# Patient Record
Sex: Male | Born: 1970
Health system: Southern US, Community
[De-identification: ages and names within clinical notes are randomized; demographics above are authoritative.]

## PROBLEM LIST (undated history)

## (undated) DIAGNOSIS — F1910 Other psychoactive substance abuse, uncomplicated: Secondary | ICD-10-CM

## (undated) DIAGNOSIS — Z91199 Patient's noncompliance with other medical treatment and regimen due to unspecified reason: Secondary | ICD-10-CM

## (undated) DIAGNOSIS — I428 Other cardiomyopathies: Secondary | ICD-10-CM

## (undated) DIAGNOSIS — E785 Hyperlipidemia, unspecified: Secondary | ICD-10-CM

## (undated) DIAGNOSIS — I10 Essential (primary) hypertension: Secondary | ICD-10-CM

## (undated) DIAGNOSIS — I2510 Atherosclerotic heart disease of native coronary artery without angina pectoris: Secondary | ICD-10-CM

## (undated) DIAGNOSIS — Z9119 Patient's noncompliance with other medical treatment and regimen: Secondary | ICD-10-CM

## (undated) DIAGNOSIS — I5022 Chronic systolic (congestive) heart failure: Secondary | ICD-10-CM

## (undated) DIAGNOSIS — I509 Heart failure, unspecified: Secondary | ICD-10-CM

## (undated) DIAGNOSIS — I639 Cerebral infarction, unspecified: Secondary | ICD-10-CM

## (undated) DIAGNOSIS — G40909 Epilepsy, unspecified, not intractable, without status epilepticus: Secondary | ICD-10-CM

## (undated) DIAGNOSIS — Z87828 Personal history of other (healed) physical injury and trauma: Secondary | ICD-10-CM

## (undated) HISTORY — DX: Morbid (severe) obesity due to excess calories: E66.01

## (undated) HISTORY — PX: CARDIAC CATHETERIZATION: SHX172

## (undated) HISTORY — DX: Cerebral infarction, unspecified: I63.9

## (undated) HISTORY — DX: Personal history of other (healed) physical injury and trauma: Z87.828

## (undated) HISTORY — DX: Chronic systolic (congestive) heart failure: I50.22

## (undated) HISTORY — DX: Hyperlipidemia, unspecified: E78.5

## (undated) HISTORY — DX: Epilepsy, unspecified, not intractable, without status epilepticus: G40.909

## (undated) HISTORY — DX: Essential (primary) hypertension: I10

## (undated) HISTORY — DX: Heart failure, unspecified: I50.9

---

## 2005-03-24 ENCOUNTER — Emergency Department (HOSPITAL_COMMUNITY): Admission: EM | Admit: 2005-03-24 | Discharge: 2005-03-24 | Payer: Self-pay | Admitting: Emergency Medicine

## 2008-12-18 ENCOUNTER — Emergency Department: Payer: Self-pay | Admitting: Unknown Physician Specialty

## 2012-10-13 ENCOUNTER — Inpatient Hospital Stay: Payer: Self-pay | Admitting: Internal Medicine

## 2012-10-13 LAB — DRUG SCREEN, URINE
Amphetamines, Ur Screen: NEGATIVE (ref ?–1000)
Benzodiazepine, Ur Scrn: NEGATIVE (ref ?–200)
Cocaine Metabolite,Ur ~~LOC~~: NEGATIVE (ref ?–300)
Opiate, Ur Screen: NEGATIVE (ref ?–300)
Phencyclidine (PCP) Ur S: NEGATIVE (ref ?–25)

## 2012-10-13 LAB — CBC
HCT: 41.6 % (ref 40.0–52.0)
MCH: 30.3 pg (ref 26.0–34.0)
MCHC: 32.2 g/dL (ref 32.0–36.0)
MCV: 94 fL (ref 80–100)
Platelet: 226 10*3/uL (ref 150–440)
RBC: 4.42 10*6/uL (ref 4.40–5.90)
RDW: 15.5 % — ABNORMAL HIGH (ref 11.5–14.5)
WBC: 8.1 10*3/uL (ref 3.8–10.6)

## 2012-10-13 LAB — COMPREHENSIVE METABOLIC PANEL
BUN: 25 mg/dL — ABNORMAL HIGH (ref 7–18)
Bilirubin,Total: 0.6 mg/dL (ref 0.2–1.0)
Calcium, Total: 7.9 mg/dL — ABNORMAL LOW (ref 8.5–10.1)
Co2: 28 mmol/L (ref 21–32)
EGFR (African American): 48 — ABNORMAL LOW
Glucose: 185 mg/dL — ABNORMAL HIGH (ref 65–99)
Osmolality: 289 (ref 275–301)
SGPT (ALT): 55 U/L (ref 12–78)

## 2012-10-13 LAB — TROPONIN I
Troponin-I: 0.09 ng/mL — ABNORMAL HIGH
Troponin-I: 0.1 ng/mL — ABNORMAL HIGH

## 2012-10-13 LAB — CK TOTAL AND CKMB (NOT AT ARMC)
CK, Total: 381 U/L — ABNORMAL HIGH (ref 35–232)
CK-MB: 7.7 ng/mL — ABNORMAL HIGH (ref 0.5–3.6)

## 2012-10-13 LAB — PRO B NATRIURETIC PEPTIDE: B-Type Natriuretic Peptide: 2366 pg/mL — ABNORMAL HIGH (ref 0–125)

## 2012-10-13 LAB — CK-MB: CK-MB: 8.9 ng/mL — ABNORMAL HIGH (ref 0.5–3.6)

## 2012-10-13 LAB — TSH: Thyroid Stimulating Horm: 2.08 u[IU]/mL

## 2012-10-13 LAB — HEMOGLOBIN A1C: Hemoglobin A1C: 6.7 % — ABNORMAL HIGH (ref 4.2–6.3)

## 2012-10-13 LAB — CK: CK, Total: 393 U/L — ABNORMAL HIGH (ref 35–232)

## 2012-10-14 LAB — URINALYSIS, COMPLETE
Bacteria: NONE SEEN
Bilirubin,UR: NEGATIVE
Blood: NEGATIVE
Hyaline Cast: 3
Ketone: NEGATIVE
Leukocyte Esterase: NEGATIVE
Ph: 5 (ref 4.5–8.0)
Protein: NEGATIVE
RBC,UR: NONE SEEN /HPF (ref 0–5)
Specific Gravity: 1.015 (ref 1.003–1.030)
WBC UR: <1 /HPF (ref 0–5)

## 2012-10-14 LAB — TROPONIN I: Troponin-I: 0.11 ng/mL — ABNORMAL HIGH

## 2012-10-14 LAB — CBC WITH DIFFERENTIAL/PLATELET
Basophil #: 0.1 10*3/uL (ref 0.0–0.1)
Basophil %: 0.7 %
Eosinophil #: 0.4 10*3/uL (ref 0.0–0.7)
Eosinophil %: 4.2 %
HCT: 42.3 % (ref 40.0–52.0)
HGB: 13.8 g/dL (ref 13.0–18.0)
Lymphocyte #: 1.5 10*3/uL (ref 1.0–3.6)
MCH: 30.7 pg (ref 26.0–34.0)
MCHC: 32.6 g/dL (ref 32.0–36.0)
MCV: 94 fL (ref 80–100)
Monocyte #: 1.2 x10 3/mm — ABNORMAL HIGH (ref 0.2–1.0)
Monocyte %: 13.2 %
RBC: 4.49 10*6/uL (ref 4.40–5.90)
RDW: 15.2 % — ABNORMAL HIGH (ref 11.5–14.5)
WBC: 8.8 10*3/uL (ref 3.8–10.6)

## 2012-10-14 LAB — BASIC METABOLIC PANEL
Anion Gap: 4 — ABNORMAL LOW (ref 7–16)
Chloride: 106 mmol/L (ref 98–107)
Glucose: 105 mg/dL — ABNORMAL HIGH (ref 65–99)
Osmolality: 286 (ref 275–301)
Potassium: 4 mmol/L (ref 3.5–5.1)
Sodium: 141 mmol/L (ref 136–145)

## 2012-10-14 LAB — LIPID PANEL
HDL Cholesterol: 44 mg/dL (ref 40–60)
Ldl Cholesterol, Calc: 123 mg/dL — ABNORMAL HIGH (ref 0–100)

## 2012-10-14 LAB — PROTEIN / CREATININE RATIO, URINE: Protein/Creat. Ratio: 77 mg/gCREAT (ref 0–200)

## 2012-10-15 LAB — PHOSPHORUS: Phosphorus: 5 mg/dL — ABNORMAL HIGH (ref 2.5–4.9)

## 2012-10-15 LAB — PROTIME-INR
INR: 1.1
Prothrombin Time: 14.6 secs (ref 11.5–14.7)

## 2013-01-27 DIAGNOSIS — E119 Type 2 diabetes mellitus without complications: Secondary | ICD-10-CM | POA: Insufficient documentation

## 2013-01-27 DIAGNOSIS — Z8673 Personal history of transient ischemic attack (TIA), and cerebral infarction without residual deficits: Secondary | ICD-10-CM | POA: Insufficient documentation

## 2013-06-19 ENCOUNTER — Emergency Department: Payer: Self-pay | Admitting: Emergency Medicine

## 2013-10-12 ENCOUNTER — Encounter: Payer: Self-pay | Admitting: *Deleted

## 2013-10-21 ENCOUNTER — Ambulatory Visit: Payer: Self-pay | Admitting: Cardiovascular Disease

## 2013-11-03 ENCOUNTER — Encounter: Payer: Self-pay | Admitting: *Deleted

## 2013-11-04 ENCOUNTER — Encounter: Payer: Self-pay | Admitting: Cardiovascular Disease

## 2013-11-04 ENCOUNTER — Ambulatory Visit (INDEPENDENT_AMBULATORY_CARE_PROVIDER_SITE_OTHER): Payer: PRIVATE HEALTH INSURANCE | Admitting: Cardiovascular Disease

## 2013-11-04 VITALS — BP 128/98 | HR 87 | Ht 72.0 in | Wt 326.0 lb

## 2013-11-04 DIAGNOSIS — R0600 Dyspnea, unspecified: Secondary | ICD-10-CM

## 2013-11-04 DIAGNOSIS — R079 Chest pain, unspecified: Secondary | ICD-10-CM

## 2013-11-04 DIAGNOSIS — I5022 Chronic systolic (congestive) heart failure: Secondary | ICD-10-CM

## 2013-11-04 DIAGNOSIS — Z7189 Other specified counseling: Secondary | ICD-10-CM

## 2013-11-04 DIAGNOSIS — R0989 Other specified symptoms and signs involving the circulatory and respiratory systems: Secondary | ICD-10-CM

## 2013-11-04 DIAGNOSIS — I10 Essential (primary) hypertension: Secondary | ICD-10-CM

## 2013-11-04 DIAGNOSIS — I509 Heart failure, unspecified: Secondary | ICD-10-CM

## 2013-11-04 DIAGNOSIS — Z7689 Persons encountering health services in other specified circumstances: Secondary | ICD-10-CM

## 2013-11-04 DIAGNOSIS — R0609 Other forms of dyspnea: Secondary | ICD-10-CM

## 2013-11-04 DIAGNOSIS — E785 Hyperlipidemia, unspecified: Secondary | ICD-10-CM

## 2013-11-04 DIAGNOSIS — G4730 Sleep apnea, unspecified: Secondary | ICD-10-CM

## 2013-11-04 MED ORDER — LOVASTATIN 20 MG PO TABS: 20.0000 mg | ORAL_TABLET | Freq: Every day | ORAL | Status: DC

## 2013-11-04 MED ORDER — ENALAPRIL MALEATE 2.5 MG PO TABS: 2.5000 mg | ORAL_TABLET | Freq: Every day | ORAL | Status: DC

## 2013-11-04 MED ORDER — HYDRALAZINE HCL 10 MG PO TABS: 10.0000 mg | ORAL_TABLET | Freq: Three times a day (TID) | ORAL | Status: DC

## 2013-11-04 MED ORDER — FUROSEMIDE 40 MG PO TABS: 40.0000 mg | ORAL_TABLET | Freq: Every day | ORAL | Status: DC

## 2013-11-04 MED ORDER — CARVEDILOL 6.25 MG PO TABS: 6.2500 mg | ORAL_TABLET | Freq: Two times a day (BID) | ORAL | Status: DC

## 2013-11-04 MED ORDER — NITROGLYCERIN 0.4 MG SL SUBL: 0.4000 mg | SUBLINGUAL_TABLET | SUBLINGUAL | Status: DC | PRN

## 2013-11-04 NOTE — Patient Instructions (Signed)
Your physician recommends that you have lab work today:  TSH  CBC BMP  Liver Profile   Your physician has requested that you have an echocardiogram. Echocardiography is a painless test that uses sound waves to create images of your heart. It provides your doctor with information about the size and shape of your heart and how well your heart's chambers and valves are working. This procedure takes approximately one hour. There are no restrictions for this procedure.  Your physician recommends that you schedule a follow-up appointment in:  1 month   You have been referred to [facility] for a sleep study.   You have been referred to Imogene Burn NP for primary care.

## 2013-11-05 ENCOUNTER — Encounter: Payer: Self-pay | Admitting: Cardiovascular Disease

## 2013-11-05 DIAGNOSIS — I10 Essential (primary) hypertension: Secondary | ICD-10-CM | POA: Insufficient documentation

## 2013-11-05 DIAGNOSIS — I5022 Chronic systolic (congestive) heart failure: Secondary | ICD-10-CM | POA: Insufficient documentation

## 2013-11-05 DIAGNOSIS — E1169 Type 2 diabetes mellitus with other specified complication: Secondary | ICD-10-CM | POA: Insufficient documentation

## 2013-11-05 DIAGNOSIS — E785 Hyperlipidemia, unspecified: Secondary | ICD-10-CM

## 2013-11-05 DIAGNOSIS — G4733 Obstructive sleep apnea (adult) (pediatric): Secondary | ICD-10-CM | POA: Insufficient documentation

## 2013-11-05 LAB — CBC WITH DIFFERENTIAL
Basophils Absolute: 0 10*3/uL (ref 0.0–0.2)
Basos: 0 %
Eos: 5 %
Eosinophils Absolute: 0.3 10*3/uL (ref 0.0–0.4)
HEMATOCRIT: 40.1 % (ref 37.5–51.0)
Hemoglobin: 13.7 g/dL (ref 12.6–17.7)
IMMATURE GRANULOCYTES: 0 %
Immature Grans (Abs): 0 10*3/uL (ref 0.0–0.1)
LYMPHS: 22 %
Lymphocytes Absolute: 1.6 10*3/uL (ref 0.7–3.1)
MCH: 32.3 pg (ref 26.6–33.0)
MCHC: 34.2 g/dL (ref 31.5–35.7)
MCV: 95 fL (ref 79–97)
MONOCYTES: 12 %
Monocytes Absolute: 0.9 10*3/uL (ref 0.1–0.9)
NEUTROS ABS: 4.6 10*3/uL (ref 1.4–7.0)
Neutrophils Relative %: 61 %
Platelets: 266 10*3/uL (ref 150–379)
RBC: 4.24 x10E6/uL (ref 4.14–5.80)
RDW: 15.1 % (ref 12.3–15.4)
WBC: 7.4 10*3/uL (ref 3.4–10.8)

## 2013-11-05 LAB — BASIC METABOLIC PANEL
BUN/Creatinine Ratio: 12 (ref 9–20)
BUN: 24 mg/dL (ref 6–24)
CHLORIDE: 100 mmol/L (ref 97–108)
CO2: 25 mmol/L (ref 18–29)
CREATININE: 1.99 mg/dL — AB (ref 0.76–1.27)
Calcium: 9 mg/dL (ref 8.7–10.2)
GFR calc non Af Amer: 40 mL/min/{1.73_m2} — ABNORMAL LOW (ref >59)
GFR, EST AFRICAN AMERICAN: 46 mL/min/{1.73_m2} — AB (ref >59)
GLUCOSE: 102 mg/dL — AB (ref 65–99)
Potassium: 4.4 mmol/L (ref 3.5–5.2)
Sodium: 141 mmol/L (ref 134–144)

## 2013-11-05 LAB — HEPATIC FUNCTION PANEL
ALBUMIN: 4 g/dL (ref 3.5–5.5)
ALK PHOS: 47 IU/L (ref 39–117)
ALT: 28 IU/L (ref 0–44)
AST: 34 IU/L (ref 0–40)
Bilirubin, Direct: 0.12 mg/dL (ref 0.00–0.40)
TOTAL PROTEIN: 6.7 g/dL (ref 6.0–8.5)
Total Bilirubin: 0.5 mg/dL (ref 0.0–1.2)

## 2013-11-05 LAB — TSH: TSH: 2.91 u[IU]/mL (ref 0.450–4.500)

## 2013-11-05 NOTE — Assessment & Plan Note (Signed)
He is currently on lovastatin. Check liver profile.

## 2013-11-05 NOTE — Assessment & Plan Note (Signed)
He might be slightly fluid overloaded. He has not had any recent labs. Thus, I requested routine labs today especially to check his kidney function before making any adjustments in his medications. I also requested an echocardiogram to evaluate LV systolic function. If ejection fraction is less than 35%, we should consider ICD placement given that he has been on medical therapy for about a year. Etiology of his cardiomyopathy is likely hypertensive heart disease. However, he will require ischemic evaluation as well.

## 2013-11-05 NOTE — Assessment & Plan Note (Signed)
Blood pressure is well controlled on current medications. 

## 2013-11-05 NOTE — Progress Notes (Signed)
HPI  This is a 43 year old African American male who is here to establish cardiovascular care. Recent obtained health insurance. He was hospitalized at [facility] in April of 2014 with congestive heart failure. He was found to have an ejection fraction of less than 20%. He was treated medically with improvement in symptoms. He reports history of a seizure disorder since he was 14, hypertension and likely sleep apnea. He is obese and has chronic kidney disease. He quit smoking in 2014. He drinks red wine very rarely. He has used marijuana and cocaine in the past but none recently He complains of chronic exertional dyspnea and orthopnea. Recently, he started having shoulder discomfort and he has been using ibuprofen frequently. He occasionally has substernal chest tightness. His girlfriend reports frequent apnea episodes at night.  No Known Allergies   No current outpatient prescriptions on file prior to visit.   No current facility-administered medications on file prior to visit.     Past Medical History  Diagnosis Date  . Seizure disorder   . History of traumatic injury of head   . Stroke   . Chronic kidney disease   . Irregular heart beat   . CHF (congestive heart failure)   . Chronic systolic heart failure April of 2014    Ejection fraction less than 20%  . Hyperlipidemia   . Hypertension   . Obesity      History reviewed. No pertinent past surgical history.   Family History  Problem Relation Age of Onset  . Diabetes Mother   . Brain cancer Mother   . Heart attack Mother   . Hypertension Father   . Diabetes Father   . Heart disease Father   . Hyperlipidemia Father   . Heart attack Father   . Heart failure Father      History   Social History  . Marital Status: Divorced    Spouse Name: N/A    Number of Children: N/A  . Years of Education: N/A   Occupational History  . Not on file.   Social History Main Topics  . Smoking status: Former Smoker -- 10 years   Types: Cigarettes  . Smokeless tobacco: Not on file  . Alcohol Use: Yes     Comment: socially  . Drug Use: No  . Sexual Activity: Not on file   Other Topics Concern  . Not on file   Social History Narrative  . No narrative on file     ROS A 10 point review of system was performed. It is negative other than that mentioned in the history of present illness.   PHYSICAL EXAM   BP 128/98  Pulse 87  Ht 6' (1.829 m)  Wt 326 lb (147.873 kg)  BMI 44.20 kg/m2 Constitutional: He is oriented to person, place, and time. He appears well-developed and well-nourished. No distress.  HENT: No nasal discharge.  Head: Normocephalic and atraumatic.  Eyes: Pupils are equal and round.  No discharge. Neck: Normal range of motion. Neck supple. No JVD present. No thyromegaly present.  Cardiovascular: Normal rate, regular rhythm, normal heart sounds. Exam reveals no gallop and no friction rub. No murmur heard.  Pulmonary/Chest: Effort normal and breath sounds normal. No stridor. No respiratory distress. He has no wheezes. He has no rales. He exhibits no tenderness.  Abdominal: Soft. Bowel sounds are normal. He exhibits no distension. There is no tenderness. There is no rebound and no guarding.  Musculoskeletal: Normal range of motion. He exhibits no edema and  no tenderness.  Neurological: He is alert and oriented to person, place, and time. Coordination normal.  Skin: Skin is warm and dry. No rash noted. He is not diaphoretic. No erythema. No pallor.  Psychiatric: He has a normal mood and affect. His behavior is normal. Judgment and thought content normal.       [id number]  Rhythm  -  Nonspecific T-abnormality.   ABNORMAL    ASSESSMENT AND PLAN

## 2013-11-05 NOTE — Assessment & Plan Note (Signed)
He has classic symptoms of obstructive sleep apnea which is likely contributing to his heart failure and hypotension. I am referring him to Dr. Freda Munro for evaluation.

## 2013-11-07 ENCOUNTER — Telehealth: Payer: Self-pay | Admitting: *Deleted

## 2013-11-07 DIAGNOSIS — N289 Disorder of kidney and ureter, unspecified: Secondary | ICD-10-CM

## 2013-11-07 NOTE — Telephone Encounter (Signed)
Message copied by Fransico Setters on Mon Nov 07, 2013  9:07 AM ------      Message from: Lorine Bears A      Created: Sat Nov 05, 2013  9:45 AM       Inform patient that labs were normal except for kidney failure. Refer to Dr. Cherylann Ratel for evaluation. ------

## 2013-11-08 ENCOUNTER — Telehealth: Payer: Self-pay | Admitting: *Deleted

## 2013-11-08 NOTE — Telephone Encounter (Signed)
Left voice mail with patient appt info  Asked patient to call and confirm

## 2013-11-08 NOTE — Telephone Encounter (Signed)
Attempted to call patient to inform him that his appt with Dr.Saadat Welton Flakes for sleep study is 11/10/13 at 10:30 No Answer  No voicemail

## 2013-11-09 NOTE — Telephone Encounter (Signed)
LVM 5/6

## 2013-11-09 NOTE — Telephone Encounter (Signed)
 [facility] recd referral and info  They are trying to contact patient to set up appt

## 2013-11-11 NOTE — Telephone Encounter (Signed)
Left voicemail asking patient to call and let us know if he went to his sleep study appt with Dr. Welton Flakes on 5/7

## 2013-11-17 ENCOUNTER — Other Ambulatory Visit: Payer: PRIVATE HEALTH INSURANCE

## 2013-11-24 ENCOUNTER — Ambulatory Visit: Payer: PRIVATE HEALTH INSURANCE | Admitting: Adult Health

## 2013-12-02 ENCOUNTER — Telehealth: Payer: Self-pay | Admitting: *Deleted

## 2013-12-02 NOTE — Telephone Encounter (Signed)
LVM 5/29 

## 2013-12-08 ENCOUNTER — Ambulatory Visit: Payer: PRIVATE HEALTH INSURANCE | Admitting: Cardiovascular Disease

## 2013-12-15 ENCOUNTER — Encounter: Payer: Self-pay | Admitting: *Deleted

## 2013-12-15 ENCOUNTER — Ambulatory Visit: Payer: PRIVATE HEALTH INSURANCE | Admitting: Cardiovascular Disease

## 2014-01-13 ENCOUNTER — Other Ambulatory Visit: Payer: Self-pay

## 2014-01-13 MED ORDER — FUROSEMIDE 40 MG PO TABS: 40.0000 mg | ORAL_TABLET | Freq: Every day | ORAL | Status: DC

## 2014-01-13 MED ORDER — HYDRALAZINE HCL 10 MG PO TABS: 10.0000 mg | ORAL_TABLET | Freq: Three times a day (TID) | ORAL | Status: DC

## 2014-01-13 MED ORDER — LOVASTATIN 20 MG PO TABS: 20.0000 mg | ORAL_TABLET | Freq: Every day | ORAL | Status: DC

## 2014-01-13 MED ORDER — ENALAPRIL MALEATE 2.5 MG PO TABS: 2.5000 mg | ORAL_TABLET | Freq: Every day | ORAL | Status: DC

## 2014-02-20 ENCOUNTER — Other Ambulatory Visit: Payer: Self-pay

## 2014-02-20 MED ORDER — CARVEDILOL 6.25 MG PO TABS: 6.2500 mg | ORAL_TABLET | Freq: Two times a day (BID) | ORAL | Status: DC

## 2014-03-02 ENCOUNTER — Encounter: Payer: Self-pay | Admitting: *Deleted

## 2014-03-02 ENCOUNTER — Ambulatory Visit: Payer: PRIVATE HEALTH INSURANCE | Admitting: Cardiovascular Disease

## 2014-03-20 ENCOUNTER — Emergency Department: Payer: Self-pay | Admitting: Emergency Medicine

## 2014-05-26 ENCOUNTER — Other Ambulatory Visit: Payer: Self-pay

## 2014-06-21 ENCOUNTER — Emergency Department: Payer: Self-pay | Admitting: Emergency Medicine

## 2014-06-21 LAB — PROTIME-INR
INR: 1.1
Prothrombin Time: 13.6 secs (ref 11.5–14.7)

## 2014-06-21 LAB — CBC WITH DIFFERENTIAL/PLATELET
BASOS ABS: 0 10*3/uL (ref 0.0–0.1)
BASOS PCT: 0.5 %
Eosinophil #: 0.3 10*3/uL (ref 0.0–0.7)
Eosinophil %: 3.1 %
HCT: 43.5 % (ref 40.0–52.0)
HGB: 14.3 g/dL (ref 13.0–18.0)
LYMPHS ABS: 1.4 10*3/uL (ref 1.0–3.6)
Lymphocyte %: 14.9 %
MCH: 32.2 pg (ref 26.0–34.0)
MCHC: 32.7 g/dL (ref 32.0–36.0)
MCV: 98 fL (ref 80–100)
MONO ABS: 0.9 x10 3/mm (ref 0.2–1.0)
Monocyte %: 9.3 %
Neutrophil #: 7 10*3/uL — ABNORMAL HIGH (ref 1.4–6.5)
Neutrophil %: 72.2 %
Platelet: 219 10*3/uL (ref 150–440)
RBC: 4.42 10*6/uL (ref 4.40–5.90)
RDW: 15.2 % — ABNORMAL HIGH (ref 11.5–14.5)
WBC: 9.7 10*3/uL (ref 3.8–10.6)

## 2014-06-21 LAB — COMPREHENSIVE METABOLIC PANEL
ALK PHOS: 55 U/L
AST: 42 U/L — AB (ref 15–37)
Albumin: 3.4 g/dL (ref 3.4–5.0)
Anion Gap: 3 — ABNORMAL LOW (ref 7–16)
BUN: 23 mg/dL — AB (ref 7–18)
Bilirubin,Total: 0.7 mg/dL (ref 0.2–1.0)
CO2: 32 mmol/L (ref 21–32)
CREATININE: 1.99 mg/dL — AB (ref 0.60–1.30)
Calcium, Total: 8.7 mg/dL (ref 8.5–10.1)
Chloride: 103 mmol/L (ref 98–107)
EGFR (African American): 47 — ABNORMAL LOW
GFR CALC NON AF AMER: 39 — AB
Glucose: 92 mg/dL (ref 65–99)
Osmolality: 279 (ref 275–301)
Potassium: 4.2 mmol/L (ref 3.5–5.1)
SGPT (ALT): 36 U/L
SODIUM: 138 mmol/L (ref 136–145)
Total Protein: 7.5 g/dL (ref 6.4–8.2)

## 2014-06-21 LAB — TROPONIN I
TROPONIN-I: 0.14 ng/mL — AB
Troponin-I: 0.13 ng/mL — ABNORMAL HIGH

## 2014-06-21 LAB — APTT: Activated PTT: 28.1 secs (ref 23.6–35.9)

## 2014-08-30 ENCOUNTER — Inpatient Hospital Stay: Payer: Self-pay | Admitting: Internal Medicine

## 2014-08-30 ENCOUNTER — Telehealth: Payer: Self-pay | Admitting: *Deleted

## 2014-08-30 ENCOUNTER — Emergency Department: Payer: Self-pay | Admitting: Emergency Medicine

## 2014-08-30 NOTE — Telephone Encounter (Signed)
I spoke with the patient He states that around 4 pm yesterday he started to have sharp pain in his left arm, dizziness, and vomiting. He was at work and left there. He went home and took an ASA. He developed arm numbness and tingling to his fingers. His pain gradually eased off until it resolved around 6:30/ 7:00 pm. He reports that his symptoms started again around 4 am with the left arm pain and some dizziness. He reports he has been to the ER several times recently and they just give him and ASA. No documented history of MI, but his last documented EF was ~ 20% in April 2014. He was scheduled to have a repeat echo in May 2015, but this was cancelled and not rescheduled. I asked the patient if he has had any recent drug use. He states he last used Marijuana on Superbowl Sunday. He has started smoking again sometime last year. He has a history of seizures and feels that he does not have as many when he smokes. Last seizure was in December 2015.He reports some alcohol use. I have advised the patient that based on his current symptoms, he should report to the ER for immediate evaluation. I have advised if he is not admitted, he should call us back and schedule a follow up with Dr. Kirke Corin and he needs a repeat echo to evaluate his EF. He verbalizes understanding.

## 2014-08-30 NOTE — Telephone Encounter (Signed)
Pt is calling stating that yesterday after work that he had tingling in fingers, left arm had sharp pains. Could not even lift it.  Dizzy and SOB. Has taken all med's but is worried that may need to go ED This morning seems to be coming back. n Needs to know if he should go ED. Please advise.

## 2014-08-31 ENCOUNTER — Other Ambulatory Visit: Payer: Self-pay | Admitting: Cardiovascular Disease

## 2014-08-31 DIAGNOSIS — R7989 Other specified abnormal findings of blood chemistry: Secondary | ICD-10-CM

## 2014-08-31 DIAGNOSIS — I340 Nonrheumatic mitral (valve) insufficiency: Secondary | ICD-10-CM

## 2014-08-31 DIAGNOSIS — I5022 Chronic systolic (congestive) heart failure: Secondary | ICD-10-CM

## 2014-08-31 DIAGNOSIS — I10 Essential (primary) hypertension: Secondary | ICD-10-CM

## 2014-09-01 ENCOUNTER — Telehealth: Payer: Self-pay | Admitting: *Deleted

## 2014-09-01 DIAGNOSIS — R079 Chest pain, unspecified: Secondary | ICD-10-CM

## 2014-09-01 NOTE — Telephone Encounter (Signed)
Dr.Gollan gave verbal orders to schedule a cardiac cath with Dr. Kirke Corin on 09/08/14 Patient scheduled for 09/08/14 at 0930 am  Patient to arrive at 0730 am for pre hydration  Dr. Mariah Milling informed of date and time so he can inform inpatient  Dr. Mariah Milling to review instructions with patient  No labs or cxr needed per Dr. Mariah Milling

## 2014-09-07 NOTE — Telephone Encounter (Signed)
Cath orders faxed to cath lab  Angie conformed receipt   LVM to inform patient of date and time instructions

## 2014-09-08 ENCOUNTER — Ambulatory Visit: Payer: Self-pay | Admitting: Cardiovascular Disease

## 2014-09-08 DIAGNOSIS — R079 Chest pain, unspecified: Secondary | ICD-10-CM | POA: Diagnosis not present

## 2014-09-13 ENCOUNTER — Encounter: Payer: Self-pay | Admitting: Cardiovascular Disease

## 2014-09-15 ENCOUNTER — Encounter: Payer: Self-pay | Admitting: Cardiovascular Disease

## 2014-09-15 ENCOUNTER — Encounter: Payer: BLUE CROSS/BLUE SHIELD | Admitting: Cardiovascular Disease

## 2014-09-19 ENCOUNTER — Encounter: Payer: BLUE CROSS/BLUE SHIELD | Admitting: Cardiovascular Disease

## 2014-09-19 ENCOUNTER — Encounter: Payer: Self-pay | Admitting: *Deleted

## 2014-10-03 ENCOUNTER — Ambulatory Visit (INDEPENDENT_AMBULATORY_CARE_PROVIDER_SITE_OTHER): Payer: BLUE CROSS/BLUE SHIELD | Admitting: Cardiovascular Disease

## 2014-10-03 ENCOUNTER — Encounter: Payer: Self-pay | Admitting: Cardiovascular Disease

## 2014-10-03 VITALS — BP 114/80 | HR 84 | Ht 72.0 in | Wt 329.5 lb

## 2014-10-03 DIAGNOSIS — G4730 Sleep apnea, unspecified: Secondary | ICD-10-CM | POA: Diagnosis not present

## 2014-10-03 DIAGNOSIS — I10 Essential (primary) hypertension: Secondary | ICD-10-CM | POA: Diagnosis not present

## 2014-10-03 DIAGNOSIS — I5022 Chronic systolic (congestive) heart failure: Secondary | ICD-10-CM

## 2014-10-03 DIAGNOSIS — R079 Chest pain, unspecified: Secondary | ICD-10-CM

## 2014-10-03 MED ORDER — FUROSEMIDE 40 MG PO TABS: 40.0000 mg | ORAL_TABLET | Freq: Two times a day (BID) | ORAL | Status: DC

## 2014-10-03 MED ORDER — ASPIRIN EC 81 MG PO TBEC: 81.0000 mg | DELAYED_RELEASE_TABLET | Freq: Every day | ORAL | Status: DC

## 2014-10-03 MED ORDER — ENALAPRIL MALEATE 5 MG PO TABS: 5.0000 mg | ORAL_TABLET | Freq: Every day | ORAL | Status: DC

## 2014-10-03 NOTE — Assessment & Plan Note (Signed)
I suspect that he has significant sleep apnea which is likely contributing to his heart failure symptoms. I requested a sleep study.

## 2014-10-03 NOTE — Assessment & Plan Note (Signed)
This is due to nonischemic cardiomyopathy with most recent ejection fraction of 15%. He is currently New York Heart Association class III. LVEDP was significantly elevated during cardiac catheterization. Symptoms improved after increasing the dose of Lasix to 40 mg twice daily. I requested basic metabolic profile today. I stopped hydralazine and increase enalapril to 5 mg once daily. Spironolactone is not recommended due to chronic kidney disease. If renal function improves, this can be considered.

## 2014-10-03 NOTE — Assessment & Plan Note (Signed)
Blood pressure is controlled on current medications. 

## 2014-10-03 NOTE — Progress Notes (Signed)
HPI  This is a 44 year old African American male who is here today for a follow-up visit regarding chronic systolic heart failure due to nonischemic cardiomyopathy. He was hospitalized at [facility] in April of 2014 with congestive heart failure. He was found to have an ejection fraction of less than 20%. He was treated medically with improvement in symptoms. He reports history of a seizure disorder since he was 14, hypertension and likely sleep apnea. He is obese and has chronic kidney disease. He quit smoking in 2014. He drinks red wine very rarely. He has used marijuana and cocaine in the past but none recently He had recurrent presentation with chest pain. Thus, I proceeded with cardiac catheterization last month which showed minor luminal irregularities without evidence of obstructive coronary artery disease. Left ventricular angiography was not performed due to chronic kidney disease. Left ventricular end-diastolic pressure was 30 mmHg. He was noted to have significant apnea episodes during cardiac cath with bradycardia. I increased his dose of Lasix to 40 mg twice daily. He reports improvement in symptoms.   No Known Allergies   Current Outpatient Prescriptions on File Prior to Visit  Medication Sig Dispense Refill  . carvedilol (COREG) 6.25 MG tablet Take 1 tablet (6.25 mg total) by mouth 2 (two) times daily with a meal. 60 tablet 6  . enalapril (VASOTEC) 2.5 MG tablet Take 1 tablet (2.5 mg total) by mouth daily. 60 tablet 3  . furosemide (LASIX) 40 MG tablet Take 1 tablet (40 mg total) by mouth daily. (Patient taking differently: Take 40 mg by mouth 2 (two) times daily. ) 30 tablet 3  . hydrALAZINE (APRESOLINE) 10 MG tablet Take 1 tablet (10 mg total) by mouth 3 (three) times daily. 90 tablet 3  . lovastatin (MEVACOR) 20 MG tablet Take 1 tablet (20 mg total) by mouth at bedtime. 30 tablet 3   No current facility-administered medications on file prior to visit.     Past Medical History    Diagnosis Date  . Seizure disorder   . History of traumatic injury of head   . Stroke   . Chronic kidney disease   . Irregular heart beat   . CHF (congestive heart failure)   . Chronic systolic heart failure April of 2014    Ejection fraction less than 20%  . Hyperlipidemia   . Hypertension   . Obesity      Past Surgical History  Procedure Laterality Date  . Cardiac catheterization      [facility]     Family History  Problem Relation Age of Onset  . Diabetes Mother   . Brain cancer Mother   . Heart attack Mother   . Hypertension Father   . Diabetes Father   . Heart disease Father   . Hyperlipidemia Father   . Heart attack Father   . Heart failure Father      History   Social History  . Marital Status: Single    Spouse Name: N/A  . Number of Children: N/A  . Years of Education: N/A   Occupational History  . Not on file.   Social History Main Topics  . Smoking status: Former Smoker -- 10 years    Types: Cigarettes  . Smokeless tobacco: Not on file  . Alcohol Use: Yes     Comment: socially  . Drug Use: No  . Sexual Activity: Not on file   Other Topics Concern  . Not on file   Social History Narrative  ROS A 10 point review of system was performed. It is negative other than that mentioned in the history of present illness.   PHYSICAL EXAM   BP 114/80 mmHg  Pulse 84  Ht 6' (1.829 m)  Wt 329 lb 8 oz (149.46 kg)  BMI 44.68 kg/m2 Constitutional: He is oriented to person, place, and time. He appears well-developed and well-nourished. No distress.  HENT: No nasal discharge.  Head: Normocephalic and atraumatic.  Eyes: Pupils are equal and round.  No discharge. Neck: Normal range of motion. Neck supple. No JVD present. No thyromegaly present.  Cardiovascular: Normal rate, regular rhythm, normal heart sounds. Exam reveals no gallop and no friction rub. No murmur heard.  Pulmonary/Chest: Effort normal and breath sounds normal. No stridor. No  respiratory distress. He has no wheezes. He has no rales. He exhibits no tenderness.  Abdominal: Soft. Bowel sounds are normal. He exhibits no distension. There is no tenderness. There is no rebound and no guarding.  Musculoskeletal: Normal range of motion. He exhibits no edema and no tenderness.  Neurological: He is alert and oriented to person, place, and time. Coordination normal.  Skin: Skin is warm and dry. No rash noted. He is not diaphoretic. No erythema. No pallor.  Psychiatric: He has a normal mood and affect. His behavior is normal. Judgment and thought content normal.  No groin hematoma. Radial pulses are normal bilaterally.     [id number]  Rhythm  -  Nonspecific T-abnormality.   ABNORMAL     ASSESSMENT AND PLAN

## 2014-10-03 NOTE — Patient Instructions (Signed)
Stop Hydralazine.  Increase Enalapril to 5 mg once daily.  Decrease Aspirin to 81 mg once daily.  Continue lasix 40 mg twice daily.   Labs today.   Order sleep study at [facility] to be read by Dr. Vassie Loll.   Follow up in 1 month.

## 2014-10-04 LAB — BASIC METABOLIC PANEL
BUN / CREAT RATIO: 11 (ref 9–20)
BUN: 19 mg/dL (ref 6–24)
CALCIUM: 9 mg/dL (ref 8.7–10.2)
CO2: 23 mmol/L (ref 18–29)
Chloride: 103 mmol/L (ref 97–108)
Creatinine, Ser: 1.69 mg/dL — ABNORMAL HIGH (ref 0.76–1.27)
GFR calc Af Amer: 55 mL/min/{1.73_m2} — ABNORMAL LOW (ref >59)
GFR calc non Af Amer: 48 mL/min/{1.73_m2} — ABNORMAL LOW (ref >59)
Glucose: 120 mg/dL — ABNORMAL HIGH (ref 65–99)
Potassium: 4.2 mmol/L (ref 3.5–5.2)
Sodium: 145 mmol/L — ABNORMAL HIGH (ref 134–144)

## 2014-10-16 ENCOUNTER — Encounter: Payer: Self-pay | Admitting: *Deleted

## 2014-10-25 ENCOUNTER — Telehealth: Payer: Self-pay

## 2014-10-25 MED ORDER — ENALAPRIL MALEATE 5 MG PO TABS
5.0000 mg | ORAL_TABLET | Freq: Every day | ORAL | Status: DC
Start: 2014-10-25 — End: 2014-10-27

## 2014-10-25 NOTE — Telephone Encounter (Signed)
Pt called back, will come in 10/26/2014 @2 :00

## 2014-10-25 NOTE — Telephone Encounter (Signed)
Yes, if it is in the afternoon. Am is no good.

## 2014-10-25 NOTE — Telephone Encounter (Signed)
Can have patient come over for a nurse visit. I can come assess the cath site when he arrives. Please let me know upon his arrival. He should follow up with PCP for stomach issues.

## 2014-10-25 NOTE — Telephone Encounter (Signed)
Pt called after receiving letter re his labs; reviewed w/ pt.  Pt reports that he needs a new rx for enalapril sent in, as he has been taking 5 mg BID instead of QD as instructed.  Reviewed pt's AVS w/ him and he verbalizes understanding. Pt states that at his last ov, he reported that his incision site was healing well, but he states that he feels that it is getting worse. Reports a sharp pain that will shoot through his stomach and almost double him over.  Denies any drainage, but he is unable to tell me if there is any heat or redness, as he cannot see the site due to his size. He would like to make Dr. Kirke Corin aware and see if he has any recommendations.

## 2014-10-25 NOTE — Telephone Encounter (Signed)
Spoke w/ pt.  Asked him to come in for nurse visit, but he states that he does not have a way to get here.  He would like to know if someone can see him if he is able to come over tomorrow.

## 2014-10-25 NOTE — Telephone Encounter (Signed)
The stomach is likely not related and should follow with his PCP about that.  The cath was more than a month ago but he should come for a nurse visit to check the cath site. With Alycia Rossetti is fine if available.

## 2014-10-25 NOTE — Telephone Encounter (Signed)
Attempted to contact pt.  No answer, no vm at either #.

## 2014-10-26 ENCOUNTER — Ambulatory Visit (INDEPENDENT_AMBULATORY_CARE_PROVIDER_SITE_OTHER): Payer: BLUE CROSS/BLUE SHIELD | Admitting: Physician Assistant

## 2014-10-26 VITALS — BP 122/84 | Ht 72.0 in | Wt 326.0 lb

## 2014-10-26 DIAGNOSIS — R1030 Lower abdominal pain, unspecified: Secondary | ICD-10-CM | POA: Diagnosis not present

## 2014-10-26 DIAGNOSIS — R1031 Right lower quadrant pain: Secondary | ICD-10-CM | POA: Diagnosis not present

## 2014-10-26 MED ORDER — HYDROCODONE-ACETAMINOPHEN 5-325 MG PO TABS: 1.0000 | ORAL_TABLET | Freq: Four times a day (QID) | ORAL | Status: DC | PRN

## 2014-10-26 NOTE — Progress Notes (Signed)
Cardiology Office Note:  Date of Encounter: 10/26/2014  ID: Kent Geralds., DOB 07-14-1970, MRN [id number]  PCP:  No PCP Per Patient Primary Cardiologist:  Dr. Kirke Corin, MD  Chief Complaint  Patient presents with  . other    Pt c/o swelling/ pain at lower abdomen.     HPI:  44 year old male with history of chronic systolic CHF 2/2 NICM. During prior hospitalization 10/2012 at [facility] he was found to have an EF of less than 20%. Treated medically with improvement in symptoms. He also notes history of seizure disorder since age 31, HTN, polysubstance abuse including prior cocaine abuse, MJ, tobacco (quit in 2014) and red wine, HTN, and possible sleep apnea.   He was hospitalized at [facility] 08/2014 with recurrent chest pain/ Troponin 0.13-->0.16-->0.13. Cardiac cath showed minor luminal irregularities without evidence of obstructive CAD. Left ventricular angiography was not performed due to chronic kidney disease. Left ventricular end-diastolic pressure was 30 mmHg. He was noted to have significant apnea episodes during cardiac cath with bradycardia. Echo 08/2014 showed EF <20%, mild LVH, diastolic dysfunction, severely dilated left atrium, moderately dilated right atrium, moderate mitral regurgitation, mild tricuspid regurgitation. Lasix was increased to 40 mg bid with improvement in symptoms.   He presents today stating he has intermittent suprapubic discomfort x 3 weeks. Afebrile. No chills. Discomfort is described as a sharp shooting pain. Better with movement. No associated nausea, vomiting, diarrhea. No chest pain, SOB, palpitations. Cath site without erythema, swelling, warmth, drainage, or tenderness.      Past Medical History  Diagnosis Date  . Seizure disorder   . History of traumatic injury of head   . Stroke   . Chronic kidney disease   . Irregular heart beat   . CHF (congestive heart failure)   . Chronic systolic heart failure April of 2014    Ejection fraction less than 20%    . Hyperlipidemia   . Hypertension   . Obesity   :  Past Surgical History  Procedure Laterality Date  . Cardiac catheterization      [facility]  :  Social History:  The patient  reports that he has quit smoking. His smoking use included Cigarettes. He quit after 10 years of use. He does not have any smokeless tobacco history on file. He reports that he drinks alcohol. He reports that he does not use illicit drugs.   Family History  Problem Relation Age of Onset  . Diabetes Mother   . Brain cancer Mother   . Heart attack Mother   . Hypertension Father   . Diabetes Father   . Heart disease Father   . Hyperlipidemia Father   . Heart attack Father   . Heart failure Father      Allergies:  No Known Allergies   Home Medications:  Current Outpatient Prescriptions  Medication Sig Dispense Refill  . aspirin EC 81 MG tablet Take 1 tablet (81 mg total) by mouth daily. 90 tablet 3  . carvedilol (COREG) 6.25 MG tablet Take 1 tablet (6.25 mg total) by mouth 2 (two) times daily with a meal. 60 tablet 6  . enalapril (VASOTEC) 5 MG tablet Take 1 tablet (5 mg total) by mouth daily. 30 tablet 6  . furosemide (LASIX) 40 MG tablet Take 1 tablet (40 mg total) by mouth 2 (two) times daily. 60 tablet 6  . HYDROcodone-acetaminophen (NORCO) 5-325 MG per tablet Take 1 tablet by mouth every 6 (six) hours as needed for moderate pain.  20 tablet 0  . lovastatin (MEVACOR) 20 MG tablet Take 1 tablet (20 mg total) by mouth at bedtime. 30 tablet 3   No current facility-administered medications for this visit.     Review of Systems:  Review of Systems  Constitutional: Negative for fever, chills, weight loss, malaise/fatigue and diaphoresis.  Eyes: Negative for blurred vision.  Respiratory: Negative for cough, hemoptysis, shortness of breath and wheezing.   Cardiovascular: Negative for chest pain, palpitations, orthopnea, claudication, leg swelling and PND.  Gastrointestinal: Positive for abdominal pain.  Negative for heartburn, nausea, vomiting, diarrhea and constipation.  Genitourinary: Negative for dysuria, urgency, frequency, hematuria and flank pain.  Musculoskeletal: Negative for myalgias.  Skin: Negative for itching and rash.  Neurological: Negative for weakness and headaches.     Physical Exam:  Blood pressure 122/84, height 6' (1.829 m), weight 326 lb (147.873 kg). Body mass index is 44.2 kg/(m^2). General: Pleasant, NAD Psych: Normal affect. Neuro: Alert and oriented X 3. Moves all extremities spontaneously. HEENT: Normal  Neck: Supple without JVD. Lungs:  Resp regular and unlabored. Heart: RRR no s3, s4, or murmurs. Abdomen: Abdomen is soft and not distended. BS x 4. He is TTP suprapubically without rebound. Negative McBurney's, Iliopsoas, and Rovsing's. Able to lay supine and return to seated position without any difficulty. Femoral pulse 2+. Cath site well healed without any erythema, swelling, warmth, TTP, or bruits. Compared to the contralateral side there does not appear to be any obvious swelling.   Extremities: No clubbing, cyanosis or edema. DP/PT/Radials 2+ and equal bilaterally.   Accessory Clinical Findings:  EKG - not performed today as this was a nurse visit for a groin check only  Other studies Reviewed: Additional studies/ records that were reviewed today include: [facility] notes.   Recent Labs: 11/04/2013: ALT 28; Hemoglobin 13.7; Platelets 266; TSH 2.910 10/03/2014: BUN 19; Creatinine 1.69*; Potassium 4.2; Sodium 145*    Weights: Wt Readings from Last 3 Encounters:  10/26/14 326 lb (147.873 kg)  10/03/14 329 lb 8 oz (149.46 kg)  11/04/13 326 lb (147.873 kg)     Assessment & Plan:  1. Abdominal pain: -Discomfort is along suprapubic region and appears to be of a possible soft tissue etiology vs GI vs urological  -External skin surface does not exhibit any signs of infection  -Cath site unremarkable, fully healed  -Check CBC -Follow up with PCP, names  and numbers were given -Scheduled appointment with new PCP for patient -At end of visit patient asked for pain medication, given his polysubstance abuse this could be a potential red flag, moving forward  -Norco 5/325 #20 1 po q 6 hours severe pain no RF, was given as a one time Rx     Dispo: -Regularly scheduled follow up with Dr. Kirke Corin, MD   Eula Listen, PA-C [facility] [address] [place], [place] [address] [phone] [facility] 10/26/2014, 3:06 PM

## 2014-10-26 NOTE — Patient Instructions (Addendum)
We will draw labs today: CBC  You are scheduled to see Dr. Carolin Coy [facility] [address] [place] [address] [phone] On May 6 @ 1:30  Call or return to clinic prn if these symptoms worsen or fail to improve as anticipated.

## 2014-10-27 ENCOUNTER — Other Ambulatory Visit: Payer: Self-pay | Admitting: *Deleted

## 2014-10-27 ENCOUNTER — Telehealth: Payer: Self-pay | Admitting: *Deleted

## 2014-10-27 LAB — CBC WITH DIFFERENTIAL/PLATELET
BASOS: 0 %
Basophils Absolute: 0 10*3/uL (ref 0.0–0.2)
EOS: 3 %
Eosinophils Absolute: 0.2 10*3/uL (ref 0.0–0.4)
HCT: 41.3 % (ref 37.5–51.0)
HEMOGLOBIN: 14.2 g/dL (ref 12.6–17.7)
IMMATURE GRANS (ABS): 0 10*3/uL (ref 0.0–0.1)
IMMATURE GRANULOCYTES: 0 %
Lymphocytes Absolute: 1.4 10*3/uL (ref 0.7–3.1)
Lymphs: 20 %
MCH: 32.4 pg (ref 26.6–33.0)
MCHC: 34.4 g/dL (ref 31.5–35.7)
MCV: 94 fL (ref 79–97)
Monocytes Absolute: 0.7 10*3/uL (ref 0.1–0.9)
Monocytes: 10 %
NEUTROS PCT: 67 %
Neutrophils Absolute: 4.6 10*3/uL (ref 1.4–7.0)
Platelets: 256 10*3/uL (ref 150–379)
RBC: 4.38 x10E6/uL (ref 4.14–5.80)
RDW: 15 % (ref 12.3–15.4)
WBC: 6.8 10*3/uL (ref 3.4–10.8)

## 2014-10-27 MED ORDER — ENALAPRIL MALEATE 5 MG PO TABS: 5.0000 mg | ORAL_TABLET | Freq: Every day | ORAL | Status: DC

## 2014-10-27 NOTE — Discharge Summary (Signed)
PATIENT NAME:  Kent Stanley, Kent Stanley MR#:  [id number] DATE OF BIRTH:  Nov 13, 1970  DATE OF ADMISSION:  10/13/2012 DATE OF DISCHARGE:  10/15/2012  PRESENTING COMPLAINT:  Leg swelling and shortness of breath.   DISCHARGE DIAGNOSES: 1.  Acute congestive heart failure, systolic with ejection fraction of less than 20%.  2.  Chronic seizure disorder. 3.  Right cerebellar acute cerebrovascular accident.  No neurologic deficits.  4.  Hyperglycemia/impaired glucose tolerance.  5.  Chronic kidney disease, stage II. 6.  Morbid obesity.   CODE STATUS:  FULL CODE.   MEDICATIONS: 1.  Enalapril 2.5 mg daily.  2.  Carbamazepine 200 mg twice a day.  3.  Lovastatin 20 mg daily.  4.  Aspirin 81 mg daily.  5.  Lasix 40 mg daily.  6.  Hydralazine 10 mg 3 times a day.  7.  Mesotrypsin. 8.  Triple antibiotic one application topically every other day.  9.  Coreg 6.25 b.i.d.  HOME OXYGEN:  None.   Low-sodium, low-fat, low-cholesterol, carbohydrate-controlled 1800 calorie diet.   FOLLOWUP: 1.  Follow up with Dr. Wynelle Link 11/05/2012. 2.  Follow up with Dr. Sherryll Burger 11/09/2012.  3.  Follow up with Dr. Darrold Junker 10/27/2012.  4.  Follow up with  [facility] on04/17/2014, primary care physician.  Phosphorus 5.0, PT-INR 14.6 and 1.1. Urine creatinine is 91.3 MRI. UA negative for UTI. Urine protein is negative.  CBC within normal limits.  Creatinine is 2.11.  LDL is 123. Cholesterol is 183. Troponin is 0.11, 0.10.  EKG shows normal sinus rhythm, left ventricular hypertrophy.   Chest x-ray consistent with CHF and pulmonary edema. Urine drug screen negative. B-type nitro peptide is 399. Hemoglobin A1c is 6.7. TSH is 2.08.  Ultrasound of the kidneys shows no evidence of obstruction of either kidney.  Echotexture on the right is mildly increased, reflects medical renal disease.  Echo Doppler showed EF of less than 20% severity global, decreased left ventricular systolic function, moderate to severely  increased left ventricular internal cavity size, mild dilated left atrium, mild dilated right atrium, moderate to severe MR, moderate to severe TR.  MRI of the brain shows small acute infarct in the right cerebellum.  PROCEDURE:  EEG negative for any epileptiform activity.  BRIEF SUMMARY OF HOSPITAL COURSE: The patient  is a 44 year old African American gentleman with past medical history of hypertension, comes in with: 1.  Acute congestive heart failure, systolic, with severe cardiomyopathy:  EF of 20% by echo. The patient presented with increasing shortness of breath and leg swelling. His CHF appears to be multifactorial, untreated hypertension, untreated suspected sleep apnea and significant alcohol abuse. The patient was started on IV Lasix. He diuresed well. He was changed to p.o. Lasix. He is on beta blockers. Low-dose Coreg was started, along with hydralazine and small dose of enalapril was started as well. The patient was seen by Dr. Darrold Junker.  His elevated troponin was likely demand ischemia. He will follow up with Dr. Darrold Junker as outpatient and discuss possibility of having AICD placement.  2.  Hypertension:  Low-dose Coreg, hydralazine and ACE inhibitors were given.  3.  Obstructive sleep apnea:  The patient will get formal nocturnal oximetry study done as outpatient once he gets his new insurance.  4.  Obesity:  Dietary consultation was done while the patient was in-house.  5.  Renal failure:  The patient likely has CKD given above comorbidities; however, he came in with elevated creatinine, seen by Dr. Thedore Mins and it appears to be  CKD secondary to untreated hypertension and cardiomyopathy.  6.  Tobacco abuse and alcohol abuse:  Cessation was discussed. The patient does agree on substance abuse cessation.  TIME SPENT:  40 minutes.   ____________________________ Wylie Hail Allena Katz, MD sap:ce D: 10/15/2012 17:04:31 ET T: 10/16/2012 12:53:59 ET JOB#: [id number]  cc: Keiland Pickering A. Allena Katz, MD,  <Dictator>  Dr. Wynelle Link  Dr. Sherryll Burger  Dr. Graylon Gunning MD ELECTRONICALLY SIGNED 10/25/2012 7:02

## 2014-10-27 NOTE — Consult Note (Signed)
PATIENT NAME:  Kent Stanley, Kent Stanley MR#:  [id number] DATE OF BIRTH:  08-Nov-1970  DATE OF CONSULTATION:  10/13/2012  REFERRING PHYSICIAN:  Katharina Caper, MD   CONSULTING PHYSICIAN:  Marcina Millard, MD  CHIEF COMPLAINT: Swelling.   REASON FOR CONSULTATION: Consultation requested for evaluation of congestive heart failure and elevated troponin.   HISTORY OF PRESENT ILLNESS: The patient is a 44 year old gentleman who presents with a chief complaint of fluid retention, pedal edema and shortness of breath. The patient reports he experienced pneumonia in 05/2012, treated with antibiotics. Ever since then, the patient has noted progressive fluid retention with pedal edema with recent episodes of shortness of breath with exertion which has progressed to orthopnea. The patient has a blood pressure cuff at home and has noted elevated blood pressures. He presented to [facility] where admission labs were notable for a BUN and creatinine of 25 and 1.94, respectively. B-type natriuretic peptide was 2366 and a borderline elevated troponin of 0.09. EKG reveals left ventricular hypertrophy with associated ST-T wave abnormalities. The patient denies chest pain.   PAST MEDICAL HISTORY: 1.  Hypertension.  2.  Possible sleep apnea.   MEDICATIONS ON ADMISSION: None.   SOCIAL HISTORY: The patient is divorced. He has 4 children. He works 2 jobs. He drinks occasionally, at which time he smokes.   FAMILY HISTORY: Positive for coronary artery disease.    REVIEW OF SYSTEMS:  CONSTITUTIONAL: No fever or chills.  EYES: No blurry vision.  EARS: No hearing loss.  RESPIRATORY: The patient does have shortness of breath.  CARDIOVASCULAR: The patient denies chest pain. He does have pedal edema and orthopnea.  GASTROINTESTINAL: The patient does have nausea and vomiting.  GENITOURINARY: No dysuria or hematuria.  ENDOCRINE: No polyuria or polydipsia.  MUSCULOSKELETAL: No arthralgias or myalgias.  NEUROLOGICAL: No  focal muscle weakness or numbness.  PSYCHOLOGICAL: No depression or anxiety.   PHYSICAL EXAMINATION: VITAL SIGNS: Blood pressure is 138/101, pulse 101, respirations 18, temperature 98.2, pulse oximetry 95%.  HEENT: Pupils are equal, reactive to light and accommodation.  NECK: Supple without thyromegaly.  LUNGS: Clear.  CARDIOVASCULAR: Normal JVP. Normal PMI.  Regular rate and rhythm. Normal S1, S2. No appreciable gallop, murmur or rub.  ABDOMEN: Soft and nontender. Pulses were intact bilaterally.  MUSCULOSKELETAL: Normal muscle tone.  NEUROLOGICAL: The patient is alert and oriented x3. Motor and sensory are both grossly intact.   IMPRESSION: The patient is a 44 year old gentleman who presents with congestive heart failure of unknown etiology. The patient has a several-month history of progressive pedal edema, exertional dyspnea and orthopnea in the absence of chest pain. Troponin is borderline elevated, which is likely due to demand/supply ischemia in light of hypertension and a new diagnosis of renal failure which is likely secondary to hypertension.   RECOMMENDATIONS: 1. I agree with overall current therapy.  2. Would defer full-dose anticoagulation.  3. Continue diuresis.  4. Continue to cycle cardiac enzymes.  5. Review 2-D echocardiogram.       6. Further recommendations pending echocardiogram results. The patient may benefit from functional study such as Lexiscan sestamibi study during this hospitalization to rule out underlying myocardial ischemia.  ____________________________ Marcina Millard, MD ap:cb D: 10/13/2012 16:45:13 ET T: 10/13/2012 20:07:53 ET JOB#: [id number]  cc: Marcina Millard, MD, <Dictator> Marcina Millard MD ELECTRONICALLY SIGNED 11/15/2012 13:42

## 2014-10-27 NOTE — Consult Note (Signed)
Brief Consult Note: Diagnosis: CHF, ? etiology, borderline elevated troponin, probable demand supply ischemia, no CP.   Patient was seen by consultant.   Consult note dictated.   Comments: REC  Agree with current therapy, defer full dose anticoagulation, review echo, cont diuresis, further rec pending echo results.  Electronic Signatures: Marcina Millard (MD)  (Signed 09-Apr-14 16:48)  Authored: Brief Consult Note   Last Updated: 09-Apr-14 16:48 by Marcina Millard (MD)

## 2014-10-27 NOTE — Telephone Encounter (Signed)
°  1. Which medications need to be refilled? Enalapril  2. Which pharmacy is medication to be sent to? [facility] on [address] [place]  3. Do they need a 30 day or 90 day supply? 30 day   4. Would they like a call back once the medication has been sent to the pharmacy? no
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
## 2014-10-27 NOTE — Consult Note (Signed)
Brief Consult Note: Diagnosis: epilepsy, CHF, ARF, DM, ? OSA, diabetic peripheral neuropathy?.   Patient was seen by consultant.   Consult note dictated.   Comments: - recurrent seizures since age of 30 no work up except in 1980s,agree with MRI and EEG. - pt has no insurance so carbamazepine 200 mg po bid might be better option for him ($ 4 at [facility]) - he was not on any Anti Epileptic Drug before.  - Seizure Advice -Keep a seizure diary/log,  -Take your medications regularly,  -Don't not stop seizure medications abruptly,  -Avoid alcohol,  -Avoid sleep deprivation,  -Try to find out triggers for your seizures (e.g. flashing lights, stress) and try to avoid them,  -Avoid unsafe areas, such as swimming by yourself, going on roof etc. - so if you have seizure at that place, you might injure yourself.  -Legally driving is not permitted in state of [place] for 6-12 months after last seizure,   During a seizure -Do not force anything into their mouth -Do not give them water or medicine until the seizure is over -Do not try to stop jerking movement -Call 911 if the patient has prolonged seizure (more than 3-4 min) or patient does not regain consciousness between seizures.  2) Diabetic peripheral neuropathy  3) Likely has OSA - should get out pt sleep study and CPAP - cost might be an issue.  Electronic Signatures: Jolene Provost (MD)  (Signed 09-Apr-14 19:13)  Authored: Brief Consult Note   Last Updated: 09-Apr-14 19:13 by Jolene Provost (MD)

## 2014-10-27 NOTE — H&P (Signed)
PATIENT NAME:  Kent Stanley, KROENING MR#:  [id number] DATE OF BIRTH:  [birth year]  DATE OF ADMISSION:  10/13/2012  PRIMARY CARE PHYSICIAN: None.   HISTORY OF PRESENT ILLNESS: The patient is a 44 year old African American male with past medical history significant for history of seizures since the age of 50, who is not on any medications, presents to the hospital with complaints of 53-month history of lower extremity swelling as well as shortness of breath, inability to sleep lying flat, as well as coughing and having intermittent nausea and vomiting as well as headaches. He also admits of having some sleepiness in daytime as well as snoring. Admits of having significant fatigue and weakness. Admits to having some pain, mostly in his feet, however, his right arm over the past few days. Denies any significant pains in his chest. Admits of having some snoring as well as stopping breathing in the middle of the night, as well as sleepiness in daytime. History of cough, wheezing in his lungs, shortness of breath. Admits of having some clear sputum production recently; however, last week he had some yellow phlegm. Admits of orthopnea as mentioned above, left lower extremity swelling, also tachycardia. No significant arrhythmias were noted. Admits of syncopal or presyncopal episodes intermittently, as well as dyspnea on exertion. He has intermittent dizziness. He has to sit down and because of significant dizziness, he goes into seizures if he does not sit down. Intermittent nausea and vomiting, increased frequency of urination. Admits of having lower extremity cramps in this thighs as well as his hands, as well as numbness in his hands as well as his feet, and cramping mostly at nighttime. Because of this discomfort, he decided to come to the Emergency Room for further evaluation. In the Emergency Room, he was noted to have EKG which showed some T depressions in anterolateral leads and LVH pattern, and chest x-ray was  consistent with congestive heart failure. Hospitalist services were contacted for admission.   PAST MEDICAL HISTORY: Significant for history of seizure disorder since age of 48; he had injury/trauma to his head; however, he never took any medications. In the past these seizures would come up only whenever he would smoke marijuana or drink alcohol; however, now it is not associated with any of those, as he is not drinking any more alcohol or smoking marijuana.   MEDICATIONS: None.   PAST SURGICAL HISTORY: None.  ALLERGIES: None.   FAMILY HISTORY: The patient's mother had angina as well as diabetes mellitus, also died of brain cancer. Dad had hypertension, diabetes, coronary artery disease, hyperlipidemia; he is deceased.   SOCIAL HISTORY: The patient is divorced. He has 4 children. He used to smoke since the age of 71, quit 6 weeks ago. He works as a Financial risk analyst.   REVIEW OF SYSTEMS:  As above. CONSTITUTIONAL: Denies any fevers or chills, weight loss or gain.  EYES: Denies any blurry vision, double vision, glaucoma or cataracts.  EARS, NOSE, THROAT: Denies any tinnitus. Admits of having some year-round allergies. Denies epistaxis, discharge postnasal drip, dentures, difficulty swallowing.  RESPIRATORY: Denies any hemoptysis, asthma, COPD.  CARDIOVASCULAR: Denies any chest pains, palpitations. GASTROINTESTINAL: Denies any diarrhea, hematemesis. Admits of mild rectal bleeding intermittently due to constipation. Denies any change in bowel habits.  GENITOURINARY: Denies dysuria, hematuria or incontinence.  ENDOCRINE: Denies any polydipsia, nocturia, thyroid problems, heat or cold intolerance or thirst.  HEMATOLOGIC: Denies any anemia, easy bruising, bleeding, swollen glands. SKIN: Denies any acne, rash, lesion, change in moles.  MUSCULOSKELETAL: Denies  arthritis, gout, swelling, redness. Admits of having some injury/burn to his left lower extremity in his anterolateral tibia area, and he has wound which  looks like sloughed bulla.  NEUROLOGIC: Denies any weakness. Admits of epilepsy. Denies any tremors. PSYCHIATRIC: Denies anxiety, insomnia or depression.   PHYSICAL EXAMINATION:  VITAL SIGNS: On arrival to the hospital, temperature was 97.6, pulse 101, respiration rate 20, blood pressure 149/94, saturation 96% to 97% on room air.  GENERAL: This is a well-developed, well-nourished, obese African American male in no significant distress, mildly uncomfortable whenever he talks for a little longer period of time. He is short of breath whenever he tries to walk around.  HEENT: His pupils are equal and reactive to light. Extraocular movements intact. No icterus or conjunctivitis. Has normal hearing. No pharyngeal erythema. Mucosa is moist.  NECK: No masses. Supple, nontender. Thyroid is not enlarged. Thick fat neck. No adenopathy. No JVD or carotid bruits bilaterally. Full range of motion.  LUNGS: Clear to auscultation, markedly diminished breath sounds. A few rhonchi were heard, as well as crackles were heard at bases. However, no significant wheezing or labored respirations were noted whenever he is at rest; however, he does have some shortness of breath whenever he moves around as he has increased effort to breathe. No dullness to percussion. Not in overt respiratory distress.  CARDIOVASCULAR: S1, S2 appreciated. A III/VI systolic murmur was heard in mitral auscultation site radiating to left axilla. PMI not lateralized. Chest is nontender to palpation.  EXTREMITIES: Pedal pulses 1+, 2 to 3+ lower extremity edema. No calf tenderness or cyanosis. The patient does have some discomfort on palpation of his left thigh. The patient does have a wound of approximately 1 inch in diameter, seems to be bursted bulla in left lateral pretibial area.  ABDOMEN: Protuberant, soft, nontender. Bowel sounds are present. No hepatosplenomegaly or masses were noted.  RECTAL: Deferred.  MUSCLE STRENGTH: Able to move all  extremities. No cyanosis, degenerative joint disease or kyphosis. Gait is steady.  SKIN: Did not reveal any rashes apart from what is mentioned above. Erythema was noted around the bulla. No nodularity or induration. Skin was warm and dry to palpation.  LYMPHATIC: No adenopathy in the cervical region.  NEUROLOGICAL: Cranial nerves grossly intact. Sensory is intact, except mildly diminished to light touch in lower extremities. No dysarthria or aphasia. The patient is alert, oriented to time, person, place, cooperative. Memory is good.  PSYCHIATRIC: No significant confusion, agitation or depression noted.   LABORATORY DATA: BMP showed glucose of 185. BUN and creatinine were 25 and 1.94. Estimated GFR for African American would be 48. Calcium level was 7.9. Beta-type natriuretic peptide was 2366. Liver enzymes: Albumin level was 2.7, total protein 6.0; otherwise, liver enzymes were unremarkable. Troponin is elevated at 0.09. Urine drug screen is pending. The patient's white blood cell count is normal at 8.1, hemoglobin 13.4, platelet count 226. EKG revealed normal sinus rhythm at 99 beats per minute, normal axis, possible left atrial enlargement, left ventricular hypertrophy with repolarization abnormalities, T depressions in anterolateral leads, no EKG to compare with, prolonged QTc to 479 ms. Chest x-ray read by radiologist, PA and lateral on 10/13/2012, showed findings consistent with CHF and mild interstitial edema.   ASSESSMENT AND PLAN:  1.  Congestive heart failure, acute diastolic likely, right as well as left heart, due to obstructive sleep apnea as well as hypertension: Admit patient to medical floor. Start him on Lasix intravenously. Initiate patient on beta blockers, low dose of  hydralazine as well as Imdur. Unable to give angiotensin-converting enzyme inhibitor due to acute renal failure. Will get echocardiogram as well as cardiology consultation.  2.  Hypertension: Continue beta blockers, as  well as low dose of hydralazine and Imdur. Follow for tachycardia.  3.  Obstructive sleep apnea: Get nocturnal oximetry study done on room air.  4.  Obesity: Check TSH, hemoglobin A1c. I am suspecting that the patient may have underlying diabetes mellitus. Get lipid panel as well as dietary consultation.  5.  Elevated troponin: Start patient on aspirin, beta blockers, nitroglycerin topically, as well as checking cardiac enzymes x 3. Get cardiologist involved and get echocardiogram done. 6.  Acute renal failure: Get kidney ultrasound. Get nephrology involved. Urinalysis, cultures if needed. Will have bladder scan done and CK levels checked, as I am worried that may have rhabdomyolysis.  7.  Questionable restless leg syndrome: Start patient on Mirapex.  8.  Left lower extremity wound: Will initiate dressing with triple antibiotic ointment topically.  9.  Tobacco abuse: Discussed cessation for 4 minutes. Nicotine replacement therapy will be initiated; however, the patient claims to be quitting smoking. 10.  Seizure disorder: Get MRI of brain and get electroencephalogram. Get neurology consultation. Load patient with Keppra.   TIME SPENT: One hour.   ____________________________ Katharina Caper, MD rv:jm D: 10/13/2012 14:43:35 ET T: 10/13/2012 16:07:31 ET JOB#: [id number]  cc: Katharina Caper, MD, <Dictator> Donzel Romack MD ELECTRONICALLY SIGNED 11/07/2012 17:58

## 2014-10-31 ENCOUNTER — Encounter: Payer: Self-pay | Admitting: Cardiovascular Disease

## 2014-10-31 ENCOUNTER — Ambulatory Visit (INDEPENDENT_AMBULATORY_CARE_PROVIDER_SITE_OTHER): Payer: BLUE CROSS/BLUE SHIELD | Admitting: Cardiovascular Disease

## 2014-10-31 VITALS — BP 122/82 | HR 92 | Ht 72.0 in | Wt 325.8 lb

## 2014-10-31 DIAGNOSIS — N5201 Erectile dysfunction due to arterial insufficiency: Secondary | ICD-10-CM | POA: Diagnosis not present

## 2014-10-31 DIAGNOSIS — N529 Male erectile dysfunction, unspecified: Secondary | ICD-10-CM | POA: Insufficient documentation

## 2014-10-31 DIAGNOSIS — M7918 Myalgia, other site: Secondary | ICD-10-CM

## 2014-10-31 DIAGNOSIS — I10 Essential (primary) hypertension: Secondary | ICD-10-CM | POA: Diagnosis not present

## 2014-10-31 DIAGNOSIS — M791 Myalgia: Secondary | ICD-10-CM

## 2014-10-31 DIAGNOSIS — I5022 Chronic systolic (congestive) heart failure: Secondary | ICD-10-CM | POA: Diagnosis not present

## 2014-10-31 MED ORDER — SILDENAFIL CITRATE 25 MG PO TABS: 25.0000 mg | ORAL_TABLET | Freq: Every day | ORAL | Status: DC | PRN

## 2014-10-31 MED ORDER — TRAMADOL HCL 50 MG PO TABS: 50.0000 mg | ORAL_TABLET | Freq: Four times a day (QID) | ORAL | Status: DC | PRN

## 2014-10-31 NOTE — Patient Instructions (Signed)
Medication Instructions: 1) Start tramadol 50 mg one tablet by mouth every 6 hours as needed for pain 2) Start Viagra 25 mg one tablet by mouth 30 minutes prior to activity as needed  Labwork: - none  Procedures/Testing: - none  Follow-Up: - Your physician recommends that you schedule a follow-up appointment in: 3 months with Dr. Kirke Corin  Any Additional Special Instructions Will Be Listed Below (If Applicable).

## 2014-10-31 NOTE — Assessment & Plan Note (Signed)
The patient has symptoms of erectile dysfunction. Some of this could be related to low cardiac output and treatment with a beta blocker. I prescribed Viagra 25 mg as needed.

## 2014-10-31 NOTE — Progress Notes (Signed)
HPI  This is a 44 year old African American male who is here today for a follow-up visit regarding chronic systolic heart failure due to nonischemic cardiomyopathy. He was hospitalized at [facility] in April of 2014 with congestive heart failure. He was found to have an ejection fraction of less than 20%. He was treated medically with improvement in symptoms. He reports history of a seizure disorder since he was 14, hypertension and likely sleep apnea. He is obese and has chronic kidney disease. He quit smoking in 2014. He drinks red wine very rarely. He has used marijuana and cocaine in the past but none recently Cardiac catheterization in March showed minor luminal irregularities without evidence of obstructive coronary artery disease. Left ventricular angiography was not performed due to chronic kidney disease. Left ventricular end-diastolic pressure was 30 mmHg. He was noted to have significant apnea episodes during cardiac cath with bradycardia. I adjusted his cardiac medications and increased the dose of Lasix with subsequent improvement in symptoms. He was seen recently by [facility] for right groin pain. The cath site was intact. An appointment was scheduled with Dr. Juanetta Gosling to follow-up on his chronic medical conditions. The patient currently has no primary care physician. Dyspnea and orthopnea have improved. He continues to have pain in the right lower quadrant of unclear etiology.    No Known Allergies   Current Outpatient Prescriptions on File Prior to Visit  Medication Sig Dispense Refill  . aspirin EC 81 MG tablet Take 1 tablet (81 mg total) by mouth daily. 90 tablet 3  . carvedilol (COREG) 6.25 MG tablet Take 1 tablet (6.25 mg total) by mouth 2 (two) times daily with a meal. 60 tablet 6  . enalapril (VASOTEC) 5 MG tablet Take 1 tablet (5 mg total) by mouth daily. 30 tablet 6  . furosemide (LASIX) 40 MG tablet Take 1 tablet (40 mg total) by mouth 2 (two) times daily. 60 tablet 6  . lovastatin  (MEVACOR) 20 MG tablet Take 1 tablet (20 mg total) by mouth at bedtime. 30 tablet 3   No current facility-administered medications on file prior to visit.     Past Medical History  Diagnosis Date  . Seizure disorder   . History of traumatic injury of head   . Stroke   . Chronic kidney disease   . Irregular heart beat   . CHF (congestive heart failure)   . Chronic systolic heart failure April of 2014    Ejection fraction less than 20%  . Hyperlipidemia   . Hypertension   . Obesity      Past Surgical History  Procedure Laterality Date  . Cardiac catheterization      [facility]     Family History  Problem Relation Age of Onset  . Diabetes Mother   . Brain cancer Mother   . Heart attack Mother   . Hypertension Father   . Diabetes Father   . Heart disease Father   . Hyperlipidemia Father   . Heart attack Father   . Heart failure Father      History   Social History  . Marital Status: Single    Spouse Name: N/A  . Number of Children: N/A  . Years of Education: N/A   Occupational History  . Not on file.   Social History Main Topics  . Smoking status: Former Smoker -- 10 years    Types: Cigarettes  . Smokeless tobacco: Not on file  . Alcohol Use: Yes     Comment: socially  .  Drug Use: No  . Sexual Activity: Not on file   Other Topics Concern  . Not on file   Social History Narrative     ROS A 10 point review of system was performed. It is negative other than that mentioned in the history of present illness.   PHYSICAL EXAM   BP 122/82 mmHg  Pulse 92  Ht 6' (1.829 m)  Wt 325 lb 12 oz (147.759 kg)  BMI 44.17 kg/m2 Constitutional: He is oriented to person, place, and time. He appears well-developed and well-nourished. No distress.  HENT: No nasal discharge.  Head: Normocephalic and atraumatic.  Eyes: Pupils are equal and round.  No discharge. Neck: Normal range of motion. Neck supple. No JVD present. No thyromegaly present.  Cardiovascular: Normal  rate, regular rhythm, normal heart sounds. Exam reveals no gallop and no friction rub. No murmur heard.  Pulmonary/Chest: Effort normal and breath sounds normal. No stridor. No respiratory distress. He has no wheezes. He has no rales. He exhibits no tenderness.  Abdominal: Soft. Bowel sounds are normal. He exhibits no distension. There is no tenderness. There is no rebound and no guarding.  Musculoskeletal: Normal range of motion. He exhibits no edema and no tenderness.  Neurological: He is alert and oriented to person, place, and time. Coordination normal.  Skin: Skin is warm and dry. No rash noted. He is not diaphoretic. No erythema. No pallor.  Psychiatric: He has a normal mood and affect. His behavior is normal. Judgment and thought content normal.  No groin hematoma. Radial pulses are normal bilaterally.        ASSESSMENT AND PLAN

## 2014-10-31 NOTE — Assessment & Plan Note (Signed)
He appears to be euvolemic. Symptoms improved after increasing the dose of Lasix and adjusting his heart failure medications. He does have chronic kidney disease. Thus, I do not recommend spironolactone.

## 2014-10-31 NOTE — Assessment & Plan Note (Signed)
Blood pressure is now well controlled on current medications. 

## 2014-10-31 NOTE — Assessment & Plan Note (Signed)
He is having pain at the cath site. However, the site appears to be intact with no evidence of hematoma or swelling. Also the catheterization was almost 2 months ago and he had no issues early on. Thus, it does not seem to be related to the procedure. He is going to establish with Dr. Juanetta Gosling as a new patient. In the meanwhile, I gave him a prescription for tramadol to be used as needed with no refills.

## 2014-11-05 NOTE — Discharge Summary (Signed)
PATIENT NAME:  Kent Stanley, Kent Stanley MR#:  [id number] DATE OF BIRTH:  03-12-1971  DATE OF ADMISSION:  08/30/2014 DATE OF DISCHARGE:  09/02/2014  CHIEF COMPLAINT ON ADMISSION: Chest pain.   DISCHARGE DIAGNOSES:  1. Non-ST elevation myocardial infarction, patient declined cardiac catheterization during this admission and has scheduled for next week.  2. Chronic systolic congestive heart failure with ejection fraction less than 20%.  3. Hypertension.  4. Seizure disorder.  5. Polysubstance abuse.  6. Chronic kidney disease stage III.  7. Obstructive sleep apnea.   CONSULTATIONS: Julien Nordmann, MD, cardiology.   PROCEDURES:  1. Myocardial gated stress test 2-day study interpreted by Dr. Mariah Milling is positive for signs of ischemia in the anterior and anterior septal walls. Significant attenuation artifact and gastrointestinal uptake artifact. Ejection fraction estimated to be 17%.  2. Chest x-ray 08/30/2014 showing no active cardiopulmonary disease.  3. A 2D echocardiogram 08/31/2014 showing ejection fraction less than 20%, severely decreased global left ventricular systolic function, severely increased left ventricular internal cavity size, mild concentric left ventricular hypertrophy, pseudo normal pattern of left ventricular diastolic filling, severely dilated left atrium, moderately dilated right atrium, moderate mitral valve regurgitation, mild tricuspid valve regurgitation.   HISTORY OF PRESENT ILLNESS: This 44 year old African American man with history of coronary artery disease, ischemic cardiomyopathy with ejection fraction 20% presents with chest pain. He describes 1 day duration of intermittent pain, central in location, radiating to the left arm. His troponins were elevated. He had an initial Emergency Room evaluation and then left AGAINST MEDICAL ADVICE. He then returned with repeat episode of chest pain and troponins were trending upwards. Chest pain is improved considerably with the  addition of isosorbide mononitrate 30 mg daily. He is also being discharged with a prescription for nitroglycerin to be used as needed.   HOSPITAL COURSE BY PROBLEM:  1. Non-ST elevation myocardial infarction. The patient had elevated troponins on presentation and was followed by cardiology throughout the hospitalization. He had an inpatient stress test done, which required 2 days due to his body habitus. Stress test was positive and cardiac catheterization was offered. The patient declined catheterization during hospitalization and prefers to have this scheduled for next week.  2. Chronic combined systolic and diastolic congestive heart failure with ejection fraction less than 20%. He has been compliant with medical therapy over time. He does report ongoing alcohol use and marijuana use. He has also used cocaine as recently as December. During hospitalization, Coreg was increased. Lasix and enalapril continued. He will need close follow up with cardiology in the outpatient setting.  3. Hypertension: Blood pressure was fairly well controlled throughout hospitalization on hydralazine, enalapril, Lasix and isosorbide.  4. Obstructive sleep apnea: This patient needs an outpatient sleep study and this needs to be scheduled in the outpatient setting.  5. Obesity: Counseling provided. The patient needs increase in exercise and decrease in calories as much as possible.  6. Seizure disorder: Stable on carbamazepine.  7. Chronic kidney disease stage III: This was stable from baseline creatinine of about 1.9. Certainly poses a challenge for cardiac catheterization. He will be presenting 2 hours early to the catheterization for pre-hydration.   DISCHARGE PHYSICAL EXAMINATION:  VITAL SIGNS: Temperature then it 98.6, pulse 91, respirations 19, blood pressure 137/87, oxygenation 93% on room air.  GENERAL: No acute distress.  CARDIOVASCULAR: Regular rate and rhythm. No murmurs, rubs, or gallops. No peripheral edema.  Peripheral pulses 2+.  RESPIRATORY: Lungs clear to auscultation bilaterally with good air movement.  ABDOMEN: Soft,  nontender, obese. Bowel sounds normal. No guarding, no rebound, no hepatosplenomegaly. No mass.  PSYCHIATRIC: The patient is alert and oriented with good insight into his clinical condition.   LABORATORY DATA: Sodium 142, potassium 4.3, chloride 104, bicarbonate 32, BUN 20, creatinine 1.81 glucose 127. Troponins 0.13. UDS is negative. White blood cells 8.5, hemoglobin 14.1, platelets 198,000. MCV is 99.  DISCHARGE MEDICATIONS:  1. Carbamazepine 200 mg 1 tablet twice a day.  2. Hydralazine 10 mg 1 tablet 3 times a day.  3. Enalapril 2.5 mg 1 tablet once a day.  4. Furosemide 40 mg 1 tablet once a day.  5. Lovastatin 20 mg 1 tablet once a day.  6. Carvedilol 12.5 mg 1 tablet 2 times a day.  7. Aspirin 325 mg 1 tablet once a day.  8. Isosorbide mononitrate 30 mg 1 tablet once a day.  9. Nitroglycerin 0.4 mg 1 tablet every 5 minutes as needed for chest pain.   DISCHARGE INSTRUCTIONS:  DIET: Heart healthy, low sodium diet.  ACTIVITY: As tolerated.  TIMEFRAME FOR FOLLOWUP: Follow up on Friday 09/08/2014 at for cardiac catheterization.   TIME SPENT ON DISCHARGE: 45 minutes.   ____________________________ Ena Dawley. Clent Ridges, MD cpw:ap D: 09/02/2014 15:45:17 ET T: 09/02/2014 19:45:59 ET JOB#: [id number]  cc: Santina Evans P. Clent Ridges, MD, <Dictator> Muhammad A. Kirke Corin, MD unknown cc  Gale Journey MD ELECTRONICALLY SIGNED 09/11/2014 10:43

## 2014-11-05 NOTE — Consult Note (Signed)
General Aspect Primary Cardiologist: Dr. April Holding, MD _________________  44 year old male with history of chronic systolic CHF with EF less than 20%, stroke, CKD stage III, seizure disorder, HTN, HLD, obesity, OSA, and polysubstance abuse who presented to [facility] today with increased SOB, chest pain, and left posterior arm pain.  ________________  PMH: 1. Chronic systolic CHF with EF less than 20% 2. Stroke 3. CKD stage III 4. Seizure disorder 5. HTN 6. HLD 7. Obesity 8. OSA 9. Polysubstance abuse (ETOH, THC, cocaine, and tobacco) _________________   Present Illness 44 year old male with the above problem list who presented to [facility] with increased SOB, chest pain, and left posterior arm pain.  He was hospitalized at [facility] in April of 2014 with congestive heart failure. He was found to have an ejection fraction of less than 20%. He was treated medically with improvement in symptoms. He was planned to have a nuclear stress test at that time, unfortunately that was canceled. No prior cardiac caths. He reports history of a seizure disorder since he was 74. He quit smoking in 2014. He drinks red wine rarely. He cotinues to use marijuana and last used cocaine 1 month ago.   He establised with [facility] back in May 2015 after obtaining insurance. At that time he noted intermittent chronic exertional dyspnea and orthopnea. He also noted having shoulder discomfort and he has been using ibuprofen frequently. He was also occasionally having substernal chest tightness at that time. His girlfriend reported frequent apnea episodes at night. He was scheduled for an echo, which was canceled and never rescheduled. He was also advised that he would need an ischemic evaluation for possible ICD.   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 He continued to have this arm pain throughout the night. Arm pain was so severe it  limited his ROM. He took two 81 mg aspirin upon his arrival home after work without change in symptoms. He also noted some left sided chest pain. No associated increased SOB, diaphoresis, nausea, vomiting, presyncope, or syncope. He called our office with the above and was advised to come to the ED. He decided to go to work instead the following day. He eventually could not take the chest and arm pain any longer, thus presenting to [facility] for further evaluation.  He also notes over the past year he has noted some slowly increasing SOB and PND. He has at times been required to sleep in a recliner. Intermittent LEE. No early satiety. He does not watch his fluid or salt intake.   Upon his evaluation to [facility] he was found to have a troponin level of 0.13-->0.16-->0.13 (he has known chronically eleavted TnI in the setting of his CKD around 0.09 to 0.10). SCr found to be 2.00-->1.86. Echo showed EF <84%, diastolic dysfunction, dilated CM, mod MR, mild TR. He was started on a heparin gtt. He has a cold.   He is currently without pain.   Physical Exam:  GEN no acute distress, obese   HEENT hearing intact to voice   NECK difficult to examine 2/2 habitus   RESP normal resp effort  clear BS   CARD Regular rate and rhythm  No murmur   ABD denies tenderness  soft   EXTR trace non pitting edema to the mid shin   SKIN normal to palpation   NEURO cranial nerves intact  PSYCH alert   Review of Systems:  General: Fatigue   Skin: No Complaints   ENT: Sore Throat  congestion   Eyes: No Complaints   Neck: No Complaints   Respiratory: Frequent cough  Short of breath  Wheezing  Sputum   Cardiovascular: Chest pain or discomfort  Dyspnea  Orthopnea  Edema   Gastrointestinal: No Complaints   Genitourinary: No Complaints   Vascular: No Complaints   Musculoskeletal: left arm pain   Neurologic: No Complaints   Hematologic: No Complaints   Endocrine: No Complaints   Psychiatric: No  Complaints   Review of Systems: All other systems were reviewed and found to be negative   Family & Social History:  Family and Social History:  Family History Hypertension   Social History positive ETOH, positive Illicit drugs, THC and cocaine   + Tobacco Prior (greater than 1 year)   Place of Living Home     Kidney Issues:    Stroke: Apr 2014   CHF:    Seizures:    Negative, patient denies medical history.:   Home Medications: Medication Instructions Status  carBAMazepine 200 mg oral tablet 1 tab(s) orally 2 times a day Active  carvedilol 6.25 mg oral tablet 1 tab(s) orally 2 times a day Active  hydrALAZINE 10 mg oral tablet 1 tab(s) orally 3 times a day Active  aspirin 81 mg oral tablet, chewable 1 tab(s) orally once a day (in the morning) Active  enalapril 2.5 mg oral tablet 1 tab(s) orally once a day (in the morning) Active  furosemide 40 mg oral tablet 1 tab(s) orally once a day (in the morning) Active  lovastatin 20 mg oral tablet 1 tab(s) orally once a day (at bedtime) Active   Lab Results:  Routine Chem:  25-Feb-16 06:33   Glucose, Serum  119  BUN  22  Creatinine (comp)  1.86  Sodium, Serum 144  Potassium, Serum 4.1  Chloride, Serum 105  CO2, Serum 31  Calcium (Total), Serum  8.4  Anion Gap 8  Osmolality (calc) 291  eGFR (African American)  51  eGFR (Non-African American)  42 (eGFR values <65m/min/1.73 m2 may be an indication of chronic kidney disease (CKD). Calculated eGFR, using the MRDR Study equation, is useful in  patients with stable renal function. The eGFR calculation will not be reliable in acutely ill patients when serum creatinine is changing rapidly. It is not useful in patients on dialysis. The eGFR calculation may not be applicable to patients at the low and high extremes of body sizes, pregnant women, and vegetarians.)  Cholesterol, Serum 173  Triglycerides, Serum 139  HDL (INHOUSE)  34  VLDL Cholesterol Calculated 28  LDL  Cholesterol Calculated  111 (Result(s) reported on 31 Aug 2014 at [facility])  Cardiac:  25-Feb-16 01:06   CPK-MB, Serum  5.8 (Result(s) reported on 31 Aug 2014 at 02:10AM.)  Troponin I  0.13 (0.00-0.05 0.05 ng/mL or less: NEGATIVE  Repeat testing in 3-6 hrs  if clinically indicated. >0.05 ng/mL: POTENTIAL  MYOCARDIAL INJURY. Repeat  testing in 3-6 hrs if  clinically indicated. NOTE: An increase or decrease  of 30% or more on serial  testing suggests a  clinically important change)  Routine Hem:  25-Feb-16 06:33   WBC (CBC) 7.7  RBC (CBC)  4.26  Hemoglobin (CBC) 13.8  Hematocrit (CBC) 42.0  Platelet Count (CBC) 180  MCV 99  MCH 32.5  MCHC 33.0  RDW  16.0  Neutrophil % 59.5  Lymphocyte % 20.0  Monocyte % 14.8  Eosinophil % 5.0  Basophil % 0.7  Neutrophil # 4.6  Lymphocyte # 1.5  Monocyte #  1.1  Eosinophil # 0.4  Basophil # 0.1 (Result(s) reported on 31 Aug 2014 at 06:58AM.)   EKG:  EKG Interp. by me   Interpretation NSR, 85 bpm, lateral TWI   Radiology Results: Cardiology:    25-Feb-16 12:43, Echo Doppler  Echo Doppler   REASON FOR EXAM:      COMMENTS:       PROCEDURE: [facility] - ECHO DOPPLER COMPLETE(TRANSTHOR)  - Aug 31 2014 12:43PM     RESULT: Echocardiogram Report    Patient Name:   Kent Stanley Date of Exam: 08/31/2014  Medical Rec #:  [id number]               Custom1:  Date of Birth:  Jun 18, 1971             Height:       72.0 in  Patient Age:    59 years             Weight:       317.5 lb  Patient Gender: M                    BSA:          2.59 m??    Indications: MI  Sonographer:    Sherrie Sport RDCS  Referring Phys: Valentino Nose, K    Sonographer Comments: Suboptimal apical window.    Summary:   1. Left ventricular ejection fraction, by visual estimation, is <20%.   2. Severely decreased global left ventricular systolic function.   3. Severely increased leftventricular internal cavity size.   4. Mild concentric left ventricular hypertrophy.   5.  Pseudonormal pattern of LV diastolic filling.   6. Severely dilated left atrium.   7. Moderately dilated right atrium.   8. Moderate mitral valve regurgitation.   9. Mild tricuspid regurgitation.  2D AND M-MODE MEASUREMENTS (normal ranges within parentheses):  Left Ventricle:          Normal  IVSd (2D):      1.14 cm (0.7-1.1)  LVPWd (2D):     1.63 cm (0.7-1.1) Aorta/LA:                  Normal  LVIDd (2D):  7.20 cm (3.4-5.7) Aortic Root (2D): 3.80 cm (2.4-3.7)  LVIDs (2D):     6.91 cm           Left Atrium (2D): 5.00 cm (1.9-4.0)  LV FS (2D):      4.0 %   (>25%)  LV EF (2D):      8.8 %   (>50%)                                    Right Ventricle:                               RVd (2D):        7.56 cm  LV DIASTOLIC FUNCTION:  MV Peak E: 0.80 m/s E/e' Ratio: 29.30  MV Peak A: 0.69 m/s Decel Time: 227 msec  E/A Ratio: 1.16  SPECTRAL DOPPLER ANALYSIS (where applicable):  Mitral Valve:  MV P1/2 Time: 65.83 msec  MV Area, PHT: 3.34 cm??  Aortic Valve: AoV Max  Vel: 1.04 m/s AoV Peak PG: 4.3 mmHg AoV Mean PG:  LVOT Vmax: 0.56 m/s LVOT VTI:  LVOT Diameter: 2.30 cm  AoV Area, Vmax: 2.25 cm?? AoV Area, VTI:  AoV Area, Vmn:  Tricuspid Valve and PA/RV Systolic Pressure: TR Max Velocity: 1.68 m/s RA   Pressure: 5 mmHg RVSP/PASP: 16.3 mmHg  Pulmonic Valve:  PV Max Velocity: 1.06 m/s PV Max PG: 4.5 mmHg PV Mean PG:    PHYSICIAN INTERPRETATION:  Left Ventricle: The left ventricular internal cavity size was severely   increased. Mild concentric left ventricular hypertrophy. Global LV   systolic function was severely decreased. Left ventricular ejection   fraction, by visual estimation, is <20%. Spectral Doppler shows     pseudonormal pattern of LV diastolic filling.  Right Ventricle: Normal right ventricular size, wall thickness, and   systolic function.  Left Atrium: The left atrium is severely dilated.  Right Atrium: The right atrium is moderately dilated.  Pericardium: There is no evidence  of pericardial effusion.  Mitral Valve: The mitral valve is normal in structure. No evidence of   mitral valve stenosis. Moderate mitral valve regurgitation is seen.  Tricuspid Valve: The tricuspid valve is normal. Mild tricuspid   regurgitation is visualized. The tricuspid regurgitant velocity is 1.68   m/s, and with an assumed right atrial pressure of 5 mmHg, the estimated   right ventricular systolic pressure is normal at 16.3 mmHg.  Aortic Valve: The aortic valve is trileaflet and structurally normal,   with normal leaflet excursion; without any evidence of aortic stenosis or   insufficiency.  Pulmonic Valve: The pulmonic valve is normal. No indication of pulmonic   valve regurgitation.  Aorta: The aortic root is normal in size and structure.  Venous: The inferior vena cava was not well visualized.    [id number] Kathlyn Sacramento MD  Electronically signed by [id number] Kathlyn Sacramento MD  Signature Date/Time: 08/31/2014/1:21:34 PM    *** Final ***    IMPRESSION: .        Verified By: Mertie Clause. Fletcher [name], M.D., MD    No Known Allergies:   Vital Signs/Nurse's Notes: **Vital Signs.:   25-Feb-16 11:49  Vital Signs Type Routine  Temperature Temperature (F) 98  Celsius 36.6  Temperature Source oral  Pulse Pulse 87  Respirations Respirations 18  Systolic BP Systolic BP 300  Diastolic BP (mmHg) Diastolic BP (mmHg) 97  Mean BP 112  Pulse Ox % Pulse Ox % 92  Pulse Ox Activity Level  At rest  Oxygen Delivery Room Air/ 21 %    Impression 44 year old male with history of chronic systolic CHF with EF less than 20%, stroke, CKD stage III, seizure disorder, HTN, HLD, obesity, OSA, and polysubstance abuse who presented to [facility] today with increased SOB, chest pain, and left posterior arm pain.   1. Elevated troponin: -Likely chronically elevated in the setting of his CKD stage III unabler to exclude underlying ischemia  history troponin elevation in this range previously   [facility]  scheduled to evaluate for high risk ischemia. patient prefers this over cardiac cath givebn renal dysfunction -Can discontinue heparin gtt at this time  2. Chronic systolic CHF: -Echo c/w previous study back in April 2014 with EF <20% -He reportedly has been compliant with medical therapy over this time -Would have EP see him as an outpatient for possible AICD, he has been on optimal medical therapy since 10/2012 without change in EF. He is hessitant to move forward with diagnostic cardiac  cath given CKD stage III -HF likely multifactorial including hypertensive heart disease as well as polysubstance abuse (cocaine and ETOH) -Continue Coreg 6.25 mg bid, Lasix 40 mg daily, enalapril 2.5 mg daily   3. HTN: -BP ok upon admission -Hydralazine 10 mg tid -Monitor  4. Seizure disorder: -Last seizure 06/2014 -Reports THC helps  5. Polysubstance abuse: -Last used cocaine 1 month ago -Continued THC abuse -Cessation is a must  6. CKD stage III: -Monitor SCr  7. Obesity/OSA   Electronic Signatures: Christell Faith M (PA-C)  (Signed 25-Feb-16 16:08)  Authored: General Aspect/Present Illness, History and Physical Exam, Review of System, Family & Social History, Past Medical History, Home Medications, Labs, EKG , Radiology, Allergies, Vital Signs/Nurse's Notes, Impression/Plan Ida Rogue (MD)  (Signed 26-Feb-16 15:51)  Authored: General Aspect/Present Illness, Impression/Plan  Co-Signer: General Aspect/Present Illness, History and Physical Exam, Review of System, Family & Social History, Past Medical History, Home Medications, Labs, EKG , Radiology, Allergies, Vital Signs/Nurse's Notes, Impression/Plan   Last Updated: 26-Feb-16 15:51 by Ida Rogue (MD)

## 2014-11-05 NOTE — H&P (Signed)
PATIENT NAME:  Kent Stanley, Kent Stanley MR#:  [id number] DATE OF BIRTH:  [birth year]  DATE OF ADMISSION:  08/30/2014  REFERRING PHYSICIAN: Gladstone Pih, MD    PRIMARY CARE PHYSICIAN: Dr. Corrin Parker however states that he really does not follow with a doctor.    CHIEF COMPLAINT: Chest pain.   HISTORY OF PRESENT ILLNESS: A 44 year old African-American gentleman with history of coronary artery disease, ischemic cardiomyopathy, EF of 20% presenting with chest pain. Describes one day duration of intermittent chest pain, central in location, radiating to the left arm, multiple episodes, intensity 8 to 9 out of 10, squeezing in quality, lasting 20-30 minutes in total. Took nitroglycerin without relief. Had a repeat episode today prior to arrival to the Emergency Department, worsening intensity as well as duration prompting him to present to the hospital for evaluation. Had initial ER evaluation and actually left against medical advice, returned, had a repeat troponin, which was trending upwards.  Currently chest pain free.   REVIEW OF SYSTEMS:   CONSTITUTIONAL: Denies fevers, chills, fatigue, weakness.  EYES: Denies blurred vision, double vision, or eye pain.  EARS, NOSE, THROAT: Denies tinnitus, ear pain, hearing loss.  RESPIRATORY:  Denies cough, wheeze, shortness of breath.    CARDIOVASCULAR: Positive chest pain as described above.  Denies any orthopnea, edema.  GASTROINTESTINAL: Denies nausea, vomiting, diarrhea, abdominal pain.  GENITOURINARY: Denies diarrhea or hematuria.  ENDOCRINE: Denies nocturia, thyroid problems. HEMATOLOGIC AND LYMPHATIC:  Denies easy bruising or bleeding.  SKIN: Denies rash or lesion.  MUSCULOSKELETAL: Denies pain in neck, back, shoulder, knees, hips or arthritic symptoms.  NEUROLOGIC: Denies paralysis, paresthesias.  PSYCHIATRIC: Denies anxiety or depressive symptoms.  Otherwise full review of systems performed by me is negative.    PAST MEDICAL HISTORY: Includes coronary  artery disease, ischemic cardiomyopathy, EF of 20%, history of CVA without residual neurological deficit, and seizure disorder.   SOCIAL HISTORY: Positive for tobacco use as well as occasional alcohol use. Denies any drug use.   FAMILY HISTORY: Positive for coronary artery disease.    ALLERGIES: No known drug allergies.   HOME MEDICATIONS: Include aspirin 81 mg p.o. daily, enalapril 2.5 mg p.o. daily, carbamazepine  b.i.d., lovastatin 20 mg p.o. at bedtime, Coreg 6.25 mg p.o. b.i.d., Lasix 40 mg p.o. daily, hydralazine 10 mg p.o. 3 times daily. Does state medical noncompliance.   PHYSICAL EXAMINATION:  VITAL SIGNS: Temperature 98, heart rate 98, respirations 20, blood pressure 128/86, saturation 96% on room air. Weight 147.8 kg, BMI of 44.5.  GENERAL: Obese African American gentleman currently in no acute distress.  HEAD: Normocephalic, atraumatic.  EYES: Pupils equal, round, react to light. Extraocular muscles intact. No scleral icterus.  MOUTH: Moist mucosal membrane. Dentition intact. No abscess noted.  EAR, NOSE, THROAT: Clear without exudates. No external lesions.  NECK: Supple. No thyromegaly. No nodules. No JVD.  PULMONARY: Clear to auscultation bilaterally without wheezes, rales, rhonchi. No use of accessory muscles. Good respiratory effort.  CHEST: Nontender to palpation.  CARDIOVASCULAR: S1, S2, regular rate and rhythm. No murmurs, rubs or gallops. No edema. Pedal pulses 2+ bilaterally. GASTROINTESTINAL: Soft, nontender, nondistended. Positive bowel sounds. No hepatosplenomegaly.  MUSCULOSKELETAL: No swelling, clubbing, edema. Range of motion full in all extremities.  NEUROLOGIC: Cranial nerves II through XII intact. No gross focal neurological deficits. Sensation intact. Reflexes intact.  SKIN: No ulceration, lesions, rash, or cyanosis. Skin warm and dry. Turgor intact.  PSYCHIATRIC: Mood and affect within normal limits. The patient is awake, alert, oriented x 3. Insight and  judgment  intact.   LABORATORY DATA: Sodium of 143, potassium 4.1, chloride 106, bicarbonate 27, BUN 22, creatinine 2, and glucose is 91. Troponin 0.13, trending upwards 0.16. WBC 8.4, hemoglobin 13.8, platelets of 172. Chest x-ray performed which reveals no acute cardiopulmonary process. EKG performed which reveals normal sinus rhythm. No ST, T wave abnormalities.   ASSESSMENT AND PLAN: A 44 year old African-American gentleman with history of coronary artery disease, ischemic cardiomyopathy, ejection fraction 20% presenting with chest pain.   1.  Non-ST elevation myocardial infarction as indicated by chest pain and an upward trending troponin. Initiate aspirin and statin therapy. Trend cardiac enzymes, place on telemetry, initiate heparin drip for anticoagulation. Check a transthoracic echocardiogram. Continue beta blockade and ACE inhibitor. We will consult cardiology.  2.  Seizure disorder. Continue with Tegretol.  3.  Venous thromboembolic prophylaxis , on heparin drip.   CODE STATUS:  The patient is full code.    TIME SPENT:  45 minutes.   ____________________________ Cletis Athens. Georgi Navarrete, MD dkh:AT D: 08/30/2014 23:16:38 ET T: 08/30/2014 23:53:52 ET JOB#: [id number]  cc: Cletis Athens. Zakayla Martinec, MD, <Dictator> Maxemiliano Riel Synetta Shadow MD ELECTRONICALLY SIGNED 08/31/2014 3:15

## 2014-11-25 ENCOUNTER — Other Ambulatory Visit: Payer: Self-pay | Admitting: Cardiovascular Disease

## 2014-12-25 ENCOUNTER — Ambulatory Visit (INDEPENDENT_AMBULATORY_CARE_PROVIDER_SITE_OTHER): Payer: BLUE CROSS/BLUE SHIELD | Admitting: Family Medicine

## 2014-12-25 ENCOUNTER — Encounter: Payer: Self-pay | Admitting: Family Medicine

## 2014-12-25 VITALS — BP 146/90 | HR 98 | Temp 98.0°F | Resp 16 | Ht 72.0 in | Wt 325.0 lb

## 2014-12-25 DIAGNOSIS — N451 Epididymitis: Secondary | ICD-10-CM | POA: Diagnosis not present

## 2014-12-25 DIAGNOSIS — E785 Hyperlipidemia, unspecified: Secondary | ICD-10-CM

## 2014-12-25 DIAGNOSIS — I10 Essential (primary) hypertension: Secondary | ICD-10-CM | POA: Diagnosis not present

## 2014-12-25 LAB — POCT URINALYSIS DIPSTICK
Bilirubin, UA: NEGATIVE
Blood, UA: NEGATIVE
Glucose, UA: NEGATIVE
Ketones, UA: NEGATIVE
LEUKOCYTES UA: NEGATIVE
Nitrite, UA: NEGATIVE
PROTEIN UA: NEGATIVE
Spec Grav, UA: 1.015
UROBILINOGEN UA: NEGATIVE
pH, UA: 5

## 2014-12-25 MED ORDER — DOXYCYCLINE HYCLATE 100 MG PO TABS
100.0000 mg | ORAL_TABLET | Freq: Two times a day (BID) | ORAL | Status: DC
Start: 2014-12-25 — End: 2015-08-12

## 2014-12-25 MED ORDER — TRAMADOL HCL 50 MG PO TABS: 50.0000 mg | ORAL_TABLET | Freq: Four times a day (QID) | ORAL | Status: AC | PRN

## 2014-12-25 MED ORDER — LOVASTATIN 20 MG PO TABS: 20.0000 mg | ORAL_TABLET | Freq: Every day | ORAL | Status: DC

## 2014-12-25 NOTE — Patient Instructions (Signed)
Groin pain: Take twice daily for 2 weeks. Use ultram sparingly for pain. Ice packs as needed. We will refer you to urology for evaluation.   If you develop: severe pain, nausea, vomiting, blood in your urine or other concerning symptoms, please seek immediate medical attention.

## 2014-12-25 NOTE — Assessment & Plan Note (Signed)
Elevated in the office today. Likely due to pain. Pt instructed to continue to check and home and call if it remains elevated > 150/90.

## 2014-12-25 NOTE — Progress Notes (Signed)
Subjective:    Patient ID: Kent Stanley., male    DOB: [birth year], 44 y.o.   MRN: [id number]  HPI: Kent Stanley. is a 44 y.o. male presenting on 12/25/2014 for Groin Pain   HPI  Pt presents for follow-up of groin pain. He was seen about 1 month ago for >2 mos of R groin pain following a cath. Evaluated by cardiology several times and they referred him to primary care. Retroperitoneal bleed was ruled out by cardiology. Treated for epididymitis at last visit due to point tenderness and swelling. Pt reported symptoms improved on antibiotics but returned shortly after finishing the medication. He reports no symptoms with urinating. But pain that is 9/10 starting in groin and radiating to flank. No blood in urine. No fevers or chills. Pt denies sexual activity and last check for GC/ chlamydia was negative. Pt denies penile pain or discharge.   Pt is also requesting a refill of his cholesterol medications. He lost the refill number. Will be sent to pharmacy today.   Past Medical History  Diagnosis Date  . Seizure disorder   . History of traumatic injury of head   . Stroke   . Chronic kidney disease   . Irregular heart beat   . CHF (congestive heart failure)   . Chronic systolic heart failure April of 2014    Ejection fraction less than 20%  . Hyperlipidemia   . Hypertension   . Obesity     Current Outpatient Prescriptions on File Prior to Visit  Medication Sig  . aspirin EC 81 MG tablet Take 1 tablet (81 mg total) by mouth daily.  . carvedilol (COREG) 6.25 MG tablet TAKE ONE TABLET BY MOUTH TWICE DAILY WITH A MEAL  . enalapril (VASOTEC) 5 MG tablet Take 1 tablet (5 mg total) by mouth daily.  . furosemide (LASIX) 40 MG tablet Take 1 tablet (40 mg total) by mouth 2 (two) times daily.  . sildenafil (VIAGRA) 25 MG tablet Take 1 tablet (25 mg total) by mouth daily as needed for erectile dysfunction.   No current facility-administered medications on file prior to visit.     Review of Systems  Constitutional: Negative for fever and chills.  Respiratory: Negative for chest tightness, shortness of breath and wheezing.   Cardiovascular: Negative for chest pain.  Gastrointestinal: Negative.   Endocrine: Negative for cold intolerance, heat intolerance, polydipsia, polyphagia and polyuria.  Genitourinary: Positive for flank pain and testicular pain. Negative for dysuria, hematuria, decreased urine volume, discharge, penile swelling, scrotal swelling and penile pain.  Neurological: Negative for light-headedness, numbness and headaches.  Psychiatric/Behavioral: Negative.    Per HPI unless specifically indicated above     Objective:    BP 146/90 mmHg  Pulse 98  Temp(Src) 98 F (36.7 C) (Oral)  Resp 16  Ht 6' (1.829 m)  Wt 325 lb (147.419 kg)  BMI 44.07 kg/m2  Wt Readings from Last 3 Encounters:  12/25/14 325 lb (147.419 kg)  10/31/14 325 lb 12 oz (147.759 kg)  10/26/14 326 lb (147.873 kg)    Physical Exam  Constitutional: He appears well-developed and well-nourished. No distress.  Cardiovascular: Normal rate and regular rhythm.  Exam reveals no gallop and no friction rub.   No murmur heard. Abdominal: Hernia confirmed negative in the right inguinal area and confirmed negative in the left inguinal area.  Genitourinary: Penis normal. Rectal exam shows no mass, no tenderness and anal tone normal. Prostate is not enlarged and not tender. Cremasteric  reflex is present. Right testis shows tenderness. Right testis shows no mass and no swelling. Left testis shows no mass, no swelling and no tenderness. Uncircumcised.  Skin: He is not diaphoretic.   Results for orders placed or performed in visit on 12/25/14  POCT urinalysis dipstick  Result Value Ref Range   Color, UA yellow    Clarity, UA clear    Glucose, UA negative    Bilirubin, UA negative    Ketones, UA negative    Spec Grav, UA 1.015    Blood, UA negative    pH, UA 5.0    Protein, UA negative     Urobilinogen, UA negative    Nitrite, UA negative    Leukocytes, UA Negative Negative      Assessment & Plan:   Problem List Items Addressed This Visit      Cardiovascular and Mediastinum   Hypertension    Elevated in the office today. Likely due to pain. Pt instructed to continue to check and home and call if it remains elevated > 150/90.       Relevant Medications   lovastatin (MEVACOR) 20 MG tablet     Genitourinary   Epididymitis - Primary    Groin pain is likely urological related. Retroperitoneal bleed r/o by cardiology. Treat for epididymitis again. Refer to urology for evaluation.       Relevant Medications   doxycycline (VIBRA-TABS) 100 MG tablet   traMADol (ULTRAM) 50 MG tablet   Other Relevant Orders   POCT urinalysis dipstick (Completed)   Ambulatory referral to Urology     Other   Hyperlipidemia    Refilled medications today.       Relevant Medications   lovastatin (MEVACOR) 20 MG tablet      Meds ordered this encounter  Medications  . doxycycline (VIBRA-TABS) 100 MG tablet    Sig: Take 1 tablet (100 mg total) by mouth 2 (two) times daily.    Dispense:  28 tablet    Refill:  0    Order Specific Question:  Supervising Provider    Answer:  Janeann Forehand [phone]  . lovastatin (MEVACOR) 20 MG tablet    Sig: Take 1 tablet (20 mg total) by mouth at bedtime.    Dispense:  30 tablet    Refill:  6    Order Specific Question:  Supervising Provider    Answer:  Janeann Forehand [phone]  . traMADol (ULTRAM) 50 MG tablet    Sig: Take 1 tablet (50 mg total) by mouth every 6 (six) hours as needed.    Dispense:  16 tablet    Refill:  0    Order Specific Question:  Supervising Provider    Answer:  Janeann Forehand [[id number]]      Follow up plan: Return if symptoms worsen or fail to improve.

## 2014-12-25 NOTE — Assessment & Plan Note (Signed)
Groin pain is likely urological related. Retroperitoneal bleed r/o by cardiology. Treat for epididymitis again. Refer to urology for evaluation.

## 2014-12-25 NOTE — Assessment & Plan Note (Signed)
Refilled medications today

## 2015-01-01 ENCOUNTER — Telehealth: Payer: Self-pay | Admitting: Family Medicine

## 2015-01-12 ENCOUNTER — Ambulatory Visit: Payer: BLUE CROSS/BLUE SHIELD | Admitting: Urology

## 2015-02-01 ENCOUNTER — Ambulatory Visit: Payer: BLUE CROSS/BLUE SHIELD | Admitting: Cardiovascular Disease

## 2015-02-01 ENCOUNTER — Encounter: Payer: Self-pay | Admitting: *Deleted

## 2015-03-29 ENCOUNTER — Ambulatory Visit: Payer: BLUE CROSS/BLUE SHIELD | Admitting: Family Medicine

## 2015-04-18 NOTE — Telephone Encounter (Signed)
Error

## 2015-05-05 ENCOUNTER — Other Ambulatory Visit: Payer: Self-pay | Admitting: Cardiovascular Disease

## 2015-05-28 ENCOUNTER — Ambulatory Visit: Payer: BLUE CROSS/BLUE SHIELD | Admitting: Family Medicine

## 2015-07-02 ENCOUNTER — Other Ambulatory Visit: Payer: Self-pay | Admitting: Cardiovascular Disease

## 2015-08-12 ENCOUNTER — Emergency Department: Payer: BLUE CROSS/BLUE SHIELD

## 2015-08-12 ENCOUNTER — Encounter: Payer: Self-pay | Admitting: Emergency Medicine

## 2015-08-12 ENCOUNTER — Inpatient Hospital Stay: Payer: BLUE CROSS/BLUE SHIELD

## 2015-08-12 ENCOUNTER — Inpatient Hospital Stay
Admission: EM | Admit: 2015-08-12 | Discharge: 2015-08-13 | DRG: 065 | Disposition: A | Discharge status: Home / Self Care | Payer: BLUE CROSS/BLUE SHIELD | Attending: Internal Medicine | Admitting: Internal Medicine

## 2015-08-12 DIAGNOSIS — F17210 Nicotine dependence, cigarettes, uncomplicated: Secondary | ICD-10-CM | POA: Diagnosis present

## 2015-08-12 DIAGNOSIS — I130 Hypertensive heart and chronic kidney disease with heart failure and stage 1 through stage 4 chronic kidney disease, or unspecified chronic kidney disease: Secondary | ICD-10-CM | POA: Diagnosis present

## 2015-08-12 DIAGNOSIS — E1122 Type 2 diabetes mellitus with diabetic chronic kidney disease: Secondary | ICD-10-CM | POA: Diagnosis present

## 2015-08-12 DIAGNOSIS — Z79899 Other long term (current) drug therapy: Secondary | ICD-10-CM

## 2015-08-12 DIAGNOSIS — Z8249 Family history of ischemic heart disease and other diseases of the circulatory system: Secondary | ICD-10-CM

## 2015-08-12 DIAGNOSIS — G40909 Epilepsy, unspecified, not intractable, without status epilepticus: Secondary | ICD-10-CM | POA: Diagnosis present

## 2015-08-12 DIAGNOSIS — N183 Chronic kidney disease, stage 3 (moderate): Secondary | ICD-10-CM | POA: Diagnosis present

## 2015-08-12 DIAGNOSIS — Z808 Family history of malignant neoplasm of other organs or systems: Secondary | ICD-10-CM

## 2015-08-12 DIAGNOSIS — Z833 Family history of diabetes mellitus: Secondary | ICD-10-CM | POA: Diagnosis not present

## 2015-08-12 DIAGNOSIS — R29810 Facial weakness: Secondary | ICD-10-CM | POA: Diagnosis present

## 2015-08-12 DIAGNOSIS — E669 Obesity, unspecified: Secondary | ICD-10-CM | POA: Diagnosis present

## 2015-08-12 DIAGNOSIS — Z6841 Body Mass Index (BMI) 40.0 and over, adult: Secondary | ICD-10-CM

## 2015-08-12 DIAGNOSIS — I639 Cerebral infarction, unspecified: Secondary | ICD-10-CM | POA: Diagnosis not present

## 2015-08-12 DIAGNOSIS — Z7982 Long term (current) use of aspirin: Secondary | ICD-10-CM | POA: Diagnosis not present

## 2015-08-12 DIAGNOSIS — G8194 Hemiplegia, unspecified affecting left nondominant side: Secondary | ICD-10-CM | POA: Diagnosis present

## 2015-08-12 DIAGNOSIS — I5022 Chronic systolic (congestive) heart failure: Secondary | ICD-10-CM | POA: Diagnosis present

## 2015-08-12 DIAGNOSIS — E785 Hyperlipidemia, unspecified: Secondary | ICD-10-CM | POA: Diagnosis present

## 2015-08-12 LAB — CBC
HEMATOCRIT: 43.5 % (ref 40.0–52.0)
HEMOGLOBIN: 14.2 g/dL (ref 13.0–18.0)
MCH: 31.6 pg (ref 26.0–34.0)
MCHC: 32.7 g/dL (ref 32.0–36.0)
MCV: 96.6 fL (ref 80.0–100.0)
Platelets: 191 10*3/uL (ref 150–440)
RBC: 4.5 MIL/uL (ref 4.40–5.90)
RDW: 14.6 % — ABNORMAL HIGH (ref 11.5–14.5)
WBC: 9.5 10*3/uL (ref 3.8–10.6)

## 2015-08-12 LAB — PROTIME-INR
INR: 1
Prothrombin Time: 13.4 seconds (ref 11.4–15.0)

## 2015-08-12 LAB — COMPREHENSIVE METABOLIC PANEL
ALBUMIN: 3.7 g/dL (ref 3.5–5.0)
ALK PHOS: 44 U/L (ref 38–126)
ALT: 22 U/L (ref 17–63)
ANION GAP: 4 — AB (ref 5–15)
AST: 24 U/L (ref 15–41)
BILIRUBIN TOTAL: 0.6 mg/dL (ref 0.3–1.2)
BUN: 25 mg/dL — AB (ref 6–20)
CALCIUM: 8.9 mg/dL (ref 8.9–10.3)
CO2: 33 mmol/L — AB (ref 22–32)
Chloride: 101 mmol/L (ref 101–111)
Creatinine, Ser: 1.89 mg/dL — ABNORMAL HIGH (ref 0.61–1.24)
GFR calc Af Amer: 48 mL/min — ABNORMAL LOW (ref >60)
GFR calc non Af Amer: 41 mL/min — ABNORMAL LOW (ref >60)
GLUCOSE: 176 mg/dL — AB (ref 65–99)
Potassium: 3.9 mmol/L (ref 3.5–5.1)
Sodium: 138 mmol/L (ref 135–145)
TOTAL PROTEIN: 7.6 g/dL (ref 6.5–8.1)

## 2015-08-12 LAB — LIPID PANEL
Cholesterol: 190 mg/dL (ref 0–200)
HDL: 31 mg/dL — ABNORMAL LOW (ref >40)
LDL CALC: 127 mg/dL — AB (ref 0–99)
Total CHOL/HDL Ratio: 6.1 RATIO
Triglycerides: 162 mg/dL — ABNORMAL HIGH (ref <150)
VLDL: 32 mg/dL (ref 0–40)

## 2015-08-12 LAB — APTT: APTT: 28 s (ref 24–36)

## 2015-08-12 LAB — HEMOGLOBIN A1C: HEMOGLOBIN A1C: 7.5 % — AB (ref 4.0–6.0)

## 2015-08-12 LAB — TROPONIN I: Troponin I: 0.08 ng/mL — ABNORMAL HIGH (ref <0.031)

## 2015-08-12 MED ORDER — ACETAMINOPHEN 650 MG RE SUPP: 650.0000 mg | Freq: Four times a day (QID) | RECTAL | Status: DC | PRN

## 2015-08-12 MED ORDER — SENNOSIDES-DOCUSATE SODIUM 8.6-50 MG PO TABS: 1.0000 | ORAL_TABLET | Freq: Every evening | ORAL | Status: DC | PRN

## 2015-08-12 MED ORDER — CLOPIDOGREL BISULFATE 75 MG PO TABS
75.0000 mg | ORAL_TABLET | Freq: Every day | ORAL | Status: DC
  Administered 2015-08-12 – 2015-08-13 (×2): 75 mg via ORAL
  Filled 2015-08-12 (×2): qty 1

## 2015-08-12 MED ORDER — FUROSEMIDE 40 MG PO TABS
40.0000 mg | ORAL_TABLET | Freq: Two times a day (BID) | ORAL | Status: DC
  Administered 2015-08-12 – 2015-08-13 (×4): 40 mg via ORAL
  Filled 2015-08-12 (×4): qty 1

## 2015-08-12 MED ORDER — ACETAMINOPHEN 325 MG PO TABS
650.0000 mg | ORAL_TABLET | Freq: Four times a day (QID) | ORAL | Status: DC | PRN
Start: 2015-08-12 — End: 2015-08-13

## 2015-08-12 MED ORDER — ENALAPRIL MALEATE 5 MG PO TABS
5.0000 mg | ORAL_TABLET | Freq: Every day | ORAL | Status: DC
  Administered 2015-08-12 – 2015-08-13 (×2): 5 mg via ORAL
  Filled 2015-08-12 (×3): qty 1

## 2015-08-12 MED ORDER — ASPIRIN 81 MG PO CHEW
324.0000 mg | CHEWABLE_TABLET | Freq: Once | ORAL | Status: AC
  Administered 2015-08-12: 324 mg via ORAL
  Filled 2015-08-12: qty 4

## 2015-08-12 MED ORDER — ONDANSETRON HCL 4 MG/2ML IJ SOLN
4.0000 mg | Freq: Once | INTRAMUSCULAR | Status: AC
  Administered 2015-08-12: 4 mg via INTRAVENOUS

## 2015-08-12 MED ORDER — ONDANSETRON HCL 4 MG/2ML IJ SOLN
INTRAMUSCULAR | Status: AC
  Administered 2015-08-12: 4 mg via INTRAVENOUS
  Filled 2015-08-12: qty 2

## 2015-08-12 MED ORDER — IOHEXOL 350 MG/ML SOLN
80.0000 mL | Freq: Once | INTRAVENOUS | Status: AC | PRN
  Administered 2015-08-12: 80 mL via INTRAVENOUS

## 2015-08-12 MED ORDER — HYDROCODONE-ACETAMINOPHEN 5-325 MG PO TABS: 1.0000 | ORAL_TABLET | ORAL | Status: DC | PRN

## 2015-08-12 MED ORDER — ENOXAPARIN SODIUM 40 MG/0.4ML ~~LOC~~ SOLN
40.0000 mg | Freq: Two times a day (BID) | SUBCUTANEOUS | Status: DC
  Administered 2015-08-12 – 2015-08-13 (×3): 40 mg via SUBCUTANEOUS
  Filled 2015-08-12 (×3): qty 0.4

## 2015-08-12 MED ORDER — PRAVASTATIN SODIUM 20 MG PO TABS
10.0000 mg | ORAL_TABLET | Freq: Every day | ORAL | Status: DC
  Administered 2015-08-12: 10 mg via ORAL
  Filled 2015-08-12: qty 1

## 2015-08-12 MED ORDER — CARVEDILOL 6.25 MG PO TABS
6.2500 mg | ORAL_TABLET | Freq: Two times a day (BID) | ORAL | Status: DC
  Administered 2015-08-12: 18:00:00 6.25 mg via ORAL
  Filled 2015-08-12: qty 1

## 2015-08-12 MED ORDER — ENOXAPARIN SODIUM 40 MG/0.4ML ~~LOC~~ SOLN: 40.0000 mg | SUBCUTANEOUS | Status: DC

## 2015-08-12 NOTE — ED Notes (Signed)
Report from Noel, RN 

## 2015-08-12 NOTE — ED Notes (Signed)
Left arm slightly weaker than right grip strength.

## 2015-08-12 NOTE — Evaluation (Signed)
Physical Therapy Evaluation Patient Details Name: Kent Stanley. MRN: [id number] DOB: 1970/07/25 Today's Date: 08/12/2015   History of Present Illness  Pt has a history of mild CVA ~2 years ago, now here with L side weakness/coordination issues.   Clinical Impression  Pt shows very good effort with PT, but is limited heavily by coordination and balance issues.  He also c/o significant headache and dizziness with most acts.  He reports frustration with not being able to make his L arm and leg do what he asks.  Pt able to do some limited ambulation but leans heavily to the L and generally is highly limited with what he can do.     Follow Up Recommendations CIR    Equipment Recommendations  Rolling walker with 5" wheels    Recommendations for Other Services       Precautions / Restrictions Precautions Precautions: Fall Restrictions Weight Bearing Restrictions: No      Mobility  Bed Mobility Overal bed mobility: Modified Independent             General bed mobility comments: Pt labored with the effort and needed heavy use of the rails.  Leaning to the L (but able to keep from falling sideways) in sitting.  Transfers Overall transfer level: Needs assistance Equipment used: Rolling walker (2 wheeled) Transfers: Sit to/from Stand Sit to Stand: Min assist         General transfer comment: Pt able to get to standing but is unsteady and again leans heavily to the L.  Showing decreased use of L U&LEs for balance.  Ambulation/Gait Ambulation/Gait assistance: Mod assist Ambulation Distance (Feet): 15 Feet Assistive device: Rolling walker (2 wheeled)       General Gait Details: Pt lacking confidence, becomes fatigued very quickly, c/o dizziness and ultimately is quite limited with ambulation during PT session.  He did not have any LOBs, but was leaning L and did not seem safe/confident/steady.   Stairs            Wheelchair Mobility    Modified Rankin (Stroke  Patients Only)       Balance Overall balance assessment: Needs assistance Sitting-balance support: Single extremity supported Sitting balance-Leahy Scale: Fair Sitting balance - Comments: Pt leaning to the L and looses balance to the L with nearly any combined sitting activity Postural control: Left lateral lean     Standing balance comment: Pt heavily reliant on the walker and leans L - no LOBs, but significant unsteadiness.                              Pertinent Vitals/Pain Pain Assessment:  (headache)    Home Living Family/patient expects to be discharged to:: Private residence Living Arrangements: Non-relatives/Friends Available Help at Discharge: Friend(s)                  Prior Function Level of Independence: Independent         Comments: Pt was working, driving, generally able to do all he needed     Hand Dominance        Extremity/Trunk Assessment   Upper Extremity Assessment: LUE deficits/detail       LUE Deficits / Details: Pt displays signficant L sided coordination issues with quality of movement, finger/thumb opposition and finger to nose.  He displays grossly 4-/5 strength t/o    Lower Extremity Assessment: LLE deficits/detail   LLE Deficits / Details: Pt with less  pronounced coordiantion issues in LLE, displays grossly 4-/5 strength     Communication   Communication: No difficulties  Cognition Arousal/Alertness: Awake/alert Behavior During Therapy: WFL for tasks assessed/performed Overall Cognitive Status: Within Functional Limits for tasks assessed                      General Comments      Exercises        Assessment/Plan    PT Assessment Patient needs continued PT services  PT Diagnosis Difficulty walking;Generalized weakness;Hemiplegia non-dominant side   PT Problem List Decreased strength;Decreased range of motion;Decreased activity tolerance;Decreased mobility;Decreased balance;Decreased knowledge of  use of DME;Decreased safety awareness;Decreased coordination  PT Treatment Interventions DME instruction;Gait training;Stair training;Functional mobility training;Therapeutic activities;Therapeutic exercise;Balance training;Neuromuscular re-education;Patient/family education   PT Goals (Current goals can be found in the Care Plan section) Acute Rehab PT Goals Patient Stated Goal: "I want to go home man, but I don't know" PT Goal Formulation: With patient Time For Goal Achievement: 08/26/15 Potential to Achieve Goals: Fair    Frequency 7X/week   Barriers to discharge        Co-evaluation               End of Session Equipment Utilized During Treatment: Gait belt Activity Tolerance: Patient limited by fatigue Patient left: with bed alarm set;with call bell/phone within reach           Time: 8469-6295 PT Time Calculation (min) (ACUTE ONLY): 17 min   Charges:   PT Evaluation $PT Eval Moderate Complexity: 1 Procedure     PT G Codes:       Kent Stanley, PT, DPT [phone]  Kent Stanley 08/12/2015, 6:06 PM

## 2015-08-12 NOTE — Consult Note (Addendum)
Referring Physician: Mody    Chief Complaint: Left sided weakness   HPI: Kent Stanley. is an 45 y.o. male who reports going to bed normal. Awakened early this morning and noted left sided weakness and inability to walk.  His roommate was called at  that time and EMS was notified.  On presentation symptoms seemed to be improving.  Patient was evaluated by [facility] and due to improvement in symptoms patient was not considered a tPA candidatel  NIHSS of 2.   Date last known well:  08/12/2015 Time last known well: Time: 01:00 tPA Given: No: Improvement in symptoms  Modified Rankin: Rankin Score=0  Past Medical History  Diagnosis Date  . Seizure disorder (HCC)   . History of traumatic injury of head   . Stroke (HCC)   . Chronic kidney disease   . Irregular heart beat   . CHF (congestive heart failure) (HCC)   . Chronic systolic heart failure (HCC) April of 2014    Ejection fraction less than 20%  . Hyperlipidemia   . Hypertension   . Obesity     Past Surgical History  Procedure Laterality Date  . Cardiac catheterization      [facility]    Family History  Problem Relation Age of Onset  . Diabetes Mother   . Brain cancer Mother   . Heart attack Mother   . Hypertension Father   . Diabetes Father   . Heart disease Father   . Hyperlipidemia Father   . Heart attack Father   . Heart failure Father    Social History:  reports that he has been smoking Cigarettes.  He has smoked for the past 10 years. He does not have any smokeless tobacco history on file. He reports that he drinks alcohol. He reports that he does not use illicit drugs.  Allergies:  Allergies  Allergen Reactions  . Tomato Rash    Medications:  I have reviewed the patient's current medications. Prior to Admission:  Prescriptions prior to admission  Medication Sig Dispense Refill Last Dose  . aspirin EC 81 MG tablet Take 1 tablet (81 mg total) by mouth daily. 90 tablet 3 08/11/2015 at Unknown time  . carvedilol  (COREG) 6.25 MG tablet TAKE ONE TABLET BY MOUTH TWICE DAILY WITH A MEAL 60 tablet 3 08/11/2015 at 2000  . enalapril (VASOTEC) 5 MG tablet Take 1 tablet (5 mg total) by mouth daily. 30 tablet 6 08/11/2015 at Unknown time  . furosemide (LASIX) 40 MG tablet TAKE ONE TABLET BY MOUTH TWICE DAILY 60 tablet 3 08/11/2015 at Unknown time  . lovastatin (MEVACOR) 20 MG tablet Take 1 tablet (20 mg total) by mouth at bedtime. 30 tablet 6 08/11/2015 at Unknown time  . sildenafil (VIAGRA) 25 MG tablet Take 1 tablet (25 mg total) by mouth daily as needed for erectile dysfunction. 6 tablet 2 Past Month at Unknown time   Scheduled: . carvedilol  6.25 mg Oral BID WC  . clopidogrel  75 mg Oral Daily  . enalapril  5 mg Oral Daily  . enoxaparin (LOVENOX) injection  40 mg Subcutaneous BID  . furosemide  40 mg Oral BID  . pravastatin  10 mg Oral q1800    ROS: History obtained from the patient  General ROS: negative for - chills, fatigue, fever, night sweats, weight gain or weight loss Psychological ROS: negative for - behavioral disorder, hallucinations, memory difficulties, mood swings or suicidal ideation Ophthalmic ROS: negative for - blurry vision, double vision, eye  pain or loss of vision ENT ROS: negative for - epistaxis, nasal discharge, oral lesions, sore throat, tinnitus or vertigo Allergy and Immunology ROS: negative for - hives or itchy/watery eyes Hematological and Lymphatic ROS: negative for - bleeding problems, bruising or swollen lymph nodes Endocrine ROS: negative for - galactorrhea, hair pattern changes, polydipsia/polyuria or temperature intolerance Respiratory ROS: negative for - cough, hemoptysis, shortness of breath or wheezing Cardiovascular ROS: negative for - chest pain, dyspnea on exertion, edema or irregular heartbeat Gastrointestinal ROS: negative for - abdominal pain, diarrhea, hematemesis, nausea/vomiting or stool incontinence Genito-Urinary ROS: negative for - dysuria, hematuria,  incontinence or urinary frequency/urgency Musculoskeletal ROS: negative for - joint swelling or muscular weakness Neurological ROS: as noted in HPI Dermatological ROS: negative for rash and skin lesion changes  Physical Examination: Blood pressure 140/85, pulse 94, temperature 98.7 F (37.1 C), temperature source Oral, resp. rate 20, height 6' (1.829 m), weight 151.955 kg (335 lb), SpO2 97 %.  HEENT-  Normocephalic, no lesions, without obvious abnormality.  Normal external eye and conjunctiva.  Normal TM's bilaterally.  Normal auditory canals and external ears. Normal external nose, mucus membranes and septum.  Normal pharynx. Cardiovascular- S1, S2 normal, pulses palpable throughout   Lungs- chest clear, no wheezing, rales, normal symmetric air entry Abdomen- soft, non-tender; bowel sounds normal; no masses,  no organomegaly Extremities- no edema Lymph-no adenopathy palpable Musculoskeletal-no joint tenderness, deformity or swelling Skin-warm and dry, no hyperpigmentation, vitiligo, or suspicious lesions  Neurological Examination Mental Status: Alert, oriented, thought content appropriate.  Speech fluent without evidence of aphasia.  Able to follow 3 step commands without difficulty. Cranial Nerves: II: Discs flat bilaterally; Visual fields grossly normal, pupils equal, round, reactive to light and accommodation III,IV, VI: left ptosis, extra-ocular motions intact bilaterally V,VII: left facial droop, facial light touch sensation normal bilaterally VIII: hearing normal bilaterally IX,X: gag reflex present XI: bilateral shoulder shrug XII: midline tongue extension Motor: Right : Upper extremity   5/5    Left:     Upper extremity   5-/5, no drift  Lower extremity   5/5     Lower extremity   4/5 Tone and bulk:normal tone throughout; no atrophy noted Sensory: Pinprick and light touch decreased in the LLE  Deep Tendon Reflexes: 2+ and symmetric with absent AJ's  bilaterally Plantars: Right: downgoing   Left: downgoing Cerebellar: Dysmetria with finger-to-nose and heel-to-shin testing on the left Gait: not tested due to safety concerns   Laboratory Studies:  Basic Metabolic Panel:  Recent Labs Lab 08/12/15 0455  NA 138  K 3.9  CL 101  CO2 33*  GLUCOSE 176*  BUN 25*  CREATININE 1.89*  CALCIUM 8.9    Liver Function Tests:  Recent Labs Lab 08/12/15 0455  AST 24  ALT 22  ALKPHOS 44  BILITOT 0.6  PROT 7.6  ALBUMIN 3.7   No results for input(s): LIPASE, AMYLASE in the last 168 hours. No results for input(s): AMMONIA in the last 168 hours.  CBC:  Recent Labs Lab 08/12/15 0455  WBC 9.5  HGB 14.2  HCT 43.5  MCV 96.6  PLT 191    Cardiac Enzymes:  Recent Labs Lab 08/12/15 0455  TROPONINI 0.08*    BNP: Invalid input(s): POCBNP  CBG: No results for input(s): GLUCAP in the last 168 hours.  Microbiology: No results found for this or any previous visit.  Coagulation Studies:  Recent Labs  08/12/15 0455  LABPROT 13.4  INR 1.00    Urinalysis: No results  for input(s): COLORURINE, LABSPEC, PHURINE, GLUCOSEU, HGBUR, BILIRUBINUR, KETONESUR, PROTEINUR, UROBILINOGEN, NITRITE, LEUKOCYTESUR in the last 168 hours.  Invalid input(s): APPERANCEUR  Lipid Panel:    Component Value Date/Time   CHOL 190 08/12/2015 0455   CHOL 183 10/14/2012 0354   TRIG 162* 08/12/2015 0455   TRIG 79 10/14/2012 0354   HDL 31* 08/12/2015 0455   HDL 44 10/14/2012 0354   CHOLHDL 6.1 08/12/2015 0455   VLDL 32 08/12/2015 0455   VLDL 16 10/14/2012 0354   LDLCALC 127* 08/12/2015 0455   LDLCALC 123* 10/14/2012 0354    HgbA1C:  Lab Results  Component Value Date   HGBA1C 6.7* 10/13/2012    Urine Drug Screen:     Component Value Date/Time   LABOPIA NEGATIVE 10/13/2012 1422   LABBENZ NEGATIVE 10/13/2012 1422   AMPHETMU NEGATIVE 10/13/2012 1422   THCU NEGATIVE 10/13/2012 1422   LABBARB NEGATIVE 10/13/2012 1422    Alcohol Level:  No results for input(s): ETH in the last 168 hours.  Other results: EKG: normal sinus rhythm at 82 bpm.  Imaging: Ct Angio Head W/cm &/or Wo Cm  08/12/2015  CLINICAL DATA:  45 year old male with left side numbness and slurred speech since this morning. Suspicion of acute left superior cerebellar artery infarct on noncontrast head CT. Initial encounter. EXAM: CT ANGIOGRAPHY HEAD AND NECK TECHNIQUE: Multidetector CT imaging of the head and neck was performed using the standard protocol during bolus administration of intravenous contrast. Multiplanar CT image reconstructions and MIPs were obtained to evaluate the vascular anatomy. Carotid stenosis measurements (when applicable) are obtained utilizing NASCET criteria, using the distal internal carotid diameter as the denominator. CONTRAST:  80mL OMNIPAQUE IOHEXOL 350 MG/ML SOLN COMPARISON:  noncontrast head  CT 0508 hours today. FINDINGS: CTA NECK Skeleton: Large body habitus, quantum mottle artifact in the lower neck and chest. No acute osseous abnormality identified. Small left maxillary sinus mucous retention cyst. Other neck: Negative lung apices. No superior mediastinal lymphadenopathy. Thyroid detail obscured by a large body habitus. Larynx, pharynx, parapharyngeal spaces and retropharyngeal space (retropharyngeal course of both carotids) within normal limits. Sublingual space, submandibular glands, parotid glands, and orbit soft tissues within normal limits. No cervical lymphadenopathy. Aortic arch: 3 vessel arch configuration. No arch atherosclerosis is evident. No great vessel origin stenosis identified. Right carotid system: Negative right CCA, retropharyngeal course. Retropharyngeal right carotid bifurcation is normal. Negative cervical right ICA aside from tortuosity just below the skullbase. Left carotid system: Negative left CCA. Retropharyngeal left carotid bifurcation is normal. Tortuous cervical left ICA just. Below the skullbase Vertebral  arteries:No proximal subclavian artery stenosis. No definite vertebral artery origin stenosis, vertebral artery origins are not well visualized due to quantum mottle artifact, especially the left (series 11, image 166). The left vertebral artery is mildly dominant. Both V2 and V3 segments are patent. No V3 segment stenosis. CTA HEAD Posterior circulation: Dominant distal left vertebral artery, the right terminates in PICA. No distal vertebral artery stenosis. Patent left PICA origin at the skullbase. The left supplies the basilar. Both AICA origins are patent. No basilar artery stenosis. Both SCA origins are patent (series 9 image 239). Fetal type bilateral PCA origins. Bilateral PCA branches are within normal limits. Anterior circulation: Both ICA siphons are patent and mildly dolichoectatic. No siphon stenosis. Normal ophthalmic and posterior communicating artery origins. Normal carotid termini. Normal MCA and ACA origins. Mildly dominant left A1 segment. Anterior communicating artery and bilateral ACA branches are within normal limits. Left MCA M1 segment, bifurcation, and left MCA  branches are within normal limits. Right MCA M1 segment, bifurcation, and right MCA branches are within normal limits. Venous sinuses: Patent. Anatomic variants: Mildly dominant left vertebral artery. Fetal type bilateral PCA origins. Mildly dominant left A1 segment. Delayed phase: Hypodensity re - identified in the left SCA territory and stable. No intracranial hemorrhage or mass effect. No abnormal enhancement identified. IMPRESSION: 1. No emergent large vessel occlusion. Negative posterior circulation. Left SCA origin is patent. Poorly visualized vertebral artery origins and proximal segments due to large body habitus. No vertebral artery stenosis is evident. 2. Negative anterior circulation. Retropharyngeal cervical carotid arteries with ICA siphon dolichoectasia, and fetal type bilateral PCA origins. 3. Stable CT appearance of the  brain since 0508 hours today with wedge-shaped hypodensity in the left SCA territory suspicious for acute or subacute infarct. Electronically Signed   By: Odessa Fleming M.D.   On: 08/12/2015 07:18   Ct Head Wo Contrast  08/12/2015  CLINICAL DATA:  Left-sided weakness and slurred speech since this morning. EXAM: CT HEAD WITHOUT CONTRAST TECHNIQUE: Contiguous axial images were obtained from the base of the skull through the vertex without intravenous contrast. COMPARISON:  06/21/2014 FINDINGS: Skull and Sinuses:Negative for fracture or destructive process. The visualized mastoids, middle ears, and imaged paranasal sinuses are clear. Visualized orbits: Chronic proptosis without mass or extraocular muscle thickening. Brain: Hypodense appearance in the upper left cerebellum without volume loss. Evaluation of the cerebral cortex is limited by artifact. There are areas of anterior and high left frontal cortex which are indistinct, concerning for acute infarct, but indeterminate due to scan limitations. No definitive large vessel left MCA territory infarct to correlate with symptoms. No hyper dense vessel noted. No hemorrhage or hydrocephalus. Asymmetric lateral ventricles, stable and developmental. No midline shift. These results were called by telephone at the time of interpretation on 08/12/2015 at 5:20 am to Dr. Lucrezia Europe , who verbally acknowledged these results. IMPRESSION: 1. Recent appearing left superior cerebellar artery infarct. 2. Evaluation of left cerebral cortex is limited due to artifact, as above. 3. No acute hemorrhage. Electronically Signed   By: Marnee Spring M.D.   On: 08/12/2015 05:27   Ct Angio Neck W/cm &/or Wo/cm  08/12/2015  CLINICAL DATA:  45 year old male with left side numbness and slurred speech since this morning. Suspicion of acute left superior cerebellar artery infarct on noncontrast head CT. Initial encounter. EXAM: CT ANGIOGRAPHY HEAD AND NECK TECHNIQUE: Multidetector CT imaging of  the head and neck was performed using the standard protocol during bolus administration of intravenous contrast. Multiplanar CT image reconstructions and MIPs were obtained to evaluate the vascular anatomy. Carotid stenosis measurements (when applicable) are obtained utilizing NASCET criteria, using the distal internal carotid diameter as the denominator. CONTRAST:  80mL OMNIPAQUE IOHEXOL 350 MG/ML SOLN COMPARISON:  noncontrast head  CT 0508 hours today. FINDINGS: CTA NECK Skeleton: Large body habitus, quantum mottle artifact in the lower neck and chest. No acute osseous abnormality identified. Small left maxillary sinus mucous retention cyst. Other neck: Negative lung apices. No superior mediastinal lymphadenopathy. Thyroid detail obscured by a large body habitus. Larynx, pharynx, parapharyngeal spaces and retropharyngeal space (retropharyngeal course of both carotids) within normal limits. Sublingual space, submandibular glands, parotid glands, and orbit soft tissues within normal limits. No cervical lymphadenopathy. Aortic arch: 3 vessel arch configuration. No arch atherosclerosis is evident. No great vessel origin stenosis identified. Right carotid system: Negative right CCA, retropharyngeal course. Retropharyngeal right carotid bifurcation is normal. Negative cervical right ICA aside from tortuosity just  below the skullbase. Left carotid system: Negative left CCA. Retropharyngeal left carotid bifurcation is normal. Tortuous cervical left ICA just. Below the skullbase Vertebral arteries:No proximal subclavian artery stenosis. No definite vertebral artery origin stenosis, vertebral artery origins are not well visualized due to quantum mottle artifact, especially the left (series 11, image 166). The left vertebral artery is mildly dominant. Both V2 and V3 segments are patent. No V3 segment stenosis. CTA HEAD Posterior circulation: Dominant distal left vertebral artery, the right terminates in PICA. No distal  vertebral artery stenosis. Patent left PICA origin at the skullbase. The left supplies the basilar. Both AICA origins are patent. No basilar artery stenosis. Both SCA origins are patent (series 9 image 239). Fetal type bilateral PCA origins. Bilateral PCA branches are within normal limits. Anterior circulation: Both ICA siphons are patent and mildly dolichoectatic. No siphon stenosis. Normal ophthalmic and posterior communicating artery origins. Normal carotid termini. Normal MCA and ACA origins. Mildly dominant left A1 segment. Anterior communicating artery and bilateral ACA branches are within normal limits. Left MCA M1 segment, bifurcation, and left MCA branches are within normal limits. Right MCA M1 segment, bifurcation, and right MCA branches are within normal limits. Venous sinuses: Patent. Anatomic variants: Mildly dominant left vertebral artery. Fetal type bilateral PCA origins. Mildly dominant left A1 segment. Delayed phase: Hypodensity re - identified in the left SCA territory and stable. No intracranial hemorrhage or mass effect. No abnormal enhancement identified. IMPRESSION: 1. No emergent large vessel occlusion. Negative posterior circulation. Left SCA origin is patent. Poorly visualized vertebral artery origins and proximal segments due to large body habitus. No vertebral artery stenosis is evident. 2. Negative anterior circulation. Retropharyngeal cervical carotid arteries with ICA siphon dolichoectasia, and fetal type bilateral PCA origins. 3. Stable CT appearance of the brain since 0508 hours today with wedge-shaped hypodensity in the left SCA territory suspicious for acute or subacute infarct. Electronically Signed   By: Odessa Fleming M.D.   On: 08/12/2015 07:18   US Carotid Bilateral  08/12/2015  CLINICAL DATA:  CVA. EXAM: BILATERAL CAROTID DUPLEX ULTRASOUND TECHNIQUE: Demarus Cullens scale imaging, color Doppler and duplex ultrasound were performed of bilateral carotid and vertebral arteries in the neck.  COMPARISON:  None. FINDINGS: Criteria: Quantification of carotid stenosis is based on velocity parameters that correlate the residual internal carotid diameter with NASCET-based stenosis levels, using the diameter of the distal internal carotid lumen as the denominator for stenosis measurement. The following velocity measurements were obtained: RIGHT ICA:  78 cm/sec CCA:  95 cm/sec SYSTOLIC ICA/CCA RATIO:  0.8 DIASTOLIC ICA/CCA RATIO:  0.8 ECA:  98 cm/sec LEFT ICA:  70 cm/sec CCA:  106 cm/sec SYSTOLIC ICA/CCA RATIO:  0.7 DIASTOLIC ICA/CCA RATIO:  0.5 ECA:  106 cm/sec RIGHT CAROTID ARTERY: Right carotid arteries are patent without significant plaque or stenosis. Normal waveforms and velocities in the right internal carotid artery. RIGHT VERTEBRAL ARTERY: Antegrade flow and normal waveform in the right vertebral artery. LEFT CAROTID ARTERY: Left carotid arteries are patent without significant plaque or stenosis. Mild intimal thickening in the left common carotid artery. Normal waveforms and velocities in the left internal carotid artery. LEFT VERTEBRAL ARTERY: Antegrade flow and normal waveform in the left vertebral artery. IMPRESSION: Normal carotid artery examination. No significant carotid artery stenosis or plaque. Electronically Signed   By: Richarda Overlie M.D.   On: 08/12/2015 13:36    Assessment: 45 y.o. male presenting with mild left sided weakness and left dysmetria. Symptoms imporved after presentation therefore tPA not administered.  Head CT personally reviewed and shows an area of hypodensity in the left upper cerebellum.  No significant large vessel stenosis noted on CTA.  Patient on ASA at home.  Further work up recommended.    Stroke Risk Factors - hyperlipidemia, hypertension and smoking  Plan: 1. HgbA1c, fasting lipid panel 2. MRI of the brain without contrast 3. PT consult, OT consult, Speech consult 4. Echocardiogram 5. Smoking cessation counseling 6. Prophylactic therapy-Antiplatelet med:  Plavix - dose 75 mg daily 7. NPO until RN stroke swallow screen 8. Telemetry monitoring 9. Frequent neuro checks   Thana Farr, MD Neurology [phone] 08/12/2015, 4:56 PM

## 2015-08-12 NOTE — ED Notes (Signed)
Patient transported to CT 

## 2015-08-12 NOTE — H&P (Signed)
[facility] - [place] at [facility]   PATIENT NAME: Kent Stanley    MR#:  [id number]  DATE OF BIRTH:  08/26/1970  DATE OF ADMISSION:  08/12/2015  PRIMARY CARE PHYSICIAN: Filbert Berthold, NP   REQUESTING/REFERRING PHYSICIAN: Dr Zenda Alpers  CHIEF COMPLAINT:  Left side weakness   HISTORY OF PRESENT ILLNESS:  Kent Stanley  is a 45 y.o. male with a known history of CVA, CHF here with left sided weakness. Patient reports that his symptoms started this evening when his 20s the bathroom. He noted that he was stumbling when he was walking. He also reports left-sided upper and lower extremity weakness. He also reports speech difficulty. EMS reports that patient also had left-sided facial droop.  CT scan shows left superior cerebellar  artery infarct.  PAST MEDICAL HISTORY:   Past Medical History  Diagnosis Date  . Seizure disorder (HCC)   . History of traumatic injury of head   . Stroke (HCC)   . Chronic kidney disease   . Irregular heart beat   . CHF (congestive heart failure) (HCC)   . Chronic systolic heart failure (HCC) April of 2014    Ejection fraction less than 20%  . Hyperlipidemia   . Hypertension   . Obesity     PAST SURGICAL HISTORY:   None  SOCIAL HISTORY:   Social History  Substance Use Topics  . Smoking status: Current Some Day Smoker -- 10 years    Types: Cigarettes  . Smokeless tobacco: No  . Alcohol Use: Yes     Comment: socially    FAMILY HISTORY:   Family History  Problem Relation Age of Onset  . Diabetes Mother   . Brain cancer Mother   . Heart attack Mother   . Hypertension Father   . Diabetes Father   . Heart disease Father   . Hyperlipidemia Father   . Heart attack Father   . Heart failure Father     DRUG ALLERGIES:  No Known Allergies   REVIEW OF SYSTEMS:  CONSTITUTIONAL: No fever, fatigue or weakness.  EYES: No blurred or double vision.  EARS, NOSE, AND THROAT: No tinnitus or ear pain.  RESPIRATORY: No  cough, shortness of breath, wheezing or hemoptysis.  CARDIOVASCULAR: No chest pain, orthopnea, edema.  GASTROINTESTINAL: No nausea, vomiting, diarrhea or abdominal pain.  GENITOURINARY: No dysuria, hematuria.  ENDOCRINE: No polyuria, nocturia,  HEMATOLOGY: No anemia, easy bruising or bleeding SKIN: No rash or lesion. MUSCULOSKELETAL: No joint pain or arthritis.   NEUROLOGIC: left-sided weakness and speech difficulty.  PSYCHIATRY: No anxiety or depression.   MEDICATIONS AT HOME:   Prior to Admission medications   Medication Sig Start Date End Date Taking? Authorizing Provider  aspirin EC 81 MG tablet Take 1 tablet (81 mg total) by mouth daily. 10/03/14  Yes Iran Ouch, MD  carvedilol (COREG) 6.25 MG tablet TAKE ONE TABLET BY MOUTH TWICE DAILY WITH A MEAL 05/07/15  Yes Iran Ouch, MD  enalapril (VASOTEC) 5 MG tablet Take 1 tablet (5 mg total) by mouth daily. 10/27/14  Yes Iran Ouch, MD  furosemide (LASIX) 40 MG tablet TAKE ONE TABLET BY MOUTH TWICE DAILY 07/03/15  Yes Iran Ouch, MD  lovastatin (MEVACOR) 20 MG tablet Take 1 tablet (20 mg total) by mouth at bedtime. 12/25/14  Yes Amy Rusty Aus, NP  sildenafil (VIAGRA) 25 MG tablet Take 1 tablet (25 mg total) by mouth daily as needed for erectile dysfunction. 10/31/14  Yes Jerolyn Center  Argentina Donovan, MD      VITAL SIGNS:  Blood pressure 135/86, pulse 78, temperature 98.3 F (36.8 C), temperature source Oral, resp. rate 22, height 6' (1.829 m), weight 151.955 kg (335 lb), SpO2 91 %.  PHYSICAL EXAMINATION:  GENERAL:  45 y.o.-year-old patient lying in the bed with no acute distress.  EYES: Pupils equal, round, reactive to light and accommodation. No scleral icterus. Extraocular muscles intact.  HEENT: Head atraumatic, normocephalic. Oropharynx and nasopharynx clear.  NECK:  Supple, no jugular venous distention. No thyroid enlargement, no tenderness.  LUNGS: Normal breath sounds bilaterally, no wheezing, rales,rhonchi or  crepitation. No use of accessory muscles of respiration.  CARDIOVASCULAR: S1, S2 normal. No murmurs, rubs, or gallops.  ABDOMEN: Soft, nontender, nondistended. Bowel sounds present. No organomegaly or mass.  EXTREMITIES: No pedal edema, cyanosis, or clubbing.  NEUROLOGIC: left facial droop and left-sided 4 out of 5 upper and lower extremity strength sensation is intact  PSYCHIATRIC: The patient is alert and oriented x 3.  SKIN: No obvious rash, lesion, or ulcer.   LABORATORY PANEL:   CBC  Recent Labs Lab 08/12/15 0455  WBC 9.5  HGB 14.2  HCT 43.5  PLT 191   ------------------------------------------------------------------------------------------------------------------  Chemistries   Recent Labs Lab 08/12/15 0455  NA 138  K 3.9  CL 101  CO2 33*  GLUCOSE 176*  BUN 25*  CREATININE 1.89*  CALCIUM 8.9  AST 24  ALT 22  ALKPHOS 44  BILITOT 0.6   ------------------------------------------------------------------------------------------------------------------  Cardiac Enzymes  Recent Labs Lab 08/12/15 0455  TROPONINI 0.08*   ------------------------------------------------------------------------------------------------------------------  RADIOLOGY:  Ct Angio Head W/cm &/or Wo Cm  08/12/2015  CLINICAL DATA:  45 year old male with left side numbness and slurred speech since this morning. Suspicion of acute left superior cerebellar artery infarct on noncontrast head CT. Initial encounter. EXAM: CT ANGIOGRAPHY HEAD AND NECK TECHNIQUE: Multidetector CT imaging of the head and neck was performed using the standard protocol during bolus administration of intravenous contrast. Multiplanar CT image reconstructions and MIPs were obtained to evaluate the vascular anatomy. Carotid stenosis measurements (when applicable) are obtained utilizing NASCET criteria, using the distal internal carotid diameter as the denominator. CONTRAST:  80mL OMNIPAQUE IOHEXOL 350 MG/ML SOLN COMPARISON:   noncontrast head  CT 0508 hours today. FINDINGS: CTA NECK Skeleton: Large body habitus, quantum mottle artifact in the lower neck and chest. No acute osseous abnormality identified. Small left maxillary sinus mucous retention cyst. Other neck: Negative lung apices. No superior mediastinal lymphadenopathy. Thyroid detail obscured by a large body habitus. Larynx, pharynx, parapharyngeal spaces and retropharyngeal space (retropharyngeal course of both carotids) within normal limits. Sublingual space, submandibular glands, parotid glands, and orbit soft tissues within normal limits. No cervical lymphadenopathy. Aortic arch: 3 vessel arch configuration. No arch atherosclerosis is evident. No great vessel origin stenosis identified. Right carotid system: Negative right CCA, retropharyngeal course. Retropharyngeal right carotid bifurcation is normal. Negative cervical right ICA aside from tortuosity just below the skullbase. Left carotid system: Negative left CCA. Retropharyngeal left carotid bifurcation is normal. Tortuous cervical left ICA just. Below the skullbase Vertebral arteries:No proximal subclavian artery stenosis. No definite vertebral artery origin stenosis, vertebral artery origins are not well visualized due to quantum mottle artifact, especially the left (series 11, image 166). The left vertebral artery is mildly dominant. Both V2 and V3 segments are patent. No V3 segment stenosis. CTA HEAD Posterior circulation: Dominant distal left vertebral artery, the right terminates in PICA. No distal vertebral artery stenosis. Patent left PICA origin  at the skullbase. The left supplies the basilar. Both AICA origins are patent. No basilar artery stenosis. Both SCA origins are patent (series 9 image 239). Fetal type bilateral PCA origins. Bilateral PCA branches are within normal limits. Anterior circulation: Both ICA siphons are patent and mildly dolichoectatic. No siphon stenosis. Normal ophthalmic and posterior  communicating artery origins. Normal carotid termini. Normal MCA and ACA origins. Mildly dominant left A1 segment. Anterior communicating artery and bilateral ACA branches are within normal limits. Left MCA M1 segment, bifurcation, and left MCA branches are within normal limits. Right MCA M1 segment, bifurcation, and right MCA branches are within normal limits. Venous sinuses: Patent. Anatomic variants: Mildly dominant left vertebral artery. Fetal type bilateral PCA origins. Mildly dominant left A1 segment. Delayed phase: Hypodensity re - identified in the left SCA territory and stable. No intracranial hemorrhage or mass effect. No abnormal enhancement identified. IMPRESSION: 1. No emergent large vessel occlusion. Negative posterior circulation. Left SCA origin is patent. Poorly visualized vertebral artery origins and proximal segments due to large body habitus. No vertebral artery stenosis is evident. 2. Negative anterior circulation. Retropharyngeal cervical carotid arteries with ICA siphon dolichoectasia, and fetal type bilateral PCA origins. 3. Stable CT appearance of the brain since 0508 hours today with wedge-shaped hypodensity in the left SCA territory suspicious for acute or subacute infarct. Electronically Signed   By: Odessa Fleming M.D.   On: 08/12/2015 07:18   Ct Head Wo Contrast  08/12/2015  CLINICAL DATA:  Left-sided weakness and slurred speech since this morning. EXAM: CT HEAD WITHOUT CONTRAST TECHNIQUE: Contiguous axial images were obtained from the base of the skull through the vertex without intravenous contrast. COMPARISON:  06/21/2014 FINDINGS: Skull and Sinuses:Negative for fracture or destructive process. The visualized mastoids, middle ears, and imaged paranasal sinuses are clear. Visualized orbits: Chronic proptosis without mass or extraocular muscle thickening. Brain: Hypodense appearance in the upper left cerebellum without volume loss. Evaluation of the cerebral cortex is limited by artifact.  There are areas of anterior and high left frontal cortex which are indistinct, concerning for acute infarct, but indeterminate due to scan limitations. No definitive large vessel left MCA territory infarct to correlate with symptoms. No hyper dense vessel noted. No hemorrhage or hydrocephalus. Asymmetric lateral ventricles, stable and developmental. No midline shift. These results were called by telephone at the time of interpretation on 08/12/2015 at 5:20 am to Dr. Lucrezia Europe , who verbally acknowledged these results. IMPRESSION: 1. Recent appearing left superior cerebellar artery infarct. 2. Evaluation of left cerebral cortex is limited due to artifact, as above. 3. No acute hemorrhage. Electronically Signed   By: Marnee Spring M.D.   On: 08/12/2015 05:27   Ct Angio Neck W/cm &/or Wo/cm  08/12/2015  CLINICAL DATA:  45 year old male with left side numbness and slurred speech since this morning. Suspicion of acute left superior cerebellar artery infarct on noncontrast head CT. Initial encounter. EXAM: CT ANGIOGRAPHY HEAD AND NECK TECHNIQUE: Multidetector CT imaging of the head and neck was performed using the standard protocol during bolus administration of intravenous contrast. Multiplanar CT image reconstructions and MIPs were obtained to evaluate the vascular anatomy. Carotid stenosis measurements (when applicable) are obtained utilizing NASCET criteria, using the distal internal carotid diameter as the denominator. CONTRAST:  80mL OMNIPAQUE IOHEXOL 350 MG/ML SOLN COMPARISON:  noncontrast head  CT 0508 hours today. FINDINGS: CTA NECK Skeleton: Large body habitus, quantum mottle artifact in the lower neck and chest. No acute osseous abnormality identified. Small left maxillary sinus  mucous retention cyst. Other neck: Negative lung apices. No superior mediastinal lymphadenopathy. Thyroid detail obscured by a large body habitus. Larynx, pharynx, parapharyngeal spaces and retropharyngeal space (retropharyngeal  course of both carotids) within normal limits. Sublingual space, submandibular glands, parotid glands, and orbit soft tissues within normal limits. No cervical lymphadenopathy. Aortic arch: 3 vessel arch configuration. No arch atherosclerosis is evident. No great vessel origin stenosis identified. Right carotid system: Negative right CCA, retropharyngeal course. Retropharyngeal right carotid bifurcation is normal. Negative cervical right ICA aside from tortuosity just below the skullbase. Left carotid system: Negative left CCA. Retropharyngeal left carotid bifurcation is normal. Tortuous cervical left ICA just. Below the skullbase Vertebral arteries:No proximal subclavian artery stenosis. No definite vertebral artery origin stenosis, vertebral artery origins are not well visualized due to quantum mottle artifact, especially the left (series 11, image 166). The left vertebral artery is mildly dominant. Both V2 and V3 segments are patent. No V3 segment stenosis. CTA HEAD Posterior circulation: Dominant distal left vertebral artery, the right terminates in PICA. No distal vertebral artery stenosis. Patent left PICA origin at the skullbase. The left supplies the basilar. Both AICA origins are patent. No basilar artery stenosis. Both SCA origins are patent (series 9 image 239). Fetal type bilateral PCA origins. Bilateral PCA branches are within normal limits. Anterior circulation: Both ICA siphons are patent and mildly dolichoectatic. No siphon stenosis. Normal ophthalmic and posterior communicating artery origins. Normal carotid termini. Normal MCA and ACA origins. Mildly dominant left A1 segment. Anterior communicating artery and bilateral ACA branches are within normal limits. Left MCA M1 segment, bifurcation, and left MCA branches are within normal limits. Right MCA M1 segment, bifurcation, and right MCA branches are within normal limits. Venous sinuses: Patent. Anatomic variants: Mildly dominant left vertebral  artery. Fetal type bilateral PCA origins. Mildly dominant left A1 segment. Delayed phase: Hypodensity re - identified in the left SCA territory and stable. No intracranial hemorrhage or mass effect. No abnormal enhancement identified. IMPRESSION: 1. No emergent large vessel occlusion. Negative posterior circulation. Left SCA origin is patent. Poorly visualized vertebral artery origins and proximal segments due to large body habitus. No vertebral artery stenosis is evident. 2. Negative anterior circulation. Retropharyngeal cervical carotid arteries with ICA siphon dolichoectasia, and fetal type bilateral PCA origins. 3. Stable CT appearance of the brain since 0508 hours today with wedge-shaped hypodensity in the left SCA territory suspicious for acute or subacute infarct. Electronically Signed   By: Odessa Fleming M.D.   On: 08/12/2015 07:18    EKG:     IMPRESSION AND PLAN:    45 year old male with a history of CVA who presents with left-sided weakness and facial droop and slurred speech with left cerebellar infarct.    1. Acute left cerebellar infarct: Patient is already in aspirin so I switched and Plavix. Continue statin and check lipid panel. Continue neuro checks every 4 hours. Neurology consultation place and I have discussed with the neurologist. MRI and MRA of the head has been ordered. Carotid ultrasound will also be ordered in addition to echocardiogram. PT and OT consultation have been placed as well.  2. Chronic systolic heart failure ejection fraction of less than 20%: Continue Lasix and Coreg and enalapril. Monitor for signs or symptoms of congestive heart failure.    3. Chronic kidney disease stage III: Creatinine remained stable. Continue to monitor creatinine.  4. Hyperlipidemia: Continue statin and check lipid panel.  5. Morbid obesity: Encouraged weight loss and diet.   All the records are  reviewed and case discussed with ED provider. Management plans discussed with the patient and  HE is in agreement.  CODE STATUS:full  TOTAL TIME TAKING CARE OF THIS PATIENT: 50 minutes.    Aniesa Boback M.D on 08/12/2015 at 9:37 AM  Between 7am to 6pm - Pager - [phone] After 6pm go to [email or web address] - password [email or web address] [facility]  [facility]  Office  [phone]  CC: Primary care physician; Amy Diana Eves, NP

## 2015-08-12 NOTE — ED Provider Notes (Signed)
[facility] Emergency Department Provider Note  ____________________________________________  Time seen: Approximately 4:37 AM  I have reviewed the triage vital signs and the nursing notes.   HISTORY  Chief Complaint Cerebrovascular Accident    HPI Kent Stanley. is a 45 y.o. male who comes into the hospital today with some left-sided weakness. The patient reports that he tried to get up tonight to use the bathroom and when he got up he noticed that he was stumbling when he walks. The patient reports that he is having some left-sided weakness and some left-sided headache. The patient also reports he had no feeling on his left side. The patient couldn't get his arm to do anything. He took some aspirin 81 mg and had his roommate called EMS. He woke up with the symptoms around 345. The patient reports that this is never occurred in the past but he has had a TIA previously. The patient reports that he has some mild blurred vision and continued headache. The patient also endorses shortness of breath and chest pain on and off for the past 2 weeks. He is also been feeling dizzy and lightheaded. EMS reports that once they arrived the patient did have a left-sided facial droop and weakness that once they got into the truck the symptoms were resolved. The patient is here for further evaluation of his symptoms.   Past Medical History  Diagnosis Date  . Seizure disorder (HCC)   . History of traumatic injury of head   . Stroke (HCC)   . Chronic kidney disease   . Irregular heart beat   . CHF (congestive heart failure) (HCC)   . Chronic systolic heart failure (HCC) April of 2014    Ejection fraction less than 20%  . Hyperlipidemia   . Hypertension   . Obesity     Patient Active Problem List   Diagnosis Date Noted  . Epididymitis 12/25/2014  . Musculoskeletal pain 10/31/2014  . Erectile dysfunction 10/31/2014  . Sleep apnea 11/05/2013  . Chronic systolic heart  failure (HCC)   . Hyperlipidemia   . Hypertension   . Diabetes (HCC) 01/27/2013  . HLD (hyperlipidemia) 01/27/2013  . Adiposity 01/27/2013  . Cerebral seizure (HCC) 01/27/2013  . Cerebrovascular accident, old 01/27/2013    Past Surgical History  Procedure Laterality Date  . Cardiac catheterization      [facility]    Current Outpatient Rx  Name  Route  Sig  Dispense  Refill  . aspirin EC 81 MG tablet   Oral   Take 1 tablet (81 mg total) by mouth daily.   90 tablet   3   . carvedilol (COREG) 6.25 MG tablet      TAKE ONE TABLET BY MOUTH TWICE DAILY WITH A MEAL   60 tablet   3   . doxycycline (VIBRA-TABS) 100 MG tablet   Oral   Take 1 tablet (100 mg total) by mouth 2 (two) times daily.   28 tablet   0   . enalapril (VASOTEC) 5 MG tablet   Oral   Take 1 tablet (5 mg total) by mouth daily.   30 tablet   6   . furosemide (LASIX) 40 MG tablet      TAKE ONE TABLET BY MOUTH TWICE DAILY   60 tablet   3     Dispense as written.   . lovastatin (MEVACOR) 20 MG tablet   Oral   Take 1 tablet (20 mg total) by mouth at bedtime.  30 tablet   6   . sildenafil (VIAGRA) 25 MG tablet   Oral   Take 1 tablet (25 mg total) by mouth daily as needed for erectile dysfunction.   6 tablet   2     Allergies Review of patient's allergies indicates no known allergies.  Family History  Problem Relation Age of Onset  . Diabetes Mother   . Brain cancer Mother   . Heart attack Mother   . Hypertension Father   . Diabetes Father   . Heart disease Father   . Hyperlipidemia Father   . Heart attack Father   . Heart failure Father     Social History Social History  Substance Use Topics  . Smoking status: Current Some Day Smoker -- 10 years    Types: Cigarettes  . Smokeless tobacco: None  . Alcohol Use: Yes     Comment: socially    Review of Systems Constitutional: No fever/chills Eyes: Blurred vision ENT: No sore throat. Cardiovascular: chest pain. Respiratory: shortness  of breath. Gastrointestinal: No abdominal pain.  No nausea, no vomiting.  No diarrhea.  No constipation. Genitourinary: Negative for dysuria. Musculoskeletal: Negative for back pain. Skin: Negative for rash. Neurological: Headache with some left sided weakness, left facial droop, numbness and slurred speech  10-point ROS otherwise negative.  ____________________________________________   PHYSICAL EXAM:  VITAL SIGNS: ED Triage Vitals  Enc Vitals Group     BP 08/12/15 0443 147/95 mmHg     Pulse Rate 08/12/15 0443 79     Resp 08/12/15 0443 18     Temp 08/12/15 0443 98 F (36.7 C)     Temp Source 08/12/15 0443 Oral     SpO2 08/12/15 0443 96 %     Weight 08/12/15 0443 335 lb (151.955 kg)     Height 08/12/15 0443 6' (1.829 m)     Head Cir --      Peak Flow --      Pain Score 08/12/15 0446 4     Pain Loc --      Pain Edu? --      Excl. in GC? --     Constitutional: Alert and oriented. Well appearing and in no acute distress. Eyes: Conjunctivae are normal. PERRL. EOMI. Head: Atraumatic. Nose: No congestion/rhinnorhea. Mouth/Throat: Mucous membranes are moist.  Oropharynx non-erythematous. Cardiovascular: Normal rate, regular rhythm. Grossly normal heart sounds.  Good peripheral circulation. Respiratory: Normal respiratory effort.  No retractions. Lungs CTAB. Gastrointestinal: Soft and nontender. No distention. Positive bowel sounds Musculoskeletal: No lower extremity tenderness nor edema.  Neurologic:  Normal speech and language. Cranial nerves II through XII are grossly intact. The patient had some mild left-sided facial droop but it did improve as he smiled bigger. The patient had equal strength in bilateral upper extremities and good facial sensation throughout. The patient did not have any pronator drift nor did he have any significant ataxia. He did seem to be slower when performing finger-to-nose on the left side. The patient has no lower extremity weakness as well.  Skin:   Skin is warm, dry and intact. No rash noted. Psychiatric: Mood and affect are normal.   ____________________________________________   LABS (all labs ordered are listed, but only abnormal results are displayed)  Labs Reviewed  CBC - Abnormal; Notable for the following:    RDW 14.6 (*)    All other components within normal limits  COMPREHENSIVE METABOLIC PANEL - Abnormal; Notable for the following:    CO2 33 (*)  Glucose, Bld 176 (*)    BUN 25 (*)    Creatinine, Ser 1.89 (*)    GFR calc non Af Amer 41 (*)    GFR calc Af Amer 48 (*)    Anion gap 4 (*)    All other components within normal limits  TROPONIN I - Abnormal; Notable for the following:    Troponin I 0.08 (*)    All other components within normal limits  PROTIME-INR  APTT   ____________________________________________  EKG  ED ECG REPORT I, Rebecka Apley, the attending physician, personally viewed and interpreted this ECG.   Date: 08/12/2015  EKG Time: 456  Rate: 82  Rhythm: normal sinus rhythm  Axis: right axis deviation  Intervals:none  ST&T Change: flipped t waves in leads II, III, avf, V5, V6  ____________________________________________  RADIOLOGY  CT head: Recent appearing left superior cerebellar artery infarct, evaluation of left cerebral cortex is limited due to artifact. No acute hemorrhage.  CT angio head and neck: No emergent large vessel occlusion, negative posterior circulation, left ICA origin is patent, poorly visualized vertebral artery origins and proximal segments due to large body habitus. No vertebral artery stenosis is evident. Stable CT appearance of the brain since 508 today with wedge-shaped hypodensity in the left ICA territory suspicious for acute or subacute infarct. ____________________________________________   PROCEDURES  Procedure(s) performed: None  Critical Care performed: No  ____________________________________________   INITIAL IMPRESSION / ASSESSMENT AND  PLAN / ED COURSE  Pertinent labs & imaging results that were available during my care of the patient were reviewed by me and considered in my medical decision making (see chart for details).  This is a 45 year old male who has a significant cardiac history as well as a history of TIA who comes into the hospital today with some left-sided weakness, facial droop and slurred speech. The patient's symptoms resolved so there is a concern that he may have some TIA. I will send the patient for a CT scan and perform blood work to evaluate the patient. At this time his NIH stroke scale is 0. I will reassess the patient once I received his blood work and his imaging.  The patient was seen by the specialist on call who recommends admitting the patient for full stroke workup. They report that they will give his NIH at this time at 2 but he is not a candidate for TPA due to the rapid improvement of his symptoms and the last time seen normal. The patient will be admitted to the hospitalist service for further evaluation. He did receive a dose of aspirin while in the emergency department. ____________________________________________   FINAL CLINICAL IMPRESSION(S) / ED DIAGNOSES  Final diagnoses:  CVA (cerebral infarction)      Rebecka Apley, MD 08/12/15 [phone]

## 2015-08-12 NOTE — Progress Notes (Signed)
Anticoagulation monitoring(Lovenox):  44yo  ordered Lovenox 40 mg Q24h  Filed Weights   08/12/15 0443  Weight: 335 lb (151.955 kg)   Body mass index is 45.42 kg/(m^2).  Lab Results  Component Value Date   CREATININE 1.89* 08/12/2015   CREATININE 1.69* 10/03/2014   CREATININE 1.99* 06/21/2014   Estimated Creatinine Clearance: 74.2 mL/min (by C-G formula based on Cr of 1.89). Hemoglobin & Hematocrit     Component Value Date/Time   HGB 14.2 08/12/2015 0455   HGB 14.3 06/21/2014 1855   HCT 43.5 08/12/2015 0455   HCT 43.5 06/21/2014 1855     Per Protocol for Patient with estCrcl> 30 ml/min and BMI > 40, will transition to Lovenox 40 mg Q12h.

## 2015-08-12 NOTE — ED Notes (Signed)
Pt shirt and medications given to patient in room on floor, 112. RN met in room to assist with movement in bed.

## 2015-08-12 NOTE — ED Notes (Signed)
Dr Zenda Alpers notified of TROPONIN 0.08, orders received

## 2015-08-12 NOTE — ED Notes (Signed)
Pt returned to room from CT

## 2015-08-13 ENCOUNTER — Telehealth: Payer: Self-pay

## 2015-08-13 ENCOUNTER — Inpatient Hospital Stay: Payer: BLUE CROSS/BLUE SHIELD

## 2015-08-13 LAB — BASIC METABOLIC PANEL
Anion gap: 12 (ref 5–15)
BUN: 19 mg/dL (ref 6–20)
CALCIUM: 9 mg/dL (ref 8.9–10.3)
CHLORIDE: 102 mmol/L (ref 101–111)
CO2: 25 mmol/L (ref 22–32)
CREATININE: 1.52 mg/dL — AB (ref 0.61–1.24)
GFR calc non Af Amer: 53 mL/min — ABNORMAL LOW (ref >60)
Glucose, Bld: 153 mg/dL — ABNORMAL HIGH (ref 65–99)
Potassium: 3.7 mmol/L (ref 3.5–5.1)
SODIUM: 139 mmol/L (ref 135–145)

## 2015-08-13 LAB — CBC
HCT: 44 % (ref 40.0–52.0)
Hemoglobin: 14.4 g/dL (ref 13.0–18.0)
MCH: 31.7 pg (ref 26.0–34.0)
MCHC: 32.6 g/dL (ref 32.0–36.0)
MCV: 97.1 fL (ref 80.0–100.0)
PLATELETS: 166 10*3/uL (ref 150–440)
RBC: 4.53 MIL/uL (ref 4.40–5.90)
RDW: 14.8 % — AB (ref 11.5–14.5)
WBC: 9.3 10*3/uL (ref 3.8–10.6)

## 2015-08-13 LAB — GLUCOSE, CAPILLARY
GLUCOSE-CAPILLARY: 291 mg/dL — AB (ref 65–99)
Glucose-Capillary: 162 mg/dL — ABNORMAL HIGH (ref 65–99)

## 2015-08-13 MED ORDER — PRAVASTATIN SODIUM 80 MG PO TABS: 80.0000 mg | ORAL_TABLET | Freq: Every day | ORAL | Status: DC

## 2015-08-13 MED ORDER — INSULIN ASPART 100 UNIT/ML ~~LOC~~ SOLN: 0.0000 [IU] | Freq: Every day | SUBCUTANEOUS | Status: DC

## 2015-08-13 MED ORDER — PRAVASTATIN SODIUM 40 MG PO TABS
80.0000 mg | ORAL_TABLET | Freq: Every day | ORAL | Status: DC
  Administered 2015-08-13: 18:00:00 80 mg via ORAL
  Filled 2015-08-13: qty 2

## 2015-08-13 MED ORDER — CLOPIDOGREL BISULFATE 75 MG PO TABS: 75.0000 mg | ORAL_TABLET | Freq: Every day | ORAL | Status: DC

## 2015-08-13 MED ORDER — LIVING WELL WITH DIABETES BOOK
Freq: Once | Status: AC
  Administered 2015-08-13: 13:00:00
  Filled 2015-08-13: qty 1

## 2015-08-13 MED ORDER — INSULIN ASPART 100 UNIT/ML ~~LOC~~ SOLN
0.0000 [IU] | Freq: Three times a day (TID) | SUBCUTANEOUS | Status: DC
  Administered 2015-08-13: 2 [IU] via SUBCUTANEOUS
  Administered 2015-08-13: 14:00:00 5 [IU] via SUBCUTANEOUS
  Filled 2015-08-13: qty 5
  Filled 2015-08-13: qty 2

## 2015-08-13 MED ORDER — GLIPIZIDE 5 MG PO TABS: 2.5000 mg | ORAL_TABLET | Freq: Every day | ORAL | Status: DC

## 2015-08-13 NOTE — Progress Notes (Addendum)
[facility] - [place] at [facility]   PATIENT NAME: Kent Stanley    MR#:  [id number]  DATE OF BIRTH:  1970/09/09  SUBJECTIVE:   Speech is better Still with LUE weakness  REVIEW OF SYSTEMS:    Review of Systems  Constitutional: Negative for fever, chills and malaise/fatigue.  HENT: Negative for sore throat.   Eyes: Negative for blurred vision.  Respiratory: Negative for cough, hemoptysis, shortness of breath and wheezing.   Cardiovascular: Negative for chest pain, palpitations and leg swelling.  Gastrointestinal: Negative for nausea, vomiting, abdominal pain, diarrhea and blood in stool.  Genitourinary: Negative for dysuria.  Musculoskeletal: Negative for back pain.  Neurological: Positive for focal weakness. Negative for dizziness, tremors and headaches.  Endo/Heme/Allergies: Does not bruise/bleed easily.    Tolerating Diet:yes      DRUG ALLERGIES:   Allergies  Allergen Reactions  . Tomato Rash    VITALS:  Blood pressure 125/86, pulse 46, temperature 98.2 F (36.8 C), temperature source Oral, resp. rate 16, height 6' (1.829 m), weight 151.955 kg (335 lb), SpO2 99 %.  PHYSICAL EXAMINATION:   Physical Exam  Constitutional: He is oriented to person, place, and time and well-developed, well-nourished, and in no distress. No distress.  HENT:  Head: Normocephalic.  Eyes: No scleral icterus.  Neck: Normal range of motion. Neck supple. No JVD present. No tracheal deviation present.  Cardiovascular: Normal rate, regular rhythm and normal heart sounds.  Exam reveals no gallop and no friction rub.   No murmur heard. Pulmonary/Chest: Effort normal and breath sounds normal. No respiratory distress. He has no wheezes. He has no rales. He exhibits no tenderness.  Abdominal: Soft. Bowel sounds are normal. He exhibits no distension and no mass. There is no tenderness. There is no rebound and no guarding.  Musculoskeletal: Normal range of motion. He  exhibits no edema.  Neurological: He is alert and oriented to person, place, and time. Coordination abnormal.  Left arm 4/5 he was unable to do finger to nose left hand accurately.  Skin: Skin is warm. No rash noted. No erythema.  Psychiatric: Affect and judgment normal.      LABORATORY PANEL:   CBC  Recent Labs Lab 08/13/15 0503  WBC 9.3  HGB 14.4  HCT 44.0  PLT 166   ------------------------------------------------------------------------------------------------------------------  Chemistries   Recent Labs Lab 08/12/15 0455 08/13/15 0503  NA 138 139  K 3.9 3.7  CL 101 102  CO2 33* 25  GLUCOSE 176* 153*  BUN 25* 19  CREATININE 1.89* 1.52*  CALCIUM 8.9 9.0  AST 24  --   ALT 22  --   ALKPHOS 44  --   BILITOT 0.6  --    ------------------------------------------------------------------------------------------------------------------  Cardiac Enzymes  Recent Labs Lab 08/12/15 0455  TROPONINI 0.08*   ------------------------------------------------------------------------------------------------------------------  RADIOLOGY:  Ct Angio Head W/cm &/or Wo Cm  08/12/2015  CLINICAL DATA:  45 year old male with left side numbness and slurred speech since this morning. Suspicion of acute left superior cerebellar artery infarct on noncontrast head CT. Initial encounter. EXAM: CT ANGIOGRAPHY HEAD AND NECK TECHNIQUE: Multidetector CT imaging of the head and neck was performed using the standard protocol during bolus administration of intravenous contrast. Multiplanar CT image reconstructions and MIPs were obtained to evaluate the vascular anatomy. Carotid stenosis measurements (when applicable) are obtained utilizing NASCET criteria, using the distal internal carotid diameter as the denominator. CONTRAST:  80mL OMNIPAQUE IOHEXOL 350 MG/ML SOLN COMPARISON:  noncontrast head  CT 0508 hours today.  FINDINGS: CTA NECK Skeleton: Large body habitus, quantum mottle artifact in the  lower neck and chest. No acute osseous abnormality identified. Small left maxillary sinus mucous retention cyst. Other neck: Negative lung apices. No superior mediastinal lymphadenopathy. Thyroid detail obscured by a large body habitus. Larynx, pharynx, parapharyngeal spaces and retropharyngeal space (retropharyngeal course of both carotids) within normal limits. Sublingual space, submandibular glands, parotid glands, and orbit soft tissues within normal limits. No cervical lymphadenopathy. Aortic arch: 3 vessel arch configuration. No arch atherosclerosis is evident. No great vessel origin stenosis identified. Right carotid system: Negative right CCA, retropharyngeal course. Retropharyngeal right carotid bifurcation is normal. Negative cervical right ICA aside from tortuosity just below the skullbase. Left carotid system: Negative left CCA. Retropharyngeal left carotid bifurcation is normal. Tortuous cervical left ICA just. Below the skullbase Vertebral arteries:No proximal subclavian artery stenosis. No definite vertebral artery origin stenosis, vertebral artery origins are not well visualized due to quantum mottle artifact, especially the left (series 11, image 166). The left vertebral artery is mildly dominant. Both V2 and V3 segments are patent. No V3 segment stenosis. CTA HEAD Posterior circulation: Dominant distal left vertebral artery, the right terminates in PICA. No distal vertebral artery stenosis. Patent left PICA origin at the skullbase. The left supplies the basilar. Both AICA origins are patent. No basilar artery stenosis. Both SCA origins are patent (series 9 image 239). Fetal type bilateral PCA origins. Bilateral PCA branches are within normal limits. Anterior circulation: Both ICA siphons are patent and mildly dolichoectatic. No siphon stenosis. Normal ophthalmic and posterior communicating artery origins. Normal carotid termini. Normal MCA and ACA origins. Mildly dominant left A1 segment. Anterior  communicating artery and bilateral ACA branches are within normal limits. Left MCA M1 segment, bifurcation, and left MCA branches are within normal limits. Right MCA M1 segment, bifurcation, and right MCA branches are within normal limits. Venous sinuses: Patent. Anatomic variants: Mildly dominant left vertebral artery. Fetal type bilateral PCA origins. Mildly dominant left A1 segment. Delayed phase: Hypodensity re - identified in the left SCA territory and stable. No intracranial hemorrhage or mass effect. No abnormal enhancement identified. IMPRESSION: 1. No emergent large vessel occlusion. Negative posterior circulation. Left SCA origin is patent. Poorly visualized vertebral artery origins and proximal segments due to large body habitus. No vertebral artery stenosis is evident. 2. Negative anterior circulation. Retropharyngeal cervical carotid arteries with ICA siphon dolichoectasia, and fetal type bilateral PCA origins. 3. Stable CT appearance of the brain since 0508 hours today with wedge-shaped hypodensity in the left SCA territory suspicious for acute or subacute infarct. Electronically Signed   By: Odessa Fleming M.D.   On: 08/12/2015 07:18   Ct Head Wo Contrast  08/12/2015  CLINICAL DATA:  Left-sided weakness and slurred speech since this morning. EXAM: CT HEAD WITHOUT CONTRAST TECHNIQUE: Contiguous axial images were obtained from the base of the skull through the vertex without intravenous contrast. COMPARISON:  06/21/2014 FINDINGS: Skull and Sinuses:Negative for fracture or destructive process. The visualized mastoids, middle ears, and imaged paranasal sinuses are clear. Visualized orbits: Chronic proptosis without mass or extraocular muscle thickening. Brain: Hypodense appearance in the upper left cerebellum without volume loss. Evaluation of the cerebral cortex is limited by artifact. There are areas of anterior and high left frontal cortex which are indistinct, concerning for acute infarct, but  indeterminate due to scan limitations. No definitive large vessel left MCA territory infarct to correlate with symptoms. No hyper dense vessel noted. No hemorrhage or hydrocephalus. Asymmetric lateral ventricles, stable and  developmental. No midline shift. These results were called by telephone at the time of interpretation on 08/12/2015 at 5:20 am to Dr. Lucrezia Europe , who verbally acknowledged these results. IMPRESSION: 1. Recent appearing left superior cerebellar artery infarct. 2. Evaluation of left cerebral cortex is limited due to artifact, as above. 3. No acute hemorrhage. Electronically Signed   By: Marnee Spring M.D.   On: 08/12/2015 05:27   Ct Angio Neck W/cm &/or Wo/cm  08/12/2015  CLINICAL DATA:  45 year old male with left side numbness and slurred speech since this morning. Suspicion of acute left superior cerebellar artery infarct on noncontrast head CT. Initial encounter. EXAM: CT ANGIOGRAPHY HEAD AND NECK TECHNIQUE: Multidetector CT imaging of the head and neck was performed using the standard protocol during bolus administration of intravenous contrast. Multiplanar CT image reconstructions and MIPs were obtained to evaluate the vascular anatomy. Carotid stenosis measurements (when applicable) are obtained utilizing NASCET criteria, using the distal internal carotid diameter as the denominator. CONTRAST:  80mL OMNIPAQUE IOHEXOL 350 MG/ML SOLN COMPARISON:  noncontrast head  CT 0508 hours today. FINDINGS: CTA NECK Skeleton: Large body habitus, quantum mottle artifact in the lower neck and chest. No acute osseous abnormality identified. Small left maxillary sinus mucous retention cyst. Other neck: Negative lung apices. No superior mediastinal lymphadenopathy. Thyroid detail obscured by a large body habitus. Larynx, pharynx, parapharyngeal spaces and retropharyngeal space (retropharyngeal course of both carotids) within normal limits. Sublingual space, submandibular glands, parotid glands, and orbit  soft tissues within normal limits. No cervical lymphadenopathy. Aortic arch: 3 vessel arch configuration. No arch atherosclerosis is evident. No great vessel origin stenosis identified. Right carotid system: Negative right CCA, retropharyngeal course. Retropharyngeal right carotid bifurcation is normal. Negative cervical right ICA aside from tortuosity just below the skullbase. Left carotid system: Negative left CCA. Retropharyngeal left carotid bifurcation is normal. Tortuous cervical left ICA just. Below the skullbase Vertebral arteries:No proximal subclavian artery stenosis. No definite vertebral artery origin stenosis, vertebral artery origins are not well visualized due to quantum mottle artifact, especially the left (series 11, image 166). The left vertebral artery is mildly dominant. Both V2 and V3 segments are patent. No V3 segment stenosis. CTA HEAD Posterior circulation: Dominant distal left vertebral artery, the right terminates in PICA. No distal vertebral artery stenosis. Patent left PICA origin at the skullbase. The left supplies the basilar. Both AICA origins are patent. No basilar artery stenosis. Both SCA origins are patent (series 9 image 239). Fetal type bilateral PCA origins. Bilateral PCA branches are within normal limits. Anterior circulation: Both ICA siphons are patent and mildly dolichoectatic. No siphon stenosis. Normal ophthalmic and posterior communicating artery origins. Normal carotid termini. Normal MCA and ACA origins. Mildly dominant left A1 segment. Anterior communicating artery and bilateral ACA branches are within normal limits. Left MCA M1 segment, bifurcation, and left MCA branches are within normal limits. Right MCA M1 segment, bifurcation, and right MCA branches are within normal limits. Venous sinuses: Patent. Anatomic variants: Mildly dominant left vertebral artery. Fetal type bilateral PCA origins. Mildly dominant left A1 segment. Delayed phase: Hypodensity re - identified  in the left SCA territory and stable. No intracranial hemorrhage or mass effect. No abnormal enhancement identified. IMPRESSION: 1. No emergent large vessel occlusion. Negative posterior circulation. Left SCA origin is patent. Poorly visualized vertebral artery origins and proximal segments due to large body habitus. No vertebral artery stenosis is evident. 2. Negative anterior circulation. Retropharyngeal cervical carotid arteries with ICA siphon dolichoectasia, and fetal type bilateral PCA  origins. 3. Stable CT appearance of the brain since 0508 hours today with wedge-shaped hypodensity in the left SCA territory suspicious for acute or subacute infarct. Electronically Signed   By: Odessa Fleming M.D.   On: 08/12/2015 07:18   US Carotid Bilateral  08/12/2015  CLINICAL DATA:  CVA. EXAM: BILATERAL CAROTID DUPLEX ULTRASOUND TECHNIQUE: Nakul Cullens scale imaging, color Doppler and duplex ultrasound were performed of bilateral carotid and vertebral arteries in the neck. COMPARISON:  None. FINDINGS: Criteria: Quantification of carotid stenosis is based on velocity parameters that correlate the residual internal carotid diameter with NASCET-based stenosis levels, using the diameter of the distal internal carotid lumen as the denominator for stenosis measurement. The following velocity measurements were obtained: RIGHT ICA:  78 cm/sec CCA:  95 cm/sec SYSTOLIC ICA/CCA RATIO:  0.8 DIASTOLIC ICA/CCA RATIO:  0.8 ECA:  98 cm/sec LEFT ICA:  70 cm/sec CCA:  106 cm/sec SYSTOLIC ICA/CCA RATIO:  0.7 DIASTOLIC ICA/CCA RATIO:  0.5 ECA:  106 cm/sec RIGHT CAROTID ARTERY: Right carotid arteries are patent without significant plaque or stenosis. Normal waveforms and velocities in the right internal carotid artery. RIGHT VERTEBRAL ARTERY: Antegrade flow and normal waveform in the right vertebral artery. LEFT CAROTID ARTERY: Left carotid arteries are patent without significant plaque or stenosis. Mild intimal thickening in the left common carotid  artery. Normal waveforms and velocities in the left internal carotid artery. LEFT VERTEBRAL ARTERY: Antegrade flow and normal waveform in the left vertebral artery. IMPRESSION: Normal carotid artery examination. No significant carotid artery stenosis or plaque. Electronically Signed   By: Richarda Overlie M.D.   On: 08/12/2015 13:36     ASSESSMENT AND PLAN:   45 year old male with a history of CVA who presents with left-sided weakness and facial droop and slurred speech with left cerebellar infarct.   1. Acute left cerebellar infarct:  Continue statin and Plavix. Continue neuro checks every 4 hours.  PT is recommending CIR Carotid ultrasound shows non significant stenosis MRI is pending ECHO pending   2. Chronic systolic heart failure ejection fraction of less than 20%: Continue Lasix and enalapril.  Heart rate is low. Patient without symptoms Stop [facility] consult Needs outpatient sleep study.   3. Chronic kidney disease stage III: Creatinine stable.  4. Hyperlipidemia: Continue statin and check lipid panel. LDL 127.  Increased pravastatin.  5. Morbid obesity: Encouraged weight loss and diet.  6. DM: consult DM coordinator. Start SSI     Management plans discussed with the patient and he is in agreement.  CODE STATUS: FULL  TOTAL TIME TAKING CARE OF THIS PATIENT: 30 minutes.     POSSIBLE D/C 1-2 days, DEPENDING ON CLINICAL CONDITION.   Areya Lemmerman M.D on 08/13/2015 at 9:54 AM  Between 7am to 6pm - Pager - [phone] After 6pm go to [email or web address] - password [email or web address] [facility]  [facility]  Office  [phone]  CC: Primary care physician; Amy L [name], NP  Note: This dictation was prepared with Dragon dictation along with smaller phrase technology. Any transcriptional errors that result from this process are unintentional.

## 2015-08-13 NOTE — Progress Notes (Signed)
Inpatient Diabetes Program Recommendations  AACE/ADA: New Consensus Statement on Inpatient Glycemic Control (2015)  Target Ranges:  Prepandial:   less than 140 mg/dL      Peak postprandial:   less than 180 mg/dL (1-2 hours)      Critically ill patients:  140 - 180 mg/dL   Review of Glycemic ControlResults for LELON, REHRER (MRN [id number]) as of 08/13/2015 12:23  Ref. Range 08/12/2015 04:55 08/13/2015 05:03  Glucose Latest Ref Range: 65-99 mg/dL 427 (H) 062 (H)   Results for SABIEN, MAKAREWICZ (MRN [id number]) as of 08/13/2015 12:23  Ref. Range 08/12/2015 04:55  Hemoglobin A1C Latest Ref Range: 4.0-6.0 % 7.5 (H)   Diabetes history: None noted Outpatient Diabetes medications: None Current orders for Inpatient glycemic control:  Novolog sensitive tid with meals and HS  Inpatient Diabetes Program Recommendations:   Referral received for education due to new diagnosis of diabetes.    Spoke with patient regarding elevated A1C.  He states, "does this mean I'm a diabetic?".  I explained that according to the ADA, an A1C greater than 6.5% is considered diabetes.  Discussed possible lifestyle modifications such as eliminating sugar from his beverages and increased water intake.  He asked about juice and I explained that juices have large amount of sugar and that he should decreased consumption.  Explained a serving size of juice 4 -6oz.  He seems concerned about his job stating "I need to be able to walk".  He briefly discussed the possibility of rehab, but states "I haven't heard yet".    He does have a PCP at [facility] and states he plans to follow up there.  Told patient about "Living Well with Diabetes" booklet which was ordered for him.  He is also agreeable to follow-up at Outpatient diabetes education program for further education regarding diabetes and lifestyle modifications.  May consider starting oral diabetes agent prior to discharge. Also please order Glucometer if indicated  at this time for discharge.  Thanks, Beryl Meager, RN, BC-ADM Inpatient Diabetes Coordinator Pager [phone] (8a-5p)

## 2015-08-13 NOTE — Progress Notes (Signed)
I have reviewed PT and OT therapy assessments from yesterday and today. I also spoke with pt by phone to assess his rehab needs. He reports today he has felt less dizzy and has been ambulating to the bathroom with his RW without assistance. States when he feels any unsteadiness, he slows down and waits a minute. His ex girlfriend lives next door and has a neighbor who is 47 yo that he can arrange 24/7 supervision initially at discharge. He prefers Home Health therapy. At pt's current functional level, it is not likely that [employer] would approve an inpatient acute rehabilitation admission. I will contact RN CM to arrange Home Health. [phone]

## 2015-08-13 NOTE — Progress Notes (Signed)
Notified Dr. Juliene Pina via telephone that pt has not had bm in 3 days, enema per MD. MD notified that heart rate is dropping to 30's, MD acknowledged. Mody via telephone.

## 2015-08-13 NOTE — Discharge Summary (Signed)
[facility] - [place] at [facility]   PATIENT NAME: Kent Stanley    MR#:  [id number]  DATE OF BIRTH:  Sep 22, 1970  DATE OF ADMISSION:  08/12/2015 ADMITTING PHYSICIAN: Adrian Saran, MD  DATE OF DISCHARGE: 08/14/2015    PRIMARY CARE PHYSICIAN: Filbert Berthold, NP    ADMISSION DIAGNOSIS:  CVA (cerebral infarction) [I63.9]  DISCHARGE DIAGNOSIS:  Active Problems:   Stroke (cerebrum) (HCC)   SECONDARY DIAGNOSIS:   Past Medical History  Diagnosis Date  . Seizure disorder (HCC)   . History of traumatic injury of head   . Stroke (HCC)   . Chronic kidney disease   . Irregular heart beat   . CHF (congestive heart failure) (HCC)   . Chronic systolic heart failure (HCC) April of 2014    Ejection fraction less than 20%  . Hyperlipidemia   . Hypertension   . Obesity     HOSPITAL COURSE:    45 year old male with a history of CVA who presents with left-sided weakness and facial droop and slurred speech with left cerebellar infarct.   1. Acute left cerebellar infarct: Continue statin and Plavix. He was on Aspirin before admission..  PT is recommending home with HOME HEALTH CARE. Carotid ultrasound shows non significant stenosis MRI shows acute/subacute left superior cerebellar infarct. ECHO pending   2. Chronic systolic heart failure ejection fraction of less than 20%: Continue Lasix and enalapril.  Heart rate is low so COREG was stopped for now, but perhaps he can be restarted on a lower dose as an outpatient.. Patient is without symptoms of bradycardia or CHF CARDIOLOGY was consulted and he can follow up with Dr Kirke Corin as an outpatient. He needs an outpatientsleep study.   3. Chronic kidney disease stage III: Creatinine stable.  4. Hyperlipidemia: Continue statin LDL 127.  He will continue oh high dose pravastatin.  5. Morbid obesity: Encouraged weight loss and diet.  6. DM: HgbA1c >7. He has new onset Diabetes and was referred to outpatient  DIabetes center by our diabetes coordinator. I have started on low dose GLIPIZIDE.     DISCHARGE CONDITIONS AND DIET:  Stable DIABETIC DIET CONSULTS OBTAINED:  Treatment Team:  Kym Groom, MD Thana Farr, MD Iran Ouch, MD Muammar Argentina Donovan, MD  DRUG ALLERGIES:   Allergies  Allergen Reactions  . Tomato Rash    DISCHARGE MEDICATIONS:   Current Discharge Medication List    START taking these medications   Details  clopidogrel (PLAVIX) 75 MG tablet Take 1 tablet (75 mg total) by mouth daily. Qty: 30 tablet, Refills: 0    glipiZIDE (GLUCOTROL) 5 MG tablet Take 0.5 tablets (2.5 mg total) by mouth daily before breakfast. Qty: 30 tablet, Refills: 0    pravastatin (PRAVACHOL) 80 MG tablet Take 1 tablet (80 mg total) by mouth daily at 6 PM. Qty: 30 tablet, Refills: 0      CONTINUE these medications which have NOT CHANGED   Details  enalapril (VASOTEC) 5 MG tablet Take 1 tablet (5 mg total) by mouth daily. Qty: 30 tablet, Refills: 6    furosemide (LASIX) 40 MG tablet TAKE ONE TABLET BY MOUTH TWICE DAILY Qty: 60 tablet, Refills: 3    sildenafil (VIAGRA) 25 MG tablet Take 1 tablet (25 mg total) by mouth daily as needed for erectile dysfunction. Qty: 6 tablet, Refills: 2      STOP taking these medications     aspirin EC 81 MG tablet      carvedilol (COREG)  6.25 MG tablet      lovastatin (MEVACOR) 20 MG tablet               Today   CHIEF COMPLAINT:  Patient doing better this am. Slurred speech resolved. Still with some left arm weakness.   VITAL SIGNS:  Blood pressure 128/81, pulse 91, temperature 98.4 F (36.9 C), temperature source Oral, resp. rate 16, height 6' (1.829 m), weight 151.955 kg (335 lb), SpO2 97 %.   REVIEW OF SYSTEMS:  Review of Systems  Constitutional: Negative for fever, chills and malaise/fatigue.  HENT: Negative for sore throat.   Eyes: Negative for blurred vision.  Respiratory: Negative for cough, hemoptysis,  shortness of breath and wheezing.   Cardiovascular: Negative for chest pain, palpitations and leg swelling.  Gastrointestinal: Negative for nausea, vomiting, abdominal pain, diarrhea and blood in stool.  Genitourinary: Negative for dysuria.  Musculoskeletal: Negative for back pain.  Neurological: Positive for focal weakness. Negative for dizziness, tremors and headaches.  Endo/Heme/Allergies: Does not bruise/bleed easily.     PHYSICAL EXAMINATION:  GENERAL:  45 y.o.-year-old patient lying in the bed with no acute distress.  NECK:  Supple, no jugular venous distention. No thyroid enlargement, no tenderness.  LUNGS: Normal breath sounds bilaterally, no wheezing, rales,rhonchi  No use of accessory muscles of respiration.  CARDIOVASCULAR: S1, S2 normal. No murmurs, rubs, or gallops.  ABDOMEN: Soft, non-tender, non-distended. Bowel sounds present. No organomegaly or mass.  EXTREMITIES: No pedal edema, cyanosis, or clubbing.  PSYCHIATRIC: The patient is alert and oriented x 3.  SKIN: No obvious rash, lesion, or ulcer.  NEURO: CN 2-12 intact with 4/5 LUE strength sensation preserved DATA REVIEW:   CBC  Recent Labs Lab 08/13/15 0503  WBC 9.3  HGB 14.4  HCT 44.0  PLT 166    Chemistries   Recent Labs Lab 08/12/15 0455 08/13/15 0503  NA 138 139  K 3.9 3.7  CL 101 102  CO2 33* 25  GLUCOSE 176* 153*  BUN 25* 19  CREATININE 1.89* 1.52*  CALCIUM 8.9 9.0  AST 24  --   ALT 22  --   ALKPHOS 44  --   BILITOT 0.6  --     Cardiac Enzymes  Recent Labs Lab 08/12/15 0455  TROPONINI 0.08*    Microbiology Results  @[email or web address] @  RADIOLOGY:  Ct Angio Head W/cm &/or Wo Cm  08/12/2015  CLINICAL DATA:  45 year old male with left side numbness and slurred speech since this morning. Suspicion of acute left superior cerebellar artery infarct on noncontrast head CT. Initial encounter. EXAM: CT ANGIOGRAPHY HEAD AND NECK TECHNIQUE: Multidetector CT imaging of the head and neck was  performed using the standard protocol during bolus administration of intravenous contrast. Multiplanar CT image reconstructions and MIPs were obtained to evaluate the vascular anatomy. Carotid stenosis measurements (when applicable) are obtained utilizing NASCET criteria, using the distal internal carotid diameter as the denominator. CONTRAST:  73mL OMNIPAQUE IOHEXOL 350 MG/ML SOLN COMPARISON:  noncontrast head  CT 0508 hours today. FINDINGS: CTA NECK Skeleton: Large body habitus, quantum mottle artifact in the lower neck and chest. No acute osseous abnormality identified. Small left maxillary sinus mucous retention cyst. Other neck: Negative lung apices. No superior mediastinal lymphadenopathy. Thyroid detail obscured by a large body habitus. Larynx, pharynx, parapharyngeal spaces and retropharyngeal space (retropharyngeal course of both carotids) within normal limits. Sublingual space, submandibular glands, parotid glands, and orbit soft tissues within normal limits. No cervical lymphadenopathy. Aortic arch: 3 vessel arch configuration. No arch  atherosclerosis is evident. No great vessel origin stenosis identified. Right carotid system: Negative right CCA, retropharyngeal course. Retropharyngeal right carotid bifurcation is normal. Negative cervical right ICA aside from tortuosity just below the skullbase. Left carotid system: Negative left CCA. Retropharyngeal left carotid bifurcation is normal. Tortuous cervical left ICA just. Below the skullbase Vertebral arteries:No proximal subclavian artery stenosis. No definite vertebral artery origin stenosis, vertebral artery origins are not well visualized due to quantum mottle artifact, especially the left (series 11, image 166). The left vertebral artery is mildly dominant. Both V2 and V3 segments are patent. No V3 segment stenosis. CTA HEAD Posterior circulation: Dominant distal left vertebral artery, the right terminates in PICA. No distal vertebral artery stenosis.  Patent left PICA origin at the skullbase. The left supplies the basilar. Both AICA origins are patent. No basilar artery stenosis. Both SCA origins are patent (series 9 image 239). Fetal type bilateral PCA origins. Bilateral PCA branches are within normal limits. Anterior circulation: Both ICA siphons are patent and mildly dolichoectatic. No siphon stenosis. Normal ophthalmic and posterior communicating artery origins. Normal carotid termini. Normal MCA and ACA origins. Mildly dominant left A1 segment. Anterior communicating artery and bilateral ACA branches are within normal limits. Left MCA M1 segment, bifurcation, and left MCA branches are within normal limits. Right MCA M1 segment, bifurcation, and right MCA branches are within normal limits. Venous sinuses: Patent. Anatomic variants: Mildly dominant left vertebral artery. Fetal type bilateral PCA origins. Mildly dominant left A1 segment. Delayed phase: Hypodensity re - identified in the left SCA territory and stable. No intracranial hemorrhage or mass effect. No abnormal enhancement identified. IMPRESSION: 1. No emergent large vessel occlusion. Negative posterior circulation. Left SCA origin is patent. Poorly visualized vertebral artery origins and proximal segments due to large body habitus. No vertebral artery stenosis is evident. 2. Negative anterior circulation. Retropharyngeal cervical carotid arteries with ICA siphon dolichoectasia, and fetal type bilateral PCA origins. 3. Stable CT appearance of the brain since 0508 hours today with wedge-shaped hypodensity in the left SCA territory suspicious for acute or subacute infarct. Electronically Signed   By: Odessa Fleming M.D.   On: 08/12/2015 07:18   Ct Head Wo Contrast  08/12/2015  CLINICAL DATA:  Left-sided weakness and slurred speech since this morning. EXAM: CT HEAD WITHOUT CONTRAST TECHNIQUE: Contiguous axial images were obtained from the base of the skull through the vertex without intravenous contrast.  COMPARISON:  06/21/2014 FINDINGS: Skull and Sinuses:Negative for fracture or destructive process. The visualized mastoids, middle ears, and imaged paranasal sinuses are clear. Visualized orbits: Chronic proptosis without mass or extraocular muscle thickening. Brain: Hypodense appearance in the upper left cerebellum without volume loss. Evaluation of the cerebral cortex is limited by artifact. There are areas of anterior and high left frontal cortex which are indistinct, concerning for acute infarct, but indeterminate due to scan limitations. No definitive large vessel left MCA territory infarct to correlate with symptoms. No hyper dense vessel noted. No hemorrhage or hydrocephalus. Asymmetric lateral ventricles, stable and developmental. No midline shift. These results were called by telephone at the time of interpretation on 08/12/2015 at 5:20 am to Dr. Lucrezia Europe , who verbally acknowledged these results. IMPRESSION: 1. Recent appearing left superior cerebellar artery infarct. 2. Evaluation of left cerebral cortex is limited due to artifact, as above. 3. No acute hemorrhage. Electronically Signed   By: Marnee Spring M.D.   On: 08/12/2015 05:27   Ct Angio Neck W/cm &/or Wo/cm  08/12/2015  CLINICAL DATA:  45 year old  male with left side numbness and slurred speech since this morning. Suspicion of acute left superior cerebellar artery infarct on noncontrast head CT. Initial encounter. EXAM: CT ANGIOGRAPHY HEAD AND NECK TECHNIQUE: Multidetector CT imaging of the head and neck was performed using the standard protocol during bolus administration of intravenous contrast. Multiplanar CT image reconstructions and MIPs were obtained to evaluate the vascular anatomy. Carotid stenosis measurements (when applicable) are obtained utilizing NASCET criteria, using the distal internal carotid diameter as the denominator. CONTRAST:  80mL OMNIPAQUE IOHEXOL 350 MG/ML SOLN COMPARISON:  noncontrast head  CT 0508 hours today.  FINDINGS: CTA NECK Skeleton: Large body habitus, quantum mottle artifact in the lower neck and chest. No acute osseous abnormality identified. Small left maxillary sinus mucous retention cyst. Other neck: Negative lung apices. No superior mediastinal lymphadenopathy. Thyroid detail obscured by a large body habitus. Larynx, pharynx, parapharyngeal spaces and retropharyngeal space (retropharyngeal course of both carotids) within normal limits. Sublingual space, submandibular glands, parotid glands, and orbit soft tissues within normal limits. No cervical lymphadenopathy. Aortic arch: 3 vessel arch configuration. No arch atherosclerosis is evident. No great vessel origin stenosis identified. Right carotid system: Negative right CCA, retropharyngeal course. Retropharyngeal right carotid bifurcation is normal. Negative cervical right ICA aside from tortuosity just below the skullbase. Left carotid system: Negative left CCA. Retropharyngeal left carotid bifurcation is normal. Tortuous cervical left ICA just. Below the skullbase Vertebral arteries:No proximal subclavian artery stenosis. No definite vertebral artery origin stenosis, vertebral artery origins are not well visualized due to quantum mottle artifact, especially the left (series 11, image 166). The left vertebral artery is mildly dominant. Both V2 and V3 segments are patent. No V3 segment stenosis. CTA HEAD Posterior circulation: Dominant distal left vertebral artery, the right terminates in PICA. No distal vertebral artery stenosis. Patent left PICA origin at the skullbase. The left supplies the basilar. Both AICA origins are patent. No basilar artery stenosis. Both SCA origins are patent (series 9 image 239). Fetal type bilateral PCA origins. Bilateral PCA branches are within normal limits. Anterior circulation: Both ICA siphons are patent and mildly dolichoectatic. No siphon stenosis. Normal ophthalmic and posterior communicating artery origins. Normal carotid  termini. Normal MCA and ACA origins. Mildly dominant left A1 segment. Anterior communicating artery and bilateral ACA branches are within normal limits. Left MCA M1 segment, bifurcation, and left MCA branches are within normal limits. Right MCA M1 segment, bifurcation, and right MCA branches are within normal limits. Venous sinuses: Patent. Anatomic variants: Mildly dominant left vertebral artery. Fetal type bilateral PCA origins. Mildly dominant left A1 segment. Delayed phase: Hypodensity re - identified in the left SCA territory and stable. No intracranial hemorrhage or mass effect. No abnormal enhancement identified. IMPRESSION: 1. No emergent large vessel occlusion. Negative posterior circulation. Left SCA origin is patent. Poorly visualized vertebral artery origins and proximal segments due to large body habitus. No vertebral artery stenosis is evident. 2. Negative anterior circulation. Retropharyngeal cervical carotid arteries with ICA siphon dolichoectasia, and fetal type bilateral PCA origins. 3. Stable CT appearance of the brain since 0508 hours today with wedge-shaped hypodensity in the left SCA territory suspicious for acute or subacute infarct. Electronically Signed   By: Odessa Fleming M.D.   On: 08/12/2015 07:18   Mr Brain Wo Contrast  08/13/2015  CLINICAL DATA:  Left-sided weakness. Abnormal speech. Cerebellar infarct. Chronic congestive heart failure and end-stage kidney disease. Hyperlipidemia. EXAM: MRI HEAD WITHOUT CONTRAST TECHNIQUE: Multiplanar, multiecho pulse sequences of the brain and surrounding structures were obtained  without intravenous contrast. COMPARISON:  CTA head and neck 08/12/2015. CT head without contrast 08/12/2015. FINDINGS: A left superior cerebellar nonhemorrhagic acute/subacute infarct is confirmed. No acute supratentorial infarct is present. T2 changes correspond with the area of acute infarct. The remote right cerebellar infarct is again noted. There is no significant  supratentorial white matter disease. The ventricles are of normal size. No significant extra-axial fluid collection is present. Flow is present in the major intracranial arteries. Globes and orbits are intact. The paranasal sinuses and mastoid air cells are clear. The skullbase is within normal limits. Midline sagittal images are unremarkable. IMPRESSION: 1. Acute/subacute left superior cerebellar infarct. 2. Remote right cerebellar infarct. 3. No acute supratentorial disease. Electronically Signed   By: Marin Roberts M.D.   On: 08/13/2015 12:46   US Carotid Bilateral  08/12/2015  CLINICAL DATA:  CVA. EXAM: BILATERAL CAROTID DUPLEX ULTRASOUND TECHNIQUE: Angelito Cullens scale imaging, color Doppler and duplex ultrasound were performed of bilateral carotid and vertebral arteries in the neck. COMPARISON:  None. FINDINGS: Criteria: Quantification of carotid stenosis is based on velocity parameters that correlate the residual internal carotid diameter with NASCET-based stenosis levels, using the diameter of the distal internal carotid lumen as the denominator for stenosis measurement. The following velocity measurements were obtained: RIGHT ICA:  78 cm/sec CCA:  95 cm/sec SYSTOLIC ICA/CCA RATIO:  0.8 DIASTOLIC ICA/CCA RATIO:  0.8 ECA:  98 cm/sec LEFT ICA:  70 cm/sec CCA:  106 cm/sec SYSTOLIC ICA/CCA RATIO:  0.7 DIASTOLIC ICA/CCA RATIO:  0.5 ECA:  106 cm/sec RIGHT CAROTID ARTERY: Right carotid arteries are patent without significant plaque or stenosis. Normal waveforms and velocities in the right internal carotid artery. RIGHT VERTEBRAL ARTERY: Antegrade flow and normal waveform in the right vertebral artery. LEFT CAROTID ARTERY: Left carotid arteries are patent without significant plaque or stenosis. Mild intimal thickening in the left common carotid artery. Normal waveforms and velocities in the left internal carotid artery. LEFT VERTEBRAL ARTERY: Antegrade flow and normal waveform in the left vertebral artery.  IMPRESSION: Normal carotid artery examination. No significant carotid artery stenosis or plaque. Electronically Signed   By: Richarda Overlie M.D.   On: 08/12/2015 13:36      Management plans discussed with the patient and he is in agreement. Stable for discharge home with [facility]  Patient should follow up with cardiology and PCP in 1 week  CODE STATUS:     Code Status Orders        Start     Ordered   08/12/15 0922  Full code   Continuous     08/12/15 0921    Code Status History    Date Active Date Inactive Code Status Order ID Comments User Context   This patient has a current code status but no historical code status.      TOTAL TIME TAKING CARE OF THIS PATIENT: 35 minutes.    Note: This dictation was prepared with Dragon dictation along with smaller phrase technology. Any transcriptional errors that result from this process are unintentional.  Kysean Sweet M.D on 08/13/2015 at 3:44 PM  Between 7am to 6pm - Pager - [phone] After 6pm go to [email or web address] - password [email or web address] [facility]  [facility]  Office  [phone]  CC: Primary care physician; Amy Diana Eves, NP

## 2015-08-13 NOTE — Progress Notes (Signed)
Notified by RN CM to assess pt for a possible inpatient acute Rehabilitation admission at [facility]. Noted PT evaluation 2/5 on day of admission. OT Eval. Is pending and pt's discharge is pending today. I am unable to obtain [employer] approval or further assess pt's need for acute inpt rehab admission without further therapy assessments today and medical workup. I will await further information before proceeding. I have left message for RN CM to follow up.  [phone]

## 2015-08-13 NOTE — Telephone Encounter (Signed)
Attempted to contact pt regarding discharge from [facility] on 08/10/15. Melton [name] that answered mobile [phone] states that he just got this # after moving to the area and there is no one there named Earlene Plater.  Front desk called to the floor and will clarify # w/ pt before he leaves the floor, as pt has not left [facility] yet.

## 2015-08-13 NOTE — Care Management (Addendum)
Admitted to [facility] with the diagnosis of stroke. Lives with friend, Ernie Hew. Baby's mother is Cindra Presume [phone]). No home health. No skilled facility. No home oxygen. Uses no aids for ambulation. Takes care of all basic and instrumental activities of daily living himself. Doesn't drive. Works at [employer]. Last seen Dr. Bjorn Pippin 2 months ago at [facility]. No falls. Good appetite.  Physical therapy evaluation completed. Recommending. Acute Inpatient Rehabilitation. States he would be interested in going to [place], if his insurance will pay. States his friend's son is in the home 24/7 and baby's mother is next door. Will text Ottie Glazier RN representative for [facility] Inpatient Acute Rehabilitation.  Ready for discharge today per Dr. Juliene Pina. Gwenette Greet RN MSN CCM Care Management [phone]

## 2015-08-13 NOTE — Progress Notes (Signed)
Okay to d/c orthostatic vitals per Dr. Juliene Pina, verbal in person.

## 2015-08-13 NOTE — Progress Notes (Signed)
Pt offered enema for constipation, pt refuses enema at thsi time.

## 2015-08-13 NOTE — Consult Note (Signed)
    Patient with asymptomatic bradycardia with HR of 46. On Coreg 6.25 mg bid. Will hold medication and assess heart rate for appropriate chronotropic response. If heart improves with holding medication could likely resume lower dose Coreg at 3.125 mg bid and we could see in the office for follow up.    Eula Listen, PA-C 08/13/2015 1:04 PM

## 2015-08-13 NOTE — Evaluation (Signed)
Occupational Therapy Evaluation Patient Details Name: Orlin Kann. MRN: [id number] DOB: 15-Aug-1970 Today's Date: 08/13/2015    History of Present Illness This patient is a 45 year old male who came to [facility] with weakness and dizziness. CT scan shows 1. Recent appearing left superior cerebellar artery infarct. 2. Evaluation of left cerebral cortex is limited due to artifact, as above. 3. No acute hemorrhage   Clinical Impression   This patient is a 45 year old male who came to [facility] stroke symptoms including left sided weakness and dizziness.  He lives in a home with his friend and friend's son.  He had been independent and working full time.  He now shows deficits in balance, strength, coordination and activities of daily living and would benefit from Occupational Therapy for ADL/functional mobility training and L UE restoration.        Follow Up Recommendations  CIR    Equipment Recommendations       Recommendations for Other Services       Precautions / Restrictions Restrictions Weight Bearing Restrictions: No      Mobility Bed Mobility                  Transfers                      Balance                                            ADL                                         General ADL Comments: Patient had been independent and working full time. He now requires minimal to moderate assist for dressing and toilet transfers secondary to reduced balance and coordination. He has difficulty with fasteners such as buttons and snaps and difficulty with tying.     Vision     Perception     Praxis      Pertinent Vitals/Pain       Hand Dominance Right   Extremity/Trunk Assessment Upper Extremity Assessment Upper Extremity Assessment: (R UE 5/5 strength normal ROM and sensation, grip 125 lbs, 9 hole peg test 25 seconds.) LUE Deficits / Details: Strength shoulder 3+/5, elbow  4-/5 wrist 4/5 grip 73 lbs, sensation normal for light touch, temp, and stereognosis, diminished for sharp. 9 hole peg test 63 seconds. Demonstrates ataxia.   Lower Extremity Assessment Lower Extremity Assessment: Defer to PT evaluation       Communication Communication Communication: No difficulties   Cognition Arousal/Alertness: Awake/alert Behavior During Therapy: WFL for tasks assessed/performed Overall Cognitive Status: Within Functional Limits for tasks assessed                     General Comments       Exercises       Shoulder Instructions      Home Living Family/patient expects to be discharged to:: Inpatient rehab Living Arrangements: Non-relatives/Friends Available Help at Discharge: Friend(s)                                    Prior Functioning/Environment Level of Independence: Independent  Comments: Working     OT Diagnosis: Paresis;Ataxia   OT Problem List:     OT Treatment/Interventions: Self-care/ADL training;Therapeutic exercise;Neuromuscular education;DME and/or AE instruction;Therapeutic activities    OT Goals(Current goals can be found in the care plan section) Acute Rehab OT Goals Patient Stated Goal: Would rather go home but willing to go to rehab OT Goal Formulation: With patient Time For Goal Achievement: 08/27/15 Potential to Achieve Goals: Good  OT Frequency:  (Will need daily OT)   Barriers to D/C:            Co-evaluation              End of Session Equipment Utilized During Treatment:  (stroke test kit)  Activity Tolerance:   Patient left:     Time: 1120-1140 OT Time Calculation (min): 20 min Charges:  OT General Charges $OT Visit: 1 Procedure OT Evaluation $OT Eval Low Complexity: 1 Procedure G-Codes:    Myrene Galas, MS/OTR/L  08/13/2015, 11:53 AM

## 2015-08-13 NOTE — Telephone Encounter (Signed)
-----   Message from Joline Maxcy sent at 08/13/2015  4:34 PM EST ----- Regarding: TCM ph D/c 08/13/15 [facility] for cva  Seen for bradycardia    Fu 08/22/15 at 11 am with Alycia Rossetti

## 2015-08-13 NOTE — Progress Notes (Addendum)
Pt is alert and oriented, denies pain, residual weakness and vision impairment on L side, pt is ambulatory in room but reports dizziness, pt advised to using rolling walker, fair appetite, no bm in 3 days, MD notified who recommended enema, pt refuses enema at this time, prune juice provided to patient during shift. Physical therapy worked with patient and recommended home with home health, pt is d/c to home and will be followed [facility], rolling walker  provided to [facility] by [facility]. Pt given written information on diabetes and stroke, prescriptions sent to [facility] on [address], pt d/c by family friend, reports understanding of d/c instructions and has no further questions at this time.

## 2015-08-13 NOTE — Progress Notes (Signed)
Physical Therapy Treatment Patient Details Name: Kent Stanley. MRN: [id number] DOB: [birth year] Today's Date: 08/13/2015    History of Present Illness This patient is a 45 year old male who came to [facility] with weakness and dizziness.    PT Comments    Pt agreeable to PT. Pt progressing sitting balance with no noted left lateral lean with bilateral upper extremity support. Pt is mildly unsteady with stand and notes constant dizziness. Pt is noted to be a moderate fall risk on chart summary and pt notes he has been ambulating to the bathroom himself. Discussed with nurse; alarm pad placed in chair, but at this time not connected. Discussed safety concerns with pt and discussed calling for assist if dizziness persists with stand ambulation to prevent fall. Urinal placed within pt's reach to decrease need to walk to bathroom presently. Due to dizziness and notification from nurse that blood sugar is currently 291 further ambulation and exercise deferred at this time. Continue PT to progress strength, ambulation, balance and safety to allow improved functional mobility.   Follow Up Recommendations  CIR     Equipment Recommendations  Rolling walker with 5" wheels    Recommendations for Other Services       Precautions / Restrictions Precautions Precautions: Fall Restrictions Weight Bearing Restrictions: No    Mobility  Bed Mobility Overal bed mobility: Modified Independent             General bed mobility comments: Increased effort and use of rails. Sits in midline with BUE support  Transfers Overall transfer level: Needs assistance Equipment used: Rolling walker (2 wheeled) (requires bariatric rw) Transfers: Sit to/from Stand Sit to Stand: Min guard (cues for hand placement)         General transfer comment: Requires several rocking attempts to stand, but stands with Min guard assist. Stands in midline with a short time needed to feel steady. Cues to keep feet within rw  for ambulation to chair   Ambulation/Gait Ambulation/Gait assistance: Min guard Ambulation Distance (Feet): 5 Feet Assistive device: Rolling walker (2 wheeled) Gait Pattern/deviations: Decreased step length - right;Decreased step length - left;Shuffle;Wide base of support Gait velocity: reduced Gait velocity interpretation: Below normal speed for age/gender General Gait Details: Pt shuffles sideways bed to chair with difficulty keeping B feet within rw. Pt needs bariatric rw. Pt notes dizziness. Further ambulaion deferred.    Stairs            Wheelchair Mobility    Modified Rankin (Stroke Patients Only)       Balance Overall balance assessment: Needs assistance Sitting-balance support: Bilateral upper extremity supported Sitting balance-Leahy Scale: Good     Standing balance support: Bilateral upper extremity supported Standing balance-Leahy Scale: Fair                      Cognition Arousal/Alertness: Awake/alert Behavior During Therapy: WFL for tasks assessed/performed Overall Cognitive Status: Within Functional Limits for tasks assessed                      Exercises      General Comments        Pertinent Vitals/Pain      Home Living Family/patient expects to be discharged to:: Inpatient rehab Living Arrangements: Non-relatives/Friends Available Help at Discharge: Friend(s)                Prior Function Level of Independence: Independent      Comments: Working  PT Goals (current goals can now be found in the care plan section) Acute Rehab PT Goals Patient Stated Goal: Would rather go home but willing to go to rehab Progress towards PT goals: Progressing toward goals    Frequency  7X/week    PT Plan Current plan remains appropriate    Co-evaluation             End of Session         4098-1191: 1333-1352 PT Time Calculation (min) (ACUTE ONLY): 19 min  Charges:  $Gait Training: 8-22 mins                     G Codes:      Kristeen Miss 08/13/2015, 2:05 PM

## 2015-08-13 NOTE — Care Management (Signed)
Discharge to home today per Dr. Juliene Pina. Discussed Home Health agencies. Rolling walker will be provided by [facility]. Will Dareen Piano, DME representative for [facility] updated. Nursing and physical therapy will be through [facility]. Feliberto Gottron, [facility] representative updated. Friend/family will transport. Gwenette Greet RN MSN CCM Care Management [phone]

## 2015-08-14 NOTE — Telephone Encounter (Signed)
Attempted to contact pt regarding discharge from [facility] on 08/13/15. Left message asking pt to call back regarding discharge instructions and/or medications. Advised pt of appt w/ Eula Listen, PA on 08/22/15 at 11:00 w/ [facility]. Asked pt to call back if unable to keep this appt.

## 2015-08-21 ENCOUNTER — Ambulatory Visit (INDEPENDENT_AMBULATORY_CARE_PROVIDER_SITE_OTHER): Payer: BLUE CROSS/BLUE SHIELD | Admitting: Family Medicine

## 2015-08-21 ENCOUNTER — Encounter: Payer: Self-pay | Admitting: Family Medicine

## 2015-08-21 ENCOUNTER — Telehealth: Payer: Self-pay | Admitting: Family Medicine

## 2015-08-21 VITALS — BP 130/90 | HR 90 | Temp 98.3°F | Resp 16 | Wt 314.4 lb

## 2015-08-21 DIAGNOSIS — I5022 Chronic systolic (congestive) heart failure: Secondary | ICD-10-CM

## 2015-08-21 DIAGNOSIS — Z09 Encounter for follow-up examination after completed treatment for conditions other than malignant neoplasm: Secondary | ICD-10-CM

## 2015-08-21 DIAGNOSIS — E785 Hyperlipidemia, unspecified: Secondary | ICD-10-CM | POA: Diagnosis not present

## 2015-08-21 DIAGNOSIS — I63542 Cerebral infarction due to unspecified occlusion or stenosis of left cerebellar artery: Secondary | ICD-10-CM | POA: Diagnosis not present

## 2015-08-21 DIAGNOSIS — I10 Essential (primary) hypertension: Secondary | ICD-10-CM | POA: Diagnosis not present

## 2015-08-21 DIAGNOSIS — E119 Type 2 diabetes mellitus without complications: Secondary | ICD-10-CM | POA: Diagnosis not present

## 2015-08-21 MED ORDER — BLOOD GLUCOSE MONITOR KIT: PACK | Status: AC

## 2015-08-21 NOTE — Telephone Encounter (Signed)
Kent Stanley with [facility] spoke with pt and since he has appt's several days this week, he wants to do his OT evaluation on Friday or sometime next week .  Her call back number is [phone]

## 2015-08-21 NOTE — Assessment & Plan Note (Signed)
L sided residual. Continue plavix and pravastatin for secondary prevention. Home health PT evaluation pending for rehab.  Refer to neurology for follow-up.  Alarm symptoms reviewed.

## 2015-08-21 NOTE — Patient Instructions (Addendum)
Keep taking your medication for diabetes. We will get you to diabetic education in the hospital.  Check your blood glucose once daily in the morning. Write down the numbers.  Keep your follow-up appt with Dr. Kennith Maes for your heart. We will have you follow-up with neurology- we will schedule that appt.   Please keep your appt with Home Health PT to help regain function on the L sided.   Diabetes Mellitus and Food It is important for you to manage your blood sugar (glucose) level. Your blood glucose level can be greatly affected by what you eat. Eating healthier foods in the appropriate amounts throughout the day at about the same time each day will help you control your blood glucose level. It can also help slow or prevent worsening of your diabetes mellitus. Healthy eating may even help you improve the level of your blood pressure and reach or maintain a healthy weight.  General recommendations for healthful eating and cooking habits include:  Eating meals and snacks regularly. Avoid going long periods of time without eating to lose weight.  Eating a diet that consists mainly of plant-based foods, such as fruits, vegetables, nuts, legumes, and whole grains.  Using low-heat cooking methods, such as baking, instead of high-heat cooking methods, such as deep frying. Work with your dietitian to make sure you understand how to use the Nutrition Facts information on food labels. HOW CAN FOOD AFFECT ME? Carbohydrates Carbohydrates affect your blood glucose level more than any other type of food. Your dietitian will help you determine how many carbohydrates to eat at each meal and teach you how to count carbohydrates. Counting carbohydrates is important to keep your blood glucose at a healthy level, especially if you are using insulin or taking certain medicines for diabetes mellitus. Alcohol Alcohol can cause sudden decreases in blood glucose (hypoglycemia), especially if you use insulin or take certain  medicines for diabetes mellitus. Hypoglycemia can be a life-threatening condition. Symptoms of hypoglycemia (sleepiness, dizziness, and disorientation) are similar to symptoms of having too much alcohol.  If your health care provider has given you approval to drink alcohol, do so in moderation and use the following guidelines:  Women should not have more than one drink per day, and men should not have more than two drinks per day. One drink is equal to:  12 oz of beer.  5 oz of wine.  1 oz of hard liquor.  Do not drink on an empty stomach.  Keep yourself hydrated. Have water, diet soda, or unsweetened iced tea.  Regular soda, juice, and other mixers might contain a lot of carbohydrates and should be counted. WHAT FOODS ARE NOT RECOMMENDED? As you make food choices, it is important to remember that all foods are not the same. Some foods have fewer nutrients per serving than other foods, even though they might have the same number of calories or carbohydrates. It is difficult to get your body what it needs when you eat foods with fewer nutrients. Examples of foods that you should avoid that are high in calories and carbohydrates but low in nutrients include:  Trans fats (most processed foods list trans fats on the Nutrition Facts label).  Regular soda.  Juice.  Candy.  Sweets, such as cake, pie, doughnuts, and cookies.  Fried foods. WHAT FOODS CAN I EAT? Eat nutrient-rich foods, which will nourish your body and keep you healthy. The food you should eat also will depend on several factors, including:  The calories you need.  The medicines you take.  Your weight.  Your blood glucose level.  Your blood pressure level.  Your cholesterol level. You should eat a variety of foods, including:  Protein.  Lean cuts of meat.  Proteins low in saturated fats, such as fish, egg whites, and beans. Avoid processed meats.  Fruits and vegetables.  Fruits and vegetables that may  help control blood glucose levels, such as apples, mangoes, and yams.  Dairy products.  Choose fat-free or low-fat dairy products, such as milk, yogurt, and cheese.  Grains, bread, pasta, and rice.  Choose whole grain products, such as multigrain bread, whole oats, and brown rice. These foods may help control blood pressure.  Fats.  Foods containing healthful fats, such as nuts, avocado, olive oil, canola oil, and fish. DOES EVERYONE WITH DIABETES MELLITUS HAVE THE SAME MEAL PLAN? Because every person with diabetes mellitus is different, there is not one meal plan that works for everyone. It is very important that you meet with a dietitian who will help you create a meal plan that is just right for you.   This information is not intended to replace advice given to you by your health care provider. Make sure you discuss any questions you have with your health care provider.   Document Released: 03/20/2005 Document Revised: 07/14/2014 Document Reviewed: 05/20/2013 Elsevier Interactive Patient Education [employer]

## 2015-08-21 NOTE — Telephone Encounter (Signed)
LMTCB for [employer]

## 2015-08-21 NOTE — Assessment & Plan Note (Signed)
Managed by cardiology. Keep f/u appt tomorrow. Have encouraged pt to ask cardiology if he needs to be on aspirin as the hospital stopped it.

## 2015-08-21 NOTE — Progress Notes (Signed)
Subjective:    Patient ID: Kent Kindred., male    DOB: Dec 11, 1970, 45 y.o.   MRN: [id number]  HPI: Kent Boesel. is a 45 y.o. male presenting on 08/21/2015 for Hospitalization Follow-up   HPI  Pt present for follow-up of stroke. Discharged from [facility] with stroke on Monday 2/6. L sided weakness residual. Some double vision- has resolved. Stroke occurred on Saturday night 2/5- got up to void and couldn't get up. L cerebellar artery infarct.  Did not make appt with neurology in hospital. Follows up with cardiology tomorrow. Stopped aspirin in the hospital.  Diagnosed with diabetes in the hospital:Taking glipizide once daily for diabetes. Does not have glucometer. Gave him some information on diabetes. He has not had diabetes education classes. R foot numbness in the toes.  Home health PT is scheduled to come out this week. Using a walker top ambulate. Some L sided weakness in leg and arm.   Past Medical History  Diagnosis Date  . Seizure disorder ([place])   . History of traumatic injury of head   . Stroke ([place])   . Chronic kidney disease   . Irregular heart beat   . CHF (congestive heart failure) ([place])   . Chronic systolic heart failure ([place]) April of 2014    Ejection fraction less than 20%  . Hyperlipidemia   . Hypertension   . Obesity     Current Outpatient Prescriptions on File Prior to Visit  Medication Sig  . clopidogrel (PLAVIX) 75 MG tablet Take 1 tablet (75 mg total) by mouth daily.  . enalapril (VASOTEC) 5 MG tablet Take 1 tablet (5 mg total) by mouth daily.  . furosemide (LASIX) 40 MG tablet TAKE ONE TABLET BY MOUTH TWICE DAILY  . glipiZIDE (GLUCOTROL) 5 MG tablet Take 0.5 tablets (2.5 mg total) by mouth daily before breakfast.  . pravastatin (PRAVACHOL) 80 MG tablet Take 1 tablet (80 mg total) by mouth daily at 6 PM.  . sildenafil (VIAGRA) 25 MG tablet Take 1 tablet (25 mg total) by mouth daily as needed for erectile dysfunction.   No current  facility-administered medications on file prior to visit.    Review of Systems  Constitutional: Negative for fever and chills.  HENT: Negative.   Respiratory: Negative for chest tightness, shortness of breath and wheezing.   Cardiovascular: Negative for chest pain, palpitations and leg swelling.  Gastrointestinal: Negative for nausea, vomiting and abdominal pain.  Endocrine: Negative.   Genitourinary: Negative for dysuria, urgency, discharge, penile pain and testicular pain.  Musculoskeletal: Negative for back pain, joint swelling and arthralgias.  Skin: Negative.   Neurological: Positive for weakness (L sided) and numbness (R toes. ). Negative for dizziness and headaches.  Psychiatric/Behavioral: Negative for sleep disturbance and dysphoric mood.   Per HPI unless specifically indicated above     Objective:    BP 130/90 mmHg  Pulse 90  Temp(Src) 98.3 F (36.8 C) (Oral)  Resp 16  Wt 314 lb 6.4 oz (142.611 kg)  Wt Readings from Last 3 Encounters:  08/21/15 314 lb 6.4 oz (142.611 kg)  08/12/15 335 lb (151.955 kg)  12/25/14 325 lb (147.419 kg)    Physical Exam  Constitutional: He is oriented to person, place, and time. He appears well-developed and well-nourished. No distress.  HENT:  Head: Normocephalic and atraumatic.  Eyes: Conjunctivae and EOM are normal. Pupils are equal, round, and reactive to light.  Neck: Neck supple. No thyromegaly present.  Cardiovascular: Normal rate, regular rhythm and  normal heart sounds.  Exam reveals no gallop and no friction rub.   No murmur heard. Pulmonary/Chest: Effort normal and breath sounds normal. He has no wheezes.  Abdominal: Soft. Bowel sounds are normal. He exhibits no distension. There is no tenderness. There is no rebound.  Musculoskeletal: Normal range of motion. He exhibits no edema or tenderness.  Neurological: He is alert and oriented to person, place, and time. No cranial nerve deficit or sensory deficit. He displays a negative  Romberg sign.  Reflex Scores:      Patellar reflexes are 2+ on the right side and 1+ on the left side. L sided 4/5 strength LUE/LLE  Skin: Skin is warm and dry. No rash noted. No erythema.  Psychiatric: He has a normal mood and affect. His behavior is normal. Thought content normal.   Results for orders placed or performed during the hospital encounter of 08/12/15  CBC  Result Value Ref Range   WBC 9.5 3.8 - 10.6 K/uL   RBC 4.50 4.40 - 5.90 MIL/uL   Hemoglobin 14.2 13.0 - 18.0 g/dL   HCT 43.5 40.0 - 52.0 %   MCV 96.6 80.0 - 100.0 fL   MCH 31.6 26.0 - 34.0 pg   MCHC 32.7 32.0 - 36.0 g/dL   RDW 14.6 (H) 11.5 - 14.5 %   Platelets 191 150 - 440 K/uL  Comprehensive metabolic panel  Result Value Ref Range   Sodium 138 135 - 145 mmol/L   Potassium 3.9 3.5 - 5.1 mmol/L   Chloride 101 101 - 111 mmol/L   CO2 33 (H) 22 - 32 mmol/L   Glucose, Bld 176 (H) 65 - 99 mg/dL   BUN 25 (H) 6 - 20 mg/dL   Creatinine, Ser 1.89 (H) 0.61 - 1.24 mg/dL   Calcium 8.9 8.9 - 10.3 mg/dL   Total Protein 7.6 6.5 - 8.1 g/dL   Albumin 3.7 3.5 - 5.0 g/dL   AST 24 15 - 41 U/L   ALT 22 17 - 63 U/L   Alkaline Phosphatase 44 38 - 126 U/L   Total Bilirubin 0.6 0.3 - 1.2 mg/dL   GFR calc non Af Amer 41 (L) >60 mL/min   GFR calc Af Amer 48 (L) >60 mL/min   Anion gap 4 (L) 5 - 15  Troponin I  Result Value Ref Range   Troponin I 0.08 (H) <0.031 ng/mL  Protime-INR  Result Value Ref Range   Prothrombin Time 13.4 11.4 - 15.0 seconds   INR 1.00   APTT  Result Value Ref Range   aPTT 28 24 - 36 seconds  Hemoglobin A1c  Result Value Ref Range   Hgb A1c MFr Bld 7.5 (H) 4.0 - 6.0 %  Lipid panel  Result Value Ref Range   Cholesterol 190 0 - 200 mg/dL   Triglycerides 162 (H) <150 mg/dL   HDL 31 (L) >40 mg/dL   Total CHOL/HDL Ratio 6.1 RATIO   VLDL 32 0 - 40 mg/dL   LDL Cholesterol 127 (H) 0 - 99 mg/dL  Basic metabolic panel  Result Value Ref Range   Sodium 139 135 - 145 mmol/L   Potassium 3.7 3.5 - 5.1 mmol/L     Chloride 102 101 - 111 mmol/L   CO2 25 22 - 32 mmol/L   Glucose, Bld 153 (H) 65 - 99 mg/dL   BUN 19 6 - 20 mg/dL   Creatinine, Ser 1.52 (H) 0.61 - 1.24 mg/dL   Calcium 9.0 8.9 - 10.3 mg/dL  GFR calc non Af Amer 53 (L) >60 mL/min   GFR calc Af Amer >60 >60 mL/min   Anion gap 12 5 - 15  CBC  Result Value Ref Range   WBC 9.3 3.8 - 10.6 K/uL   RBC 4.53 4.40 - 5.90 MIL/uL   Hemoglobin 14.4 13.0 - 18.0 g/dL   HCT 44.0 40.0 - 52.0 %   MCV 97.1 80.0 - 100.0 fL   MCH 31.7 26.0 - 34.0 pg   MCHC 32.6 32.0 - 36.0 g/dL   RDW 14.8 (H) 11.5 - 14.5 %   Platelets 166 150 - 440 K/uL  Glucose, capillary  Result Value Ref Range   Glucose-Capillary 291 (H) 65 - 99 mg/dL  Glucose, capillary  Result Value Ref Range   Glucose-Capillary 162 (H) 65 - 99 mg/dL      Assessment & Plan:   Problem List Items Addressed This Visit      Cardiovascular and Mediastinum   Chronic systolic heart failure ([place])    Managed by cardiology. Keep f/u appt tomorrow. Have encouraged pt to ask cardiology if he needs to be on aspirin as the hospital stopped it.       Hypertension    Controlled. Continue to check at home. Continue current regimen. Recheck kidney function with [facility].       Relevant Orders   Comprehensive metabolic panel   Stroke (cerebrum) ([facility])    L sided residual. Continue plavix and pravastatin for secondary prevention. Home health PT evaluation pending for rehab.  Refer to neurology for follow-up.  Alarm symptoms reviewed.       Relevant Orders   Ambulatory referral to Neurology     Endocrine   New onset type 2 diabetes mellitus ([place])    Refer to diabetes education class. Continue glipizide for kidney function. Encouraged pt to check blood glucose once daily.  Reduce bread, rice pasta, reduce juice and soda. Increase exercise as able.  Recheck 3 mos.         Relevant Medications   blood glucose meter kit and supplies KIT   Other Relevant Orders   Ambulatory referral to diabetic  education     Other   Hyperlipidemia    Other Visit Diagnoses    Hospital discharge follow-up    -  Primary       Meds ordered this encounter  Medications  . blood glucose meter kit and supplies KIT    Sig: Dispense based on patient and insurance preference. Check blood glucose once daily.    Dispense:  1 each    Refill:  0    Order Specific Question:  Supervising Provider    Answer:  Arlis Porta [[id number]]    Order Specific Question:  Number of strips    Answer:  30    Order Specific Question:  Number of lancets    Answer:  30      Follow up plan: Return in about 3 months (around 11/18/2015) for diabetes. [name]

## 2015-08-21 NOTE — Assessment & Plan Note (Signed)
Refer to diabetes education class. Continue glipizide for kidney function. Encouraged pt to check blood glucose once daily.  Reduce bread, rice pasta, reduce juice and soda. Increase exercise as able.  Recheck 3 mos.

## 2015-08-21 NOTE — Assessment & Plan Note (Signed)
Controlled. Continue to check at home. Continue current regimen. Recheck kidney function with [facility].

## 2015-08-22 ENCOUNTER — Encounter: Payer: Self-pay | Admitting: Physician Assistant

## 2015-08-22 ENCOUNTER — Encounter: Payer: BLUE CROSS/BLUE SHIELD | Admitting: Physician Assistant

## 2015-08-22 NOTE — Telephone Encounter (Signed)
LMTCB no reply aware of her message.

## 2015-08-27 ENCOUNTER — Encounter: Payer: BLUE CROSS/BLUE SHIELD | Admitting: Physician Assistant

## 2015-09-10 ENCOUNTER — Other Ambulatory Visit: Payer: Self-pay | Admitting: Family Medicine

## 2015-09-10 NOTE — Telephone Encounter (Signed)
Pt needs refills on clopidogrel, glipizide, pravastatin and enalapril sent to [facility] [address]  His call back is [phone]

## 2015-09-11 NOTE — Telephone Encounter (Signed)
His clopidogrel and glipizide and pravastatin was send by Dr. Patricia Pesa mody 08/2015 and enalapril was send by Dr. Kirke Corin do you want to send all this refill pt was requesting and I told him that I will confirm with you tomorrow.

## 2015-09-12 ENCOUNTER — Encounter: Payer: Self-pay | Admitting: Family Medicine

## 2015-09-12 ENCOUNTER — Telehealth: Payer: Self-pay | Admitting: Family Medicine

## 2015-09-12 DIAGNOSIS — Z8673 Personal history of transient ischemic attack (TIA), and cerebral infarction without residual deficits: Secondary | ICD-10-CM

## 2015-09-12 MED ORDER — GLIPIZIDE 5 MG PO TABS: 2.5000 mg | ORAL_TABLET | Freq: Every day | ORAL | Status: DC

## 2015-09-12 MED ORDER — PRAVASTATIN SODIUM 80 MG PO TABS: 80.0000 mg | ORAL_TABLET | Freq: Every day | ORAL | Status: DC

## 2015-09-12 MED ORDER — ENALAPRIL MALEATE 5 MG PO TABS: 5.0000 mg | ORAL_TABLET | Freq: Every day | ORAL | Status: DC

## 2015-09-12 MED ORDER — CLOPIDOGREL BISULFATE 75 MG PO TABS: 75.0000 mg | ORAL_TABLET | Freq: Every day | ORAL | Status: DC

## 2015-09-12 NOTE — Telephone Encounter (Signed)
Called pt for clarification on FMLA paperwork.

## 2015-09-12 NOTE — Progress Notes (Signed)
FMLA form for [employer] completed.

## 2015-09-12 NOTE — Telephone Encounter (Signed)
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 I will also refill the others. Thanks! AK

## 2015-09-17 ENCOUNTER — Encounter: Payer: Self-pay | Admitting: Family Medicine

## 2015-09-17 ENCOUNTER — Ambulatory Visit: Payer: BLUE CROSS/BLUE SHIELD | Admitting: Family Medicine

## 2015-09-25 ENCOUNTER — Encounter: Payer: Self-pay | Admitting: Family Medicine

## 2015-09-28 ENCOUNTER — Encounter: Payer: BLUE CROSS/BLUE SHIELD | Admitting: Cardiovascular Disease

## 2015-10-16 ENCOUNTER — Telehealth: Payer: Self-pay

## 2015-10-16 NOTE — Telephone Encounter (Signed)
Mr called ant to become a patient of [facility], he lost his job and has no insurance and almost out of meds   [phone]

## 2015-10-23 NOTE — Telephone Encounter (Signed)
Spoke to mr he will come by today and get and get an application to become a new patient

## 2015-11-13 ENCOUNTER — Encounter: Payer: Self-pay | Admitting: Family Medicine

## 2015-11-13 DIAGNOSIS — E119 Type 2 diabetes mellitus without complications: Secondary | ICD-10-CM

## 2015-11-13 DIAGNOSIS — I63542 Cerebral infarction due to unspecified occlusion or stenosis of left cerebellar artery: Secondary | ICD-10-CM

## 2015-11-13 NOTE — Progress Notes (Signed)
FMLA paperwork re certification.

## 2015-11-19 ENCOUNTER — Ambulatory Visit: Payer: BLUE CROSS/BLUE SHIELD | Admitting: Family Medicine

## 2015-11-26 ENCOUNTER — Encounter (INDEPENDENT_AMBULATORY_CARE_PROVIDER_SITE_OTHER): Payer: Self-pay

## 2015-11-26 ENCOUNTER — Ambulatory Visit: Payer: BLUE CROSS/BLUE SHIELD

## 2015-12-26 ENCOUNTER — Telehealth: Payer: Self-pay

## 2015-12-27 NOTE — Telephone Encounter (Signed)
Initial appointment made for patient on 01/03/2016 @ 9am

## 2016-01-03 ENCOUNTER — Ambulatory Visit: Payer: BLUE CROSS/BLUE SHIELD | Admitting: Family

## 2016-01-10 ENCOUNTER — Telehealth: Payer: Self-pay | Admitting: Cardiovascular Disease

## 2016-01-10 NOTE — Telephone Encounter (Signed)
Received records request from Disability Determination services  , forwarded to [facility] for processing sent to [employer] .

## 2016-01-23 ENCOUNTER — Telehealth: Payer: Self-pay | Admitting: Family Medicine

## 2016-01-23 NOTE — Telephone Encounter (Signed)
Pt cancelled appt for Friday.  He said he was going to hospital, he thought he was having a stroke.

## 2016-01-24 NOTE — Telephone Encounter (Signed)
Noted. I will look for notes. Thanks! AK

## 2016-01-25 ENCOUNTER — Ambulatory Visit: Payer: BLUE CROSS/BLUE SHIELD | Admitting: Family Medicine

## 2016-02-07 ENCOUNTER — Telehealth: Payer: Self-pay | Admitting: Family Medicine

## 2016-02-07 ENCOUNTER — Ambulatory Visit: Payer: BLUE CROSS/BLUE SHIELD | Admitting: Family Medicine

## 2016-02-07 NOTE — Telephone Encounter (Signed)
Verbally reviewed no-show policy with the patient. He has received letters in the past.  He is aware and verbalizes understanding. Next no-show or same day cancellation, he will be discharged from the practice. Thanks! AK

## 2016-02-07 NOTE — Telephone Encounter (Signed)
Called pt to follow-up on No show appt. LMTCB  Please inform patient that this was his third no-show at our practice. He needs to call 24 hours in advance to cancel appt if he cannot make it. If he makes and misses or same day cancels another appt, he will be discharged from our practice.  Thanks! AK

## 2016-02-12 ENCOUNTER — Ambulatory Visit
Admission: RE | Admit: 2016-02-12 | Discharge: 2016-02-12 | Disposition: A | Discharge status: Home / Self Care | Payer: Medicaid Other | Source: Ambulatory Visit | Attending: Family Medicine | Admitting: Family Medicine

## 2016-02-12 ENCOUNTER — Ambulatory Visit: Payer: BLUE CROSS/BLUE SHIELD | Admitting: Cardiovascular Disease

## 2016-02-12 ENCOUNTER — Ambulatory Visit (INDEPENDENT_AMBULATORY_CARE_PROVIDER_SITE_OTHER): Payer: Medicaid Other | Admitting: Family Medicine

## 2016-02-12 VITALS — BP 122/84 | HR 98 | Temp 98.9°F | Resp 16 | Ht 72.0 in | Wt 332.6 lb

## 2016-02-12 DIAGNOSIS — R0789 Other chest pain: Secondary | ICD-10-CM | POA: Diagnosis not present

## 2016-02-12 DIAGNOSIS — I10 Essential (primary) hypertension: Secondary | ICD-10-CM | POA: Diagnosis not present

## 2016-02-12 DIAGNOSIS — I5022 Chronic systolic (congestive) heart failure: Secondary | ICD-10-CM | POA: Diagnosis not present

## 2016-02-12 DIAGNOSIS — E1122 Type 2 diabetes mellitus with diabetic chronic kidney disease: Secondary | ICD-10-CM | POA: Diagnosis not present

## 2016-02-12 DIAGNOSIS — R0982 Postnasal drip: Secondary | ICD-10-CM

## 2016-02-12 DIAGNOSIS — N183 Chronic kidney disease, stage 3 unspecified: Secondary | ICD-10-CM

## 2016-02-12 DIAGNOSIS — Z8673 Personal history of transient ischemic attack (TIA), and cerebral infarction without residual deficits: Secondary | ICD-10-CM

## 2016-02-12 LAB — COMPLETE METABOLIC PANEL WITH GFR
ALBUMIN: 3.7 g/dL (ref 3.6–5.1)
ALK PHOS: 47 U/L (ref 40–115)
ALT: 27 U/L (ref 9–46)
AST: 27 U/L (ref 10–40)
BUN: 17 mg/dL (ref 7–25)
CALCIUM: 9.2 mg/dL (ref 8.6–10.3)
CO2: 28 mmol/L (ref 20–31)
CREATININE: 1.62 mg/dL — AB (ref 0.60–1.35)
Chloride: 104 mmol/L (ref 98–110)
GFR, Est African American: 58 mL/min — ABNORMAL LOW (ref >=60)
GFR, Est Non African American: 50 mL/min — ABNORMAL LOW (ref >=60)
Glucose, Bld: 99 mg/dL (ref 65–99)
Potassium: 4.5 mmol/L (ref 3.5–5.3)
Sodium: 144 mmol/L (ref 135–146)
TOTAL PROTEIN: 6.5 g/dL (ref 6.1–8.1)
Total Bilirubin: 0.7 mg/dL (ref 0.2–1.2)

## 2016-02-12 LAB — LIPID PANEL
CHOLESTEROL: 127 mg/dL (ref 125–200)
HDL: 28 mg/dL — ABNORMAL LOW (ref >=40)
LDL Cholesterol: 77 mg/dL (ref <130)
Total CHOL/HDL Ratio: 4.5 Ratio (ref <=5.0)
Triglycerides: 108 mg/dL (ref <150)
VLDL: 22 mg/dL (ref <30)

## 2016-02-12 LAB — POCT UA - MICROALBUMIN: Microalbumin Ur, POC: 0 mg/L

## 2016-02-12 LAB — POCT GLYCOSYLATED HEMOGLOBIN (HGB A1C): Hemoglobin A1C: 7.1

## 2016-02-12 MED ORDER — ALBUTEROL SULFATE HFA 108 (90 BASE) MCG/ACT IN AERS: 2.0000 | INHALATION_SPRAY | Freq: Four times a day (QID) | RESPIRATORY_TRACT | 5 refills | Status: DC | PRN

## 2016-02-12 MED ORDER — DM-GUAIFENESIN ER 30-600 MG PO TB12: 1.0000 | ORAL_TABLET | Freq: Two times a day (BID) | ORAL | 0 refills | Status: DC

## 2016-02-12 MED ORDER — ENALAPRIL MALEATE 2.5 MG PO TABS: 2.5000 mg | ORAL_TABLET | Freq: Every day | ORAL | 5 refills | Status: DC

## 2016-02-12 MED ORDER — CLOPIDOGREL BISULFATE 75 MG PO TABS: 75.0000 mg | ORAL_TABLET | Freq: Every day | ORAL | 11 refills | Status: DC

## 2016-02-12 NOTE — Progress Notes (Signed)
Subjective:    Patient ID: Kent Kindred., male    DOB: 1971/01/29, 45 y.o.   MRN: [id number]  HPI: Kent Friedel. is a 45 y.o. male presenting on 02/12/2016 for Insomnia   HPI  Pt presents for follow-up visit. Has not been here in > 6 mos. He had a CVA in Feb 2017 but lost his insurance. Was seeing provider at [facility]. He reports they took him off his medications. When his medications where changed- he started coughing a lot.  Coughing up white/green sputum daily. Cough started about 7/14. Metoprolol '25mg'$  once daily. Reports some shortness of breath. Chest was feeling tight- has improved now. But still present.He did not seek medical attention at that time.   Diabetes: Ran out of medications for 2 mos.. A1c is down to 7.1%. Has been on medication steadily for the past 1 mos.  Is not taking Plavix anymore. Unclear if he ran out or was stopped by cardiologist seen in July.  He was placed on plavix for his CVA in Feb. His right sideded weakness has improved.   Past Medical History:  Diagnosis Date  . CHF (congestive heart failure) ([place])   . Chronic systolic heart failure ([place]) April of 2014   Ejection fraction less than 20%  . CKD (chronic kidney disease), stage II   . History of traumatic injury of head   . Hyperlipidemia   . Hypertension   . Irregular heart beat   . Morbid obesity ([place])   . Seizure disorder ([place])   . Stroke [facility])     Current Outpatient Prescriptions on File Prior to Visit  Medication Sig  . blood glucose meter kit and supplies KIT Dispense based on patient and insurance preference. Check blood glucose once daily.  . furosemide (LASIX) 40 MG tablet TAKE ONE TABLET BY MOUTH TWICE DAILY  . glipiZIDE (GLUCOTROL) 5 MG tablet Take 0.5 tablets (2.5 mg total) by mouth daily before breakfast.  . pravastatin (PRAVACHOL) 80 MG tablet Take 1 tablet (80 mg total) by mouth daily at 6 PM.  . sildenafil (VIAGRA) 25 MG tablet Take 1 tablet (25 mg total) by mouth daily as  needed for erectile dysfunction.   No current facility-administered medications on file prior to visit.     Review of Systems  Constitutional: Negative for chills and fever.  HENT: Negative.   Respiratory: Positive for cough and chest tightness. Negative for shortness of breath and wheezing.   Cardiovascular: Positive for leg swelling. Negative for chest pain and palpitations.  Gastrointestinal: Negative for abdominal pain, nausea and vomiting.  Endocrine: Negative.   Genitourinary: Negative for discharge, dysuria, penile pain, testicular pain and urgency.  Musculoskeletal: Negative for arthralgias, back pain and joint swelling.  Skin: Negative.   Neurological: Negative for dizziness, weakness, numbness and headaches.  Psychiatric/Behavioral: Negative for dysphoric mood and sleep disturbance.   Per HPI unless specifically indicated above     Objective:    BP 122/84 (BP Location: Left Arm, Patient Position: Sitting, Cuff Size: Large)   Pulse 98   Temp 98.9 F (37.2 C) (Oral)   Resp 16   Ht 6' (1.829 m)   Wt (!) 332 lb 9.6 oz (150.9 kg)   SpO2 99%   BMI 45.11 kg/m   Wt Readings from Last 3 Encounters:  02/12/16 (!) 332 lb 9.6 oz (150.9 kg)  08/21/15 (!) 314 lb 6.4 oz (142.6 kg)  08/12/15 (!) 335 lb (152 kg)    Physical Exam  Constitutional: He is oriented to person, place, and time. He appears well-developed and well-nourished. No distress.  HENT:  Head: Normocephalic and atraumatic.  Neck: Neck supple. No thyromegaly present.  Cardiovascular: Normal rate, regular rhythm and normal heart sounds.  Exam reveals no gallop and no friction rub.   No murmur heard. Pulmonary/Chest: Effort normal and breath sounds normal. He has no wheezes.  Abdominal: Soft. Bowel sounds are normal. He exhibits no distension. There is no tenderness. There is no rebound.  Musculoskeletal: Normal range of motion. He exhibits no edema or tenderness.  Neurological: He is alert and oriented to  person, place, and time. He has normal reflexes.  Skin: Skin is warm and dry. No rash noted. No erythema.  Psychiatric: He has a normal mood and affect. His behavior is normal. Thought content normal.   Results for orders placed or performed in visit on 02/12/16  POCT HgB A1C  Result Value Ref Range   Hemoglobin A1C 7.1   POCT UA - Microalbumin  Result Value Ref Range   Microalbumin Ur, POC 0 mg/L   Creatinine, POC  mg/dL   Albumin/Creatinine Ratio, Urine, POC        Assessment & Plan:   Problem List Items Addressed This Visit      Cardiovascular and Mediastinum   Chronic systolic heart failure (HCC)    Appear euvolemic today despite cough. Continue current medications and set pt up for cardiology follow-up.       Relevant Medications   metoprolol succinate (TOPROL-XL) 25 MG 24 hr tablet   enalapril (VASOTEC) 2.5 MG tablet   Other Relevant Orders   B Nat Peptide   DG Chest 2 View (Completed)   Hypertension    Controlled today. Will start low dose enalapril back 2/2 CHF. Monitor BP closely. Check CMP. RTC 4 weeks.       Relevant Medications   metoprolol succinate (TOPROL-XL) 25 MG 24 hr tablet   enalapril (VASOTEC) 2.5 MG tablet     Endocrine   New onset type 2 diabetes mellitus (HCC) - Primary    Consider increasing glipizide pending labs. UA micro done. Pt needs eye exam. Recheck 3 mos.       Relevant Medications   enalapril (VASOTEC) 2.5 MG tablet     Other   Cerebrovascular accident, old    R strength has improved. Will restart plavix today as prescribed after stroke. Continue baby aspirin.       Relevant Medications   clopidogrel (PLAVIX) 75 MG tablet   Other Relevant Orders   Lipid Profile    Other Visit Diagnoses    Chest tightness       Improved on visit today. Add albuterol. CXR negative. Likely a viral issue that resolved prior to pt visit.    Relevant Medications   albuterol (PROVENTIL HFA;VENTOLIN HFA) 108 (90 Base) MCG/ACT inhaler   Post-nasal  drip       Mucinex DM PRN.    Relevant Medications   dextromethorphan-guaiFENesin (MUCINEX DM) 30-600 MG 12hr tablet      Meds ordered this encounter  Medications  . metoprolol succinate (TOPROL-XL) 25 MG 24 hr tablet    Sig: Take 25 mg by mouth daily.  . clopidogrel (PLAVIX) 75 MG tablet    Sig: Take 1 tablet (75 mg total) by mouth daily.    Dispense:  30 tablet    Refill:  11    Order Specific Question:   Supervising Provider    Answer:   Juanetta Gosling  Ronelle Nigh [[id number]]  . enalapril (VASOTEC) 2.5 MG tablet    Sig: Take 1 tablet (2.5 mg total) by mouth daily.    Dispense:  30 tablet    Refill:  5    Order Specific Question:   Supervising Provider    Answer:   Arlis Porta [phone]  . albuterol (PROVENTIL HFA;VENTOLIN HFA) 108 (90 Base) MCG/ACT inhaler    Sig: Inhale 2 puffs into the lungs every 6 (six) hours as needed for wheezing or shortness of breath.    Dispense:  1 Inhaler    Refill:  5    Order Specific Question:   Supervising Provider    Answer:   Arlis Porta [[id number]]  . dextromethorphan-guaiFENesin (MUCINEX DM) 30-600 MG 12hr tablet    Sig: Take 1 tablet by mouth 2 (two) times daily.    Dispense:  20 tablet    Refill:  0    Order Specific Question:   Supervising Provider    Answer:   Arlis Porta [[id number]]      Follow up plan: Return in about 4 weeks (around 03/11/2016) for BP check. [name]

## 2016-02-12 NOTE — Patient Instructions (Signed)
We will check labwork to day to determine your kidney function.  We made you an appt to see cardiology for follow-up on your heart failure.  Weigh yourself daily. If you gain 2lbs overnight or 5lbs in 5 days- please let us know.  Please seek immediate medical attention if you develop shortness of breath not relieve by inhaler, chest pain/tightness, fever > 103 F or other concerning symptoms.  Please seek immediate medical attention at ER or Urgent Care if you develop: Chest pain, pressure or tightness. Shortness of breath accompanied by nausea or diaphoresis Visual changes Numbness or tingling on one side of the body Facial droop Altered mental status Or any concerning symptoms.

## 2016-02-12 NOTE — Assessment & Plan Note (Signed)
R strength has improved. Will restart plavix today as prescribed after stroke. Continue baby aspirin.

## 2016-02-12 NOTE — Assessment & Plan Note (Signed)
Consider increasing glipizide pending labs. UA micro done. Pt needs eye exam. Recheck 3 mos.

## 2016-02-12 NOTE — Assessment & Plan Note (Signed)
Appear euvolemic today despite cough. Continue current medications and set pt up for cardiology follow-up.

## 2016-02-12 NOTE — Assessment & Plan Note (Signed)
Controlled today. Will start low dose enalapril back 2/2 CHF. Monitor BP closely. Check CMP. RTC 4 weeks.

## 2016-02-13 LAB — BRAIN NATRIURETIC PEPTIDE: Brain Natriuretic Peptide: 403.8 pg/mL — ABNORMAL HIGH (ref <100)

## 2016-02-15 ENCOUNTER — Telehealth: Payer: Self-pay | Admitting: Family Medicine

## 2016-02-15 MED ORDER — RANITIDINE HCL 150 MG PO CAPS
150.0000 mg | ORAL_CAPSULE | Freq: Two times a day (BID) | ORAL | 5 refills | Status: DC
Start: 2016-02-15 — End: 2016-03-28

## 2016-02-15 NOTE — Telephone Encounter (Signed)
Pt asked to a call to find out exactly what he can take for pain since he can't take tylenol.  His call back number is [phone]

## 2016-02-15 NOTE — Telephone Encounter (Signed)
Pt reports he has been taking advil at home. He is reporting pain around his heart on and off throughout the week. Pain "around his heart" is occurring 3 times per week. This pain has been present for 6 mos. He cannot remember if he addressed it with his cardiologist.  No shortness of breath.  Pt advised to call cardiology to discuss his symptoms to determine if he needs to be seen sooner. Worsening chest pain should be seen in the ER. Possible GERD source of symptoms. Will trial Zantac while waiting for cardiology.

## 2016-03-13 ENCOUNTER — Ambulatory Visit: Payer: BLUE CROSS/BLUE SHIELD | Admitting: Cardiovascular Disease

## 2016-03-21 ENCOUNTER — Ambulatory Visit: Payer: Medicaid Other | Admitting: Family Medicine

## 2016-03-27 ENCOUNTER — Emergency Department: Payer: Medicaid Other

## 2016-03-27 ENCOUNTER — Ambulatory Visit (INDEPENDENT_AMBULATORY_CARE_PROVIDER_SITE_OTHER): Payer: Medicaid Other | Admitting: Family Medicine

## 2016-03-27 ENCOUNTER — Observation Stay
Admission: EM | Admit: 2016-03-27 | Discharge: 2016-03-28 | Disposition: A | Discharge status: Home / Self Care | Payer: Medicaid Other | Attending: Internal Medicine | Admitting: Internal Medicine

## 2016-03-27 VITALS — BP 138/82 | HR 105 | Temp 98.8°F | Resp 26 | Ht 72.0 in | Wt 336.4 lb

## 2016-03-27 DIAGNOSIS — Z6841 Body Mass Index (BMI) 40.0 and over, adult: Secondary | ICD-10-CM | POA: Insufficient documentation

## 2016-03-27 DIAGNOSIS — Z8249 Family history of ischemic heart disease and other diseases of the circulatory system: Secondary | ICD-10-CM | POA: Diagnosis not present

## 2016-03-27 DIAGNOSIS — R0602 Shortness of breath: Secondary | ICD-10-CM

## 2016-03-27 DIAGNOSIS — Z87891 Personal history of nicotine dependence: Secondary | ICD-10-CM | POA: Diagnosis not present

## 2016-03-27 DIAGNOSIS — J810 Acute pulmonary edema: Secondary | ICD-10-CM

## 2016-03-27 DIAGNOSIS — E785 Hyperlipidemia, unspecified: Secondary | ICD-10-CM | POA: Insufficient documentation

## 2016-03-27 DIAGNOSIS — Z7902 Long term (current) use of antithrombotics/antiplatelets: Secondary | ICD-10-CM | POA: Insufficient documentation

## 2016-03-27 DIAGNOSIS — N451 Epididymitis: Secondary | ICD-10-CM | POA: Insufficient documentation

## 2016-03-27 DIAGNOSIS — I130 Hypertensive heart and chronic kidney disease with heart failure and stage 1 through stage 4 chronic kidney disease, or unspecified chronic kidney disease: Secondary | ICD-10-CM | POA: Insufficient documentation

## 2016-03-27 DIAGNOSIS — Z833 Family history of diabetes mellitus: Secondary | ICD-10-CM | POA: Diagnosis not present

## 2016-03-27 DIAGNOSIS — I10 Essential (primary) hypertension: Secondary | ICD-10-CM

## 2016-03-27 DIAGNOSIS — R0600 Dyspnea, unspecified: Secondary | ICD-10-CM | POA: Diagnosis present

## 2016-03-27 DIAGNOSIS — R079 Chest pain, unspecified: Secondary | ICD-10-CM

## 2016-03-27 DIAGNOSIS — I2510 Atherosclerotic heart disease of native coronary artery without angina pectoris: Secondary | ICD-10-CM | POA: Insufficient documentation

## 2016-03-27 DIAGNOSIS — I5022 Chronic systolic (congestive) heart failure: Secondary | ICD-10-CM | POA: Diagnosis not present

## 2016-03-27 DIAGNOSIS — Z808 Family history of malignant neoplasm of other organs or systems: Secondary | ICD-10-CM | POA: Insufficient documentation

## 2016-03-27 DIAGNOSIS — E1122 Type 2 diabetes mellitus with diabetic chronic kidney disease: Secondary | ICD-10-CM | POA: Diagnosis not present

## 2016-03-27 DIAGNOSIS — I420 Dilated cardiomyopathy: Secondary | ICD-10-CM | POA: Insufficient documentation

## 2016-03-27 DIAGNOSIS — I5023 Acute on chronic systolic (congestive) heart failure: Secondary | ICD-10-CM | POA: Insufficient documentation

## 2016-03-27 DIAGNOSIS — G40909 Epilepsy, unspecified, not intractable, without status epilepticus: Secondary | ICD-10-CM

## 2016-03-27 DIAGNOSIS — Z79899 Other long term (current) drug therapy: Secondary | ICD-10-CM | POA: Diagnosis not present

## 2016-03-27 DIAGNOSIS — N183 Chronic kidney disease, stage 3 (moderate): Secondary | ICD-10-CM | POA: Diagnosis not present

## 2016-03-27 DIAGNOSIS — Z8673 Personal history of transient ischemic attack (TIA), and cerebral infarction without residual deficits: Secondary | ICD-10-CM | POA: Diagnosis not present

## 2016-03-27 DIAGNOSIS — I428 Other cardiomyopathies: Secondary | ICD-10-CM

## 2016-03-27 DIAGNOSIS — F1910 Other psychoactive substance abuse, uncomplicated: Secondary | ICD-10-CM | POA: Diagnosis present

## 2016-03-27 HISTORY — DX: Other psychoactive substance abuse, uncomplicated: F19.10

## 2016-03-27 HISTORY — DX: Patient's noncompliance with other medical treatment and regimen: Z91.19

## 2016-03-27 HISTORY — DX: Atherosclerotic heart disease of native coronary artery without angina pectoris: I25.10

## 2016-03-27 HISTORY — DX: Other cardiomyopathies: I42.8

## 2016-03-27 HISTORY — DX: Patient's noncompliance with other medical treatment and regimen due to unspecified reason: Z91.199

## 2016-03-27 LAB — COMPREHENSIVE METABOLIC PANEL
ALT: 24 U/L (ref 17–63)
ANION GAP: 4 — AB (ref 5–15)
AST: 25 U/L (ref 15–41)
Albumin: 3.6 g/dL (ref 3.5–5.0)
Alkaline Phosphatase: 48 U/L (ref 38–126)
BUN: 16 mg/dL (ref 6–20)
CHLORIDE: 107 mmol/L (ref 101–111)
CO2: 28 mmol/L (ref 22–32)
Calcium: 8.8 mg/dL — ABNORMAL LOW (ref 8.9–10.3)
Creatinine, Ser: 1.42 mg/dL — ABNORMAL HIGH (ref 0.61–1.24)
GFR, EST NON AFRICAN AMERICAN: 58 mL/min — AB (ref >60)
Glucose, Bld: 141 mg/dL — ABNORMAL HIGH (ref 65–99)
POTASSIUM: 3.7 mmol/L (ref 3.5–5.1)
SODIUM: 139 mmol/L (ref 135–145)
Total Bilirubin: 0.7 mg/dL (ref 0.3–1.2)
Total Protein: 7.4 g/dL (ref 6.5–8.1)

## 2016-03-27 LAB — PROTIME-INR
INR: 1.11
Prothrombin Time: 14.4 seconds (ref 11.4–15.2)

## 2016-03-27 LAB — CBC
HCT: 42.5 % (ref 40.0–52.0)
HCT: 43.9 % (ref 40.0–52.0)
HEMOGLOBIN: 14 g/dL (ref 13.0–18.0)
HEMOGLOBIN: 14.7 g/dL (ref 13.0–18.0)
MCH: 30.6 pg (ref 26.0–34.0)
MCH: 31 pg (ref 26.0–34.0)
MCHC: 33 g/dL (ref 32.0–36.0)
MCHC: 33.4 g/dL (ref 32.0–36.0)
MCV: 92.8 fL (ref 80.0–100.0)
MCV: 92.9 fL (ref 80.0–100.0)
PLATELETS: 170 10*3/uL (ref 150–440)
Platelets: 149 10*3/uL — ABNORMAL LOW (ref 150–440)
RBC: 4.58 MIL/uL (ref 4.40–5.90)
RBC: 4.72 MIL/uL (ref 4.40–5.90)
RDW: 15.7 % — AB (ref 11.5–14.5)
RDW: 16 % — ABNORMAL HIGH (ref 11.5–14.5)
WBC: 7.2 10*3/uL (ref 3.8–10.6)
WBC: 8 10*3/uL (ref 3.8–10.6)

## 2016-03-27 LAB — DIFFERENTIAL
BASOS ABS: 0 10*3/uL (ref 0–0.1)
BASOS PCT: 1 %
EOS ABS: 0.1 10*3/uL (ref 0–0.7)
Eosinophils Relative: 2 %
LYMPHS ABS: 1.3 10*3/uL (ref 1.0–3.6)
Lymphocytes Relative: 17 %
Monocytes Absolute: 0.7 10*3/uL (ref 0.2–1.0)
Monocytes Relative: 9 %
NEUTROS ABS: 5.1 10*3/uL (ref 1.4–6.5)
NEUTROS PCT: 71 %

## 2016-03-27 LAB — TROPONIN I
TROPONIN I: 0.06 ng/mL — AB (ref <0.03)
TROPONIN I: 0.06 ng/mL — AB (ref <0.03)
Troponin I: 0.06 ng/mL (ref <0.03)

## 2016-03-27 LAB — GLUCOSE, CAPILLARY
Glucose-Capillary: 91 mg/dL (ref 65–99)
Glucose-Capillary: 115 mg/dL — ABNORMAL HIGH (ref 65–99)

## 2016-03-27 LAB — CREATININE, SERUM
CREATININE: 1.51 mg/dL — AB (ref 0.61–1.24)
GFR calc Af Amer: >60 mL/min (ref >60)
GFR, EST NON AFRICAN AMERICAN: 53 mL/min — AB (ref >60)

## 2016-03-27 LAB — APTT: APTT: 29 s (ref 24–36)

## 2016-03-27 MED ORDER — ACETAMINOPHEN 325 MG PO TABS
650.0000 mg | ORAL_TABLET | Freq: Four times a day (QID) | ORAL | Status: DC | PRN
Start: 2016-03-27 — End: 2016-03-28
  Administered 2016-03-27: 650 mg via ORAL
  Filled 2016-03-27: qty 2

## 2016-03-27 MED ORDER — ASPIRIN 81 MG PO CHEW: 324.0000 mg | CHEWABLE_TABLET | Freq: Once | ORAL | Status: DC

## 2016-03-27 MED ORDER — ONDANSETRON HCL 4 MG PO TABS
4.0000 mg | ORAL_TABLET | Freq: Four times a day (QID) | ORAL | Status: DC | PRN
Start: 2016-03-27 — End: 2016-03-28

## 2016-03-27 MED ORDER — PRAVASTATIN SODIUM 40 MG PO TABS
80.0000 mg | ORAL_TABLET | Freq: Every day | ORAL | Status: DC
  Administered 2016-03-27 – 2016-03-28 (×2): 80 mg via ORAL
  Filled 2016-03-27 (×2): qty 2

## 2016-03-27 MED ORDER — DOCUSATE SODIUM 100 MG PO CAPS
100.0000 mg | ORAL_CAPSULE | Freq: Two times a day (BID) | ORAL | Status: DC
  Administered 2016-03-27 – 2016-03-28 (×2): 100 mg via ORAL
  Filled 2016-03-27 (×2): qty 1

## 2016-03-27 MED ORDER — NITROGLYCERIN 0.4 MG SL SUBL: 0.4000 mg | SUBLINGUAL_TABLET | SUBLINGUAL | Status: DC | PRN

## 2016-03-27 MED ORDER — GLIPIZIDE 2.5 MG HALF TABLET
2.5000 mg | ORAL_TABLET | Freq: Every day | ORAL | Status: DC
  Administered 2016-03-28: 2.5 mg via ORAL
  Filled 2016-03-27: qty 1

## 2016-03-27 MED ORDER — ALBUTEROL SULFATE (2.5 MG/3ML) 0.083% IN NEBU: 2.5000 mg | INHALATION_SOLUTION | Freq: Four times a day (QID) | RESPIRATORY_TRACT | Status: DC | PRN

## 2016-03-27 MED ORDER — CLOPIDOGREL BISULFATE 75 MG PO TABS
75.0000 mg | ORAL_TABLET | ORAL | Status: DC
  Administered 2016-03-28: 75 mg via ORAL
  Filled 2016-03-27 (×2): qty 1

## 2016-03-27 MED ORDER — ONDANSETRON HCL 4 MG/2ML IJ SOLN: 4.0000 mg | Freq: Four times a day (QID) | INTRAMUSCULAR | Status: DC | PRN

## 2016-03-27 MED ORDER — ENOXAPARIN SODIUM 40 MG/0.4ML ~~LOC~~ SOLN
40.0000 mg | Freq: Two times a day (BID) | SUBCUTANEOUS | Status: DC
  Administered 2016-03-27 – 2016-03-28 (×2): 40 mg via SUBCUTANEOUS
  Filled 2016-03-27 (×2): qty 0.4

## 2016-03-27 MED ORDER — ASPIRIN 81 MG PO CHEW: 81.0000 mg | CHEWABLE_TABLET | Freq: Once | ORAL | Status: DC

## 2016-03-27 MED ORDER — ACETAMINOPHEN 650 MG RE SUPP: 650.0000 mg | Freq: Four times a day (QID) | RECTAL | Status: DC | PRN

## 2016-03-27 MED ORDER — FUROSEMIDE 10 MG/ML IJ SOLN
40.0000 mg | Freq: Two times a day (BID) | INTRAMUSCULAR | Status: DC
  Administered 2016-03-27 – 2016-03-28 (×2): 40 mg via INTRAVENOUS
  Filled 2016-03-27 (×3): qty 4

## 2016-03-27 MED ORDER — HYDROCODONE-ACETAMINOPHEN 5-325 MG PO TABS: 1.0000 | ORAL_TABLET | ORAL | Status: DC | PRN

## 2016-03-27 MED ORDER — BISACODYL 5 MG PO TBEC: 5.0000 mg | DELAYED_RELEASE_TABLET | Freq: Every day | ORAL | Status: DC | PRN

## 2016-03-27 MED ORDER — ENALAPRIL MALEATE 2.5 MG PO TABS
2.5000 mg | ORAL_TABLET | Freq: Every day | ORAL | Status: DC
  Administered 2016-03-28: 2.5 mg via ORAL
  Filled 2016-03-27 (×2): qty 1

## 2016-03-27 MED ORDER — FUROSEMIDE 10 MG/ML IJ SOLN
60.0000 mg | Freq: Once | INTRAMUSCULAR | Status: AC
  Administered 2016-03-27: 60 mg via INTRAVENOUS
  Filled 2016-03-27: qty 8

## 2016-03-27 MED ORDER — INSULIN ASPART 100 UNIT/ML ~~LOC~~ SOLN
0.0000 [IU] | Freq: Three times a day (TID) | SUBCUTANEOUS | Status: DC
  Filled 2016-03-27: qty 3

## 2016-03-27 MED ORDER — NITROGLYCERIN 2 % TD OINT
0.5000 [in_us] | TOPICAL_OINTMENT | Freq: Four times a day (QID) | TRANSDERMAL | Status: DC
  Administered 2016-03-27: 0.5 [in_us] via TOPICAL
  Filled 2016-03-27: qty 1

## 2016-03-27 MED ORDER — METOPROLOL SUCCINATE ER 50 MG PO TB24
25.0000 mg | ORAL_TABLET | ORAL | Status: DC
  Administered 2016-03-28: 25 mg via ORAL
  Filled 2016-03-27 (×2): qty 1

## 2016-03-27 MED ORDER — FAMOTIDINE 20 MG PO TABS
10.0000 mg | ORAL_TABLET | Freq: Every day | ORAL | Status: DC
  Administered 2016-03-28: 10 mg via ORAL
  Filled 2016-03-27: qty 1

## 2016-03-27 MED ORDER — SPIRONOLACTONE 12.5 MG HALF TABLET
12.5000 mg | ORAL_TABLET | Freq: Every day | ORAL | Status: DC
  Administered 2016-03-28: 12.5 mg via ORAL
  Filled 2016-03-27 (×3): qty 1

## 2016-03-27 NOTE — ED Notes (Signed)
Report to Jancy, RN  

## 2016-03-27 NOTE — ED Notes (Signed)
MD aware of elevated troponin level. Verbally told MD, MD acknowledged.

## 2016-03-27 NOTE — ED Provider Notes (Signed)
[facility] Emergency Department Provider Note    First MD Initiated Contact with Patient 03/27/16 1505     (approximate)  I have reviewed the triage vital signs and the nursing notes.   HISTORY  Chief Complaint No chief complaint on file.    HPI Kent Dunnaway. is a 45 y.o. male significant congestive heart failure and chronic kidney disease presents with 1 week of intermittent chest pain and worsening shortness of breath associated with 5-10 pound weight gain and lower extremity edema. Patient states he is having worsening orthopnea despite outpatient diuretics and blood pressure management. States the chest pain is midsternal radiating to his left shoulder and lasted anywhere between 2-3 minutes. There is no associated diaphoresis or nausea. Patient states that the chest pain can occur while at rest or during exertion. Denies any recent fevers or cough. She still takes daily Plavix but is not taking aspirin. No longer taking nitroglycerin. Patient has a history of angina but states this feels different due to the duration of symptoms and the weight gain.   Past Medical History:  Diagnosis Date  . CHF (congestive heart failure) ([place])   . Chronic systolic heart failure ([place]) April of 2014   Ejection fraction less than 20%  . CKD (chronic kidney disease), stage II   . History of traumatic injury of head   . Hyperlipidemia   . Hypertension   . Irregular heart beat   . Morbid obesity ([place])   . Seizure disorder ([place])   . Stroke [facility])     Patient Active Problem List   Diagnosis Date Noted  . Stroke (cerebrum) ([place]) 08/12/2015  . Epididymitis 12/25/2014  . Musculoskeletal pain 10/31/2014  . Erectile dysfunction 10/31/2014  . Sleep apnea 11/05/2013  . Chronic systolic heart failure ([place])   . Hyperlipidemia   . Hypertension   . New onset type 2 diabetes mellitus ([place]) 01/27/2013  . HLD (hyperlipidemia) 01/27/2013  . Adiposity 01/27/2013  .  Cerebral seizure ([place]) 01/27/2013  . Cerebrovascular accident, old 01/27/2013    Past Surgical History:  Procedure Laterality Date  . CARDIAC CATHETERIZATION     [facility]    Prior to Admission medications   Medication Sig Start Date End Date Taking? Authorizing Provider  albuterol (PROVENTIL HFA;VENTOLIN HFA) 108 (90 Base) MCG/ACT inhaler Inhale 2 puffs into the lungs every 6 (six) hours as needed for wheezing or shortness of breath. 02/12/16   Amy Overton Mam, NP  blood glucose meter kit and supplies KIT Dispense based on patient and insurance preference. Check blood glucose once daily. 08/21/15   Amy Overton Mam, NP  clopidogrel (PLAVIX) 75 MG tablet Take 1 tablet (75 mg total) by mouth daily. 02/12/16   Amy Overton Mam, NP  dextromethorphan-guaiFENesin (MUCINEX DM) 30-600 MG 12hr tablet Take 1 tablet by mouth 2 (two) times daily. 02/12/16   [name] [name], NP  enalapril (VASOTEC) 2.5 MG tablet Take 1 tablet (2.5 mg total) by mouth daily. 02/12/16   Amy Overton Mam, NP  furosemide (LASIX) 40 MG tablet TAKE ONE TABLET BY MOUTH TWICE DAILY 07/03/15   Wellington Hampshire, MD  glipiZIDE (GLUCOTROL) 5 MG tablet Take 0.5 tablets (2.5 mg total) by mouth daily before breakfast. 09/12/15   Amy Overton Mam, NP  metoprolol succinate (TOPROL-XL) 25 MG 24 hr tablet Take 25 mg by mouth daily.    Historical Provider, MD  pravastatin (PRAVACHOL) 80 MG tablet Take 1 tablet (80 mg total) by mouth daily at  6 PM. 09/12/15   Amy Overton Mam, NP  ranitidine (ZANTAC) 150 MG capsule Take 1 capsule (150 mg total) by mouth 2 (two) times daily. 02/15/16   Amy Overton Mam, NP  sildenafil (VIAGRA) 25 MG tablet Take 1 tablet (25 mg total) by mouth daily as needed for erectile dysfunction. 10/31/14   Wellington Hampshire, MD    Allergies Tomato  Family History  Problem Relation Age of Onset  . Diabetes Mother   . Brain cancer Mother   . Heart attack Mother   . Hypertension Father   . Diabetes Father   . Heart disease Father     . Hyperlipidemia Father   . Heart attack Father   . Heart failure Father     Social History Social History  Substance Use Topics  . Smoking status: Former Smoker    Years: 10.00    Types: Cigarettes    Quit date: 08/07/2015  . Smokeless tobacco: Not on file  . Alcohol use Yes     Comment: socially    Review of Systems Patient denies headaches, rhinorrhea, blurry vision, numbness, shortness of breath, chest pain, edema, cough, abdominal pain, nausea, vomiting, diarrhea, dysuria, fevers, rashes or hallucinations unless otherwise stated above in HPI. ____________________________________________   PHYSICAL EXAM:  VITAL SIGNS: Vitals:   03/27/16 1800 03/27/16 1919  BP: (!) 149/96 108/72  Pulse: 96 89  Resp: (!) 21 20  Temp: 97.7 F (36.5 C) 97.8 F (36.6 C)    Constitutional: Alert and oriented. Well appearing and in no acute distress. Eyes: Conjunctivae are normal. PERRL. EOMI. Head: Atraumatic. Nose: No congestion/rhinnorhea. Mouth/Throat: Mucous membranes are moist.  Oropharynx non-erythematous. Neck: No stridor. Painless ROM. No cervical spine tenderness to palpation Hematological/Lymphatic/Immunilogical: No cervical lymphadenopathy. Cardiovascular: Normal rate, regular rhythm. Grossly normal heart sounds.  Good peripheral circulation. Respiratory: Normal respiratory effort.  No retractions. Bilateral posterior crackles. Gastrointestinal: Soft and nontender. No distention. No abdominal bruits. No CVA tenderness. Genitourinary:  Musculoskeletal: No lower extremity tenderness, 2+ edema.  No joint effusions. Neurologic:  Normal speech and language. No gross focal neurologic deficits are appreciated. No gait instability. Skin:  Skin is warm, dry and intact. No rash noted. Psychiatric: Mood and affect are normal. Speech and behavior are normal. ________________________________________   LABS (all labs ordered are listed, but only abnormal results are  displayed)  Results for orders placed or performed during the hospital encounter of 03/27/16 (from the past 24 hour(s))  CBC     Status: Abnormal   Collection Time: 03/27/16  3:10 PM  Result Value Ref Range   WBC 7.2 3.8 - 10.6 K/uL   RBC 4.58 4.40 - 5.90 MIL/uL   Hemoglobin 14.0 13.0 - 18.0 g/dL   HCT 42.5 40.0 - 52.0 %   MCV 92.8 80.0 - 100.0 fL   MCH 30.6 26.0 - 34.0 pg   MCHC 33.0 32.0 - 36.0 g/dL   RDW 15.7 (H) 11.5 - 14.5 %   Platelets 149 (L) 150 - 440 K/uL  Differential     Status: None   Collection Time: 03/27/16  3:10 PM  Result Value Ref Range   Neutrophils Relative % 71 %   Neutro Abs 5.1 1.4 - 6.5 K/uL   Lymphocytes Relative 17 %   Lymphs Abs 1.3 1.0 - 3.6 K/uL   Monocytes Relative 9 %   Monocytes Absolute 0.7 0.2 - 1.0 K/uL   Eosinophils Relative 2 %   Eosinophils Absolute 0.1 0 - 0.7 K/uL  Basophils Relative 1 %   Basophils Absolute 0.0 0 - 0.1 K/uL  Protime-INR     Status: None   Collection Time: 03/27/16  3:10 PM  Result Value Ref Range   Prothrombin Time 14.4 11.4 - 15.2 seconds   INR 1.11   APTT     Status: None   Collection Time: 03/27/16  3:10 PM  Result Value Ref Range   aPTT 29 24 - 36 seconds  Comprehensive metabolic panel     Status: Abnormal   Collection Time: 03/27/16  3:10 PM  Result Value Ref Range   Sodium 139 135 - 145 mmol/L   Potassium 3.7 3.5 - 5.1 mmol/L   Chloride 107 101 - 111 mmol/L   CO2 28 22 - 32 mmol/L   Glucose, Bld 141 (H) 65 - 99 mg/dL   BUN 16 6 - 20 mg/dL   Creatinine, Ser 1.42 (H) 0.61 - 1.24 mg/dL   Calcium 8.8 (L) 8.9 - 10.3 mg/dL   Total Protein 7.4 6.5 - 8.1 g/dL   Albumin 3.6 3.5 - 5.0 g/dL   AST 25 15 - 41 U/L   ALT 24 17 - 63 U/L   Alkaline Phosphatase 48 38 - 126 U/L   Total Bilirubin 0.7 0.3 - 1.2 mg/dL   GFR calc non Af Amer 58 (L) >60 mL/min   GFR calc Af Amer >60 >60 mL/min   Anion gap 4 (L) 5 - 15  Troponin I     Status: Abnormal   Collection Time: 03/27/16  3:10 PM  Result Value Ref Range    Troponin I 0.06 (HH) <0.03 ng/mL   ____________________________________________  EKG My review and personal interpretation at Time: 15:04   Indication: chest pain  Rate: 90  Rhythm: normal sines Axis: normal Other: T wave inversions laterally, pseudonormalization in inferior leads ____________________________________________  RADIOLOGY  CXR with I nterval worsening of pulmonary edema and cardiomegaly ____________________________________________   PROCEDURES  Procedure(s) performed: none    Critical Care performed: no ____________________________________________   INITIAL IMPRESSION / ASSESSMENT AND PLAN / ED COURSE  Pertinent labs & imaging results that were available during my care of the patient were reviewed by me and considered in my medical decision making (see chart for details).  DDX: ACS, pericarditis, esophagitis, boerhaaves, pe, dissection, pna, bronchitis, costochondritis   Sultan Pargas. is a 45 y.o. who presents to the ED with complaint of intermittent chest pain for the past several days, weight gain and worsening orthopnea. Patient does arrive in moderate respiratory distress with tachypnea but no hypoxia. Based on his previous history and concern for ACS versus worsening of his congestive heart failure. We'll order chest x-ray to evaluate.  The patient will be placed on continuous pulse oximetry and telemetry for monitoring.  Laboratory evaluation will be sent to evaluate for the above complaints.     Clinical Course  Comment By Time  Spoke with Dr. Fletcher [name] " the patient's presentation including EKG changes and mild troponin elevation with worsening anginal symptoms. As he had a negative catheter 1 year ago he's currently chest pain-free do not feel heparinization currently indicated. Chest x-ray does show significant edema and given his worsening symptoms do feel several doses of IV Lasix and hemodynamic monitoring clinically indicated. Merlyn Lot,  MD 09/21 1627     ____________________________________________   FINAL CLINICAL IMPRESSION(S) / ED DIAGNOSES  Final diagnoses:  Dyspnea  Chest pain, unspecified chest pain type  Acute pulmonary edema ([place])  NEW MEDICATIONS STARTED DURING THIS VISIT:  New Prescriptions   No medications on file     Note:  This document was prepared using Dragon voice recognition software and may include unintentional dictation errors.    Merlyn Lot, MD 03/27/16 2053

## 2016-03-27 NOTE — Progress Notes (Addendum)
Subjective:    Patient ID: Kent Kindred., male    DOB: [birth year], 45 y.o.   MRN: [id number]  HPI: Kent Stanley. is a 45 y.o. male presenting on 03/27/2016 for Hypertension   HPI  Pt presents hypertension follow-up.   He did not go to cardiology- he did have the co-pay. He called EMS- was told he was not having a heart attack they wanted to take him to the hospital but he declined. Chest gets tight- L sided chest pain. Chest pain has been present for 5 days. No chest pain at this time. Sharp pains on the L side the chest. Shortness of breath with any exertion. Shortness of breath at rest. Can't sleep unless he is sitting up in the recliner. Got bad over the past 2 mos when his lasix was stopped. Gets dizzy after he walks. He has swelling in his feet and some in his abdomen. He feels like his abdomen is getting big and tight. He reports it feels larger each day.  He did not get his inhaler due to cost. Weighs himself every other day. Has gained 4lbs in the past month. Was eating to be eating.  95% on RA- dips to 92% with exertion. HR up to 120 with exertion. Unclear what his last dry weight was.   Past Medical History:  Diagnosis Date  . CHF (congestive heart failure) ([place])   . Chronic systolic heart failure ([place]) April of 2014   Ejection fraction less than 20%  . CKD (chronic kidney disease), stage II   . History of traumatic injury of head   . Hyperlipidemia   . Hypertension   . Irregular heart beat   . Morbid obesity ([place])   . Seizure disorder ([place])   . Stroke [facility])     No current facility-administered medications on file prior to visit.    Current Outpatient Prescriptions on File Prior to Visit  Medication Sig  . albuterol (PROVENTIL HFA;VENTOLIN HFA) 108 (90 Base) MCG/ACT inhaler Inhale 2 puffs into the lungs every 6 (six) hours as needed for wheezing or shortness of breath.  . blood glucose meter kit and supplies KIT Dispense based on patient and insurance  preference. Check blood glucose once daily.  . clopidogrel (PLAVIX) 75 MG tablet Take 1 tablet (75 mg total) by mouth daily.  [name] dextromethorphan-guaiFENesin (MUCINEX DM) 30-600 MG 12hr tablet Take 1 tablet by mouth 2 (two) times daily.  . enalapril (VASOTEC) 2.5 MG tablet Take 1 tablet (2.5 mg total) by mouth daily.  . furosemide (LASIX) 40 MG tablet TAKE ONE TABLET BY MOUTH TWICE DAILY  . glipiZIDE (GLUCOTROL) 5 MG tablet Take 0.5 tablets (2.5 mg total) by mouth daily before breakfast.  . metoprolol succinate (TOPROL-XL) 25 MG 24 hr tablet Take 25 mg by mouth daily.  . pravastatin (PRAVACHOL) 80 MG tablet Take 1 tablet (80 mg total) by mouth daily at 6 PM.  . ranitidine (ZANTAC) 150 MG capsule Take 1 capsule (150 mg total) by mouth 2 (two) times daily.  . sildenafil (VIAGRA) 25 MG tablet Take 1 tablet (25 mg total) by mouth daily as needed for erectile dysfunction.    Review of Systems  Constitutional: Negative for chills and fever.  HENT: Negative.  Negative for sore throat and trouble swallowing.   Eyes: Negative for photophobia and visual disturbance.  Respiratory: Positive for chest tightness and shortness of breath. Negative for wheezing.   Cardiovascular: Positive for chest pain and leg swelling. Negative  for palpitations.  Gastrointestinal: Negative for abdominal pain, nausea and vomiting.       Noticed abdominal swelling.   Endocrine: Negative.  Negative for polydipsia, polyphagia and polyuria.  Genitourinary: Negative for discharge, dysuria, penile pain, testicular pain and urgency.  Musculoskeletal: Negative for arthralgias, back pain, joint swelling and neck stiffness.  Skin: Negative.   Neurological: Negative for dizziness, weakness, numbness and headaches.  Hematological: Negative for adenopathy.  Psychiatric/Behavioral: Negative for agitation, dysphoric mood and sleep disturbance.   Per HPI unless specifically indicated above     Objective:    BP 138/82   Pulse (!)  105   Temp 98.8 F (37.1 C) (Oral)   Resp (!) 26   Ht 6' (1.829 m)   Wt (!) 336 lb 6.4 oz (152.6 kg)   SpO2 97%   BMI 45.62 kg/m   Wt Readings from Last 3 Encounters:  03/27/16 (!) 336 lb 6.4 oz (152.6 kg)  02/12/16 (!) 332 lb 9.6 oz (150.9 kg)  08/21/15 (!) 314 lb 6.4 oz (142.6 kg)    Physical Exam  Constitutional: He is oriented to person, place, and time. He appears well-developed and well-nourished. No distress.  HENT:  Head: Normocephalic and atraumatic.  Neck: Neck supple. No thyromegaly present.  Cardiovascular: Regular rhythm, S2 normal and normal heart sounds.  Tachycardia present.  Exam reveals no gallop and no friction rub.   No murmur heard. Pulses:      Radial pulses are 2+ on the right side, and 2+ on the left side.       Dorsalis pedis pulses are 1+ on the right side, and 1+ on the left side.  Pulmonary/Chest: Effort normal. Tachypnea noted. He has decreased breath sounds in the right lower field and the left lower field. He has no wheezes. He has no rhonchi. He has no rales.  Abdominal: Soft. Normal appearance and bowel sounds are normal. He exhibits no distension, no pulsatile liver, no fluid wave and no abdominal bruit. There is no tenderness. There is no rebound.  Abdomen tight on exam, still soft.   Musculoskeletal: Normal range of motion. He exhibits no edema or tenderness.  Neurological: He is alert and oriented to person, place, and time. He has normal reflexes.  Skin: Skin is warm and dry. No rash noted. No erythema.  Psychiatric: He has a normal mood and affect. His behavior is normal. Thought content normal.   Results for orders placed or performed in visit on 02/12/16  COMPLETE METABOLIC PANEL WITH GFR  Result Value Ref Range   Sodium 144 135 - 146 mmol/L   Potassium 4.5 3.5 - 5.3 mmol/L   Chloride 104 98 - 110 mmol/L   CO2 28 20 - 31 mmol/L   Glucose, Bld 99 65 - 99 mg/dL   BUN 17 7 - 25 mg/dL   Creat 9.67 (H) 9.80 - 1.35 mg/dL   Total Bilirubin  0.7 0.2 - 1.2 mg/dL   Alkaline Phosphatase 47 40 - 115 U/L   AST 27 10 - 40 U/L   ALT 27 9 - 46 U/L   Total Protein 6.5 6.1 - 8.1 g/dL   Albumin 3.7 3.6 - 5.1 g/dL   Calcium 9.2 8.6 - 91.3 mg/dL   GFR, Est African American 58 (L) >=60 mL/min   GFR, Est Non African American 50 (L) >=60 mL/min  Lipid Profile  Result Value Ref Range   Cholesterol 127 125 - 200 mg/dL   Triglycerides 839 <197 mg/dL   HDL  28 (L) >=40 mg/dL   Total CHOL/HDL Ratio 4.5 <=5.0 Ratio   VLDL 22 <30 mg/dL   LDL Cholesterol 77 <130 mg/dL  B Nat Peptide  Result Value Ref Range   Brain Natriuretic Peptide 403.8 (H) <100 pg/mL  POCT HgB A1C  Result Value Ref Range   Hemoglobin A1C 7.1   POCT UA - Microalbumin  Result Value Ref Range   Microalbumin Ur, POC 0 mg/L   Creatinine, POC  mg/dL   Albumin/Creatinine Ratio, Urine, POC        Assessment & Plan:   Problem List Items Addressed This Visit      Cardiovascular and Mediastinum   Chronic systolic heart failure (HCC) - Primary    Likely CHF exacerbation today.  SOB with exertion. Tight belly. Very tachypneic and cannot talk when exerted. Pt has not seen cardiology in some time. Last known EF 20%. ECG no significant changes from previous- LVH, q waves. No ST changes noted.  EMS evaluated pt in the office- sent to [facility] for evaluation of chest pain, SOB, and possible CHF exacerbation.  Aspirin given in office.  I spent 35 minutes at the bedside face to face with this patient taking vital signs, coordinating care, administering emergency medications.       Relevant Medications   aspirin chewable tablet 81 mg   Other Relevant Orders   EKG 12-Lead   Hypertension   Relevant Medications   aspirin chewable tablet 81 mg    Other Visit Diagnoses    Chest pain at rest       ECG no changes from previous. Likely 2/2 CHF EMS called for SOB and chest pain on exertion in the office. Transported to [facility], report called to [facility].    Relevant Medications    aspirin chewable tablet 81 mg   Other Relevant Orders   EKG 12-Lead   Shortness of breath       Pulm process vs. CHF. Sat 92% on exertion. Very tachypneic. Transferred to [facility] ER via EMS for shortness of breath and chest pain.    Relevant Orders   EKG 12-Lead      Meds ordered this encounter  Medications  . aspirin chewable tablet 81 mg      Follow up plan: Return in about 2 weeks (around 04/10/2016), or if symptoms worsen or fail to improve.

## 2016-03-27 NOTE — ED Notes (Signed)
Pt given drink and diet tray.

## 2016-03-27 NOTE — H&P (Signed)
[facility] [place] [facility]   PATIENT NAME: Kent Stanley    MR#:  [id number]  DATE OF BIRTH:  14-Jun-1971  DATE OF ADMISSION:  03/27/2016  PRIMARY CARE PHYSICIAN: Filbert Berthold, NP   REQUESTING/REFERRING PHYSICIAN:DR.PatrickRobinson  CHIEF COMPLAINT:   Chief Complaint  Patient presents with  . Chest Pain    HISTORY OF PRESENT ILLNESS:  Kent Stanley  is a 45 y.o. male with a known history of Hypertension,chronic systolic heart failure,DMII came in for SOB,left sided chest pain. Patient has been having left-sided chest pain for almost 1 week. Patient called EMS last week, they said EKG was normal and so than left. But today patient went to see primary doctor because of, left-sided chest pain, shortness of breath, orthopnea, PND. Patient has a weight gain of 12 pounds in the last 2 months. Patient also complained of dizziness when he walks. Patient O2 saturations are 92-92% with exertion, he also became tachycardic with heart rate of 120 beats so he was sent to the emergency room.  PAST MEDICAL HISTORY:   Past Medical History:  Diagnosis Date  . Chronic systolic heart failure [facility]) April of 2014   a. EF less than 20%  . CKD (chronic kidney disease), stage II   . Coronary artery disease, non-occlusive    a. cath 09/2014 showed minor luminal irregularities with severely dilated left ventricle. LVEDP 30  . H/O noncompliance with medical treatment, presenting hazards to health   . History of traumatic injury of head   . Hyperlipidemia   . Hypertension   . Morbid obesity (HCC)   . Polysubstance abuse    a. cocaine, tobacco, THC, ETOH  . Seizure disorder (HCC)   . Stroke [facility])     PAST SURGICAL HISTOIRY:   Past Surgical History:  Procedure Laterality Date  . CARDIAC CATHETERIZATION     [facility]    SOCIAL HISTORY:   Social History  Substance Use Topics  . Smoking status: Former Smoker    Years: 10.00    Types: Cigarettes    Quit date:  08/07/2015  . Smokeless tobacco: Never Used  . Alcohol use Yes     Comment: socially    FAMILY HISTORY:   Family History  Problem Relation Age of Onset  . Diabetes Mother   . Brain cancer Mother   . Heart attack Mother   . Hypertension Father   . Diabetes Father   . Heart disease Father   . Hyperlipidemia Father   . Heart attack Father   . Heart failure Father     DRUG ALLERGIES:   Allergies  Allergen Reactions  . Tomato Rash    REVIEW OF SYSTEMS:  CONSTITUTIONAL: No fever, fatigue or weakness.  EYES: No blurred or double vision.  EARS, NOSE, AND THROAT: No tinnitus or ear pain.  RESPIRATORY:Cough, shortness of breath, orthopnea, PND.  CARDIOVASCULAR: l sided chest pain going to the left arm.[name]  GASTROINTESTINAL: No nausea, vomiting, diarrhea or abdominal pain.  GENITOURINARY: No dysuria, hematuria.  ENDOCRINE: No polyuria, nocturia,  HEMATOLOGY: No anemia, easy bruising or bleeding SKIN: No rash or lesion. MUSCULOSKELETAL: No joint pain or arthritis.   NEUROLOGIC: No tingling, numbness, weakness.  PSYCHIATRY: No anxiety or depression.   MEDICATIONS AT HOME:   Prior to Admission medications   Medication Sig Start Date End Date Taking? Authorizing Provider  albuterol (PROVENTIL HFA;VENTOLIN HFA) 108 (90 Base) MCG/ACT inhaler Inhale 2 puffs into the lungs every 6 (six) hours as needed  for wheezing or shortness of breath. 02/12/16  Yes Amy Overton Mam, NP  blood glucose meter kit and supplies KIT Dispense based on patient and insurance preference. Check blood glucose once daily. 08/21/15  Yes Amy Overton Mam, NP  clopidogrel (PLAVIX) 75 MG tablet Take 1 tablet (75 mg total) by mouth daily. Patient taking differently: Take 75 mg by mouth every morning.  02/12/16  Yes Amy Overton Mam, NP  enalapril (VASOTEC) 2.5 MG tablet Take 1 tablet (2.5 mg total) by mouth daily. Patient taking differently: Take 2.5 mg by mouth every morning.  02/12/16  Yes Amy Overton Mam, NP   furosemide (LASIX) 40 MG tablet TAKE ONE TABLET BY MOUTH TWICE DAILY 07/03/15  Yes Wellington Hampshire, MD  glipiZIDE (GLUCOTROL) 5 MG tablet Take 0.5 tablets (2.5 mg total) by mouth daily before breakfast. 09/12/15  Yes Amy Overton Mam, NP  metoprolol succinate (TOPROL-XL) 25 MG 24 hr tablet Take 25 mg by mouth every morning.    Yes Historical Provider, MD  pravastatin (PRAVACHOL) 80 MG tablet Take 1 tablet (80 mg total) by mouth daily at 6 PM. 09/12/15  Yes Amy Overton Mam, NP  sildenafil (VIAGRA) 25 MG tablet Take 1 tablet (25 mg total) by mouth daily as needed for erectile dysfunction. 10/31/14  Yes Wellington Hampshire, MD  dextromethorphan-guaiFENesin (MUCINEX DM) 30-600 MG 12hr tablet Take 1 tablet by mouth 2 (two) times daily. Patient not taking: Reported on 03/27/2016 02/12/16   Amy Overton Mam, NP  ranitidine (ZANTAC) 150 MG capsule Take 1 capsule (150 mg total) by mouth 2 (two) times daily. Patient not taking: Reported on 03/27/2016 02/15/16   Amy Overton Mam, NP      VITAL SIGNS:  Blood pressure (!) 168/100, pulse 84, temperature 98.1 F (36.7 C), temperature source Oral, resp. rate 18, height 6' (1.829 m), weight (!) 152.4 kg (336 lb), SpO2 94 %.  PHYSICAL EXAMINATION:  GENERAL:  45 y.o.-year-old patient lying in the bed with no acute distress.  EYES: Pupils equal, round, reactive to light and accommodation. No scleral icterus. Extraocular muscles intact.  HEENT: Head atraumatic, normocephalic. Oropharynx and nasopharynx clear.  NECK:  Supple, no jugular venous distention. No thyroid enlargement, no tenderness.  LUNGS: Normal breath sounds bilaterally, no wheezing, rales,rhonchi or crepitation. No use of accessory muscles of respiration.  CARDIOVASCULAR: S1, S2 normal. No murmurs, rubs, or gallops.  ABDOMEN: Soft, nontender, nondistended. Bowel sounds present. No organomegaly or mass.  EXTREMITIES: No pedal edema, cyanosis, or clubbing.  NEUROLOGIC: Cranial nerves II through XII are  intact. Muscle strength 5/5 in all extremities. Sensation intact. Gait not checked.  PSYCHIATRIC: The patient is alert and oriented x 3.  SKIN: No obvious rash, lesion, or ulcer.   LABORATORY PANEL:   CBC  Recent Labs Lab 03/27/16 1510  WBC 7.2  HGB 14.0  HCT 42.5  PLT 149*   ------------------------------------------------------------------------------------------------------------------  Chemistries   Recent Labs Lab 03/27/16 1510  NA 139  K 3.7  CL 107  CO2 28  GLUCOSE 141*  BUN 16  CREATININE 1.42*  CALCIUM 8.8*  AST 25  ALT 24  ALKPHOS 48  BILITOT 0.7   ------------------------------------------------------------------------------------------------------------------  Cardiac Enzymes  Recent Labs Lab 03/27/16 1510  TROPONINI 0.06*   ------------------------------------------------------------------------------------------------------------------  RADIOLOGY:  Dg Chest 2 View  Result Date: 03/27/2016 CLINICAL DATA:  Shortness of breath and chest pain for 1 week, hypertension, CHF, diabetes mellitus EXAM: CHEST  2 VIEW COMPARISON:  02/12/2016 FINDINGS: Enlargement of cardiac silhouette with pulmonary  vascular congestion. Mediastinal contours normal. Lungs clear. No pleural effusion or pneumothorax. Bones unremarkable. IMPRESSION: Enlargement of cardiac silhouette with pulmonary vascular congestion. No acute infiltrate. Electronically Signed   By: Lavonia Dana M.D.   On: 03/27/2016 16:11    EKG:   Orders placed or performed during the hospital encounter of 03/27/16  . ED EKG  . ED EKG    IMPRESSION AND PLAN:   1.Acute on chronic systolic alcoholic heart failure: History of severely dilated cardio myopathy, EF 20% by previous echo in 2014, patient supposed to see Dr. Horatio Pel never followed up, patient has coming up appointment with Dr. Fletcher [name] on  October 3. The new IV Lasix, lisinopril, Aldactone. Monitor on telemetry for any arrhythmias. Check daily  weights, low sodium diet, CHF clinic follow-up.  Chest pain: Patient chest pain relieved with nitroglycerin. Troponin slightly elevated at 0.06. Cycle the troponins and 2 more times. Continue aspirin, Plavix, Lovenox. 3.hlp;statin  4. morbid obesity #5 history of previous stroke. #7 diabetes mellitus type 2, chronic kidney disease stage III: Stable. Continue glipizide, add SSI with coverage. GI, DVT prophylaxis  Discussed with the patient.  All the records are reviewed and case discussed with ED provider. Management plans discussed with the patient, family and they are in agreement.  CODE STATUS: full  TOTAL TIME TAKING CARE OF THIS PATIENT: 21mnutes.    KEpifanio LeschesM.D on 03/27/2016 at 5:33 PM  Between 7am to 6pm - Pager - [phone]  After 6pm go to [email or web address] - password [email or web address] [facility] EG[facility]nervilleHospitalists  Office  [phone] CC: Primary care physician; Amy L [name], NP  Note: This dictation was prepared with Dragon dictation along with smaller phrase technology. Any transcriptional errors that result from this process are unintentional.

## 2016-03-27 NOTE — ED Triage Notes (Addendum)
Pt at urgent care today, c/o CP when ambulating. Intermittent CP this week. Hx of angina but this feels different per patient. Pt has 324 baby ASA and 3 nitro spray with [facility]. 20g to L AC established by [facility]. Pt alert and oriented X4, active, cooperative, pt in NAD. RR even and unlabored while at rest, color WNL.  Pain free on arrival   Pt RR increase and WOB increases with exertion and movement. White frothy sputum and chest pain intermittently  X 1 week.

## 2016-03-27 NOTE — Assessment & Plan Note (Addendum)
Likely CHF exacerbation today.  SOB with exertion. Tight belly. Very tachypneic and cannot talk when exerted. Pt has not seen cardiology in some time. Last known EF 20%. ECG no significant changes from previous- LVH, q waves. No ST changes noted.  EMS evaluated pt in the office- sent to [facility] for evaluation of chest pain, SOB, and possible CHF exacerbation.  Aspirin given in office.  I spent 35 minutes at the bedside face to face with this patient taking vital signs, coordinating care, administering emergency medications.

## 2016-03-27 NOTE — ED Notes (Signed)
Pt back from x-ray.

## 2016-03-27 NOTE — ED Notes (Signed)
MD at bedside. 

## 2016-03-27 NOTE — ED Notes (Signed)
Informed RN bed ready 

## 2016-03-28 ENCOUNTER — Other Ambulatory Visit: Payer: Self-pay | Admitting: Family Medicine

## 2016-03-28 ENCOUNTER — Encounter: Payer: Self-pay | Admitting: Nurse Practitioner

## 2016-03-28 ENCOUNTER — Observation Stay (HOSPITAL_BASED_OUTPATIENT_CLINIC_OR_DEPARTMENT_OTHER)
Admit: 2016-03-28 | Discharge: 2016-03-28 | Disposition: A | Discharge status: Home / Self Care | Payer: Medicaid Other | Attending: Internal Medicine | Admitting: Internal Medicine

## 2016-03-28 DIAGNOSIS — G40909 Epilepsy, unspecified, not intractable, without status epilepticus: Secondary | ICD-10-CM

## 2016-03-28 DIAGNOSIS — Z6841 Body Mass Index (BMI) 40.0 and over, adult: Secondary | ICD-10-CM

## 2016-03-28 DIAGNOSIS — F1910 Other psychoactive substance abuse, uncomplicated: Secondary | ICD-10-CM | POA: Diagnosis present

## 2016-03-28 DIAGNOSIS — I5023 Acute on chronic systolic (congestive) heart failure: Secondary | ICD-10-CM

## 2016-03-28 DIAGNOSIS — I5021 Acute systolic (congestive) heart failure: Secondary | ICD-10-CM | POA: Diagnosis not present

## 2016-03-28 DIAGNOSIS — R0789 Other chest pain: Secondary | ICD-10-CM

## 2016-03-28 DIAGNOSIS — R7989 Other specified abnormal findings of blood chemistry: Secondary | ICD-10-CM

## 2016-03-28 DIAGNOSIS — I428 Other cardiomyopathies: Secondary | ICD-10-CM

## 2016-03-28 LAB — ECHOCARDIOGRAM COMPLETE
HEIGHTINCHES: 72 in
Weight: 5208 oz

## 2016-03-28 LAB — BASIC METABOLIC PANEL
ANION GAP: 6 (ref 5–15)
BUN: 20 mg/dL (ref 6–20)
CALCIUM: 8.9 mg/dL (ref 8.9–10.3)
CHLORIDE: 104 mmol/L (ref 101–111)
CO2: 29 mmol/L (ref 22–32)
CREATININE: 1.55 mg/dL — AB (ref 0.61–1.24)
GFR calc non Af Amer: 52 mL/min — ABNORMAL LOW (ref >60)
GFR, EST AFRICAN AMERICAN: 60 mL/min — AB (ref >60)
Glucose, Bld: 136 mg/dL — ABNORMAL HIGH (ref 65–99)
Potassium: 3.8 mmol/L (ref 3.5–5.1)
SODIUM: 139 mmol/L (ref 135–145)

## 2016-03-28 LAB — CBC
HCT: 44.4 % (ref 40.0–52.0)
HEMOGLOBIN: 14.8 g/dL (ref 13.0–18.0)
MCH: 31 pg (ref 26.0–34.0)
MCHC: 33.3 g/dL (ref 32.0–36.0)
MCV: 93.2 fL (ref 80.0–100.0)
PLATELETS: 152 10*3/uL (ref 150–440)
RBC: 4.76 MIL/uL (ref 4.40–5.90)
RDW: 15.7 % — ABNORMAL HIGH (ref 11.5–14.5)
WBC: 7.4 10*3/uL (ref 3.8–10.6)

## 2016-03-28 LAB — GLUCOSE, CAPILLARY
GLUCOSE-CAPILLARY: 104 mg/dL — AB (ref 65–99)
Glucose-Capillary: 190 mg/dL — ABNORMAL HIGH (ref 65–99)
Glucose-Capillary: 150 mg/dL — ABNORMAL HIGH (ref 65–99)

## 2016-03-28 LAB — TROPONIN I: TROPONIN I: 0.07 ng/mL — AB (ref <0.03)

## 2016-03-28 MED ORDER — ALBUTEROL SULFATE HFA 108 (90 BASE) MCG/ACT IN AERS: 2.0000 | INHALATION_SPRAY | Freq: Four times a day (QID) | RESPIRATORY_TRACT | 11 refills | Status: DC | PRN

## 2016-03-28 MED ORDER — SPIRONOLACTONE 25 MG PO TABS: 12.5000 mg | ORAL_TABLET | Freq: Every day | ORAL | 0 refills | Status: DC

## 2016-03-28 MED ORDER — NITROGLYCERIN 0.4 MG SL SUBL: 0.4000 mg | SUBLINGUAL_TABLET | SUBLINGUAL | 0 refills | Status: DC | PRN

## 2016-03-28 MED ORDER — FUROSEMIDE 10 MG/ML IJ SOLN
80.0000 mg | Freq: Once | INTRAMUSCULAR | Status: AC
  Administered 2016-03-28: 80 mg via INTRAVENOUS
  Filled 2016-03-28: qty 8

## 2016-03-28 NOTE — Progress Notes (Signed)
*  PRELIMINARY RESULTS* Echocardiogram 2D Echocardiogram has been performed.  Cristela Blue 03/28/2016, 2:38 PM

## 2016-03-28 NOTE — Progress Notes (Signed)
Patient discharged at this time home,instructions explained and well understood,follow up appointments made,home meds returned to patient,priscriptions given

## 2016-03-28 NOTE — Consult Note (Signed)
Cardiology Consult    Patient ID: Kent Stanley L Hodes Jr. MRN: [id number], DOB/AGE: 47/09/1970   Admit date: 03/27/2016 Date of Consult: 03/28/2016  Primary Physician: Filbert BertholdAmy L [name], NP Primary Cardiologist: Judie PetitM. Kirke CorinArida, MD  Requesting Provider: R. Wieting, MD  Patient Profile    45 y/o ? with a h/o NICM, chronic systolic CHF, HTN, HL, polysubstance abuse, cerebellar CVA 08/2015, CKD II, DM II, obesity, and noncompliance, who was admitted 9/21 with chest pain.  Past Medical History   Past Medical History:  Diagnosis Date  . Cerebellar Stroke    a. 08/2015 MRI: Acute/subacute L superior cerebellar infarct. Remote R cerebellar infarct;  b. 08/2015 Carotid U/S: normal carotids.  . Chronic systolic heart failure (HCC)    a. 10/2012 Echo: EF <20%, mod to sev dil LV, diast dysfxn, mildly dil LA/RA, mod to severe MR/TR.  . CKD (chronic kidney disease), stage II   . Coronary artery disease, non-occlusive    a. 09/2014 Cath: minor luminal irregularities with severely dilated left ventricle. LVEDP 30.  [name]. H/O noncompliance with medical treatment, presenting hazards to health   . History of traumatic injury of head   . Hyperlipidemia   . Hypertension   . Morbid obesity (HCC)   . NICM (nonischemic cardiomyopathy) (HCC)    a. 10/2012 Echo: EF <20%.  . Polysubstance abuse    a. cocaine, tobacco, THC, ETOH  . Seizure disorder [facility](HCC)     Past Surgical History:  Procedure Laterality Date  . CARDIAC CATHETERIZATION     [facility]     Allergies  Allergies  Allergen Reactions  . Tomato Rash    History of Present Illness    45 y/o ? with the above complex PMH including NICM, chronic syst CHF (EF < 20%), nonobs CAD (09/2014 cath), HTN, HL, CKD II, DM II, polysubstance abuse, Sz d/o, cerebellar stroke, and noncompliance. Cardiac hx dates back at least to 2014, at which time he was dx with NICM with an EF of <20% by echo.  He underwent cath in 09/2014 revealing relatively nl cors with only mild luminal  irregularities.  More recently, he was admitted in 08/2015 with acute/subacute L cerebellar CVA with findings of old R cerebellar CVA on MRI as well.  He was treated with plavix/statin.  No echo performed @ that time.  He was advised to f/u with cardiology as outpt but has not. He has not been seen by me in more than a year and has lost to follow-up. He reports worsening symptoms of exertional chest pain radiating to his left arm over the last week with associated shortness of breath, orthopnea, leg edema and weight gain. He reports taking his medications regularly. The patient was found to have mildly elevated troponin. He received IV furosemide and so far he has been negative almost 2 L with a 5 lbs weight drop. He feels better overall.   Inpatient Medications    . aspirin  324 mg Oral Once  . aspirin  81 mg Oral Once  . clopidogrel  75 mg Oral BH-q7a  . docusate sodium  100 mg Oral BID  . enalapril  2.5 mg Oral Daily  . enoxaparin (LOVENOX) injection  40 mg Subcutaneous Q12H  . famotidine  10 mg Oral Daily  . furosemide  40 mg Intravenous Q12H  . glipiZIDE  2.5 mg Oral QAC breakfast  . insulin aspart  0-15 Units Subcutaneous TID WC  . metoprolol succinate  25 mg Oral BH-q7a  . nitroGLYCERIN  0.5 inch Topical Q6H  . pravastatin  80 mg Oral q1800  . spironolactone  12.5 mg Oral Daily    Family History    Family History  Problem Relation Age of Onset  . Diabetes Mother   . Brain cancer Mother   . Heart attack Mother   . Hypertension Father   . Diabetes Father   . Heart disease Father   . Hyperlipidemia Father   . Heart attack Father   . Heart failure Father     Social History    Social History   Social History  . Marital status: Single    Spouse name: N/A  . Number of children: N/A  . Years of education: N/A   Occupational History  . Not on file.   Social History Main Topics  . Smoking status: Former Smoker    Years: 10.00    Types: Cigarettes    Quit date:  08/07/2015  . Smokeless tobacco: Never Used  . Alcohol use Yes     Comment: socially  . Drug use: No  . Sexual activity: Not on file   Other Topics Concern  . Not on file   Social History Narrative  . No narrative on file     Review of Systems    General:  No chills, fever, night sweats or weight changes.  Cardiovascular:  No chest pain, dyspnea on exertion, edema, orthopnea, palpitations, paroxysmal nocturnal dyspnea. Dermatological: No rash, lesions/masses Respiratory: No cough, dyspnea Urologic: No hematuria, dysuria Abdominal:   No nausea, vomiting, diarrhea, bright red blood per rectum, melena, or hematemesis Neurologic:  No visual changes, wkns, changes in mental status. All other systems reviewed and are otherwise negative except as noted above.  Physical Exam    Blood pressure (!) 121/58, pulse 88, temperature 97.6 F (36.4 C), temperature source Oral, resp. rate 16, height 6' (1.829 m), weight (!) 325 lb 8 oz (147.6 kg), SpO2 93 %.  General: Pleasant, NAD Psych: Normal affect. Neuro: Alert and oriented X 3. Moves all extremities spontaneously. HEENT: Normal  Neck: Supple without bruits or JVD. Lungs:  Resp regular and unlabored, CTA. Heart: RRR no s3, s4, or murmurs. Mild leg edema  Abdomen: Soft, non-tender, non-distended, BS + x 4.  Extremities: No clubbing, cyanosis . DP/PT/Radials 2+ and equal bilaterally.  Labs    Troponin (Point of Care Test) No results for input(s): TROPIPOC in the last 72 hours.  Recent Labs  03/27/16 1510 03/27/16 1729 03/27/16 2258 03/28/16 0409  TROPONINI 0.06* 0.06* 0.06* 0.07*   Lab Results  Component Value Date   WBC 7.4 03/28/2016   HGB 14.8 03/28/2016   HCT 44.4 03/28/2016   MCV 93.2 03/28/2016   PLT 152 03/28/2016    Recent Labs Lab 03/27/16 1510  03/28/16 0409  NA 139  --  139  K 3.7  --  3.8  CL 107  --  104  CO2 28  --  29  BUN 16  --  20  CREATININE 1.42*  < > 1.55*  CALCIUM 8.8*  --  8.9  PROT 7.4   --   --   BILITOT 0.7  --   --   ALKPHOS 48  --   --   ALT 24  --   --   AST 25  --   --   GLUCOSE 141*  --  136*  < > = values in this interval not displayed. Lab Results  Component Value Date   CHOL 127  02/12/2016   HDL 28 (L) 02/12/2016   LDLCALC 77 02/12/2016   TRIG 108 02/12/2016   No results found for: [facility]   Radiology Studies    Dg Chest 2 View  Result Date: 03/27/2016 CLINICAL DATA:  Shortness of breath and chest pain for 1 week, hypertension, CHF, diabetes mellitus EXAM: CHEST  2 VIEW COMPARISON:  02/12/2016 FINDINGS: Enlargement of cardiac silhouette with pulmonary vascular congestion. Mediastinal contours normal. Lungs clear. No pleural effusion or pneumothorax. Bones unremarkable. IMPRESSION: Enlargement of cardiac silhouette with pulmonary vascular congestion. No acute infiltrate. Electronically Signed   By: Ulyses Southward M.D.   On: 03/27/2016 16:11    ECG & Cardiac Imaging    Telemetry shows normal sinus rhythm.  EKG showed normal sinus rhythm with left ventricular hypertrophy, left atrial enlargement and repolarization abnormalities.    Assessment & Plan    1. Acute on chronic systolic heart failure: The patient was  volume overloaded but improved with IV furosemide. His weight is down 5 lbs and he feels better. He appears to be euvolemic.  He needs close follow-up with [place] in his already scheduled to see me on October 3. Continue current heart failure medications. I will consider switching metoprolol to carvedilol and switch enalapril to Entresto if blood pressure tolerates. I discussed with him the importance of close follow-up.  2. Chest pain: Mildly elevated troponin is likely due to myocardial strain from heart failure. The patient had cardiac catheterization done last year which showed no significant coronary artery disease. No need for further ischemic cardiac workup at the present time.   Signed, Lorine Bears, MD 03/28/2016, 1:45 pm

## 2016-03-28 NOTE — Discharge Instructions (Signed)
Heart Failure Clinic appointment on April 16, 2016 at 1:00pm with [name], FNP. Please call [phone] to reschedule.

## 2016-03-28 NOTE — Care Management (Signed)
Consult for CM to see due to disability.  Patient is in the final determination phase of his disability process.  He says the only thing lacking is results of an echo cardiogram.  He is having one performed during this admission and has questions about how to get the results to his lawyer.  Discussed going through medical records and either he can personally obtain the results or give his lawyer permission to request the information.  No other needs.

## 2016-03-28 NOTE — Progress Notes (Signed)
Initial Heart Failure Clinic appointment scheduled on April 16, 2016 at 1:00pm. Thank you for the referral.

## 2016-03-28 NOTE — Discharge Summary (Signed)
[place] [facility] [place] NAME: Kent Stanley    MR#:  [id number]  DATE OF BIRTH:  Sep 15, 1970  DATE OF ADMISSION:  03/27/2016 ADMITTING PHYSICIAN: Epifanio Lesches, MD  DATE OF DISCHARGE: 03/28/2016  PRIMARY CARE PHYSICIAN: Amy Annamary Rummage, NP    ADMISSION DIAGNOSIS:  Acute pulmonary edema (HCC) [J81.0] Dyspnea [R06.00] Chest pain at rest [R07.9] Chest pain, unspecified chest pain type [R07.9]  DISCHARGE DIAGNOSIS:  Active Problems:   Acute on chronic systolic CHF (congestive heart failure), NYHA class 1 (HCC)   Morbid obesity (HCC)   Polysubstance abuse   Seizure disorder (HCC)   NICM (nonischemic cardiomyopathy) ([place])   SECONDARY DIAGNOSIS:   Past Medical History:  Diagnosis Date  . Cerebellar Stroke    a. 08/2015 MRI: Acute/subacute L superior cerebellar infarct. Remote R cerebellar infarct;  b. 08/2015 Carotid U/S: normal carotids.  . Chronic systolic heart failure ([place])    a. 10/2012 Echo: EF <20%, mod to sev dil LV, diast dysfxn, mildly dil LA/RA, mod to severe MR/TR.  . CKD (chronic kidney disease), stage II   . Coronary artery disease, non-occlusive    a. 09/2014 Cath: minor luminal irregularities with severely dilated left ventricle. LVEDP 30.  [name] H/O noncompliance with medical treatment, presenting hazards to health   . History of traumatic injury of head   . Hyperlipidemia   . Hypertension   . Morbid obesity ([place])   . NICM (nonischemic cardiomyopathy) ([place])    a. 10/2012 Echo: EF <20%.  . Polysubstance abuse    a. cocaine, tobacco, THC, ETOH  . Seizure disorder ([place])   . Type II diabetes mellitus ([place])     HOSPITAL COURSE:   1.  Acute on chronic systolic congestive heart failure. Severe dilated cardiomyopathy. Current echocardiogram still pending. Patient diuresis with IV Lasix. Go back on his usual Lasix. Aldactone added. Patient already on metoprolol and enalapril. Needs to follow-up with cardiology as outpatient.  Consideration for defibrillator if EF is still very poor. 2. Chest pain. Patient had a cardiac catheter that was negative last year. No further workup as per cardiology. 3. Obesity weight loss recommended 4. History of stroke on Plavix 5. Type 2 diabetes. Continue usual medications 6. Chronic kidney disease stage III. Continue to monitor as outpatient 7. Likely sleep apnea recommend outpatient sleep study. Dr. Velva Harman can order from his office. 8. Hyperlipidemia unspecified continue pravastatin  DISCHARGE CONDITIONS:   Satisfactory  CONSULTS OBTAINED:  Treatment Team:  Wellington Hampshire, MD  DRUG ALLERGIES:   Allergies  Allergen Reactions  . Tomato Rash    DISCHARGE MEDICATIONS:   Current Discharge Medication List    START taking these medications   Details  nitroGLYCERIN (NITROSTAT) 0.4 MG SL tablet Place 1 tablet (0.4 mg total) under the tongue every 5 (five) minutes as needed for chest pain. Qty: 30 tablet, Refills: 0    spironolactone (ALDACTONE) 25 MG tablet Take 0.5 tablets (12.5 mg total) by mouth daily. Qty: 30 tablet, Refills: 0      CONTINUE these medications which have NOT CHANGED   Details  albuterol (PROVENTIL HFA;VENTOLIN HFA) 108 (90 Base) MCG/ACT inhaler Inhale 2 puffs into the lungs every 6 (six) hours as needed for wheezing or shortness of breath. Qty: 1 Inhaler, Refills: 5   Associated Diagnoses: Chest tightness    blood glucose meter kit and supplies KIT Dispense based on patient and insurance preference. Check blood glucose once daily. Qty: 1 each, Refills: 0  Associated Diagnoses: New onset type 2 diabetes mellitus (HCC)    clopidogrel (PLAVIX) 75 MG tablet Take 1 tablet (75 mg total) by mouth daily. Qty: 30 tablet, Refills: 11   Associated Diagnoses: Cerebrovascular accident, old    enalapril (VASOTEC) 2.5 MG tablet Take 1 tablet (2.5 mg total) by mouth daily. Qty: 30 tablet, Refills: 5   Associated Diagnoses: Chronic systolic heart failure  (HCC)    furosemide (LASIX) 40 MG tablet TAKE ONE TABLET BY MOUTH TWICE DAILY Qty: 60 tablet, Refills: 3    glipiZIDE (GLUCOTROL) 5 MG tablet Take 0.5 tablets (2.5 mg total) by mouth daily before breakfast. Qty: 30 tablet, Refills: 0    metoprolol succinate (TOPROL-XL) 25 MG 24 hr tablet Take 25 mg by mouth every morning.     pravastatin (PRAVACHOL) 80 MG tablet Take 1 tablet (80 mg total) by mouth daily at 6 PM. Qty: 30 tablet, Refills: 6      STOP taking these medications     sildenafil (VIAGRA) 25 MG tablet      dextromethorphan-guaiFENesin (MUCINEX DM) 30-600 MG 12hr tablet      ranitidine (ZANTAC) 150 MG capsule          DISCHARGE INSTRUCTIONS:   Follow-up Dr. Fletcher [name] as scheduled. Follow-up PMD in 1 week  If you experience worsening of your admission symptoms, develop shortness of breath, life threatening emergency, suicidal or homicidal thoughts you must seek medical attention immediately by calling 911 or calling your MD immediately  if symptoms less severe.  You Must read complete instructions/literature along with all the possible adverse reactions/side effects for all the Medicines you take and that have been prescribed to you. Take any new Medicines after you have completely understood and accept all the possible adverse reactions/side effects.   Please note  You were cared for by a hospitalist during your hospital stay. If you have any questions about your discharge medications or the care you received while you were in the hospital after you are discharged, you can call the unit and asked to speak with the hospitalist on call if the hospitalist that took care of you is not available. Once you are discharged, your primary care physician will handle any further medical issues. Please note that NO REFILLS for any discharge medications will be authorized once you are discharged, as it is imperative that you return to your primary care physician (or establish a  relationship with a primary care physician if you do not have one) for your aftercare needs so that they can reassess your need for medications and monitor your lab values.    Today   CHIEF COMPLAINT:   Chief Complaint  Patient presents with  . Chest Pain    HISTORY OF PRESENT ILLNESS:  Flora Ratz  is a 45 y.o. male presented with chest pain and had a borderline troponin. Patient found an acute on chronic congestive heart failure   VITAL SIGNS:  Blood pressure 93/69, pulse 85, temperature 98.4 F (36.9 C), temperature source Oral, resp. rate 18, height 6' (1.829 m), weight (!) 147.6 kg (325 lb 8 oz), SpO2 98 %.   PHYSICAL EXAMINATION:  GENERAL:  45 y.o.-year-old patient lying in the bed with no acute distress.  EYES: Pupils equal, round, reactive to light and accommodation. No scleral icterus. Extraocular muscles intact.  HEENT: Head atraumatic, normocephalic. Oropharynx and nasopharynx clear.  NECK:  Supple, no jugular venous distention. No thyroid enlargement, no tenderness.  LUNGS: Normal breath sounds bilaterally, no wheezing, rales,rhonchi  or crepitation. No use of accessory muscles of respiration.  CARDIOVASCULAR: S1, S2 normal. No murmurs, rubs, or gallops.  ABDOMEN: Soft, non-tender, non-distended. Bowel sounds present. No organomegaly or mass.  EXTREMITIES: 2+ edema, no cyanosis, or clubbing.  NEUROLOGIC: Cranial nerves II through XII are intact. Muscle strength 5/5 in all extremities. Sensation intact. Gait not checked.  PSYCHIATRIC: The patient is alert and oriented x 3.  SKIN: No obvious rash, lesion, or ulcer.   DATA REVIEW:   CBC  Recent Labs Lab 03/28/16 0409  WBC 7.4  HGB 14.8  HCT 44.4  PLT 152    Chemistries   Recent Labs Lab 03/27/16 1510  03/28/16 0409  NA 139  --  139  K 3.7  --  3.8  CL 107  --  104  CO2 28  --  29  GLUCOSE 141*  --  136*  BUN 16  --  20  CREATININE 1.42*  < > 1.55*  CALCIUM 8.8*  --  8.9  AST 25  --   --   ALT  24  --   --   ALKPHOS 48  --   --   BILITOT 0.7  --   --   < > = values in this interval not displayed.  Cardiac Enzymes  Recent Labs Lab 03/28/16 0409  TROPONINI 0.07*     RADIOLOGY:  Dg Chest 2 View  Result Date: 03/27/2016 CLINICAL DATA:  Shortness of breath and chest pain for 1 week, hypertension, CHF, diabetes mellitus EXAM: CHEST  2 VIEW COMPARISON:  02/12/2016 FINDINGS: Enlargement of cardiac silhouette with pulmonary vascular congestion. Mediastinal contours normal. Lungs clear. No pleural effusion or pneumothorax. Bones unremarkable. IMPRESSION: Enlargement of cardiac silhouette with pulmonary vascular congestion. No acute infiltrate. Electronically Signed   By: Lavonia Dana M.D.   On: 03/27/2016 16:11     Management plans discussed with the patient, And he is in agreement.  CODE STATUS:     Code Status Orders        Start     Ordered   03/27/16 1703  Full code  Continuous     03/27/16 1708    Code Status History    Date Active Date Inactive Code Status Order ID Comments User Context   03/27/2016  5:08 PM 03/28/2016 12:15 PM Full Code [id number]  Epifanio Lesches, MD ED   08/12/2015  9:21 AM 08/13/2015  9:01 PM Full Code [id number]  Bettey Costa, MD Inpatient      TOTAL TIME TAKING CARE OF THIS PATIENT: 35 minutes.    Loletha Grayer M.D on 03/28/2016 at 2:50 PM  Between 7am to 6pm - Pager - [phone]  After 6pm go to [email or web address] - password [email or web address]  [employer] Office  [phone]  CC: Primary care physician; Amy Annamary Rummage, NP

## 2016-03-28 NOTE — Progress Notes (Signed)
Pt went to sleep about 2-3 A.M. C/O headache x1 with medication given with effective results. Continues on room air. A&O. Will continue to monitor.

## 2016-04-04 ENCOUNTER — Ambulatory Visit: Payer: BLUE CROSS/BLUE SHIELD | Admitting: Family

## 2016-04-08 ENCOUNTER — Ambulatory Visit (INDEPENDENT_AMBULATORY_CARE_PROVIDER_SITE_OTHER): Payer: Medicaid Other | Admitting: Cardiovascular Disease

## 2016-04-08 ENCOUNTER — Encounter: Payer: Self-pay | Admitting: Cardiovascular Disease

## 2016-04-08 VITALS — BP 100/70 | HR 100 | Ht 72.0 in | Wt 323.5 lb

## 2016-04-08 DIAGNOSIS — I428 Other cardiomyopathies: Secondary | ICD-10-CM

## 2016-04-08 DIAGNOSIS — I5022 Chronic systolic (congestive) heart failure: Secondary | ICD-10-CM

## 2016-04-08 DIAGNOSIS — G4730 Sleep apnea, unspecified: Secondary | ICD-10-CM

## 2016-04-08 NOTE — Patient Instructions (Addendum)
Medication Instructions:  Your physician recommends that you continue on your current medications as directed. Please refer to the Current Medication list given to you today.   Labwork: BMET today  Testing/Procedures: none  Follow-Up: Your physician recommends that you schedule a follow-up appointment in one month with Dr. Kirke CorinArida.    Any Other Special Instructions Will Be Listed Below (If Applicable). Referral to pulmonary for sleep study     If you need a refill on your cardiac medications before your next appointment, please call your pharmacy.   Low-Sodium Eating Plan Sodium raises blood pressure and causes water to be held in the body. Getting less sodium from food will help lower your blood pressure, reduce any swelling, and protect your heart, liver, and kidneys. We get sodium by adding salt (sodium chloride) to food. Most of our sodium comes from canned, boxed, and frozen foods. Restaurant foods, fast foods, and pizza are also very high in sodium. Even if you take medicine to lower your blood pressure or to reduce fluid in your body, getting less sodium from your food is important. WHAT IS MY PLAN? Most people should limit their sodium intake to 2,300 mg a day. Your health care provider recommends that you limit your sodium intake. WHAT DO I NEED TO KNOW ABOUT THIS EATING PLAN? For the low-sodium eating plan, you will follow these general guidelines:  Choose foods with a % Daily Value for sodium of less than 5% (as listed on the food label).   Use salt-free seasonings or herbs instead of table salt or sea salt.   Check with your health care provider or pharmacist before using salt substitutes.   Eat fresh foods.  Eat more vegetables and fruits.  Limit canned vegetables. If you do use them, rinse them well to decrease the sodium.   Limit cheese to 1 oz (28 g) per day.   Eat lower-sodium products, often labeled as "lower sodium" or "no salt added."  Avoid foods  that contain monosodium glutamate (MSG). MSG is sometimes added to [place] food and some canned foods.  Check food labels (Nutrition Facts labels) on foods to learn how much sodium is in one serving.  Eat more home-cooked food and less restaurant, buffet, and fast food.  When eating at a restaurant, ask that your food be prepared with less salt, or no salt if possible.  HOW DO I READ FOOD LABELS FOR SODIUM INFORMATION? The Nutrition Facts label lists the amount of sodium in one serving of the food. If you eat more than one serving, you must multiply the listed amount of sodium by the number of servings. Food labels may also identify foods as:  Sodium free--Less than 5 mg in a serving.  Very low sodium--35 mg or less in a serving.  Low sodium--140 mg or less in a serving.  Light in sodium--50% less sodium in a serving. For example, if a food that usually has 300 mg of sodium is changed to become light in sodium, it will have 150 mg of sodium.  Reduced sodium--25% less sodium in a serving. For example, if a food that usually has 400 mg of sodium is changed to reduced sodium, it will have 300 mg of sodium. WHAT FOODS CAN I EAT? Grains Low-sodium cereals, including oats, puffed wheat and rice, and shredded wheat cereals. Low-sodium crackers. Unsalted rice and pasta. Lower-sodium bread.  Vegetables Frozen or fresh vegetables. Low-sodium or reduced-sodium canned vegetables. Low-sodium or reduced-sodium tomato sauce and paste. Low-sodium or reduced-sodium  tomato and vegetable juices.  Fruits Fresh, frozen, and canned fruit. Fruit juice.  Meat and Other Protein Products Low-sodium canned tuna and salmon. Fresh or frozen meat, poultry, seafood, and fish. Lamb. Unsalted nuts. Dried beans, peas, and lentils without added salt. Unsalted canned beans. Homemade soups without salt. Eggs.  Dairy Milk. Soy milk. Ricotta cheese. Low-sodium or reduced-sodium cheeses. Yogurt.  Condiments Fresh  and dried herbs and spices. Salt-free seasonings. Onion and garlic powders. Low-sodium varieties of mustard and ketchup. Fresh or refrigerated horseradish. Lemon juice.  Fats and Oils Reduced-sodium salad dressings. Unsalted butter.  Other Unsalted popcorn and pretzels.  The items listed above may not be a complete list of recommended foods or beverages. Contact your dietitian for more options. WHAT FOODS ARE NOT RECOMMENDED? Grains Instant hot cereals. Bread stuffing, pancake, and biscuit mixes. Croutons. Seasoned rice or pasta mixes. Noodle soup cups. Boxed or frozen macaroni and cheese. Self-rising flour. Regular salted crackers. Vegetables Regular canned vegetables. Regular canned tomato sauce and paste. Regular tomato and vegetable juices. Frozen vegetables in sauces. Salted [place] fries. Olives. Rosita Fire. Relishes. Sauerkraut. Salsa. Meat and Other Protein Products Salted, canned, smoked, spiced, or pickled meats, seafood, or fish. Bacon, ham, sausage, hot dogs, corned beef, chipped beef, and packaged luncheon meats. Salt pork. Jerky. Pickled herring. Anchovies, regular canned tuna, and sardines. Salted nuts. Dairy Processed cheese and cheese spreads. Cheese curds. Blue cheese and cottage cheese. Buttermilk.  Condiments Onion and garlic salt, seasoned salt, table salt, and sea salt. Canned and packaged gravies. Worcestershire sauce. Tartar sauce. Barbecue sauce. Teriyaki sauce. Soy sauce, including reduced sodium. Steak sauce. Fish sauce. Oyster sauce. Cocktail sauce. Horseradish that you find on the shelf. Regular ketchup and mustard. Meat flavorings and tenderizers. Bouillon cubes. Hot sauce. Tabasco sauce. Marinades. Taco seasonings. Relishes. Fats and Oils Regular salad dressings. Salted butter. Margarine. Ghee. Bacon fat.  Other Potato and tortilla chips. Corn chips and puffs. Salted popcorn and pretzels. Canned or dried soups. Pizza. Frozen entrees and pot pies.  The  items listed above may not be a complete list of foods and beverages to avoid. Contact your dietitian for more information.   This information is not intended to replace advice given to you by your health care provider. Make sure you discuss any questions you have with your health care provider.   Document Released: 12/13/2001 Document Revised: 07/14/2014 Document Reviewed: 04/27/2013 Elsevier Interactive Patient Education [employer]

## 2016-04-08 NOTE — Progress Notes (Signed)
Cardiology Office Note   Date:  04/08/2016   ID:  Kent Stanley., DOB October 22, 1970, MRN [id number]  PCP:  Leata Mouse, NP  Cardiologist:   Kathlyn Sacramento, MD   Chief Complaint  Patient presents with  . other    Pt. c/o of chest pain and shortness of breath. Meds reviewed by the pt. verbally.        History of Present Illness: Kent Stanley. is a 45 y.o. male who presents for a follow-up visit regarding chronic systolic heart failure due to nonischemic cardiomyopathy. He was hospitalized at [facility] in April of 2014 with congestive heart failure. He was found to have an ejection fraction of less than 20%. He was treated medically with improvement in symptoms. He reports history of a seizure disorder since he was 87, hypertension and likely sleep apnea. He is obese and has chronic kidney disease. He quit smoking in 2014. He drinks red wine very rarely. He has used marijuana and cocaine in the past but none recently. Cardiac catheterization in March, 2016 showed minor luminal irregularities without evidence of obstructive coronary artery disease. Left ventricular angiography was not performed due to chronic kidney disease. Left ventricular end-diastolic pressure was 30 mmHg. He was noted to have significant apnea episodes during cardiac cath with bradycardia.  He was hospitalized in February 2017 with a stroke. He has been on Plavix for that indication. He lost to follow-up with me and was not seen for more than a year. He was hospitalized recently at [facility] due to acute on chronic systolic heart failure. He improved to diuresis with IV furosemide. He was started on small dose spironolactone. Echocardiogram showed an ejection fraction of 20-25%. The patient reports improvement in dyspnea and orthopnea with stable weight since hospital discharge. He has applied for disability and that has not been approved.[name] He does not have any extremity to get any additional medications.   Past Medical  History:  Diagnosis Date  . Cerebellar Stroke    a. 08/2015 MRI: Acute/subacute L superior cerebellar infarct. Remote R cerebellar infarct;  b. 08/2015 Carotid U/S: normal carotids.  . Chronic systolic heart failure ([place])    a. 10/2012 Echo: EF <20%, mod to sev dil LV, diast dysfxn, mildly dil LA/RA, mod to severe MR/TR.  . CKD (chronic kidney disease), stage II   . Coronary artery disease, non-occlusive    a. 09/2014 Cath: minor luminal irregularities with severely dilated left ventricle. LVEDP 30.  [name] H/O noncompliance with medical treatment, presenting hazards to health   . History of traumatic injury of head   . Hyperlipidemia   . Hypertension   . Morbid obesity ([place])   . NICM (nonischemic cardiomyopathy) ([place])    a. 10/2012 Echo: EF <20%.  . Polysubstance abuse    a. cocaine, tobacco, THC, ETOH  . Seizure disorder ([place])   . Type II diabetes mellitus ([name])     Past Surgical History:  Procedure Laterality Date  . CARDIAC CATHETERIZATION     [facility]     Current Outpatient Prescriptions  Medication Sig Dispense Refill  . albuterol (PROVENTIL HFA;VENTOLIN HFA) 108 (90 Base) MCG/ACT inhaler Inhale 2 puffs into the lungs every 6 (six) hours as needed for wheezing or shortness of breath. 1 Inhaler 11  . blood glucose meter kit and supplies KIT Dispense based on patient and insurance preference. Check blood glucose once daily. 1 each 0  . clopidogrel (PLAVIX) 75 MG tablet Take 1 tablet (75 mg  total) by mouth daily. (Patient taking differently: Take 75 mg by mouth every morning. ) 30 tablet 11  . enalapril (VASOTEC) 2.5 MG tablet Take 1 tablet (2.5 mg total) by mouth daily. (Patient taking differently: Take 2.5 mg by mouth every morning. ) 30 tablet 5  . furosemide (LASIX) 40 MG tablet TAKE ONE TABLET BY MOUTH TWICE DAILY 60 tablet 3  . glipiZIDE (GLUCOTROL) 5 MG tablet Take 0.5 tablets (2.5 mg total) by mouth daily before breakfast. 30 tablet 0  . metoprolol succinate (TOPROL-XL) 25 MG 24 hr  tablet Take 25 mg by mouth every morning.     . nitroGLYCERIN (NITROSTAT) 0.4 MG SL tablet Place 1 tablet (0.4 mg total) under the tongue every 5 (five) minutes as needed for chest pain. 30 tablet 0  . pravastatin (PRAVACHOL) 80 MG tablet Take 1 tablet (80 mg total) by mouth daily at 6 PM. 30 tablet 6  . spironolactone (ALDACTONE) 25 MG tablet Take 0.5 tablets (12.5 mg total) by mouth daily. 30 tablet 0   No current facility-administered medications for this visit.     Allergies:   Tomato    Social History:  The patient  reports that he quit smoking about 8 months ago. His smoking use included Cigarettes. He quit after 10.00 years of use. He has never used smokeless tobacco. He reports that he drinks alcohol. He reports that he does not use drugs.   Family History:  The patient's family history includes Brain cancer in his mother; Diabetes in his father and mother; Heart attack in his father and mother; Heart disease in his father; Heart failure in his father; Hyperlipidemia in his father; Hypertension in his father.    ROS:  Please see the history of present illness.   Otherwise, review of systems are positive for none.   All other systems are reviewed and negative.    PHYSICAL EXAM: VS:  BP 100/70 (BP Location: Right Arm, Patient Position: Sitting, Cuff Size: Large)   Pulse 100   Ht 6' (1.829 m)   Wt (!) 323 lb 8 oz (146.7 kg)   BMI 43.87 kg/m  , BMI Body mass index is 43.87 kg/m. GEN: Well nourished, well developed, in no acute distress  HEENT: normal  Neck: no JVD, carotid bruits, or masses Cardiac: RRR; no murmurs, rubs, or gallops,no edema  Respiratory:  clear to auscultation bilaterally, normal work of breathing GI: soft, nontender, nondistended, + BS MS: no deformity or atrophy  Skin: warm and dry, no rash Neuro:  Strength and sensation are intact Psych: euthymic mood, full affect   EKG:  EKG is ordered today. The ekg ordered today demonstrates normal sinus rhythm,  incomplete left bundle branch block with QRS duration of 100 ms   Recent Labs: 02/12/2016: Brain Natriuretic Peptide 403.8 03/27/2016: ALT 24 03/28/2016: BUN 20; Creatinine, Ser 1.55; Hemoglobin 14.8; Platelets 152; Potassium 3.8; Sodium 139    Lipid Panel    Component Value Date/Time   CHOL 127 02/12/2016 1404   CHOL 183 10/14/2012 0354   TRIG 108 02/12/2016 1404   TRIG 79 10/14/2012 0354   HDL 28 (L) 02/12/2016 1404   HDL 44 10/14/2012 0354   CHOLHDL 4.5 02/12/2016 1404   VLDL 22 02/12/2016 1404   VLDL 16 10/14/2012 0354   LDLCALC 77 02/12/2016 1404   LDLCALC 123 (H) 10/14/2012 0354      Wt Readings from Last 3 Encounters:  04/08/16 (!) 323 lb 8 oz (146.7 kg)  03/28/16 (!) 325  lb 8 oz (147.6 kg)  03/27/16 (!) 336 lb 6.4 oz (152.6 kg)       PAD Screen 04/08/2016  Previous PAD dx? No  Previous surgical procedure? No  Pain with walking? Yes  Subsides with rest? Yes  Feet/toe relief with dangling? No  Painful, non-healing ulcers? No  Extremities discolored? No      ASSESSMENT AND PLAN:  1.  Chronic systolic heart failure: Due to nonischemic cardiomyopathy. Currently New York Heart Association class III. He appears to be euvolemic. Given that we recently started spironolactone, I requested basic metabolic profile. His blood pressure is low and thus I am not able to increase his heart failure medications. He continues to have sinus tachycardia and I think he would benefit from treatment with Ivabradine. However, he reports inability to afford any new medication. His QRS is not wide enough for CRT. I discussed with him the importance of low sodium diet.  2. Probable sleep apnea: Based on his symptoms. I think this is significantly affecting his cardiac status. I referred him to pulmonary for evaluation.  3. Previous stroke: Currently on Plavix.    Disposition:   FU with me in 1 month  Signed,  Kathlyn Sacramento, MD  04/08/2016 4:01 PM    [facility]

## 2016-04-09 LAB — BASIC METABOLIC PANEL
BUN/Creatinine Ratio: 11 (ref 9–20)
BUN: 18 mg/dL (ref 6–24)
CO2: 25 mmol/L (ref 18–29)
Calcium: 9.5 mg/dL (ref 8.7–10.2)
Chloride: 98 mmol/L (ref 96–106)
Creatinine, Ser: 1.62 mg/dL — ABNORMAL HIGH (ref 0.76–1.27)
GFR calc Af Amer: 58 mL/min/{1.73_m2} — ABNORMAL LOW (ref >59)
GFR calc non Af Amer: 50 mL/min/{1.73_m2} — ABNORMAL LOW (ref >59)
GLUCOSE: 106 mg/dL — AB (ref 65–99)
POTASSIUM: 3.9 mmol/L (ref 3.5–5.2)
SODIUM: 140 mmol/L (ref 134–144)

## 2016-04-10 ENCOUNTER — Ambulatory Visit (INDEPENDENT_AMBULATORY_CARE_PROVIDER_SITE_OTHER): Payer: Medicaid Other | Admitting: Family Medicine

## 2016-04-10 VITALS — BP 112/72 | HR 92 | Temp 97.8°F | Resp 16 | Ht 72.0 in | Wt 325.0 lb

## 2016-04-10 DIAGNOSIS — Z9289 Personal history of other medical treatment: Secondary | ICD-10-CM | POA: Diagnosis not present

## 2016-04-10 DIAGNOSIS — E119 Type 2 diabetes mellitus without complications: Secondary | ICD-10-CM

## 2016-04-10 DIAGNOSIS — G4733 Obstructive sleep apnea (adult) (pediatric): Secondary | ICD-10-CM

## 2016-04-10 DIAGNOSIS — I5022 Chronic systolic (congestive) heart failure: Secondary | ICD-10-CM

## 2016-04-10 NOTE — Assessment & Plan Note (Signed)
Continue follow-up with Dr. Kirke Corin. Pt advised to call cardiology for weight gain, increasing use of nitroglycerin, leg swelling, SOB, or other concerning symptoms. Reviewed need to weight monitoring and diet compliance.

## 2016-04-10 NOTE — Patient Instructions (Signed)
Please keep your follow-up appointment with Dr. Kirke Corin next month. Weight yourself daily. Eat no more than 2400mg  sodium per day.  If you gain 2lbs over night or 5lbs in 5 days please call your cardiologist.   In regards to the Ivarbradine that you are concerned about paying for- I would discuss with Dr. Kirke Corin if it is possible to do a prior authorization for this medicine. Medicaid may pay for it if it is your only option.   Please keep appt with pulmonology for your sleep study.   Please seek immediate medical attention at ER or Urgent Care if you develop: Chest pain, pressure or tightness. Shortness of breath accompanied by nausea or diaphoresis Visual changes Numbness or tingling on one side of the body Facial droop Altered mental status Or any concerning symptoms.

## 2016-04-10 NOTE — Progress Notes (Signed)
Subjective:    Patient ID: Kent Kindred., male    DOB: June 18, 1971, 45 y.o.   MRN: [id number]  HPI: Kent Cieslinski. is a 45 y.o. male presenting on 04/10/2016 for Hospitalization Follow-up   HPI  Pt presents for hospitalization follow-up for acute CHF exacerbation. He has followed up with cardiology this week as well. Loss of 9lbs with IV diuresis. Started on spironolactone in the hospital. Is still having some chest pain taking PRN nitro. Has not taken any in 2 days.  Dr. Fletcher [name] referred him to pulmonology for a sleep study to follow-up on sleep apnea. It was set up in 2015 but never done.  His appt for pulmonology is on October 11- he has a court date that day and is unsure if he can make it.   Past Medical History:  Diagnosis Date  . Cerebellar Stroke    a. 08/2015 MRI: Acute/subacute L superior cerebellar infarct. Remote R cerebellar infarct;  b. 08/2015 Carotid U/S: normal carotids.  . Chronic systolic heart failure ([place])    a. 10/2012 Echo: EF <20%, mod to sev dil LV, diast dysfxn, mildly dil LA/RA, mod to severe MR/TR.  . CKD (chronic kidney disease), stage II   . Coronary artery disease, non-occlusive    a. 09/2014 Cath: minor luminal irregularities with severely dilated left ventricle. LVEDP 30.  [name] H/O noncompliance with medical treatment, presenting hazards to health   . History of traumatic injury of head   . Hyperlipidemia   . Hypertension   . Morbid obesity ([place])   . NICM (nonischemic cardiomyopathy) ([place])    a. 10/2012 Echo: EF <20%.  . Polysubstance abuse    a. cocaine, tobacco, THC, ETOH  . Seizure disorder ([place])   . Type II diabetes mellitus ([place])     Current Outpatient Prescriptions on File Prior to Visit  Medication Sig  . albuterol (PROVENTIL HFA;VENTOLIN HFA) 108 (90 Base) MCG/ACT inhaler Inhale 2 puffs into the lungs every 6 (six) hours as needed for wheezing or shortness of breath.  . blood glucose meter kit and supplies KIT Dispense based on patient  and insurance preference. Check blood glucose once daily.  . clopidogrel (PLAVIX) 75 MG tablet Take 1 tablet (75 mg total) by mouth daily. (Patient taking differently: Take 75 mg by mouth every morning. )  . enalapril (VASOTEC) 2.5 MG tablet Take 1 tablet (2.5 mg total) by mouth daily. (Patient taking differently: Take 2.5 mg by mouth every morning. )  . furosemide (LASIX) 40 MG tablet TAKE ONE TABLET BY MOUTH TWICE DAILY  . glipiZIDE (GLUCOTROL) 5 MG tablet Take 0.5 tablets (2.5 mg total) by mouth daily before breakfast.  . metoprolol succinate (TOPROL-XL) 25 MG 24 hr tablet Take 25 mg by mouth every morning.   . nitroGLYCERIN (NITROSTAT) 0.4 MG SL tablet Place 1 tablet (0.4 mg total) under the tongue every 5 (five) minutes as needed for chest pain.  . pravastatin (PRAVACHOL) 80 MG tablet Take 1 tablet (80 mg total) by mouth daily at 6 PM.  . spironolactone (ALDACTONE) 25 MG tablet Take 0.5 tablets (12.5 mg total) by mouth daily.   No current facility-administered medications on file prior to visit.     Review of Systems  Constitutional: Negative for chills and fever.  HENT: Negative.   Respiratory: Negative for chest tightness, shortness of breath and wheezing.   Cardiovascular: Positive for chest pain. Negative for palpitations and leg swelling.  Gastrointestinal: Negative for abdominal pain, nausea and  vomiting.  Endocrine: Negative.   Genitourinary: Negative for discharge, dysuria, penile pain, testicular pain and urgency.  Musculoskeletal: Negative for arthralgias, back pain and joint swelling.  Skin: Negative.   Neurological: Negative for dizziness ( addressed by cardiology. ), weakness, numbness and headaches.  Psychiatric/Behavioral: Negative for dysphoric mood and sleep disturbance.   Per HPI unless specifically indicated above     Objective:    BP 112/72 (BP Location: Right Arm, Patient Position: Sitting, Cuff Size: Large)   Pulse 92   Temp 97.8 F (36.6 C) (Oral)   Resp  16   Ht 6' (1.829 m)   Wt (!) 325 lb (147.4 kg)   SpO2 98%   BMI 44.08 kg/m   Wt Readings from Last 3 Encounters:  04/10/16 (!) 325 lb (147.4 kg)  04/08/16 (!) 323 lb 8 oz (146.7 kg)  03/28/16 (!) 325 lb 8 oz (147.6 kg)    Physical Exam  Constitutional: He is oriented to person, place, and time. He appears well-developed and well-nourished. No distress.  HENT:  Head: Normocephalic and atraumatic.  Neck: Neck supple. No thyromegaly present.  Cardiovascular: Normal rate, regular rhythm and normal heart sounds.  Exam reveals no gallop and no friction rub.   No murmur heard. Pulmonary/Chest: Effort normal and breath sounds normal. He has no wheezes.  Abdominal: Soft. Bowel sounds are normal. He exhibits no distension. There is no tenderness. There is no rebound.  Musculoskeletal: Normal range of motion. He exhibits no edema or tenderness.  Neurological: He is alert and oriented to person, place, and time. He has normal reflexes.  Skin: Skin is warm and dry. No rash noted. No erythema.  Psychiatric: He has a normal mood and affect. His behavior is normal. Thought content normal.   Results for orders placed or performed in visit on 01/77/93  Basic Metabolic Panel (BMET)  Result Value Ref Range   Glucose 106 (H) 65 - 99 mg/dL   BUN 18 6 - 24 mg/dL   Creatinine, Ser 1.62 (H) 0.76 - 1.27 mg/dL   GFR calc non Af Amer 50 (L) >59 mL/min/1.73   GFR calc Af Amer 58 (L) >59 mL/min/1.73   BUN/Creatinine Ratio 11 9 - 20   Sodium 140 134 - 144 mmol/L   Potassium 3.9 3.5 - 5.2 mmol/L   Chloride 98 96 - 106 mmol/L   CO2 25 18 - 29 mmol/L   Calcium 9.5 8.7 - 10.2 mg/dL      Assessment & Plan:   Problem List Items Addressed This Visit      Cardiovascular and Mediastinum   Congestive heart failure, NYHA class 3, chronic, systolic (HCC)    Continue follow-up with Dr. Fletcher [name]. Pt advised to call cardiology for weight gain, increasing use of nitroglycerin, leg swelling, SOB, or other concerning  symptoms. Reviewed need to weight monitoring and diet compliance.         Respiratory   Sleep apnea    Probable OSA- he has been referred to pulmonology for evaluation. Encouraged pt to keep this appt as OSA can cause worsening CHF.  Have written letter asking that he be excuse from court due to this issue.         Endocrine   New onset type 2 diabetes mellitus ([place])    Continue current medications and plan for diabetes follow-up in 1 mos. Pt is declining eye exam at this time due to lack of funds.        Other Visit Diagnoses  Hospitalization within last 30 days    -  Primary   Hopsital follow-up. Medications reconciled. Pt has follow-up with cardiology. No need for repeat lab work at this time.       No orders of the defined types were placed in this encounter.     Follow up plan: Return in about 5 weeks (around 05/15/2016) for Diabetes. [name]

## 2016-04-10 NOTE — Assessment & Plan Note (Signed)
Probable OSA- he has been referred to pulmonology for evaluation. Encouraged pt to keep this appt as OSA can cause worsening CHF.  Have written letter asking that he be excuse from court due to this issue.

## 2016-04-10 NOTE — Assessment & Plan Note (Signed)
Continue current medications and plan for diabetes follow-up in 1 mos. Pt is declining eye exam at this time due to lack of funds.

## 2016-04-16 ENCOUNTER — Institutional Professional Consult (permissible substitution): Payer: Medicaid Other | Admitting: Internal Medicine

## 2016-04-16 ENCOUNTER — Ambulatory Visit: Payer: Medicaid Other | Admitting: Family

## 2016-04-16 NOTE — Addendum Note (Signed)
Addended by: Kendrick Fries on: 04/16/2016 03:14 PM   Modules accepted: Orders

## 2016-04-17 NOTE — Progress Notes (Signed)
[facility] [place] [facility] Consultation      Assessment and Plan:  Excessive daytime sleepiness snoring and witnessed apneas.  -Symptoms and signs of obstructive sleep apnea, we will send for sleep study.  Obesity. -BMI equals 44. -Can contribute to obstructive sleep apnea, discussed importance of weight loss.  Ischemic cardiomyopathy. Essential hypertension. Stroke. -Above conditions can be contributed to by obstructive sleep apnea, therefore, it is important to treat this condition if present.   Date: 04/17/2016  MRN# [id number] Kent Stanley. 1970-11-18  Kent Stanley. is a 45 y.o. old male seen in consultation for chief complaint of:    Chief Complaint  Patient presents with  . sleep consult    per Dr. Kirke Corin. no prior sleep study. pt c/o loud snoring, restless sleep & gasping for air occ. EPWORTH:15    HPI:   The patient is a 45 year old male with a history of cerebellar stroke, cardiomyopathy, substance abuse, noncompliance with therapy, morbid obesity, diabetes mellitus..  Patient notes that that he's been told that he stops breathing while sleeping on multiple occasions including while in the hospital. He typically goes to bed at around 2 AM, it takes him about 45 minutes to fall asleep. He wakes up 2-3 times per night. He gets out of bed around 7 or 8 AM. Epworth score is elevated at 15. He typically naps for 1-2 hours during the day. No sleepwalking, no sleep paralysis. He has multiple family members with OSA.    Review of echocardiogram results from 03/28/16: EF equals 20% View chest x-ray imaging from 03/27/16: Mild pulmonary edema, cardiomegaly.   PMHX:   Past Medical History:  Diagnosis Date  . Cerebellar Stroke    a. 08/2015 MRI: Acute/subacute L superior cerebellar infarct. Remote R cerebellar infarct;  b. 08/2015 Carotid U/S: normal carotids.  . Chronic systolic heart failure (HCC)    a. 10/2012 Echo: EF <20%, mod to sev dil LV, diast dysfxn,  mildly dil LA/RA, mod to severe MR/TR.  . CKD (chronic kidney disease), stage II   . Coronary artery disease, non-occlusive    a. 09/2014 Cath: minor luminal irregularities with severely dilated left ventricle. LVEDP 30.  [name] H/O noncompliance with medical treatment, presenting hazards to health   . History of traumatic injury of head   . Hyperlipidemia   . Hypertension   . Morbid obesity (HCC)   . NICM (nonischemic cardiomyopathy) (HCC)    a. 10/2012 Echo: EF <20%.  . Polysubstance abuse    a. cocaine, tobacco, THC, ETOH  . Seizure disorder (HCC)   . Type II diabetes mellitus (HCC)    Surgical Hx:  Past Surgical History:  Procedure Laterality Date  . CARDIAC CATHETERIZATION     [facility]   Family Hx:  Family History  Problem Relation Age of Onset  . Diabetes Mother   . Brain cancer Mother   . Heart attack Mother   . Hypertension Father   . Diabetes Father   . Heart disease Father   . Hyperlipidemia Father   . Heart attack Father   . Heart failure Father    Social Hx:   Social History  Substance Use Topics  . Smoking status: Former Smoker    Years: 10.00    Types: Cigarettes    Quit date: 08/07/2015  . Smokeless tobacco: Never Used  . Alcohol use Yes     Comment: socially   Medication:   Reviewed.     Allergies:  Tomato  Review of  Systems: Gen:  Denies  fever, sweats, chills HEENT: Denies blurred vision, double vision. bleeds, sore throat Cvc:  No dizziness, chest pain. Resp:   Denies cough or sputum production, shortness of breath Gi: Denies swallowing difficulty, stomach pain. Gu:  Denies bladder incontinence, burning urine Ext:   No Joint pain, stiffness. Skin: No skin rash,  hives  Endoc:  No polyuria, polydipsia. Psych: No depression, insomnia. Other:  All other systems were reviewed with the patient and were negative other that what is mentioned in the HPI.   Physical Examination:   VS: BP 110/62 (BP Location: Left Wrist, Cuff Size: Normal)   Pulse (!)  101   Ht 6' (1.829 m)   Wt (!) 325 lb (147.4 kg)   SpO2 96%   BMI 44.08 kg/m   General Appearance: No distress  Neuro:without focal findings,  speech normal,  HEENT: PERRLA, EOM intact.  Mallampati 3 Pulmonary: normal breath sounds, No wheezing.  CardiovascularNormal S1,S2.  No m/r/g.   Abdomen: Benign, Soft, non-tender. Renal:  No costovertebral tenderness  GU:  No performed at this time. Endoc: No evident thyromegaly, no signs of acromegaly. Skin:   warm, no rashes, no ecchymosis  Extremities: normal, no cyanosis, clubbing.  Other findings:    LABORATORY PANEL:   CBC No results for input(s): WBC, HGB, HCT, PLT in the last 168 hours. ------------------------------------------------------------------------------------------------------------------  Chemistries  No results for input(s): NA, K, CL, CO2, GLUCOSE, BUN, CREATININE, CALCIUM, MG, AST, ALT, ALKPHOS, BILITOT in the last 168 hours.  Invalid input(s): GFRCGP ------------------------------------------------------------------------------------------------------------------  Cardiac Enzymes No results for input(s): TROPONINI in the last 168 hours. ------------------------------------------------------------  RADIOLOGY:  No results found.     Thank  you for the consultation and for allowing [facility] to assist in the care of your patient. Our recommendations are noted above.  Please contact us if we can be of further service.   Wells Guileseep Reta Norgren, MD.  Board Certified in Internal Medicine, Pulmonary Medicine, Critical Care Medicine, and Sleep Medicine.  [facility] Office Number: [phone]  Santiago Gladavid Kasa, M.D.  Stephanie AcreVishal Mungal, M.D.  Billy Fischeravid Simonds, M.D  04/17/2016

## 2016-04-18 ENCOUNTER — Encounter: Payer: Self-pay | Admitting: Family

## 2016-04-18 ENCOUNTER — Encounter: Payer: Self-pay | Admitting: Internal Medicine

## 2016-04-18 ENCOUNTER — Other Ambulatory Visit: Payer: Self-pay | Admitting: Family Medicine

## 2016-04-18 ENCOUNTER — Ambulatory Visit: Payer: Medicaid Other | Attending: Family | Admitting: Family

## 2016-04-18 ENCOUNTER — Ambulatory Visit (INDEPENDENT_AMBULATORY_CARE_PROVIDER_SITE_OTHER): Payer: Medicaid Other | Admitting: Internal Medicine

## 2016-04-18 VITALS — BP 110/80 | HR 96 | Resp 18 | Ht 72.0 in | Wt 326.0 lb

## 2016-04-18 VITALS — BP 110/62 | HR 101 | Ht 72.0 in | Wt 325.0 lb

## 2016-04-18 DIAGNOSIS — I5022 Chronic systolic (congestive) heart failure: Secondary | ICD-10-CM | POA: Diagnosis present

## 2016-04-18 DIAGNOSIS — Z7984 Long term (current) use of oral hypoglycemic drugs: Secondary | ICD-10-CM | POA: Insufficient documentation

## 2016-04-18 DIAGNOSIS — E785 Hyperlipidemia, unspecified: Secondary | ICD-10-CM | POA: Insufficient documentation

## 2016-04-18 DIAGNOSIS — E1122 Type 2 diabetes mellitus with diabetic chronic kidney disease: Secondary | ICD-10-CM | POA: Insufficient documentation

## 2016-04-18 DIAGNOSIS — I130 Hypertensive heart and chronic kidney disease with heart failure and stage 1 through stage 4 chronic kidney disease, or unspecified chronic kidney disease: Secondary | ICD-10-CM | POA: Diagnosis not present

## 2016-04-18 DIAGNOSIS — G4733 Obstructive sleep apnea (adult) (pediatric): Secondary | ICD-10-CM | POA: Insufficient documentation

## 2016-04-18 DIAGNOSIS — Z91018 Allergy to other foods: Secondary | ICD-10-CM | POA: Diagnosis not present

## 2016-04-18 DIAGNOSIS — N182 Chronic kidney disease, stage 2 (mild): Secondary | ICD-10-CM | POA: Diagnosis not present

## 2016-04-18 DIAGNOSIS — G40909 Epilepsy, unspecified, not intractable, without status epilepticus: Secondary | ICD-10-CM | POA: Insufficient documentation

## 2016-04-18 DIAGNOSIS — I639 Cerebral infarction, unspecified: Secondary | ICD-10-CM

## 2016-04-18 DIAGNOSIS — Z9119 Patient's noncompliance with other medical treatment and regimen: Secondary | ICD-10-CM | POA: Diagnosis not present

## 2016-04-18 DIAGNOSIS — I2510 Atherosclerotic heart disease of native coronary artery without angina pectoris: Secondary | ICD-10-CM | POA: Diagnosis not present

## 2016-04-18 DIAGNOSIS — Z8249 Family history of ischemic heart disease and other diseases of the circulatory system: Secondary | ICD-10-CM | POA: Diagnosis not present

## 2016-04-18 DIAGNOSIS — Z8673 Personal history of transient ischemic attack (TIA), and cerebral infarction without residual deficits: Secondary | ICD-10-CM | POA: Diagnosis not present

## 2016-04-18 DIAGNOSIS — I10 Essential (primary) hypertension: Secondary | ICD-10-CM

## 2016-04-18 DIAGNOSIS — Z809 Family history of malignant neoplasm, unspecified: Secondary | ICD-10-CM | POA: Diagnosis not present

## 2016-04-18 DIAGNOSIS — I255 Ischemic cardiomyopathy: Secondary | ICD-10-CM

## 2016-04-18 DIAGNOSIS — Z87891 Personal history of nicotine dependence: Secondary | ICD-10-CM | POA: Insufficient documentation

## 2016-04-18 DIAGNOSIS — Z833 Family history of diabetes mellitus: Secondary | ICD-10-CM | POA: Insufficient documentation

## 2016-04-18 DIAGNOSIS — G4719 Other hypersomnia: Secondary | ICD-10-CM

## 2016-04-18 DIAGNOSIS — Z7902 Long term (current) use of antithrombotics/antiplatelets: Secondary | ICD-10-CM | POA: Insufficient documentation

## 2016-04-18 DIAGNOSIS — E1121 Type 2 diabetes mellitus with diabetic nephropathy: Secondary | ICD-10-CM | POA: Insufficient documentation

## 2016-04-18 DIAGNOSIS — Z6841 Body Mass Index (BMI) 40.0 and over, adult: Secondary | ICD-10-CM | POA: Insufficient documentation

## 2016-04-18 MED ORDER — SACUBITRIL-VALSARTAN 24-26 MG PO TABS: 1.0000 | ORAL_TABLET | Freq: Two times a day (BID) | ORAL | 3 refills | Status: DC

## 2016-04-18 NOTE — Progress Notes (Signed)
Patient ID: Kent Kindred., male    DOB: [birth year], 45 y.o.   MRN: [id number]  HPI  Kent Stanley is a 45 y/o male with a history of recent stroke (Feb 2017), CKD stage 2, CAD, hyperlipidemia, HTN, morbid obesity, polysubstance abuse in the past, prior tobacco use, seizures, DM and chronic heart failure.  Last echo was done 03/28/16 which showed an EF of 20-25% without regurgitation. Last catheterization was done March 2016.  Admitted to [facility] on 03/27/16 with heart failure exacerbation. Was treated with IV diuretics and spironolactone was added. Discussion about possible ICD in the future if the EF remains low.   He presents today for his initial visit with fatigue and shortness of breath with minimal exertion. Not weighing himself as he doesn't have any scales. No edema to report. Difficulty sleeping as he's waking up every few hours. + snoring. Hasn't been checking his glucose lately. Does walk with a cane due to continued balance issues after his stroke.   Past Medical History:  Diagnosis Date  . Cerebellar Stroke    a. 08/2015 MRI: Acute/subacute L superior cerebellar infarct. Remote R cerebellar infarct;  b. 08/2015 Carotid U/S: normal carotids.  . Chronic systolic heart failure ([place])    a. 10/2012 Echo: EF <20%, mod to sev dil LV, diast dysfxn, mildly dil LA/RA, mod to severe Kent/TR.  . CKD (chronic kidney disease), stage II   . Coronary artery disease, non-occlusive    a. 09/2014 Cath: minor luminal irregularities with severely dilated left ventricle. LVEDP 30.  [name] H/O noncompliance with medical treatment, presenting hazards to health   . History of traumatic injury of head   . Hyperlipidemia   . Hypertension   . Morbid obesity ([place])   . NICM (nonischemic cardiomyopathy) ([place])    a. 10/2012 Echo: EF <20%.  . Polysubstance abuse    a. cocaine, tobacco, THC, ETOH  . Seizure disorder ([place])   . Stroke ([place])   . Type II diabetes mellitus ([place])     Past Surgical History:  Procedure  Laterality Date  . CARDIAC CATHETERIZATION     [facility]    Family History  Problem Relation Age of Onset  . Diabetes Mother   . Brain cancer Mother   . Heart attack Mother   . Sleep apnea Mother   . Heart failure Mother   . Hypertension Father   . Diabetes Father   . Heart disease Father   . Hyperlipidemia Father   . Heart attack Father   . Heart failure Father   . Sleep apnea Father   . Sleep apnea Sister   . Sleep apnea Brother   . Heart failure Maternal Aunt     Social History  Substance Use Topics  . Smoking status: Former Smoker    Years: 10.00    Types: Cigarettes    Quit date: 08/07/2015  . Smokeless tobacco: Never Used  . Alcohol use No     Comment: socially    Allergies  Allergen Reactions  . Tomato Rash    Prior to Admission medications   Medication Sig Start Date End Date Taking? Authorizing Provider  albuterol (PROVENTIL HFA;VENTOLIN HFA) 108 (90 Base) MCG/ACT inhaler Inhale 2 puffs into the lungs every 6 (six) hours as needed for wheezing or shortness of breath. 03/28/16  Yes Amy Overton Mam, NP  blood glucose meter kit and supplies KIT Dispense based on patient and insurance preference. Check blood glucose once daily. 08/21/15  Yes Amy Ander Purpura  [name], NP  clopidogrel (PLAVIX) 75 MG tablet Take 1 tablet (75 mg total) by mouth daily. Patient taking differently: Take 75 mg by mouth every morning.  02/12/16  Yes Amy Overton Mam, NP  furosemide (LASIX) 40 MG tablet TAKE ONE TABLET BY MOUTH TWICE DAILY Patient taking differently: 2 tablets in the AM and 1 tablet in the PM 07/03/15  Yes Wellington Hampshire, MD  glipiZIDE (GLUCOTROL) 5 MG tablet Take 0.5 tablets (2.5 mg total) by mouth daily before breakfast. Patient taking differently: Take 5 mg by mouth daily before breakfast.  09/12/15  Yes Amy Overton Mam, NP  metoprolol succinate (TOPROL-XL) 25 MG 24 hr tablet Take 25 mg by mouth every morning.    Yes Historical Provider, MD  nitroGLYCERIN (NITROSTAT) 0.4 MG SL tablet  Place 1 tablet (0.4 mg total) under the tongue every 5 (five) minutes as needed for chest pain. 03/28/16  Yes Loletha Grayer, MD  pravastatin (PRAVACHOL) 80 MG tablet Take 1 tablet (80 mg total) by mouth daily at 6 PM. 09/12/15  Yes Amy Overton Mam, NP  spironolactone (ALDACTONE) 25 MG tablet Take 0.5 tablets (12.5 mg total) by mouth daily. 03/29/16  Yes Richard Leslye Peer, MD  sacubitril-valsartan (ENTRESTO) 24-26 MG Take 1 tablet by mouth 2 (two) times daily. 04/18/16   Alisa Graff, FNP     Review of Systems  Constitutional: Positive for fatigue. Negative for appetite change.  HENT: Negative for congestion, postnasal drip and sore throat.   Eyes: Negative.   Respiratory: Positive for shortness of breath (getting dressed/ putting socks on). Negative for chest tightness and wheezing.   Cardiovascular: Positive for chest pain (with exertion at times; relieved usually with 1 NTG) and palpitations. Negative for leg swelling.  Gastrointestinal: Negative for abdominal distention and abdominal pain.  Endocrine: Negative.   Genitourinary: Negative.   Musculoskeletal: Negative for back pain and neck pain.  Skin: Negative.   Allergic/Immunologic: Negative.   Neurological: Positive for dizziness and light-headedness (at times).       Off balance at times  Hematological: Negative for adenopathy. Does not bruise/bleed easily.  Psychiatric/Behavioral: Positive for dysphoric mood and sleep disturbance (waking up often). The patient is not nervous/anxious.    Vitals:   04/18/16 0849 04/18/16 0916  BP: 97/81 110/80  Pulse: 96   Resp: 18   SpO2: 100%   Weight: (!) 326 lb (147.9 kg)   Height: 6' (1.829 m)       Physical Exam  Constitutional: He is oriented to person, place, and time. He appears well-developed and well-nourished.  HENT:  Head: Normocephalic and atraumatic.  Eyes: Conjunctivae are normal. Pupils are equal, round, and reactive to light.  Neck: Normal range of motion. Neck supple.   Cardiovascular: Regular rhythm.  Tachycardia present.   Pulmonary/Chest: Effort normal. He has no wheezes. He has no rales.  Abdominal: Soft. He exhibits no distension. There is no tenderness.  Musculoskeletal: He exhibits no edema or tenderness.  Neurological: He is alert and oriented to person, place, and time.  Skin: Skin is warm and dry.  Psychiatric: He has a normal mood and affect. His behavior is normal. Thought content normal.  Nursing note and vitals reviewed.  Assessment & Plan:  1: Chronic heart failure with reduced ejection fraction- - NYHA Class III - euvolemic today - Scales given and instructed to weigh daily every morning after using the bathroom. To call for an overnight weight gain of >2 pounds or a weekly weight gain of >5 pounds -  Using Mrs. Dash, does use regular salt on popcorn (infrequent). Written dietary information was given - Will change his enalapril to entresto 24/19m twice daily. Patient has not taken enalapril today so he was advised to begin entresto this evening (this would be 36 hours after his enalapril dose yesterday morning) and then begin taking it twice daily tomorrow.  - since changing to entresto, also advised him to decrease his furosemide to 467mtwice daily (was taking 8080mM/92m13m). May be able to drop back to just 92mg2mly over time - Sees cardiologist (AridFletcher [name]6/17  2: HTN- - BP on the low side - Adding entresto but decreasing furosemide  3: Obstructive sleep apnea- - Seeing pulmonologist (RamaAshby Dawesay to discuss ordering a sleep study - Endorses snoring, + fatigue, + gasping in the night, + restless sleep  4: Diabetes-  - Last checked glucose a few days ago and it was "ok" - Encouraged daily check of his glucose - Taking glipizide 5mg d66my - Sees PCP 05/15/16  5: CVA - Walks with cane due to continued balance issues  Return here in 1 month or sooner for any questions/problems before then. Will check a basic  metabolic panel at that time.

## 2016-04-18 NOTE — Patient Instructions (Addendum)
--  Will send for in-lab sleep study.   --No napping during the day.

## 2016-04-18 NOTE — Patient Instructions (Addendum)
Begin weighing daily and call for an overnight weight gain of > 2 pounds or a weekly weight gain of >5 pounds.  Stop enalapril as you are beginning Entresto 24/26mg  twice daily. You will begin entresto 36 hours after you have taken your last dose of enalapril.  When you begin entresto, decrease furosemide to 40mg  twice daily (1 tablet morning and 1 tablet evening).   Will check lab work at your next visit.

## 2016-05-01 ENCOUNTER — Telehealth: Payer: Self-pay | Admitting: Family Medicine

## 2016-05-01 NOTE — Telephone Encounter (Signed)
Paperwork has been given to Dr. Kirtland Bouchard.

## 2016-05-01 NOTE — Telephone Encounter (Signed)
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  He needs the form to be filled out and faxed to [phone].  His court date is Nov 2nd.  Pt's call back number is [phone]

## 2016-05-08 ENCOUNTER — Other Ambulatory Visit (HOSPITAL_COMMUNITY): Payer: Medicaid Other

## 2016-05-12 ENCOUNTER — Telehealth: Payer: Self-pay | Admitting: Cardiovascular Disease

## 2016-05-12 ENCOUNTER — Ambulatory Visit: Payer: Medicaid Other | Admitting: Cardiovascular Disease

## 2016-05-12 NOTE — Telephone Encounter (Signed)
Pt calling stating since he woke up he's had a bad headache and dizzy spells Feels like the last time he had a stoke He can't make his appointment today doesn't think it would be safe to drive. He states he will call the ambulance if it gets worst

## 2016-05-12 NOTE — Progress Notes (Deleted)
Cardiology Office Note   Date:  05/12/2016   ID:  Kent Kindred., DOB 1971/06/04, MRN [id number]  PCP:  Leata Mouse, NP  Cardiologist:   Kathlyn Sacramento, MD   No chief complaint on file.     History of Present Illness: Kent Gettel. is a 45 y.o. male who presents for a follow-up visit regarding chronic systolic heart failure due to nonischemic cardiomyopathy. He was hospitalized at [facility] in April of 2014 with congestive heart failure. He was found to have an ejection fraction of less than 20%. He was treated medically with improvement in symptoms. He reports history of a seizure disorder since he was 59, hypertension and likely sleep apnea. He is obese and has chronic kidney disease. He quit smoking in 2014. He drinks red wine very rarely. He has used marijuana and cocaine in the past but none recently. Cardiac catheterization in March, 2016 showed minor luminal irregularities without evidence of obstructive coronary artery disease. Left ventricular angiography was not performed due to chronic kidney disease. Left ventricular end-diastolic pressure was 30 mmHg. He was noted to have significant apnea episodes during cardiac cath with bradycardia.  He was hospitalized in February 2017 with a stroke. He has been on Plavix for that indication. He lost to follow-up with me and was not seen for more than a year. He was hospitalized recently at [facility] due to acute on chronic systolic heart failure. He improved to diuresis with IV furosemide. He was started on small dose spironolactone. Echocardiogram showed an ejection fraction of 20-25%. The patient reports improvement in dyspnea and orthopnea with stable weight since hospital discharge. He has applied for disability and that has not been approved.[name] He does not have any extremity to get any additional medications.   Past Medical History:  Diagnosis Date  . Cerebellar Stroke    a. 08/2015 MRI: Acute/subacute L superior cerebellar  infarct. Remote R cerebellar infarct;  b. 08/2015 Carotid U/S: normal carotids.  . Chronic systolic heart failure ([place])    a. 10/2012 Echo: EF <20%, mod to sev dil LV, diast dysfxn, mildly dil LA/RA, mod to severe MR/TR.  . CKD (chronic kidney disease), stage II   . Coronary artery disease, non-occlusive    a. 09/2014 Cath: minor luminal irregularities with severely dilated left ventricle. LVEDP 30.  [name] H/O noncompliance with medical treatment, presenting hazards to health   . History of traumatic injury of head   . Hyperlipidemia   . Hypertension   . Morbid obesity ([place])   . NICM (nonischemic cardiomyopathy) ([place])    a. 10/2012 Echo: EF <20%.  . Polysubstance abuse    a. cocaine, tobacco, THC, ETOH  . Seizure disorder ([place])   . Stroke ([place])   . Type II diabetes mellitus ([place])     Past Surgical History:  Procedure Laterality Date  . CARDIAC CATHETERIZATION     [facility]     Current Outpatient Prescriptions  Medication Sig Dispense Refill  . albuterol (PROVENTIL HFA;VENTOLIN HFA) 108 (90 Base) MCG/ACT inhaler Inhale 2 puffs into the lungs every 6 (six) hours as needed for wheezing or shortness of breath. 1 Inhaler 11  . blood glucose meter kit and supplies KIT Dispense based on patient and insurance preference. Check blood glucose once daily. 1 each 0  . clopidogrel (PLAVIX) 75 MG tablet Take 1 tablet (75 mg total) by mouth daily. (Patient taking differently: Take 75 mg by mouth every morning. ) 30 tablet 11  .  furosemide (LASIX) 40 MG tablet TAKE ONE TABLET BY MOUTH TWICE DAILY (Patient taking differently: 2 tablets in the AM and 1 tablet in the PM) 60 tablet 3  . glipiZIDE (GLUCOTROL) 5 MG tablet Take 5 mg by mouth daily before breakfast.    . metoprolol succinate (TOPROL-XL) 25 MG 24 hr tablet Take 25 mg by mouth every morning.     . nitroGLYCERIN (NITROSTAT) 0.4 MG SL tablet Place 1 tablet (0.4 mg total) under the tongue every 5 (five) minutes as needed for chest pain. 30 tablet 0  .  pravastatin (PRAVACHOL) 80 MG tablet Take 1 tablet (80 mg total) by mouth daily at 6 PM. 30 tablet 6  . sacubitril-valsartan (ENTRESTO) 24-26 MG Take 1 tablet by mouth 2 (two) times daily. 60 tablet 3  . spironolactone (ALDACTONE) 25 MG tablet Take 0.5 tablets (12.5 mg total) by mouth daily. 30 tablet 0   No current facility-administered medications for this visit.     Allergies:   Tomato    Social History:  The patient  reports that he quit smoking about 9 months ago. His smoking use included Cigarettes. He quit after 10.00 years of use. He has never used smokeless tobacco. He reports that he does not drink alcohol or use drugs.   Family History:  The patient's family history includes Brain cancer in his mother; Diabetes in his father and mother; Heart attack in his father and mother; Heart disease in his father; Heart failure in his father, maternal aunt, and mother; Hyperlipidemia in his father; Hypertension in his father; Sleep apnea in his brother, father, mother, and sister.    ROS:  Please see the history of present illness.   Otherwise, review of systems are positive for none.   All other systems are reviewed and negative.    PHYSICAL EXAM: VS:  There were no vitals taken for this visit. , BMI There is no height or weight on file to calculate BMI. GEN: Well nourished, well developed, in no acute distress  HEENT: normal  Neck: no JVD, carotid bruits, or masses Cardiac: RRR; no murmurs, rubs, or gallops,no edema  Respiratory:  clear to auscultation bilaterally, normal work of breathing GI: soft, nontender, nondistended, + BS MS: no deformity or atrophy  Skin: warm and dry, no rash Neuro:  Strength and sensation are intact Psych: euthymic mood, full affect   EKG:  EKG is ordered today. The ekg ordered today demonstrates normal sinus rhythm, incomplete left bundle branch block with QRS duration of 100 ms   Recent Labs: 02/12/2016: Brain Natriuretic Peptide 403.8 03/27/2016: ALT  24 03/28/2016: Hemoglobin 14.8; Platelets 152 04/08/2016: BUN 18; Creatinine, Ser 1.62; Potassium 3.9; Sodium 140    Lipid Panel    Component Value Date/Time   CHOL 127 02/12/2016 1404   CHOL 183 10/14/2012 0354   TRIG 108 02/12/2016 1404   TRIG 79 10/14/2012 0354   HDL 28 (L) 02/12/2016 1404   HDL 44 10/14/2012 0354   CHOLHDL 4.5 02/12/2016 1404   VLDL 22 02/12/2016 1404   VLDL 16 10/14/2012 0354   LDLCALC 77 02/12/2016 1404   LDLCALC 123 (H) 10/14/2012 0354      Wt Readings from Last 3 Encounters:  04/18/16 (!) 325 lb (147.4 kg)  04/18/16 (!) 326 lb (147.9 kg)  04/10/16 (!) 325 lb (147.4 kg)       PAD Screen 04/08/2016  Previous PAD dx? No  Previous surgical procedure? No  Pain with walking? Yes  Subsides with rest?  Yes  Feet/toe relief with dangling? No  Painful, non-healing ulcers? No  Extremities discolored? No      ASSESSMENT AND PLAN:  1.  Chronic systolic heart failure: Due to nonischemic cardiomyopathy. Currently New York Heart Association class III. He appears to be euvolemic. Given that we recently started spironolactone, I requested basic metabolic profile. His blood pressure is low and thus I am not able to increase his heart failure medications. He continues to have sinus tachycardia and I think he would benefit from treatment with Ivabradine. However, he reports inability to afford any new medication. His QRS is not wide enough for CRT. I discussed with him the importance of low sodium diet.  2. Probable sleep apnea: Based on his symptoms. I think this is significantly affecting his cardiac status. I referred him to pulmonary for evaluation.  3. Previous stroke: Currently on Plavix.    Disposition:   FU with me in 1 month  Signed,  Kathlyn Sacramento, MD  05/12/2016 12:02 PM    [place]

## 2016-05-12 NOTE — Telephone Encounter (Addendum)
S/w pt who reports severe HA Saturday and upon wakening at 8:10am today. Pain 6/10 and he is sensitive to light.  He took 2 tylenol w/ "some" improvement. Tylenol normally alleviates pain. When he has HA, he will sleep all day. Pt states these are similar sx as when he has CVA Feb 2017. Denies hx of migraines but has had headaches since his stroke.  "This happens every now and then but not back-to-back like this".  He is light-headed and dizzy and had difficulty swallowing water 10-15 minutes ago, but did not get strangled. I asked him to take a drink of water while we are on the phone. He did so w/out difficulty. Reviewed FAST for which the patient denies any sx. He has not taken BP today and states cuff is upstairs. He lives w/a friend with his BR upstairs. He comes downstairs once/day w/everything he needs for the day d/t difficulty w/the 13 steps. He does not want to go back for the BP cuff.  He walks w/cane d/t left-sided weakness.  Pt feels he needs to be seen but should not drive d/t sx. He called his sister who is a Engineer, civil (consulting). While we were on the phone, his sister called back to report she is on her way over to take him to the ER. Advised pt that if sx worsen before sister arrives, he should call 911. Pt verbalized understanding w/no further questions at this time.

## 2016-05-15 ENCOUNTER — Ambulatory Visit (INDEPENDENT_AMBULATORY_CARE_PROVIDER_SITE_OTHER): Payer: Medicaid Other | Admitting: Family Medicine

## 2016-05-15 ENCOUNTER — Encounter: Payer: Self-pay | Admitting: Family Medicine

## 2016-05-15 VITALS — BP 119/85 | HR 85 | Temp 97.9°F | Resp 16 | Ht 72.0 in | Wt 329.0 lb

## 2016-05-15 DIAGNOSIS — N183 Chronic kidney disease, stage 3 unspecified: Secondary | ICD-10-CM | POA: Insufficient documentation

## 2016-05-15 DIAGNOSIS — E1122 Type 2 diabetes mellitus with diabetic chronic kidney disease: Secondary | ICD-10-CM | POA: Diagnosis not present

## 2016-05-15 DIAGNOSIS — E118 Type 2 diabetes mellitus with unspecified complications: Secondary | ICD-10-CM | POA: Diagnosis not present

## 2016-05-15 LAB — HEMOGLOBIN A1C
HEMOGLOBIN A1C: 8.8 % — AB (ref <5.7)
MEAN PLASMA GLUCOSE: 206 mg/dL

## 2016-05-15 NOTE — Patient Instructions (Addendum)
Thank you for coming in to clinic today.  1.  Elevated A1c today to 8.9%, we will double check with blood draw and be in touch.  Medicines that we would consider include Victoza or Bydureon, injectable. We will discuss this further if needed.  Eat at least 3 meals and 1-2 snacks per day (don't skip breakfast).  Aim for no more than 5 hours between eating. - Tip: If you go >5 hours without eating and become very hungry, your body will supply it's own resources temporarily and you can gain extra weight when you eat.   The 5 Minute Rule of Exercise - Promise yourself to at least do 5 minutes of exercise (make sure you time it), and if at the end of 5 minutes (this is the hardest part of the work-out), if you still feel like you want stop (or not motivated to continue) then allow yourself to stop. Otherwise, more often than not you will feel encouraged that you can continue for a little while longer or even more!  Diet Recommendations for Diabetes   Starchy (carb) foods include: Bread, rice, pasta, potatoes, corn, crackers, bagels, muffins, all baked goods.   Protein foods include: Meat, fish, poultry, eggs, dairy foods, and beans such as pinto and kidney beans (beans also provide carbohydrate).   1. Eat at least 3 meals and 1-2 snacks per day. Never go more than 4-5 hours while awake without eating.   2. Limit starchy foods to TWO per meal and ONE per snack. ONE portion of a starchy  food is equal to the following:   - ONE slice of bread (or its equivalent, such as half of a hamburger bun).   - 1/2 cup of a "scoopable" starchy food such as potatoes or rice.   - 1 OUNCE (28 grams) of starchy snacks (crackers or pretzels, look on label).   - 15 grams of carbohydrate as shown on food label.   3. Both lunch and dinner should include a protein food, a carb food, and vegetables.   - Obtain twice as many veg's as protein or carbohydrate foods for both lunch and dinner.   - Try to keep frozen  veg's on hand for a quick vegetable serving.     - Fresh or frozen veg's are best.   4. Breakfast should always include protein.     Please schedule a follow-up appointment with Dr. Althea Charon in 3 months for Diabetes A1c follow-up  If you have any other questions or concerns, please feel free to call the clinic or send a message through MyChart. You may also schedule an earlier appointment if necessary.  Saralyn Pilar, DO [facility], [place]

## 2016-05-15 NOTE — Assessment & Plan Note (Signed)
Previously well controlled, A1c acutely elevated 8.9, however concern with accuracy of POC A1c machine in office today. - Without CBG log, difficult to assess progress - Not candidate for Metformin with CKD / CHF  Plan: 1. Re-check serum A1c to confirm accuracy 2. Currently continue Glipzide 5mg , without hypoglycemia, can titrate dose vs add other agent if actually increased A1c, would strongly consider GLP1 as best option, but potential SGLT2 would need to monitor renal function 3. Follow-up within 1-3 months based on A1c, discussion of additional agent

## 2016-05-15 NOTE — Assessment & Plan Note (Addendum)
Recent wt gain, concern with multiple co morbidities Encouraged improve DM diet for wt loss, start regular exercise home exercise bike - Consider referral to [facility]

## 2016-05-15 NOTE — Progress Notes (Signed)
Subjective:    Patient ID: Kent Geralds., male    DOB: October 15, 1970, 45 y.o.   MRN: [id number]  Kent Bertani. is a 45 y.o. male presenting on 05/15/2016 for Follow-up (Diabetes)   HPI   CHRONIC DM, Type 2: Reports he recently ran out of test strips, uses OTC [employer] glucometer. CBGs: Does not recall avg sugars and does not have CBG log today. Checks CBGs 1x daily (usually 1-2 hour after eating) Meds: Glipzide 5mg  daily, never on metformin due to kidney function and CHF Reports good compliance. Tolerating well w/o side-effects Currently on ARB Lifestyle: Diet (irregular diet with sometimes doesn't eat before 1200, due to not hungrydrinks sweet tea, does not eat no sugary snacks, drinks a lot of water) / Exercise (no regular, limited by other medical problems, but wants to start with exercise bike at home recently set up) Denies hypoglycemia, polyuria, visual changes, numbness or tingling.  Additional history: Awaiting social security medical disability s/p CVA  Social History  Substance Use Topics  . Smoking status: Former Smoker    Years: 10.00    Types: Cigarettes    Quit date: 08/07/2015  . Smokeless tobacco: Never Used  . Alcohol use No     Comment: socially    Review of Systems Per HPI unless specifically indicated above     Objective:    BP 119/85 (BP Location: Right Arm, Patient Position: Sitting, Cuff Size: Large)   Pulse 85   Temp 97.9 F (36.6 C) (Oral)   Resp 16   Ht 6' (1.829 m)   Wt (!) 329 lb (149.2 kg)   BMI 44.62 kg/m   Wt Readings from Last 3 Encounters:  05/15/16 (!) 329 lb (149.2 kg)  04/18/16 (!) 325 lb (147.4 kg)  04/18/16 (!) 326 lb (147.9 kg)    Physical Exam  Constitutional: He appears well-developed and well-nourished. No distress.  Obese, chronically ill appearing but currently well, comfortable, cooperative, cane for ambulation  HENT:  Head: Normocephalic and atraumatic.  Mouth/Throat: Oropharynx is clear and moist.    Cardiovascular: Normal rate and intact distal pulses.   Pulmonary/Chest: Effort normal.  Musculoskeletal: He exhibits no edema.  Neurological: He is alert.  Skin: Skin is warm and dry. No rash noted. He is not diaphoretic.  Nursing note and vitals reviewed.  DM FOOT EXAM - normal appearance, no lesions, calluses, or ulcers, intact sensation to monofilament bilaterally   I have personally reviewed the following lab results from 04/08/16.  Results for orders placed or performed in visit on 04/08/16  Basic Metabolic Panel (BMET)  Result Value Ref Range   Glucose 106 (H) 65 - 99 mg/dL   BUN 18 6 - 24 mg/dL   Creatinine, Ser 3.57 (H) 0.76 - 1.27 mg/dL   GFR calc non Af Amer 50 (L) >59 mL/min/1.73   GFR calc Af Amer 58 (L) >59 mL/min/1.73   BUN/Creatinine Ratio 11 9 - 20   Sodium 140 134 - 144 mmol/L   Potassium 3.9 3.5 - 5.2 mmol/L   Chloride 98 96 - 106 mmol/L   CO2 25 18 - 29 mmol/L   Calcium 9.5 8.7 - 10.2 mg/dL      Assessment & Plan:   Problem List Items Addressed This Visit    Morbid obesity (HCC)    Recent wt gain, concern with multiple co morbidities Encouraged improve DM diet for wt loss, start regular exercise home exercise bike - Consider referral to [facility]  Diabetes mellitus type 2, controlled, with complications (HCC) - Primary    Previously well controlled, A1c acutely elevated 8.9, however concern with accuracy of POC A1c machine in office today. - Without CBG log, difficult to assess progress - Not candidate for Metformin with CKD / CHF  Plan: 1. Re-check serum A1c to confirm accuracy 2. Currently continue Glipzide 5mg , without hypoglycemia, can titrate dose vs add other agent if actually increased A1c, would strongly consider GLP1 as best option, but potential SGLT2 would need to monitor renal function 3. Follow-up within 1-3 months based on A1c, discussion of additional agent      Relevant Orders   POCT glycosylated hemoglobin (Hb A1C)    HgB A1c   CKD stage 3 secondary to diabetes (HCC)    Recent worsening CKD in setting of DM, HTN. Concern with HFrEF as co morbidity.  Plan: 1. Follow-up chemistry trend Cr 2. Optimize DM / HTN to avoid worsening CKD 3. Anticipate patient will likely need to establish with Nephrology         No orders of the defined types were placed in this encounter.     Follow up plan: Return in about 4 weeks (around 06/12/2016) for diabetes.  Saralyn PilarAlexander Alrick Cubbage, DO [facility] 05/15/2016, 9:07 PM

## 2016-05-15 NOTE — Assessment & Plan Note (Signed)
Recent worsening CKD in setting of DM, HTN. Concern with HFrEF as co morbidity.  Plan: 1. Follow-up chemistry trend Cr 2. Optimize DM / HTN to avoid worsening CKD 3. Anticipate patient will likely need to establish with Nephrology

## 2016-05-19 ENCOUNTER — Telehealth: Payer: Self-pay | Admitting: Family

## 2016-05-19 ENCOUNTER — Ambulatory Visit: Payer: Medicaid Other | Attending: Family | Admitting: Family

## 2016-05-19 VITALS — BP 119/73 | HR 86 | Resp 18 | Ht 72.0 in | Wt 331.0 lb

## 2016-05-19 DIAGNOSIS — E1122 Type 2 diabetes mellitus with diabetic chronic kidney disease: Secondary | ICD-10-CM | POA: Diagnosis not present

## 2016-05-19 DIAGNOSIS — I130 Hypertensive heart and chronic kidney disease with heart failure and stage 1 through stage 4 chronic kidney disease, or unspecified chronic kidney disease: Secondary | ICD-10-CM | POA: Diagnosis present

## 2016-05-19 DIAGNOSIS — G4733 Obstructive sleep apnea (adult) (pediatric): Secondary | ICD-10-CM | POA: Insufficient documentation

## 2016-05-19 DIAGNOSIS — Z79899 Other long term (current) drug therapy: Secondary | ICD-10-CM | POA: Diagnosis not present

## 2016-05-19 DIAGNOSIS — I639 Cerebral infarction, unspecified: Secondary | ICD-10-CM | POA: Insufficient documentation

## 2016-05-19 DIAGNOSIS — I63542 Cerebral infarction due to unspecified occlusion or stenosis of left cerebellar artery: Secondary | ICD-10-CM

## 2016-05-19 DIAGNOSIS — N182 Chronic kidney disease, stage 2 (mild): Secondary | ICD-10-CM | POA: Diagnosis not present

## 2016-05-19 DIAGNOSIS — Z87891 Personal history of nicotine dependence: Secondary | ICD-10-CM | POA: Diagnosis not present

## 2016-05-19 DIAGNOSIS — I10 Essential (primary) hypertension: Secondary | ICD-10-CM

## 2016-05-19 DIAGNOSIS — I5022 Chronic systolic (congestive) heart failure: Secondary | ICD-10-CM | POA: Diagnosis present

## 2016-05-19 DIAGNOSIS — E118 Type 2 diabetes mellitus with unspecified complications: Secondary | ICD-10-CM

## 2016-05-19 LAB — BASIC METABOLIC PANEL
Anion gap: 6 (ref 5–15)
BUN: 14 mg/dL (ref 6–20)
CHLORIDE: 103 mmol/L (ref 101–111)
CO2: 28 mmol/L (ref 22–32)
Calcium: 8.8 mg/dL — ABNORMAL LOW (ref 8.9–10.3)
Creatinine, Ser: 1.44 mg/dL — ABNORMAL HIGH (ref 0.61–1.24)
GFR calc Af Amer: >60 mL/min (ref >60)
GFR calc non Af Amer: 57 mL/min — ABNORMAL LOW (ref >60)
GLUCOSE: 150 mg/dL — AB (ref 65–99)
POTASSIUM: 3.9 mmol/L (ref 3.5–5.1)
Sodium: 137 mmol/L (ref 135–145)

## 2016-05-19 MED ORDER — SACUBITRIL-VALSARTAN 49-51 MG PO TABS: 1.0000 | ORAL_TABLET | Freq: Two times a day (BID) | ORAL | 5 refills | Status: DC

## 2016-05-19 NOTE — Patient Instructions (Signed)
Continue weighing daily and call for an overnight weight gain of > 2 pounds or a weekly weight gain of >5 pounds. 

## 2016-05-19 NOTE — Progress Notes (Signed)
Patient ID: Kent Kindred., male    DOB: 1971/06/24, 45 y.o.   MRN: [id number]  HPI  Kent Stanley is a 45 y/o male with a history of recent stroke (Feb 2017), CKD stage 2, CAD, hyperlipidemia, HTN, morbid obesity, polysubstance abuse in the past, prior tobacco use, seizures, DM and chronic heart failure.  Last echo was done 03/28/16 which showed an EF of 20-25% without regurgitation. Last catheterization was done March 2016.  Admitted to [facility] on 03/27/16 with heart failure exacerbation. Was treated with IV diuretics and spironolactone was added. Discussion about possible ICD in the future if the EF remains low.   He presents today for his follow-up visit with fatigue and shortness of breath with minimal exertion. Is weighing himself daily and says that he's had a gradual weight gain. No edema to report. Difficulty sleeping as he's waking up every few hours. + snoring. Reports his A1c was elevated and admits that he's been drinking sweet tea.  Does walk with a cane due to continued balance issues after his stroke. Says that he's had less chest pain since beginning entresto.   Past Medical History:  Diagnosis Date  . Cerebellar Stroke    a. 08/2015 MRI: Acute/subacute L superior cerebellar infarct. Remote R cerebellar infarct;  b. 08/2015 Carotid U/S: normal carotids.  . Chronic systolic heart failure ([place])    a. 10/2012 Echo: EF <20%, mod to sev dil LV, diast dysfxn, mildly dil LA/RA, mod to severe Kent/TR.  . CKD (chronic kidney disease), stage II   . Coronary artery disease, non-occlusive    a. 09/2014 Cath: minor luminal irregularities with severely dilated left ventricle. LVEDP 30.  [name] H/O noncompliance with medical treatment, presenting hazards to health   . History of traumatic injury of head   . Hyperlipidemia   . Hypertension   . Morbid obesity ([place])   . NICM (nonischemic cardiomyopathy) ([place])    a. 10/2012 Echo: EF <20%.  . Polysubstance abuse    a. cocaine, tobacco, THC, ETOH  .  Seizure disorder ([place])   . Stroke ([place])   . Type II diabetes mellitus ([place])     Past Surgical History:  Procedure Laterality Date  . CARDIAC CATHETERIZATION     [facility]    Family History  Problem Relation Age of Onset  . Diabetes Mother   . Brain cancer Mother   . Heart attack Mother   . Sleep apnea Mother   . Heart failure Mother   . Hypertension Father   . Diabetes Father   . Heart disease Father   . Hyperlipidemia Father   . Heart attack Father   . Heart failure Father   . Sleep apnea Father   . Sleep apnea Sister   . Sleep apnea Brother   . Heart failure Maternal Aunt     Social History  Substance Use Topics  . Smoking status: Former Smoker    Years: 10.00    Types: Cigarettes    Quit date: 08/07/2015  . Smokeless tobacco: Never Used  . Alcohol use No     Comment: socially    Allergies  Allergen Reactions  . Tomato Rash    Prior to Admission medications   Medication Sig Start Date End Date Taking? Authorizing Provider  albuterol (PROVENTIL HFA;VENTOLIN HFA) 108 (90 Base) MCG/ACT inhaler Inhale 2 puffs into the lungs every 6 (six) hours as needed for wheezing or shortness of breath. 03/28/16  Yes Amy Overton Mam, NP  blood glucose  meter kit and supplies KIT Dispense based on patient and insurance preference. Check blood glucose once daily. 08/21/15  Yes Amy Overton Mam, NP  clopidogrel (PLAVIX) 75 MG tablet Take 1 tablet (75 mg total) by mouth daily. Patient taking differently: Take 75 mg by mouth every morning.  02/12/16  Yes Amy Overton Mam, NP  furosemide (LASIX) 40 MG tablet TAKE ONE TABLET BY MOUTH TWICE DAILY Patient taking differently: 1 tablet in the AM and one in the PM 07/03/15  Yes Wellington Hampshire, MD  glipiZIDE (GLUCOTROL) 5 MG tablet Take 10 mg by mouth daily before breakfast.    Yes Historical Provider, MD  metoprolol succinate (TOPROL-XL) 25 MG 24 hr tablet Take 25 mg by mouth every morning.    Yes Historical Provider, MD  nitroGLYCERIN  (NITROSTAT) 0.4 MG SL tablet Place 1 tablet (0.4 mg total) under the tongue every 5 (five) minutes as needed for chest pain. 03/28/16  Yes Loletha Grayer, MD  pravastatin (PRAVACHOL) 80 MG tablet Take 1 tablet (80 mg total) by mouth daily at 6 PM. 09/12/15  Yes Amy Overton Mam, NP  spironolactone (ALDACTONE) 25 MG tablet Take 0.5 tablets (12.5 mg total) by mouth daily. 03/29/16  Yes Richard Leslye Peer, MD  sacubitril-valsartan Delene Loll) 24/'26mg'$  Take 1 tablet by mouth 2 (two) times daily. 05/19/16   Alisa Graff, FNP     Review of Systems  Constitutional: Positive for fatigue. Negative for appetite change.  HENT: Negative for congestion, rhinorrhea and sore throat.   Eyes: Negative.   Respiratory: Positive for shortness of breath. Negative for cough and chest tightness.   Cardiovascular: Positive for chest pain (less since beginning entresto). Negative for palpitations and leg swelling.  Gastrointestinal: Negative for abdominal distention and abdominal pain.  Endocrine: Negative.   Genitourinary: Negative.   Musculoskeletal: Negative for back pain and myalgias.  Skin: Negative.   Allergic/Immunologic: Negative.   Neurological: Positive for dizziness, weakness (left leg at times), light-headedness and headaches (recently).  Hematological: Negative for adenopathy. Does not bruise/bleed easily.  Psychiatric/Behavioral: Positive for sleep disturbance (sleep study tomorrow night). Negative for dysphoric mood. The patient is not nervous/anxious.    Vitals:   05/19/16 1043  BP: 119/73  Pulse: 86  Resp: 18  SpO2: 94%  Weight: (!) 331 lb (150.1 kg)  Height: 6' (1.829 m)   Wt Readings from Last 3 Encounters:  05/19/16 (!) 331 lb (150.1 kg)  05/15/16 (!) 329 lb (149.2 kg)  04/18/16 (!) 325 lb (147.4 kg)   Lab Results  Component Value Date   CREATININE 1.44 (H) 05/19/2016   CREATININE 1.62 (H) 04/08/2016   CREATININE 1.55 (H) 03/28/2016    Physical Exam  Constitutional: He is oriented  to person, place, and time. He appears well-developed and well-nourished.  HENT:  Head: Normocephalic and atraumatic.  Eyes: Conjunctivae are normal. Pupils are equal, round, and reactive to light.  Neck: Normal range of motion. Neck supple.  Cardiovascular: Normal rate and regular rhythm.   Pulmonary/Chest: Effort normal. He has no wheezes. He has no rales.  Abdominal: Soft. He exhibits no distension. There is no tenderness.  Musculoskeletal: He exhibits no edema or tenderness.  Neurological: He is alert and oriented to person, place, and time.  Skin: Skin is warm and dry.  Psychiatric: He has a normal mood and affect. His behavior is normal. Thought content normal.  Nursing note and vitals reviewed.    Assessment & Plan:  1: Chronic heart failure with reduced ejection fraction- - NYHA  Class II - euvolemic today - weight up 5 pounds since last here on 04/18/16. To call for an overnight weight gain of >2 pounds or a weekly weight gain of >5 pounds - Using Mrs. Dash, does use regular salt on popcorn (infrequent). Written dietary information was given - tolerating entresto well and reports less chest pain since beginning medication. Will plan on titrating up dose to 49/'51mg'$  BID after getting lab results back - no signs of fluid overload since decreasing furosemide to '40mg'$  BID. If able to titrate up entresto, will decrease furosemide even more to '40mg'$  AM & '20mg'$  PM - check BMP today and will call patient with results - Sees cardiologist Fletcher [name]) 05/27/16  2: HTN- - BP looks good today - not much difference in BP since beginning entresto  3: Obstructive sleep apnea- - sleep study scheduled for 05/20/16 - Endorses snoring, + fatigue, + gasping in the night, + restless sleep  4: Diabetes-  - last A1c 8.8% on 05/15/16 - glipizide increased to '10mg'$  daily by PCP - PCP to make nephrology referral for patient  5: CVA - Walks with cane due to continued balance issues  Patient will be  travelling to [facility] via airplane on 06/24/16 and staying for a couple of weeks. Will have patient return here in 1 month or sooner for any questions/problems before then. Will recheck a BMP at that time if entresto is titrated up.

## 2016-05-19 NOTE — Telephone Encounter (Signed)
Spoke with patient regarding lab work that was drawn today (05/19/16). Informed him that his potassium and sodium level were normal and that his renal function had improved from one month ago.   Will go ahead and increase his entresto to 49/51mg  twice daily and decrease his furosemide to 40mg  AM and 20mg  PM. (He was previously taking 40mg  BID). Did tell patient that should he develop edema or have an overnight weight gain of >2 pounds or a weekly weight gain of >5 pounds that he could take an additional 20mg  furosemide.   Will plan on rechecking his lab work at his next appointment in 1 month.

## 2016-05-20 ENCOUNTER — Ambulatory Visit: Payer: Medicaid Other | Attending: Internal Medicine

## 2016-05-20 ENCOUNTER — Encounter: Payer: Self-pay | Admitting: Family

## 2016-05-20 DIAGNOSIS — G4710 Hypersomnia, unspecified: Secondary | ICD-10-CM | POA: Diagnosis present

## 2016-05-20 DIAGNOSIS — I509 Heart failure, unspecified: Secondary | ICD-10-CM | POA: Diagnosis present

## 2016-05-20 DIAGNOSIS — E118 Type 2 diabetes mellitus with unspecified complications: Secondary | ICD-10-CM | POA: Insufficient documentation

## 2016-05-20 DIAGNOSIS — G4719 Other hypersomnia: Secondary | ICD-10-CM

## 2016-05-22 ENCOUNTER — Other Ambulatory Visit: Payer: Self-pay | Admitting: Family Medicine

## 2016-05-23 DIAGNOSIS — G4733 Obstructive sleep apnea (adult) (pediatric): Secondary | ICD-10-CM | POA: Diagnosis not present

## 2016-05-26 ENCOUNTER — Telehealth: Payer: Self-pay | Admitting: Family Medicine

## 2016-05-26 NOTE — Telephone Encounter (Signed)
Pt needs referral to neuro for memory loss due to stroke.  I scheduled an appt to see Dr. Kirtland Bouchard on Wednesday.  If he does not need to be seen his call back number is [phone]

## 2016-05-26 NOTE — Telephone Encounter (Signed)
Patient to be seen on Wed for referral to neuro.

## 2016-05-27 ENCOUNTER — Ambulatory Visit (INDEPENDENT_AMBULATORY_CARE_PROVIDER_SITE_OTHER): Payer: Medicaid Other | Admitting: Cardiovascular Disease

## 2016-05-27 ENCOUNTER — Telehealth: Payer: Self-pay | Admitting: *Deleted

## 2016-05-27 ENCOUNTER — Encounter: Payer: Self-pay | Admitting: Cardiovascular Disease

## 2016-05-27 VITALS — BP 118/80 | HR 93 | Ht 72.0 in | Wt 332.0 lb

## 2016-05-27 DIAGNOSIS — G4733 Obstructive sleep apnea (adult) (pediatric): Secondary | ICD-10-CM

## 2016-05-27 DIAGNOSIS — I5022 Chronic systolic (congestive) heart failure: Secondary | ICD-10-CM | POA: Diagnosis not present

## 2016-05-27 MED ORDER — SPIRONOLACTONE 25 MG PO TABS: 12.5000 mg | ORAL_TABLET | Freq: Every day | ORAL | 6 refills | Status: DC

## 2016-05-27 NOTE — Telephone Encounter (Signed)
-----   Message from Shane Crutch, MD sent at 05/23/2016  1:56 PM EST ----- Regarding: PSG results Sleep study showed severe OSA with AHI of 76.   Needs IN-LAB Sleep titration study due to presence of ischemic cardiomyopathy, diabetes, hypertension.

## 2016-05-27 NOTE — Progress Notes (Signed)
Cardiology Office Note   Date:  05/27/2016   ID:  Kent Kindred., DOB 1971-03-03, MRN [id number]  PCP:  Leata Mouse, NP  Cardiologist:   Kathlyn Sacramento, MD   Chief Complaint  Patient presents with  . other    1 month follow up. Meds reviewed by the pt. verbally. "doing well."       History of Present Illness: Kent Stanley. is a 45 y.o. male who presents for a follow-up visit regarding chronic systolic heart failure due to nonischemic cardiomyopathy.  He was diagnosed with systolic heart failure in 2014 with initial ejection fraction less than 20%.   He reports history of a seizure disorder since he was 27, hypertension, chronic kidney disease, morbid obesity and recently diagnosed severe sleep apnea.  He quit smoking in 2014. He drinks red wine very rarely. He has used marijuana and cocaine in the past but none recently. Cardiac catheterization in March, 2016 showed minor luminal irregularities without evidence of obstructive coronary artery disease.Left ventricular end-diastolic pressure was 30 mmHg. He was noted to have significant apnea episodes during cardiac cath with bradycardia.  He was hospitalized in February 2017 with a stroke. He has been on Plavix for that indication.  He was recently approved for Medicaid. He has been doing well and has been taking his medications regularly with significant improvement in shortness of breath. He reports improvement in chest pain since he was started on Entresto.  He had recent sleep study which confirmed severe sleep apnea and he is going for CPAP titration.  Past Medical History:  Diagnosis Date  . Cerebellar Stroke    a. 08/2015 MRI: Acute/subacute L superior cerebellar infarct. Remote R cerebellar infarct;  b. 08/2015 Carotid U/S: normal carotids.  . Chronic systolic heart failure ([place])    a. 10/2012 Echo: EF <20%, mod to sev dil LV, diast dysfxn, mildly dil LA/RA, mod to severe MR/TR.  . CKD (chronic kidney disease),  stage II   . Coronary artery disease, non-occlusive    a. 09/2014 Cath: minor luminal irregularities with severely dilated left ventricle. LVEDP 30.  [name] H/O noncompliance with medical treatment, presenting hazards to health   . History of traumatic injury of head   . Hyperlipidemia   . Hypertension   . Morbid obesity ([place])   . NICM (nonischemic cardiomyopathy) ([place])    a. 10/2012 Echo: EF <20%.  . Polysubstance abuse    a. cocaine, tobacco, THC, ETOH  . Seizure disorder ([place])   . Stroke ([place])   . Type II diabetes mellitus ([place])     Past Surgical History:  Procedure Laterality Date  . CARDIAC CATHETERIZATION     [facility]     Current Outpatient Prescriptions  Medication Sig Dispense Refill  . albuterol (PROVENTIL HFA;VENTOLIN HFA) 108 (90 Base) MCG/ACT inhaler Inhale 2 puffs into the lungs every 6 (six) hours as needed for wheezing or shortness of breath. 1 Inhaler 11  . blood glucose meter kit and supplies KIT Dispense based on patient and insurance preference. Check blood glucose once daily. 1 each 0  . clopidogrel (PLAVIX) 75 MG tablet Take 1 tablet (75 mg total) by mouth daily. (Patient taking differently: Take 75 mg by mouth every morning. ) 30 tablet 11  . furosemide (LASIX) 40 MG tablet TAKE ONE TABLET BY MOUTH TWICE DAILY (Patient taking differently: 1 tablet in the AM and one in the PM) 60 tablet 3  . glipiZIDE (GLUCOTROL) 5 MG tablet Take  10 mg by mouth daily before breakfast.     . metoprolol succinate (TOPROL-XL) 25 MG 24 hr tablet Take 25 mg by mouth every morning.     . nitroGLYCERIN (NITROSTAT) 0.4 MG SL tablet Place 1 tablet (0.4 mg total) under the tongue every 5 (five) minutes as needed for chest pain. 30 tablet 0  . pravastatin (PRAVACHOL) 80 MG tablet Take 1 tablet (80 mg total) by mouth daily at 6 PM. 30 tablet 6  . sacubitril-valsartan (ENTRESTO) 49-51 MG Take 1 tablet by mouth 2 (two) times daily. 60 tablet 5  . spironolactone (ALDACTONE) 25 MG tablet Take 0.5 tablets  (12.5 mg total) by mouth daily. 30 tablet 0   No current facility-administered medications for this visit.     Allergies:   Tomato    Social History:  The patient  reports that he quit smoking about 9 months ago. His smoking use included Cigarettes. He quit after 10.00 years of use. He has never used smokeless tobacco. He reports that he does not drink alcohol or use drugs.   Family History:  The patient's family history includes Brain cancer in his mother; Diabetes in his father and mother; Heart attack in his father and mother; Heart disease in his father; Heart failure in his father, maternal aunt, and mother; Hyperlipidemia in his father; Hypertension in his father; Sleep apnea in his brother, father, mother, and sister.    ROS:  Please see the history of present illness.   Otherwise, review of systems are positive for none.   All other systems are reviewed and negative.    PHYSICAL EXAM: VS:  BP 118/80 (BP Location: Right Wrist, Patient Position: Sitting, Cuff Size: Normal)   Pulse 93   Ht 6' (1.829 m)   Wt (!) 332 lb (150.6 kg)   BMI 45.03 kg/m  , BMI Body mass index is 45.03 kg/m. GEN: Well nourished, well developed, in no acute distress  HEENT: normal  Neck: no JVD, carotid bruits, or masses Cardiac: RRR; no murmurs, rubs, or gallops,no edema  Respiratory:  clear to auscultation bilaterally, normal work of breathing GI: soft, nontender, nondistended, + BS MS: no deformity or atrophy  Skin: warm and dry, no rash Neuro:  Strength and sensation are intact Psych: euthymic mood, full affect   EKG:  EKG is not ordered today.   Recent Labs: 02/12/2016: Brain Natriuretic Peptide 403.8 03/27/2016: ALT 24 03/28/2016: Hemoglobin 14.8; Platelets 152 05/19/2016: BUN 14; Creatinine, Ser 1.44; Potassium 3.9; Sodium 137    Lipid Panel    Component Value Date/Time   CHOL 127 02/12/2016 1404   CHOL 183 10/14/2012 0354   TRIG 108 02/12/2016 1404   TRIG 79 10/14/2012 0354   HDL  28 (L) 02/12/2016 1404   HDL 44 10/14/2012 0354   CHOLHDL 4.5 02/12/2016 1404   VLDL 22 02/12/2016 1404   VLDL 16 10/14/2012 0354   LDLCALC 77 02/12/2016 1404   LDLCALC 123 (H) 10/14/2012 0354      Wt Readings from Last 3 Encounters:  05/27/16 (!) 332 lb (150.6 kg)  05/19/16 (!) 331 lb (150.1 kg)  05/15/16 (!) 329 lb (149.2 kg)       PAD Screen 04/08/2016  Previous PAD dx? No  Previous surgical procedure? No  Pain with walking? Yes  Subsides with rest? Yes  Feet/toe relief with dangling? No  Painful, non-healing ulcers? No  Extremities discolored? No      ASSESSMENT AND PLAN:  1.  Chronic systolic heart failure:  Due to nonischemic cardiomyopathy. Currently New York Heart Association class III. He appears to be euvolemic.  He is currently on optimal medical therapy. I refilled spironolactone. Recent labs showed stable chronic kidney disease and no evidence of hyperkalemia.  2. Severe sleep apnea: I advised him to follow-up with CPAP titration.  3. Previous stroke: Currently on Plavix.    Disposition:   FU with me in 4 months  Signed,  Kathlyn Sacramento, MD  05/27/2016 2:59 PM    [place]

## 2016-05-27 NOTE — Telephone Encounter (Signed)
Pt informed of sleep results and that we are placing order for CPAP titration. Order placed. Nothing further needed.

## 2016-05-27 NOTE — Patient Instructions (Signed)
Medication Instructions:  Your physician recommends that you continue on your current medications as directed. Please refer to the Current Medication list given to you today.   Labwork: none  Testing/Procedures: none  Follow-Up: Your physician wants you to follow-up in: 4 months with Dr. Arida.  You will receive a reminder letter in the mail two months in advance. If you don't receive a letter, please call our office to schedule the follow-up appointment.   Any Other Special Instructions Will Be Listed Below (If Applicable).     If you need a refill on your cardiac medications before your next appointment, please call your pharmacy.   

## 2016-05-28 ENCOUNTER — Ambulatory Visit: Payer: Medicaid Other | Admitting: Family Medicine

## 2016-06-05 ENCOUNTER — Ambulatory Visit: Payer: Medicaid Other | Attending: Internal Medicine

## 2016-06-05 DIAGNOSIS — G4733 Obstructive sleep apnea (adult) (pediatric): Secondary | ICD-10-CM | POA: Diagnosis present

## 2016-06-10 ENCOUNTER — Ambulatory Visit (INDEPENDENT_AMBULATORY_CARE_PROVIDER_SITE_OTHER): Payer: Medicaid Other | Admitting: Family Medicine

## 2016-06-10 ENCOUNTER — Encounter: Payer: Self-pay | Admitting: Family Medicine

## 2016-06-10 VITALS — BP 122/80 | HR 84 | Temp 97.8°F | Resp 16 | Ht 72.0 in | Wt 335.0 lb

## 2016-06-10 DIAGNOSIS — I69398 Other sequelae of cerebral infarction: Secondary | ICD-10-CM

## 2016-06-10 DIAGNOSIS — R42 Dizziness and giddiness: Secondary | ICD-10-CM | POA: Diagnosis not present

## 2016-06-10 DIAGNOSIS — E118 Type 2 diabetes mellitus with unspecified complications: Secondary | ICD-10-CM

## 2016-06-10 DIAGNOSIS — Z8673 Personal history of transient ischemic attack (TIA), and cerebral infarction without residual deficits: Secondary | ICD-10-CM

## 2016-06-10 MED ORDER — MECLIZINE HCL 25 MG PO TABS: 25.0000 mg | ORAL_TABLET | Freq: Three times a day (TID) | ORAL | 0 refills | Status: DC | PRN

## 2016-06-10 MED ORDER — GLIPIZIDE 10 MG PO TABS: 10.0000 mg | ORAL_TABLET | Freq: Every day | ORAL | 3 refills | Status: DC

## 2016-06-10 MED ORDER — ASPIRIN EC 81 MG PO TBEC: 81.0000 mg | DELAYED_RELEASE_TABLET | Freq: Every day | ORAL | Status: DC

## 2016-06-10 NOTE — Progress Notes (Signed)
Subjective:    Patient ID: Kent Stanley., male    DOB: 20-Jan-1971, 45 y.o.   MRN: [id number]  Kent Stanley. is a 45 y.o. male presenting on 06/10/2016 for Headache (had Hx of stroke and TIA want to get referral as per pt he gets dizzy alot with walking around )   HPI   Vertigo / Dizziness / Imbalance / S/p L Cerebellar CVA and remote R Cerebellar infarct 08/2015 - Reports recent gradual worsening intermittent episodes of dizziness with mixed dizziness vs vertigo with room spinning, worse with with provoked symptoms if standing up, turning head and walking. - Currently without headache or new symptoms. Also few weeks ago had severe headache, concern for TIA. Has had similar headaches all s/p his prior stroke, last episodes 3 weeks ago, and >1-2 months ago. Usually improved with rest and eyes closed. Previously tried Advil / Ibuprofen but then was advised to stop NSAIDs due to CKD. Tries Tylenol occasionally but needs >1000mg  per dose - Prior history had severe stroke L acute cerebellar and remote R cerebellar 08/2015 with hospitalization, had residual dizziness, headache, and gradually improved over few weeks then was mostly symptom free in several month interval - Taking Plavix 75mg  daily and ASA 81mg  daily - He was referred to [facility] in 08/2015 but never established, and has not followed up with Neurology since. He would like referral today - No prior history of migraine headaches - Admits mild nausea with dizziness, without vomiting - Denies headache, new numbness, tingling, weakness, chest pain, dyspnea, syncope, hearing loss, sinusitis, ear pain   Social History  Substance Use Topics  . Smoking status: Former Smoker    Years: 10.00    Types: Cigarettes    Quit date: 08/07/2015  . Smokeless tobacco: Never Used  . Alcohol use No     Comment: socially    Review of Systems Per HPI unless specifically indicated above     Objective:    BP 122/80    Pulse 84   Temp 97.8 F (36.6 C) (Oral)   Resp 16   Ht 6' (1.829 m)   Wt (!) 335 lb (152 kg)   BMI 45.43 kg/m   Wt Readings from Last 3 Encounters:  06/10/16 (!) 335 lb (152 kg)  05/27/16 (!) 332 lb (150.6 kg)  05/19/16 (!) 331 lb (150.1 kg)    Physical Exam  Constitutional: He is oriented to person, place, and time. He appears well-developed and well-nourished. No distress.  HENT:  Head: Normocephalic and atraumatic.  Frontal / maxillary sinuses non-tender. Nares patent without purulence or edema. Bilateral TMs clear without erythema, effusion or bulging. Oropharynx clear without erythema, exudates, edema or asymmetry.  Neck: Normal range of motion. Neck supple.  No carotid bruits  Cardiovascular: Normal rate, regular rhythm, normal heart sounds and intact distal pulses.   No murmur heard. Pulmonary/Chest: Effort normal.  Neurological: He is alert and oriented to person, place, and time. No cranial nerve deficit. Coordination normal.  Mild discomfort with some dizziness on neck extension  Did not proceed to attempt Dix-Hallpike maneuver today due to intolerance  Skin: Skin is warm and dry. He is not diaphoretic.  Nursing note and vitals reviewed.  Results for orders placed or performed in visit on 05/19/16  Basic Metabolic Panel (BMET)  Result Value Ref Range   Sodium 137 135 - 145 mmol/L   Potassium 3.9 3.5 - 5.1 mmol/L   Chloride 103 101 - 111 mmol/L  CO2 28 22 - 32 mmol/L   Glucose, Bld 150 (H) 65 - 99 mg/dL   BUN 14 6 - 20 mg/dL   Creatinine, Ser 1.611.44 (H) 0.61 - 1.24 mg/dL   Calcium 8.8 (L) 8.9 - 10.3 mg/dL   GFR calc non Af Amer 57 (L) >60 mL/min   GFR calc Af Amer >60 >60 mL/min   Anion gap 6 5 - 15      Assessment & Plan:   Problem List Items Addressed This Visit    Vertigo due to previous cerebellar infarction - Primary    Concern with bilateral vertigo symptoms suspected secondary to old prior L cerebellar CVA 08/2015 with remote R cerebellar infarct,  unsure if any recent cerebral insult. Possible underlying BPPV as well with provoking symptoms with change head position. Not amenable to  [facility] - No other significant change neurological findings or focal deficits  Plan: 1. [facility] to re-establish for follow-up prior 08/2015 CVA, with concern now for possible recurrent TIA or new cerebrovascular insult since last stroke, concern for evaluating imaging, remains on DAPT, may need vestibular rehab or other neurorehab 2. Trial Meclizine 25mg  TID PRN dizziness 3. Handout given with Epley maneuver TID for 1-2 weeks until resolved 4. Strict return criteria, reviewed when to go to hospital if concern for TIA/CVA      Relevant Orders   Ambulatory referral to Neurology   Diabetes mellitus type 2, controlled, with complications (HCC)    Refilled Glipizide 10mg  daily today, sent to pharmacy No other changes, did not discuss diabetes management today Follow-up as planned 2 months, 08/15/16 for A1c      Relevant Medications   aspirin EC 81 MG tablet   glipiZIDE (GLUCOTROL) 10 MG tablet   meclizine (ANTIVERT) 25 MG tablet   Cerebrovascular accident, old    Chronic problem s/p L cerebellar infarct and remote L cerebellar infarct 08/2015, had resolved symptoms for months, now gradual worsening again. See A&P under vertigo, referral back to Neurology - Continue DAPT      Relevant Orders   Ambulatory referral to Neurology      Meds ordered this encounter  Medications  . aspirin EC 81 MG tablet    Sig: Take 1 tablet (81 mg total) by mouth daily.  [name]. glipiZIDE (GLUCOTROL) 10 MG tablet    Sig: Take 1 tablet (10 mg total) by mouth daily before breakfast.    Dispense:  90 tablet    Refill:  3  . meclizine (ANTIVERT) 25 MG tablet    Sig: Take 1 tablet (25 mg total) by mouth 3 (three) times daily as needed for dizziness or nausea.    Dispense:  30 tablet    Refill:  0      Follow up plan: Return  in about 2 months (around 08/15/2016) for diabetes.  Saralyn PilarAlexander Galadriel Shroff, DO [facility] 06/10/2016, 2:41 PM

## 2016-06-10 NOTE — Assessment & Plan Note (Signed)
Concern with bilateral vertigo symptoms suspected secondary to old prior L cerebellar CVA 08/2015 with remote R cerebellar infarct, unsure if any recent cerebral insult. Possible underlying BPPV as well with provoking symptoms with change head position. Not amenable to [facility] hallpike maneuver today - No other significant change neurological findings or focal deficits  Plan: 1. Urgent referral to [facility] to re-establish for follow-up prior 08/2015 CVA, with concern now for possible recurrent TIA or new cerebrovascular insult since last stroke, concern for evaluating imaging, remains on DAPT, may need vestibular rehab or other neurorehab 2. Trial Meclizine 25mg  TID PRN dizziness 3. Handout given with Epley maneuver TID for 1-2 weeks until resolved 4. Strict return criteria, reviewed when to go to hospital if concern for TIA/CVA

## 2016-06-10 NOTE — Assessment & Plan Note (Addendum)
Chronic problem s/p L cerebellar infarct and remote L cerebellar infarct 08/2015, had resolved symptoms for months, now gradual worsening again. See A&P under vertigo, referral back to Neurology - Continue DAPT

## 2016-06-10 NOTE — Patient Instructions (Signed)
Thank you for coming in to clinic today.  1. You have symptoms of Vertigo, I am concerned that this may be related to your old stroke, since it affected your Cerebellum which is the balance center of brain.  Possibly this is due to a more benign inner ear fluid imbalance.  Referral to neurology Dr Theora Master [facility] - Neurology Dept [address] [place], [place] [address] Phone: [phone]  - To treat this, try the Epley Manuever (see diagrams/instructions below) at home up to 3 times a day for 1-2 weeks or until symptoms resolve - You may take Meclizine as needed up to 3 times a day for dizziness, this will not cure symptoms but may help. Caution may make you drowsy.  If you develop significant worsening episode with vertigo that does not improve and you get severe headache, loss of vision, arm or leg weakness, slurred speech, or other concerning symptoms please seek immediate medical attention at Emergency Department.  Please schedule a follow-up appointment with Dr Althea Charon within 4 weeks if Vertigo not improving, and will consider Referral to Vestibular Rehab  See the next page for images describing the Epley Manuever.     ----------------------------------------------------------------------------------------------------------------------       Please schedule a follow-up appointment with Dr. Althea Charon in 2 months (after 08/15/16) for Diabetes as scheduled  If you have any other questions or concerns, please feel free to call the clinic or send a message through MyChart. You may also schedule an earlier appointment if necessary.  Saralyn Pilar, DO [facility], [place]

## 2016-06-10 NOTE — Assessment & Plan Note (Signed)
Refilled Glipizide 10mg  daily today, sent to pharmacy No other changes, did not discuss diabetes management today Follow-up as planned 2 months, 08/15/16 for A1c

## 2016-06-17 DIAGNOSIS — G4733 Obstructive sleep apnea (adult) (pediatric): Secondary | ICD-10-CM | POA: Diagnosis not present

## 2016-06-18 ENCOUNTER — Ambulatory Visit: Payer: Medicaid Other | Admitting: Family

## 2016-06-20 ENCOUNTER — Telehealth: Payer: Self-pay

## 2016-06-20 DIAGNOSIS — G4733 Obstructive sleep apnea (adult) (pediatric): Secondary | ICD-10-CM

## 2016-06-20 NOTE — Telephone Encounter (Signed)
-----   Message from Shane Crutch, MD sent at 06/17/2016 11:52 AM EST ----- Regarding: Bipap titrations study Patient completed titration study, Recommendation is for  Bipap of 22/18 using large FP Simplus FF mask.

## 2016-06-20 NOTE — Telephone Encounter (Signed)
Pt aware of results and voiced understanding. Pt scheduled for f/u with DR on 09/02/16. Nothing further needed

## 2016-07-03 ENCOUNTER — Ambulatory Visit: Payer: Medicaid Other | Admitting: Family

## 2016-07-16 ENCOUNTER — Telehealth: Payer: Self-pay | Admitting: Internal Medicine

## 2016-07-16 NOTE — Telephone Encounter (Signed)
Pt calling asking for a phone call or something  He states he did a sleep study about a month ago and he was told his results but nothing else to help with his sleep apnea  Would like to know something for he is having issues again Please call

## 2016-07-18 ENCOUNTER — Ambulatory Visit: Payer: Medicaid Other | Admitting: Family

## 2016-07-18 ENCOUNTER — Telehealth: Payer: Self-pay | Admitting: Family

## 2016-07-18 NOTE — Telephone Encounter (Signed)
Patient did not show for his [facility] appointment on 07/18/16. Will attempt to reschedule.

## 2016-07-28 ENCOUNTER — Telehealth: Payer: Self-pay | Admitting: Internal Medicine

## 2016-07-28 NOTE — Telephone Encounter (Signed)
Will you follow up on this to see if [facility] has contacted pt. Thanks.

## 2016-07-28 NOTE — Telephone Encounter (Signed)
Patient is confused about getting his sleep machine and orders that need to be sent. Please call patient.

## 2016-07-29 NOTE — Telephone Encounter (Signed)
ATC patient this morning at 9:37 am, no answer and went to fax mode.  Well attempt to contact patient later on this morning. Kent Stanley

## 2016-07-29 NOTE — Telephone Encounter (Signed)
Pt returned call and I advised patient that [facility] was needing form to be completed by physician for his insurance. I also advised him that [facility] should contact him today about setting an appointment up for him to come in to pick up his machine. Advised patient that if he hasn't received a call from [facility] by 4:00 pm today to return my call to [phone]. Pt voiced understanding and thanked me for my effort.  Rhonda J Cobb

## 2016-07-29 NOTE — Telephone Encounter (Signed)
Spoke with Liborio Nixon at [facility] and she stated that [facility] is waiting on the Prior Approval CMN/PA form to be completed and faxed back.   Dr. Ardyth Man completed this form yesterday and I faxed form this morning to [facility].  Per [facility], advise the patient that [facility] will contact patient today to schedule CPAP set up. Rhonda J Cobb

## 2016-07-29 NOTE — Telephone Encounter (Signed)
Pt returned call at 4:04 pm and stated that he has not heard anything from [facility] about scheduling an appointment for CPAP Set Up.  Called [facility] and received a recording. LMOAM for [facility] to return my call. Called patient back and advised pt that I would check back with [facility] in the am. Ollen Gross

## 2016-07-30 ENCOUNTER — Encounter: Payer: Self-pay | Admitting: *Deleted

## 2016-07-30 ENCOUNTER — Ambulatory Visit (INDEPENDENT_AMBULATORY_CARE_PROVIDER_SITE_OTHER): Payer: Medicaid Other | Admitting: Family Medicine

## 2016-07-30 ENCOUNTER — Encounter: Payer: Self-pay | Admitting: Family Medicine

## 2016-07-30 VITALS — BP 125/107 | HR 89 | Temp 97.7°F | Resp 16 | Ht 72.0 in | Wt 326.0 lb

## 2016-07-30 DIAGNOSIS — N5201 Erectile dysfunction due to arterial insufficiency: Secondary | ICD-10-CM | POA: Diagnosis not present

## 2016-07-30 MED ORDER — SILDENAFIL CITRATE 20 MG PO TABS: ORAL_TABLET | ORAL | 2 refills | Status: DC

## 2016-07-30 NOTE — Patient Instructions (Signed)
Thank you for coming in to clinic today.  1. Printed rx Sildenafil generic 20mg  tabs - Start with 1 tab about 30 min prior, if successful then stay with just 1 tablet - If not effective, increase to 2 tablets per dose, try this for at least 2 encounters before increasing to 3 tablets - Do not take with Nitro - As discussed if you start getting chest pain, stop med, seek immediate medical attention - Remember only one dose per 24 hours  Please schedule a follow-up appointment with Dr. Althea Charon in within 4 weeks as needed for ED  If you have any other questions or concerns, please feel free to call the clinic or send a message through MyChart. You may also schedule an earlier appointment if necessary.  Saralyn Pilar, DO [facility], [place]

## 2016-07-30 NOTE — Assessment & Plan Note (Signed)
Consistent with likely multifactorial ED secondary to vascular/neurogenic etiology in setting of significant DM, prior CVA, HLD, HTN, obesity. - Previously on Viagra 25-50mg  PRN per Cardiology - Mostly chest pain free now on Entresto, not using NTG >6 months  Plan: 1. Trial on generic Sildenafil 20mg  tabs, take 1-5 tabs about 30 min prior to sexual activity, refills provided, printed rx to take to [facility] for discount rx. Counseling on cautious starting with lowest dose at first 1 tab, if tolerates well without chest pain / dyspnea, can slowly increase if needed to 2 tabs, each subsequent dose try for more than 1 encounter prior to increasing to higher dose, absolute max is 5 tabs. Reviewed risks on this medication given his chronic medical conditions, and reinforced absolutely contraindicated with NTG. If concerning symptoms after trial on Sildenafil then needs to stop and seek immediate medical attention 2. Follow-up as needed

## 2016-07-30 NOTE — Telephone Encounter (Signed)
[facility] returned my call and stated that they have submitted the form that was faxed to pt's insurance to get approval. Once approval is obtained, pt will be contacted to set up CPAP. ATC patient and no answer, unable to leave message. Will attempt to contact patient this afternoon to advise of above. Rhonda J Cobb

## 2016-07-30 NOTE — Progress Notes (Signed)
Subjective:    Patient ID: Kent Geralds., male    DOB: 01/20/1971, 46 y.o.   MRN: [id number]  Kent Stanley. is a 46 y.o. male presenting on 07/30/2016 for Erectile Dysfunction   HPI   Erectile Dysfunction: - Reports prior history of ED had been on Viagra 25mg  PRN >1 year ago prior to significant worsening decline in medical health, and he has had persistent problems with ED >1 year now, at that time he took 2 of the 25mg  tabs for dose 50mg  and did well with this. Following his illness over past 1 year he has not been sexually active and has not been worried about this. He reports now feeling somewhat better he is interested in resuming the medication for ED, and has a new girlfriend as well and would like additional help with erections. He has tolerated sexual intercourse currently without chest pain or dyspnea or other symptoms, however now describes persistent difficulty getting a "full erection", he still has spontaneous erections including overnight / waking up. - He is aware of contraindication with viagra type meds and nitroglycerin tabs, and reports that he has only used 6 NTG in past >6 months, still has nearly full bottle, he has had dramatic improvement in resolved chest pain after starting Entresto and has been chest pain free by report. - Admits to good sexual desire - Requesting generic Sildenafil, plans to go to [facility] - Denies new numbness, tingling, weakness, chest pain, dyspnea, syncope, hearing loss, sinusitis, ear pain  Additional Social History - reports he has been approved for disability.   Social History  Substance Use Topics  . Smoking status: Former Smoker    Years: 10.00    Types: Cigarettes    Quit date: 08/07/2015  . Smokeless tobacco: Never Used  . Alcohol use No     Comment: socially    Review of Systems Per HPI unless specifically indicated above     Objective:    BP (!) 125/107 (BP Location: Right Arm, Patient  Position: Sitting, Cuff Size: Large)   Pulse 89   Temp 97.7 F (36.5 C) (Oral)   Resp 16   Ht 6' (1.829 m)   Wt (!) 326 lb (147.9 kg)   BMI 44.21 kg/m   Wt Readings from Last 3 Encounters:  07/30/16 (!) 326 lb (147.9 kg)  06/10/16 (!) 335 lb (152 kg)  05/27/16 (!) 332 lb (150.6 kg)    Physical Exam  Constitutional: He appears well-developed and well-nourished. No distress.  Chronically ill-appearing currently well, comfortable, cooperative, obese  HENT:  Head: Normocephalic and atraumatic.  Neck: Normal range of motion. Neck supple.  Cardiovascular: Normal rate, regular rhythm, normal heart sounds and intact distal pulses.   No murmur heard. Pulmonary/Chest: Effort normal and breath sounds normal. No respiratory distress. He has no wheezes. He has no rales.  Genitourinary:  Genitourinary Comments: Patient declined genital exam today.  Neurological: He is alert.  Skin: Skin is warm and dry. He is not diaphoretic.  Psychiatric: His behavior is normal.  Nursing note and vitals reviewed.  Results for orders placed or performed in visit on 05/19/16  Basic Metabolic Panel (BMET)  Result Value Ref Range   Sodium 137 135 - 145 mmol/L   Potassium 3.9 3.5 - 5.1 mmol/L   Chloride 103 101 - 111 mmol/L   CO2 28 22 - 32 mmol/L   Glucose, Bld 150 (H) 65 - 99 mg/dL   BUN 14 6 -  20 mg/dL   Creatinine, Ser 8.26 (H) 0.61 - 1.24 mg/dL   Calcium 8.8 (L) 8.9 - 10.3 mg/dL   GFR calc non Af Amer 57 (L) >60 mL/min   GFR calc Af Amer >60 >60 mL/min   Anion gap 6 5 - 15      Assessment & Plan:   Problem List Items Addressed This Visit    Erectile dysfunction - Primary    Consistent with likely multifactorial ED secondary to vascular/neurogenic etiology in setting of significant DM, prior CVA, HLD, HTN, obesity. - Previously on Viagra 25-50mg  PRN per Cardiology - Mostly chest pain free now on Entresto, not using NTG >6 months  Plan: 1. Trial on generic Sildenafil 20mg  tabs, take 1-5 tabs  about 30 min prior to sexual activity, refills provided, printed rx to take to [facility] for discount rx. Counseling on cautious starting with lowest dose at first 1 tab, if tolerates well without chest pain / dyspnea, can slowly increase if needed to 2 tabs, each subsequent dose try for more than 1 encounter prior to increasing to higher dose, absolute max is 5 tabs. Reviewed risks on this medication given his chronic medical conditions, and reinforced absolutely contraindicated with NTG. If concerning symptoms after trial on Sildenafil then needs to stop and seek immediate medical attention 2. Follow-up as needed      Relevant Medications   sildenafil (REVATIO) 20 MG tablet      Meds ordered this encounter  Medications  . sildenafil (REVATIO) 20 MG tablet    Sig: Take 1-5 pills about 30 min prior to sex. Start with 1 and increase as needed. Caution stop taking if chest pain. Do not take with Nitro.    Dispense:  30 tablet    Refill:  2      Follow up plan: Return in about 4 weeks (around 08/27/2016), or if symptoms worsen or fail to improve, for ED.  Saralyn Pilar, DO [facility] 07/30/2016, 6:57 PM

## 2016-07-31 NOTE — Telephone Encounter (Signed)
Pt returned call and I advised that [facility] stated that they have faxed the completed form by Dr. Ardyth Man to pt's insurance. Explained to patient that his insurance may respond back to [facility] this week or may be next week. That I wanted to stay on top of this issue, so I have asked patient to contact me on Monday 08/04/16 around 3:00 pm if he hasn't heard back from [facility]. Pt voiced understanding and assured me that he would call me back on Monday. Nothing else needed at this time. Rhonda J Cobb

## 2016-07-31 NOTE — Telephone Encounter (Signed)
ATC patient this morning and fax machine tone came on. Will attempt to contact patient again later in the am. Ollen Gross

## 2016-08-20 ENCOUNTER — Ambulatory Visit: Payer: Medicaid Other | Attending: Family | Admitting: Family

## 2016-08-20 ENCOUNTER — Encounter: Payer: Self-pay | Admitting: Family

## 2016-08-20 VITALS — BP 109/85 | HR 46 | Resp 18 | Ht 72.0 in | Wt 321.5 lb

## 2016-08-20 DIAGNOSIS — Z7982 Long term (current) use of aspirin: Secondary | ICD-10-CM | POA: Insufficient documentation

## 2016-08-20 DIAGNOSIS — I63542 Cerebral infarction due to unspecified occlusion or stenosis of left cerebellar artery: Secondary | ICD-10-CM

## 2016-08-20 DIAGNOSIS — I130 Hypertensive heart and chronic kidney disease with heart failure and stage 1 through stage 4 chronic kidney disease, or unspecified chronic kidney disease: Secondary | ICD-10-CM | POA: Insufficient documentation

## 2016-08-20 DIAGNOSIS — G4733 Obstructive sleep apnea (adult) (pediatric): Secondary | ICD-10-CM | POA: Diagnosis not present

## 2016-08-20 DIAGNOSIS — E118 Type 2 diabetes mellitus with unspecified complications: Secondary | ICD-10-CM

## 2016-08-20 DIAGNOSIS — Z79899 Other long term (current) drug therapy: Secondary | ICD-10-CM | POA: Diagnosis not present

## 2016-08-20 DIAGNOSIS — Z7984 Long term (current) use of oral hypoglycemic drugs: Secondary | ICD-10-CM | POA: Diagnosis not present

## 2016-08-20 DIAGNOSIS — N182 Chronic kidney disease, stage 2 (mild): Secondary | ICD-10-CM | POA: Insufficient documentation

## 2016-08-20 DIAGNOSIS — E1122 Type 2 diabetes mellitus with diabetic chronic kidney disease: Secondary | ICD-10-CM | POA: Diagnosis not present

## 2016-08-20 DIAGNOSIS — I5022 Chronic systolic (congestive) heart failure: Secondary | ICD-10-CM | POA: Insufficient documentation

## 2016-08-20 DIAGNOSIS — Z5189 Encounter for other specified aftercare: Secondary | ICD-10-CM | POA: Diagnosis not present

## 2016-08-20 DIAGNOSIS — I10 Essential (primary) hypertension: Secondary | ICD-10-CM

## 2016-08-20 MED ORDER — FUROSEMIDE 40 MG PO TABS: 40.0000 mg | ORAL_TABLET | Freq: Two times a day (BID) | ORAL | 5 refills | Status: DC

## 2016-08-20 MED ORDER — MECLIZINE HCL 25 MG PO TABS: 25.0000 mg | ORAL_TABLET | Freq: Three times a day (TID) | ORAL | 1 refills | Status: DC | PRN

## 2016-08-20 NOTE — Patient Instructions (Addendum)
Continue weighing daily and call for an overnight weight gain of > 2 pounds or a weekly weight gain of >5 pounds.  Decrease fluid pill to 40mg  in the morning and 20mg  in the afternoon. Can take an additional 20mg  if needed for weight gain, swelling or shortness of breath.

## 2016-08-20 NOTE — Progress Notes (Signed)
Patient ID: Kent Kindred., male    DOB: [birth year], 46 y.o.   MRN: [id number]  HPI  Kent Stanley is a 46 y/o male with a history of recent stroke (Feb 2017), CKD stage 2, CAD, hyperlipidemia, HTN, morbid obesity, polysubstance abuse in the past, prior tobacco use, seizures, DM and chronic heart failure.  Last echo was done 03/28/16 which showed an EF of 20-25% without regurgitation. Last catheterization was done March 2016.  Admitted on 03/27/16 with heart failure exacerbation. Was treated with IV diuretics and spironolactone was added. Discussion about possible ICD in the future if the EF remains low. Discharged the next day.  He presents today for his follow-up visit with fatigue with moderate exertion. Denies any shortness of breath or swelling in his legs/abdomen. Is increasing his activity level as he feels so much better. Is now drinking more water and less juice/tea. Has been wearing his CPAP for about the last week and no longer wakes up with a headache.   Past Medical History:  Diagnosis Date  . Cerebellar Stroke    a. 08/2015 MRI: Acute/subacute L superior cerebellar infarct. Remote R cerebellar infarct;  b. 08/2015 Carotid U/S: normal carotids.  . Chronic systolic heart failure ([place])    a. 10/2012 Echo: EF <20%, mod to sev dil LV, diast dysfxn, mildly dil LA/RA, mod to severe Kent/TR.  . CKD (chronic kidney disease), stage II   . Coronary artery disease, non-occlusive    a. 09/2014 Cath: minor luminal irregularities with severely dilated left ventricle. LVEDP 30.  [name] H/O noncompliance with medical treatment, presenting hazards to health   . History of traumatic injury of head   . Hyperlipidemia   . Hypertension   . Morbid obesity ([place])   . NICM (nonischemic cardiomyopathy) ([place])    a. 10/2012 Echo: EF <20%.  . Polysubstance abuse    a. cocaine, tobacco, THC, ETOH  . Seizure disorder ([place])   . Stroke ([place])   . Type II diabetes mellitus ([place])    Past Surgical History:   Procedure Laterality Date  . CARDIAC CATHETERIZATION     [facility]   Family History  Problem Relation Age of Onset  . Diabetes Mother   . Brain cancer Mother   . Heart attack Mother   . Sleep apnea Mother   . Heart failure Mother   . Hypertension Father   . Diabetes Father   . Heart disease Father   . Hyperlipidemia Father   . Heart attack Father   . Heart failure Father   . Sleep apnea Father   . Sleep apnea Sister   . Sleep apnea Brother   . Heart failure Maternal Aunt    Social History  Substance Use Topics  . Smoking status: Former Smoker    Years: 10.00    Types: Cigarettes    Quit date: 08/07/2015  . Smokeless tobacco: Never Used  . Alcohol use No     Comment: socially   Allergies  Allergen Reactions  . Tomato Rash   Prior to Admission medications   Medication Sig Start Date End Date Taking? Authorizing Provider  albuterol (PROVENTIL HFA;VENTOLIN HFA) 108 (90 Base) MCG/ACT inhaler Inhale 2 puffs into the lungs every 6 (six) hours as needed for wheezing or shortness of breath. 03/28/16  Yes Amy Overton Mam, NP  aspirin EC 81 MG tablet Take 1 tablet (81 mg total) by mouth daily. 06/10/16  Yes Olin Hauser, DO  blood glucose meter kit and supplies  KIT Dispense based on patient and insurance preference. Check blood glucose once daily. 08/21/15  Yes Amy Overton Mam, NP  clopidogrel (PLAVIX) 75 MG tablet Take 1 tablet (75 mg total) by mouth daily. Patient taking differently: Take 75 mg by mouth every morning.  02/12/16  Yes Amy Overton Mam, NP  furosemide (LASIX) 40 MG tablet Take 1 tablet (40 mg total) by mouth 2 (two) times daily. 08/20/16  Yes Alisa Graff, FNP  glipiZIDE (GLUCOTROL) 10 MG tablet Take 1 tablet (10 mg total) by mouth daily before breakfast. 06/10/16  Yes Olin Hauser, DO  meclizine (ANTIVERT) 25 MG tablet Take 1 tablet (25 mg total) by mouth 3 (three) times daily as needed for dizziness or nausea. 08/20/16  Yes Alisa Graff, FNP   metoprolol succinate (TOPROL-XL) 25 MG 24 hr tablet Take 25 mg by mouth every morning.    Yes Historical Provider, MD  nitroGLYCERIN (NITROSTAT) 0.4 MG SL tablet Place 1 tablet (0.4 mg total) under the tongue every 5 (five) minutes as needed for chest pain. 03/28/16  Yes Loletha Grayer, MD  pravastatin (PRAVACHOL) 80 MG tablet Take 1 tablet (80 mg total) by mouth daily at 6 PM. 09/12/15  Yes Amy Overton Mam, NP  sacubitril-valsartan (ENTRESTO) 49-51 MG Take 1 tablet by mouth 2 (two) times daily. 05/19/16  Yes Alisa Graff, FNP  sildenafil (REVATIO) 20 MG tablet Take 1-5 pills about 30 min prior to sex. Start with 1 and increase as needed. Caution stop taking if chest pain. Do not take with Nitro. 07/30/16  Yes Alexander Devin Going, DO  spironolactone (ALDACTONE) 25 MG tablet Take 0.5 tablets (12.5 mg total) by mouth daily. 05/27/16  Yes Wellington Hampshire, MD   Review of Systems  Constitutional: Positive for fatigue. Negative for appetite change.  HENT: Negative for congestion, postnasal drip and sore throat.   Eyes: Negative.   Respiratory: Negative for chest tightness, shortness of breath and wheezing.   Cardiovascular: Negative for chest pain, palpitations and leg swelling.  Gastrointestinal: Negative for abdominal distention and abdominal pain.  Endocrine: Negative.   Genitourinary: Negative.   Musculoskeletal: Negative for back pain and neck pain.  Skin: Negative.   Allergic/Immunologic: Negative.   Neurological: Positive for light-headedness. Negative for dizziness.  Hematological: Negative for adenopathy. Does not bruise/bleed easily.  Psychiatric/Behavioral: Negative for dysphoric mood and sleep disturbance (sleeping with CPAP about 5-6 hours a night). The patient is not nervous/anxious.    Vitals:   08/20/16 1113  BP: 109/85  Pulse: (!) 46  Resp: 18  SpO2: 100%  Weight: (!) 321 lb 8 oz (145.8 kg)  Height: 6' (1.829 m)   Wt Readings from Last 3 Encounters:  08/20/16 (!)  321 lb 8 oz (145.8 kg)  07/30/16 (!) 326 lb (147.9 kg)  06/10/16 (!) 335 lb (152 kg)   Lab Results  Component Value Date   CREATININE 1.44 (H) 05/19/2016   CREATININE 1.62 (H) 04/08/2016   CREATININE 1.55 (H) 03/28/2016    Physical Exam  Constitutional: He is oriented to person, place, and time. He appears well-developed and well-nourished.  HENT:  Head: Normocephalic and atraumatic.  Eyes: Conjunctivae are normal. Pupils are equal, round, and reactive to light.  Neck: Normal range of motion. Neck supple. No JVD present.  Cardiovascular: An irregular rhythm present. Bradycardia present.   Pulmonary/Chest: Effort normal. He has no wheezes. He has no rales.  Abdominal: Soft. He exhibits no distension. There is no tenderness.  Musculoskeletal: He exhibits  no edema or tenderness.  Neurological: He is alert and oriented to person, place, and time.  Skin: Skin is warm and dry.  Psychiatric: He has a normal mood and affect. His behavior is normal. Thought content normal.  Nursing note and vitals reviewed.   Assessment & Plan:  1: Chronic heart failure with reduced ejection fraction- - NYHA Class II - euvolemic today - weight down 9.2 pounds since last here on 05/19/16. To call for an overnight weight gain of >2 pounds or a weekly weight gain of >5 pounds - Using Mrs. Dash, does use regular salt on popcorn (infrequent).  - now riding stationary bike for 30 minutes every other day - no signs of fluid overload so will decrease furosemide even more to '40mg'$  AM & '20mg'$  PM. Can take an additional '20mg'$  if needed for above weight parameters, edema or shortness of breath - Saw cardiologist Fletcher [name]) 05/27/16 - may need to decrease toprol if HR remains low - may titrate up entresto if BP allows  2: HTN- - BP looks good today  3: Obstructive sleep apnea- - wearing CPAP nightly for the last week - reports sleeping better and not waking up with a headache anymore  4: Diabetes-  - last A1c 8.8%  on 05/15/16 - PCP to make nephrology referral for patient  5: CVA - Walks with cane due to continued balance issues  Medication bottles were reviewed.  Return in 3 months or sooner for any questions/problems before then.

## 2016-08-27 ENCOUNTER — Other Ambulatory Visit: Payer: Self-pay

## 2016-08-27 MED ORDER — SACUBITRIL-VALSARTAN 49-51 MG PO TABS: 1.0000 | ORAL_TABLET | Freq: Two times a day (BID) | ORAL | 0 refills | Status: DC

## 2016-08-27 MED ORDER — SACUBITRIL-VALSARTAN 49-51 MG PO TABS: 1.0000 | ORAL_TABLET | Freq: Two times a day (BID) | ORAL | 0 refills | Status: AC

## 2016-08-27 NOTE — Telephone Encounter (Signed)
Medicaid is requiring a PA for his Entresto. I am going to contact patients pharmacy as we have not received the documentation and patient is going to come pick up samples so he does not run out.

## 2016-09-02 ENCOUNTER — Ambulatory Visit (INDEPENDENT_AMBULATORY_CARE_PROVIDER_SITE_OTHER): Payer: Medicaid Other | Admitting: Pulmonary Disease

## 2016-09-08 ENCOUNTER — Emergency Department
Admission: EM | Admit: 2016-09-08 | Discharge: 2016-09-08 | Disposition: A | Discharge status: Home / Self Care | Payer: Medicaid Other | Attending: Emergency Medicine | Admitting: Emergency Medicine

## 2016-09-08 ENCOUNTER — Emergency Department: Payer: Medicaid Other

## 2016-09-08 ENCOUNTER — Encounter: Payer: Self-pay | Admitting: Emergency Medicine

## 2016-09-08 DIAGNOSIS — Z79899 Other long term (current) drug therapy: Secondary | ICD-10-CM | POA: Diagnosis not present

## 2016-09-08 DIAGNOSIS — Z7984 Long term (current) use of oral hypoglycemic drugs: Secondary | ICD-10-CM | POA: Diagnosis not present

## 2016-09-08 DIAGNOSIS — I130 Hypertensive heart and chronic kidney disease with heart failure and stage 1 through stage 4 chronic kidney disease, or unspecified chronic kidney disease: Secondary | ICD-10-CM | POA: Insufficient documentation

## 2016-09-08 DIAGNOSIS — I5022 Chronic systolic (congestive) heart failure: Secondary | ICD-10-CM | POA: Insufficient documentation

## 2016-09-08 DIAGNOSIS — E1122 Type 2 diabetes mellitus with diabetic chronic kidney disease: Secondary | ICD-10-CM | POA: Insufficient documentation

## 2016-09-08 DIAGNOSIS — N183 Chronic kidney disease, stage 3 (moderate): Secondary | ICD-10-CM | POA: Diagnosis not present

## 2016-09-08 DIAGNOSIS — Z7982 Long term (current) use of aspirin: Secondary | ICD-10-CM | POA: Diagnosis not present

## 2016-09-08 DIAGNOSIS — I2510 Atherosclerotic heart disease of native coronary artery without angina pectoris: Secondary | ICD-10-CM | POA: Insufficient documentation

## 2016-09-08 DIAGNOSIS — M25512 Pain in left shoulder: Secondary | ICD-10-CM | POA: Insufficient documentation

## 2016-09-08 DIAGNOSIS — Z87891 Personal history of nicotine dependence: Secondary | ICD-10-CM | POA: Diagnosis not present

## 2016-09-08 DIAGNOSIS — M79602 Pain in left arm: Secondary | ICD-10-CM | POA: Diagnosis present

## 2016-09-08 LAB — BASIC METABOLIC PANEL WITH GFR
Anion gap: 6 (ref 5–15)
BUN: 16 mg/dL (ref 6–20)
CO2: 28 mmol/L (ref 22–32)
Calcium: 8.6 mg/dL — ABNORMAL LOW (ref 8.9–10.3)
Chloride: 103 mmol/L (ref 101–111)
Creatinine, Ser: 1.88 mg/dL — ABNORMAL HIGH (ref 0.61–1.24)
GFR calc Af Amer: 47 mL/min — ABNORMAL LOW
GFR calc non Af Amer: 41 mL/min — ABNORMAL LOW
Glucose, Bld: 259 mg/dL — ABNORMAL HIGH (ref 65–99)
Potassium: 4 mmol/L (ref 3.5–5.1)
Sodium: 137 mmol/L (ref 135–145)

## 2016-09-08 LAB — CBC
HCT: 42.8 % (ref 40.0–52.0)
Hemoglobin: 14.3 g/dL (ref 13.0–18.0)
MCH: 32.9 pg (ref 26.0–34.0)
MCHC: 33.4 g/dL (ref 32.0–36.0)
MCV: 98.5 fL (ref 80.0–100.0)
PLATELETS: 243 10*3/uL (ref 150–440)
RBC: 4.34 MIL/uL — ABNORMAL LOW (ref 4.40–5.90)
RDW: 14.4 % (ref 11.5–14.5)
WBC: 6.4 10*3/uL (ref 3.8–10.6)

## 2016-09-08 LAB — TROPONIN I
Troponin I: 0.03 ng/mL
Troponin I: 0.03 ng/mL (ref <0.03)

## 2016-09-08 MED ORDER — OXYCODONE-ACETAMINOPHEN 5-325 MG PO TABS: 2.0000 | ORAL_TABLET | Freq: Four times a day (QID) | ORAL | 0 refills | Status: DC | PRN

## 2016-09-08 MED ORDER — MECLIZINE HCL 25 MG PO TABS
25.0000 mg | ORAL_TABLET | Freq: Once | ORAL | Status: AC
  Administered 2016-09-08: 25 mg via ORAL

## 2016-09-08 MED ORDER — MECLIZINE HCL 25 MG PO TABS
ORAL_TABLET | ORAL | Status: AC
  Administered 2016-09-08: 25 mg via ORAL
  Filled 2016-09-08: qty 1

## 2016-09-08 NOTE — ED Notes (Signed)
Pt to Xray.

## 2016-09-08 NOTE — ED Notes (Signed)
Pt returned from [place]. C/o consistent pain in left upper arm for 5 days, states the pain is going up in his left neck. Denies injury to area.

## 2016-09-08 NOTE — ED Provider Notes (Signed)
[facility] Emergency Department Provider Note        Time seen: ----------------------------------------- 10:07 AM on 09/08/2016 -----------------------------------------    I have reviewed the triage vital signs and the nursing notes.   HISTORY  Chief Complaint Arm Pain    HPI Kent Stanley. is a 46 y.o. male who presents to ER for left arm pain for last 4-5 days. Pain seems to be worse at night and worse when he is moving his left arm. Pain is currently 8 out of 10 in the left arm, lifting his left arm over his head makes it worse. Patient denies any recent heavy physical activity or exertion that would've caused this pain. He denies any chest pain or difficulty breathing.   Past Medical History:  Diagnosis Date  . Cerebellar Stroke    a. 08/2015 MRI: Acute/subacute L superior cerebellar infarct. Remote R cerebellar infarct;  b. 08/2015 Carotid U/S: normal carotids.  . Chronic systolic heart failure (HCC)    a. 10/2012 Echo: EF <20%, mod to sev dil LV, diast dysfxn, mildly dil LA/RA, mod to severe MR/TR.  . CKD (chronic kidney disease), stage II   . Coronary artery disease, non-occlusive    a. 09/2014 Cath: minor luminal irregularities with severely dilated left ventricle. LVEDP 30.  [name] H/O noncompliance with medical treatment, presenting hazards to health   . History of traumatic injury of head   . Hyperlipidemia   . Hypertension   . Morbid obesity (HCC)   . NICM (nonischemic cardiomyopathy) (HCC)    a. 10/2012 Echo: EF <20%.  . Polysubstance abuse    a. cocaine, tobacco, THC, ETOH  . Seizure disorder (HCC)   . Stroke (HCC)   . Type II diabetes mellitus [facility])     Patient Active Problem List   Diagnosis Date Noted  . Vertigo due to previous cerebellar infarction 06/10/2016  . CKD stage 3 secondary to diabetes (HCC) 05/15/2016  . Diabetes mellitus type 2, controlled, with complications (HCC) 04/18/2016  . Morbid obesity (HCC)   .  Polysubstance abuse   . Seizure disorder (HCC)   . NICM (nonischemic cardiomyopathy) (HCC)   . Stroke (cerebrum) (HCC) 08/12/2015  . Epididymitis 12/25/2014  . Musculoskeletal pain 10/31/2014  . Erectile dysfunction 10/31/2014  . Sleep apnea 11/05/2013  . Chronic systolic heart failure (HCC)   . Hyperlipidemia   . Hypertension   . HLD (hyperlipidemia) 01/27/2013  . Adiposity 01/27/2013  . Cerebral seizure 01/27/2013  . Cerebrovascular accident, old 01/27/2013    Past Surgical History:  Procedure Laterality Date  . CARDIAC CATHETERIZATION     [facility]    Allergies Tomato  Social History Social History  Substance Use Topics  . Smoking status: Former Smoker    Years: 10.00    Types: Cigarettes    Quit date: 08/07/2015  . Smokeless tobacco: Never Used  . Alcohol use No     Comment: socially    Review of Systems Constitutional: Negative for fever. Cardiovascular: Negative for chest pain. Respiratory: Negative for shortness of breath. Gastrointestinal: Negative for abdominal pain, vomiting and diarrhea. Genitourinary: Negative for dysuria. Musculoskeletal: Positive for arm pain Skin: Negative for rash. Neurological: Negative for headaches, focal weakness or numbness.  10-point ROS otherwise negative.  ____________________________________________   PHYSICAL EXAM:  VITAL SIGNS: ED Triage Vitals  Enc Vitals Group     BP 09/08/16 0922 (!) 110/56     Pulse Rate 09/08/16 0922 (!) 46     Resp 09/08/16 1610  16     Temp 09/08/16 0922 98.1 F (36.7 C)     Temp Source 09/08/16 0922 Oral     SpO2 09/08/16 0922 94 %     Weight 09/08/16 0920 (!) 320 lb (145.2 kg)     Height 09/08/16 0920 6' (1.829 m)     Head Circumference --      Peak Flow --      Pain Score 09/08/16 0920 8     Pain Loc --      Pain Edu? --      Excl. in GC? --     Constitutional: Alert and oriented. Well appearing and in no distress. Eyes: Conjunctivae are normal. PERRL. Normal extraocular  movements. ENT   Head: Normocephalic and atraumatic.   Nose: No congestion/rhinnorhea.   Mouth/Throat: Mucous membranes are moist.   Neck: No stridor. Cardiovascular: Normal rate, regular rhythm. No murmurs, rubs, or gallops. Respiratory: Normal respiratory effort without tachypnea nor retractions. Breath sounds are clear and equal bilaterally. No wheezes/rales/rhonchi. Gastrointestinal: Soft and nontender. Normal bowel sounds Musculoskeletal: Specific pain with range of motion of the left shoulder. Limited movement of the left upper extremity over his head.  Neurologic:  Normal speech and language. No gross focal neurologic deficits are appreciated.  Skin:  Skin is warm, dry and intact. No rash noted. Psychiatric: Mood and affect are normal. Speech and behavior are normal.  ____________________________________________  EKG: Interpreted by me. Sinus rhythm with a rate of 90 bpm, normal PR interval, normal QRS, normal QT, normal axis. T-wave inversions laterally that are chronic  ____________________________________________  ED COURSE:  Pertinent labs & imaging results that were available during my care of the patient were reviewed by me and considered in my medical decision making (see chart for details). She presents to ER for arm pain of uncertain etiology. We will assess with basic labs and reevaluate.   Procedures ____________________________________________   LABS (pertinent positives/negatives)  Labs Reviewed  BASIC METABOLIC PANEL - Abnormal; Notable for the following:       Result Value   Glucose, Bld 259 (*)    Creatinine, Ser 1.88 (*)    Calcium 8.6 (*)    GFR calc non Af Amer 41 (*)    GFR calc Af Amer 47 (*)    All other components within normal limits  CBC - Abnormal; Notable for the following:    RBC 4.34 (*)    All other components within normal limits  TROPONIN I - Abnormal; Notable for the following:    Troponin I 0.03 (*)    All other  components within normal limits  TROPONIN I - Abnormal; Notable for the following:    Troponin I 0.03 (*)    All other components within normal limits    RADIOLOGY Images were viewed by me  Chest x-ray IMPRESSION: No active cardiopulmonary disease. ____________________________________________  FINAL ASSESSMENT AND PLAN  Shoulder pain  Plan: Patient with labs and imaging as dictated above. Troponin is unchanged after the second draw. His symptoms are consistent with musculoskeletal pain and likely rotator cuff involvement of the left arm. He'll be given instructions for outpatient orthopedics follow-up.   Emily Filbert, MD   Note: This note was generated in part or whole with voice recognition software. Voice recognition is usually quite accurate but there are transcription errors that can and very often do occur. I apologize for any typographical errors that were not detected and corrected.     Emily Filbert, MD  09/08/16 1315  

## 2016-09-08 NOTE — ED Notes (Signed)
Upon standing to leave, patient became very dizzy and reports, "I didn't take my Antivert today." Patient requested to stay and MD made aware. Patient given dose of Antivert asked to wait 30 minutes before departure. Patient verbalized understanding.

## 2016-09-08 NOTE — ED Triage Notes (Signed)
Left arm pain x 4-5 days.  Pain worse at night.  Pain is worse when moving left arm.

## 2016-09-18 ENCOUNTER — Other Ambulatory Visit: Payer: Self-pay | Admitting: Family Medicine

## 2016-09-18 MED ORDER — PRAVASTATIN SODIUM 80 MG PO TABS: 80.0000 mg | ORAL_TABLET | Freq: Every day | ORAL | 2 refills | Status: DC

## 2016-09-23 ENCOUNTER — Inpatient Hospital Stay: Payer: Medicaid Other | Admitting: Family Medicine

## 2016-09-23 ENCOUNTER — Ambulatory Visit (INDEPENDENT_AMBULATORY_CARE_PROVIDER_SITE_OTHER): Payer: Medicaid Other | Admitting: Cardiovascular Disease

## 2016-09-23 ENCOUNTER — Encounter: Payer: Self-pay | Admitting: Cardiovascular Disease

## 2016-09-23 VITALS — BP 130/82 | HR 60 | Ht 72.0 in | Wt 323.5 lb

## 2016-09-23 DIAGNOSIS — G4733 Obstructive sleep apnea (adult) (pediatric): Secondary | ICD-10-CM | POA: Diagnosis not present

## 2016-09-23 DIAGNOSIS — I5022 Chronic systolic (congestive) heart failure: Secondary | ICD-10-CM

## 2016-09-23 MED ORDER — FUROSEMIDE 40 MG PO TABS: 40.0000 mg | ORAL_TABLET | Freq: Every day | ORAL | 3 refills | Status: DC

## 2016-09-23 NOTE — Progress Notes (Signed)
Cardiology Office Note   Date:  09/23/2016   ID:  Kent Stanley., DOB [birth year], MRN [id number]  PCP:  Nobie Putnam, DO  Cardiologist:   Kathlyn Sacramento, MD   Chief Complaint  Patient presents with  . other    4 month f/u c/o rotart cuff pain. Meds reviewed verbally with pt.      History of Present Illness: Kent Stanley. is a 46 y.o. male who presents for a follow-up visit regarding chronic systolic heart failure due to nonischemic cardiomyopathy.  He was diagnosed with systolic heart failure in 2014 with initial ejection fraction less than 20%.   He has a history of a seizure disorder since he was 42, hypertension, prior stroke in 2017 on Plavix, chronic kidney disease, morbid obesity and  severe sleep apnea. He quit smoking in 2014. He drinks red wine very rarely. He has used marijuana and cocaine in the past but none recently. Cardiac catheterization in March, 2016 showed minor luminal irregularities without evidence of obstructive coronary artery disease.Left ventricular end-diastolic pressure was 30 mmHg.  Most recent echo in September 2017 showed an ejection fraction of 20-25%.  He has been doingVery well with no chest pain and improve dyspnea. No significant leg edema. He reports significant improvement with stamina after he started using CPAP. He went to the emergency room recently with left arm and shoulder pain and was worried about cardiac condition. His labs were unremarkable. His creatinine did worsen to 1.88.   Past Medical History:  Diagnosis Date  . Cerebellar Stroke    a. 08/2015 MRI: Acute/subacute L superior cerebellar infarct. Remote R cerebellar infarct;  b. 08/2015 Carotid U/S: normal carotids.  . Chronic systolic heart failure ([place])    a. 10/2012 Echo: EF <20%, mod to sev dil LV, diast dysfxn, mildly dil LA/RA, mod to severe MR/TR.  . CKD (chronic kidney disease), stage II   . Coronary artery disease, non-occlusive    a. 09/2014 Cath:  minor luminal irregularities with severely dilated left ventricle. LVEDP 30.  [name] H/O noncompliance with medical treatment, presenting hazards to health   . History of traumatic injury of head   . Hyperlipidemia   . Hypertension   . Morbid obesity ([place])   . NICM (nonischemic cardiomyopathy) ([place])    a. 10/2012 Echo: EF <20%.  . Polysubstance abuse    a. cocaine, tobacco, THC, ETOH  . Seizure disorder ([place])   . Stroke ([place])   . Type II diabetes mellitus ([place])     Past Surgical History:  Procedure Laterality Date  . CARDIAC CATHETERIZATION     [facility]     Current Outpatient Prescriptions  Medication Sig Dispense Refill  . albuterol (PROVENTIL HFA;VENTOLIN HFA) 108 (90 Base) MCG/ACT inhaler Inhale 2 puffs into the lungs every 6 (six) hours as needed for wheezing or shortness of breath. 1 Inhaler 11  . aspirin EC 81 MG tablet Take 1 tablet (81 mg total) by mouth daily.    . blood glucose meter kit and supplies KIT Dispense based on patient and insurance preference. Check blood glucose once daily. 1 each 0  . clopidogrel (PLAVIX) 75 MG tablet Take 1 tablet (75 mg total) by mouth daily. (Patient taking differently: Take 75 mg by mouth every morning. ) 30 tablet 11  . furosemide (LASIX) 40 MG tablet Take 1 tablet (40 mg total) by mouth 2 (two) times daily. (Patient taking differently: Take by mouth 2 (two) times daily. Take 41m AM and  67m PM. Can take additional 281mtablet if needed) 60 tablet 5  . glipiZIDE (GLUCOTROL) 10 MG tablet Take 1 tablet (10 mg total) by mouth daily before breakfast. 90 tablet 3  . meclizine (ANTIVERT) 25 MG tablet Take 1 tablet (25 mg total) by mouth 3 (three) times daily as needed for dizziness or nausea. 30 tablet 1  . metoprolol succinate (TOPROL-XL) 25 MG 24 hr tablet Take 25 mg by mouth every morning.     . nitroGLYCERIN (NITROSTAT) 0.4 MG SL tablet Place 1 tablet (0.4 mg total) under the tongue every 5 (five) minutes as needed for chest pain. 30 tablet 0  .  oxyCODONE-acetaminophen (PERCOCET) 5-325 MG tablet Take 2 tablets by mouth every 6 (six) hours as needed for moderate pain or severe pain. 20 tablet 0  . pravastatin (PRAVACHOL) 80 MG tablet Take 1 tablet (80 mg total) by mouth daily at 6 PM. 30 tablet 2  . sacubitril-valsartan (ENTRESTO) 49-51 MG Take 1 tablet by mouth 2 (two) times daily. 60 tablet 5  . sildenafil (REVATIO) 20 MG tablet Take 1-5 pills about 30 min prior to sex. Start with 1 and increase as needed. Caution stop taking if chest pain. Do not take with Nitro. 30 tablet 2  . spironolactone (ALDACTONE) 25 MG tablet Take 0.5 tablets (12.5 mg total) by mouth daily. 30 tablet 6   No current facility-administered medications for this visit.     Allergies:   Tomato    Social History:  The patient  reports that he quit smoking about 13 months ago. His smoking use included Cigarettes. He quit after 10.00 years of use. He has never used smokeless tobacco. He reports that he does not drink alcohol or use drugs.   Family History:  The patient's family history includes Brain cancer in his mother; Diabetes in his father and mother; Heart attack in his father and mother; Heart disease in his father; Heart failure in his father, maternal aunt, and mother; Hyperlipidemia in his father; Hypertension in his father; Sleep apnea in his brother, father, mother, and sister.    ROS:  Please see the history of present illness.   Otherwise, review of systems are positive for none.   All other systems are reviewed and negative.    PHYSICAL EXAM: VS:  BP 130/82 (BP Location: Left Arm, Patient Position: Sitting, Cuff Size: Large)   Pulse 60   Ht 6' (1.829 m)   Wt (!) 323 lb 8 oz (146.7 kg)   BMI 43.87 kg/m  , BMI Body mass index is 43.87 kg/m. GEN: Well nourished, well developed, in no acute distress  HEENT: normal  Neck: no JVD, carotid bruits, or masses Cardiac: RRR; no murmurs, rubs, or gallops,no edema  Respiratory:  clear to auscultation  bilaterally, normal work of breathing GI: soft, nontender, nondistended, + BS MS: no deformity or atrophy  Skin: warm and dry, no rash Neuro:  Strength and sensation are intact Psych: euthymic mood, full affect   EKG:  EKG is not ordered today.   Recent Labs: 02/12/2016: Brain Natriuretic Peptide 403.8 03/27/2016: ALT 24 09/08/2016: BUN 16; Creatinine, Ser 1.88; Hemoglobin 14.3; Platelets 243; Potassium 4.0; Sodium 137    Lipid Panel    Component Value Date/Time   CHOL 127 02/12/2016 1404   CHOL 183 10/14/2012 0354   TRIG 108 02/12/2016 1404   TRIG 79 10/14/2012 0354   HDL 28 (L) 02/12/2016 1404   HDL 44 10/14/2012 0354   CHOLHDL 4.5 02/12/2016 1404  VLDL 22 02/12/2016 1404   VLDL 16 10/14/2012 0354   LDLCALC 77 02/12/2016 1404   LDLCALC 123 (H) 10/14/2012 0354      Wt Readings from Last 3 Encounters:  09/23/16 (!) 323 lb 8 oz (146.7 kg)  09/08/16 (!) 320 lb (145.2 kg)  08/20/16 (!) 321 lb 8 oz (145.8 kg)       PAD Screen 04/08/2016  Previous PAD dx? No  Previous surgical procedure? No  Pain with walking? Yes  Subsides with rest? Yes  Feet/toe relief with dangling? No  Painful, non-healing ulcers? No  Extremities discolored? No      ASSESSMENT AND PLAN:  1.  Chronic systolic heart failure: Due to nonischemic cardiomyopathy. Currently New York Heart Association class II. He appears to be euvolemic.   Given recent worsening of creatinine, I decreased the dose of furosemide to 40 mg once daily. I requested an echocardiogram to reevaluate his ejection fraction. If EF is less than 35%, I will refer him to Dr. [name] to consider an ICD placement. QRS has not been wide enough for CRT.  2. Severe sleep apnea: Significant improvement in symptoms with CPAP.   3. Previous stroke: Currently on Plavix. I discontinued aspirin.  4. Morbid obesity: The patient is interested in weight loss surgery. I'm going to reevaluate his ejection fraction to make sure he is going to be a  surgical candidate.     Disposition:   FU with me in 4 months  Signed,  Kathlyn Sacramento, MD  09/23/2016 1:50 PM    [facility]

## 2016-09-23 NOTE — Patient Instructions (Addendum)
Medication Instructions:  Your physician has recommended you make the following change in your medication:  STOP taking aspirin DECREASE lasix to 40mg  once daily   Labwork: none  Testing/Procedures: Your physician has requested that you have an echocardiogram. Echocardiography is a painless test that uses sound waves to create images of your heart. It provides your doctor with information about the size and shape of your heart and how well your heart's chambers and valves are working. This procedure takes approximately one hour. There are no restrictions for this procedure.    Follow-Up: Your physician recommends that you schedule a follow-up appointment in: 4 months with Dr. Kirke Corin.    Any Other Special Instructions Will Be Listed Below (If Applicable).     If you need a refill on your cardiac medications before your next appointment, please call your pharmacy.  Echocardiogram An echocardiogram, or echocardiography, uses sound waves (ultrasound) to produce an image of your heart. The echocardiogram is simple, painless, obtained within a short period of time, and offers valuable information to your health care provider. The images from an echocardiogram can provide information such as:  Evidence of coronary artery disease (CAD).  Heart size.  Heart muscle function.  Heart valve function.  Aneurysm detection.  Evidence of a past heart attack.  Fluid buildup around the heart.  Heart muscle thickening.  Assess heart valve function. Tell a health care provider about:  Any allergies you have.  All medicines you are taking, including vitamins, herbs, eye drops, creams, and over-the-counter medicines.  Any problems you or family members have had with anesthetic medicines.  Any blood disorders you have.  Any surgeries you have had.  Any medical conditions you have.  Whether you are pregnant or may be pregnant. What happens before the procedure? No special  preparation is needed. Eat and drink normally. What happens during the procedure?  In order to produce an image of your heart, gel will be applied to your chest and a wand-like tool (transducer) will be moved over your chest. The gel will help transmit the sound waves from the transducer. The sound waves will harmlessly bounce off your heart to allow the heart images to be captured in real-time motion. These images will then be recorded.  You may need an IV to receive a medicine that improves the quality of the pictures. What happens after the procedure? You may return to your normal schedule including diet, activities, and medicines, unless your health care provider tells you otherwise. This information is not intended to replace advice given to you by your health care provider. Make sure you discuss any questions you have with your health care provider. Document Released: 06/20/2000 Document Revised: 02/09/2016 Document Reviewed: 02/28/2013 Elsevier Interactive Patient Education  2017 [employer].

## 2016-10-06 ENCOUNTER — Other Ambulatory Visit: Payer: Self-pay | Admitting: Family

## 2016-10-06 DIAGNOSIS — E118 Type 2 diabetes mellitus with unspecified complications: Secondary | ICD-10-CM

## 2016-10-13 ENCOUNTER — Telehealth: Payer: Self-pay | Admitting: Family Medicine

## 2016-10-13 DIAGNOSIS — I5022 Chronic systolic (congestive) heart failure: Secondary | ICD-10-CM

## 2016-10-13 DIAGNOSIS — Z8673 Personal history of transient ischemic attack (TIA), and cerebral infarction without residual deficits: Secondary | ICD-10-CM

## 2016-10-13 DIAGNOSIS — I10 Essential (primary) hypertension: Secondary | ICD-10-CM

## 2016-10-13 MED ORDER — METOPROLOL SUCCINATE ER 25 MG PO TB24: 25.0000 mg | ORAL_TABLET | ORAL | 11 refills | Status: DC

## 2016-10-13 MED ORDER — CLOPIDOGREL BISULFATE 75 MG PO TABS: 75.0000 mg | ORAL_TABLET | ORAL | 11 refills | Status: DC

## 2016-10-13 NOTE — Telephone Encounter (Signed)
Pt needs refills on metroprolol and clopidogrel sent to [facility].  He doesn't have insurance and it's cheaper there.  His call back number [phone]

## 2016-10-13 NOTE — Telephone Encounter (Signed)
Metoprolol has not been managed by our office. Dr.Karamalegos is out of the office until Wednesday. Patient has enough medication to last this week. Patient needs both Metoprolol and Plavix sent to [facility] [address].

## 2016-10-13 NOTE — Telephone Encounter (Signed)
Out of office. Agree with refills for Metoprolol XL 25mg  daily and Plavix 75mg  daily. He is followed by Cardiology, however our office had previously rx'd Plavix before.  Will send both refills #30 day supply +11 refills to [facility]  Saralyn Pilar, DO [facility] 10/13/2016, 3:41 PM

## 2016-10-15 NOTE — Progress Notes (Signed)
[facility] Consultation      Assessment and Plan:  Obstructive Sleep Apnea  -Sleep study positive for OSA, requiring Bipap of 22/18 using large FP Simplus FF mask.    Obesity. -BMI equals 44. -Can contribute to obstructive sleep apnea, discussed importance of weight loss.  Ischemic cardiomyopathy with systolic CHF.  Essential hypertension. Stroke. -Above conditions can be contributed to by obstructive sleep apnea, therefore, it is important to  adequately treat the patient's obstructive sleep apnea.   Date: 10/15/2016  MRN# [id number] Kent Stanley. [birth year]  Kent Stanley. is a 46 y.o. old male seen in consultation for chief complaint of:    Chief Complaint  Patient presents with  . Follow-up    pt wearing cpap avg 6.5hr nightly, feels pressure and mask are okay. WUJ:[id number]    HPI:   The patient is a 46 year old male with a history of cerebellar stroke, cardiomyopathy, substance abuse, noncompliance with therapy, morbid obesity, diabetes mellitus.. At last visit was noted that he has symptoms and signs of obstructive sleep apnea, the patient was sent for sleep study which showed severe obstructive sleep apnea, the patient was started on Bipap of 22/18 using large FP Simplus FF mask. Patient is currently feeling much better since being on his BiPAP device. He notes he has much more energy, is able to do more during the day, he is no longer snoring, he is able to exercise during the day now.   echocardiogram  03/28/16: EF equals 20% chest x-ray  03/27/16: Mild pulmonary edema, cardiomegaly.  10/16/16. 2 month compliance download personally reviewed; average usage 5 hours 8 minutes. Over the past 2 months; compliance is 67%, average usage equals 5 hours and 8 minutes, however, this is significantly improved over the last 1 month. current settings 22/18. Residual AHI equals 2.1 -Over the last 30 days compliance of greater than 4 hours equals 87%;  average usage is 6 hours and 2 minutes. Residual AHI is 1.3.  PMHX:   Past Medical History:  Diagnosis Date  . Cerebellar Stroke    a. 08/2015 MRI: Acute/subacute L superior cerebellar infarct. Remote R cerebellar infarct;  b. 08/2015 Carotid U/S: normal carotids.  . Chronic systolic heart failure ([facility])    a. 10/2012 Echo: EF <20%, mod to sev dil LV, diast dysfxn, mildly dil LA/RA, mod to severe MR/TR.  . CKD (chronic kidney disease), stage II   . Coronary artery disease, non-occlusive    a. 09/2014 Cath: minor luminal irregularities with severely dilated left ventricle. LVEDP 30.  [name] H/O noncompliance with medical treatment, presenting hazards to health   . History of traumatic injury of head   . Hyperlipidemia   . Hypertension   . Morbid obesity ([facility])   . NICM (nonischemic cardiomyopathy) ([facility])    a. 10/2012 Echo: EF <20%.  . Polysubstance abuse    a. cocaine, tobacco, THC, ETOH  . Seizure disorder ([facility])   . Stroke ([facility])   . Type II diabetes mellitus ([facility])    Surgical Hx:  Past Surgical History:  Procedure Laterality Date  . CARDIAC CATHETERIZATION     [facility]   Family Hx:  Family History  Problem Relation Age of Onset  . Diabetes Mother   . Brain cancer Mother   . Heart attack Mother   . Sleep apnea Mother   . Heart failure Mother   . Hypertension Father   . Diabetes Father   . Heart disease Father   . Hyperlipidemia  Father   . Heart attack Father   . Heart failure Father   . Sleep apnea Father   . Sleep apnea Sister   . Sleep apnea Brother   . Heart failure Maternal Aunt    Social Hx:   Social History  Substance Use Topics  . Smoking status: Former Smoker    Years: 10.00    Types: Cigarettes    Quit date: 08/07/2015  . Smokeless tobacco: Never Used  . Alcohol use No     Comment: socially   Medication:   Reviewed.     Allergies:  Tomato  Review of Systems: Gen:  Denies  fever, sweats, chills HEENT: Denies blurred vision, double vision. bleeds, sore  throat Cvc:  No dizziness, chest pain. Resp:   Denies cough or sputum production, shortness of breath Gi: Denies swallowing difficulty, stomach pain. Gu:  Denies bladder incontinence, burning urine Ext:   No Joint pain, stiffness. Skin: No skin rash,  hives  Endoc:  No polyuria, polydipsia. Psych: No depression, insomnia. Other:  All other systems were reviewed with the patient and were negative other that what is mentioned in the HPI.   Physical Examination:   VS: BP 128/72 (BP Location: Left Arm, Cuff Size: Normal)   Pulse (!) 50   Ht 6' (1.829 m)   Wt (!) 318 lb 3.2 oz (144.3 kg)   SpO2 95%   BMI 43.16 kg/m   General Appearance: No distress  Neuro:without focal findings,  speech normal,  HEENT: PERRLA, EOM intact.  Mallampati 3 Pulmonary: normal breath sounds, No wheezing.  CardiovascularNormal S1,S2.  No m/r/g.   Abdomen: Benign, Soft, non-tender. Renal:  No costovertebral tenderness  GU:  No performed at this time. Endoc: No evident thyromegaly, no signs of acromegaly. Skin:   warm, no rashes, no ecchymosis  Extremities: normal, no cyanosis, clubbing.  Other findings:    LABORATORY PANEL:   CBC No results for input(s): WBC, HGB, HCT, PLT in the last 168 hours. ------------------------------------------------------------------------------------------------------------------  Chemistries  No results for input(s): NA, K, CL, CO2, GLUCOSE, BUN, CREATININE, CALCIUM, MG, AST, ALT, ALKPHOS, BILITOT in the last 168 hours.  Invalid input(s): GFRCGP ------------------------------------------------------------------------------------------------------------------  Cardiac Enzymes No results for input(s): TROPONINI in the last 168 hours. ------------------------------------------------------------  RADIOLOGY:  No results found.     Thank  you for the consultation and for allowing [facility] to assist in the care of your patient. Our  recommendations are noted above.  Please contact us if we can be of further service.   Wells Guiles, MD.  Board Certified in Internal Medicine, Pulmonary Medicine, Critical Care Medicine, and Sleep Medicine.  [facility] Office Number: [phone]  Santiago Glad, M.D.  Stephanie Acre, M.D.  Billy Fischer, M.D  10/15/2016

## 2016-10-16 ENCOUNTER — Encounter: Payer: Self-pay | Admitting: Internal Medicine

## 2016-10-17 ENCOUNTER — Ambulatory Visit (INDEPENDENT_AMBULATORY_CARE_PROVIDER_SITE_OTHER): Payer: Medicaid Other | Admitting: Internal Medicine

## 2016-10-17 ENCOUNTER — Encounter: Payer: Self-pay | Admitting: Internal Medicine

## 2016-10-17 VITALS — BP 128/72 | HR 50 | Ht 72.0 in | Wt 318.2 lb

## 2016-10-17 DIAGNOSIS — G4733 Obstructive sleep apnea (adult) (pediatric): Secondary | ICD-10-CM

## 2016-10-17 NOTE — Patient Instructions (Addendum)
--  Continue to use PAP every night, goal is to wear it every night for the entire night.

## 2016-10-24 ENCOUNTER — Other Ambulatory Visit: Payer: Self-pay

## 2016-10-24 ENCOUNTER — Ambulatory Visit (INDEPENDENT_AMBULATORY_CARE_PROVIDER_SITE_OTHER): Payer: Medicaid Other

## 2016-10-24 DIAGNOSIS — I5022 Chronic systolic (congestive) heart failure: Secondary | ICD-10-CM

## 2016-10-27 ENCOUNTER — Other Ambulatory Visit: Payer: Self-pay

## 2016-10-27 DIAGNOSIS — E118 Type 2 diabetes mellitus with unspecified complications: Secondary | ICD-10-CM

## 2016-10-28 MED ORDER — MECLIZINE HCL 25 MG PO TABS: 25.0000 mg | ORAL_TABLET | Freq: Three times a day (TID) | ORAL | 1 refills | Status: DC | PRN

## 2016-10-29 ENCOUNTER — Other Ambulatory Visit: Payer: Self-pay | Admitting: Family Medicine

## 2016-10-29 ENCOUNTER — Other Ambulatory Visit: Payer: Self-pay | Admitting: Family

## 2016-10-29 DIAGNOSIS — E118 Type 2 diabetes mellitus with unspecified complications: Secondary | ICD-10-CM

## 2016-11-04 ENCOUNTER — Ambulatory Visit: Payer: Medicaid Other | Admitting: Internal Medicine

## 2016-11-05 ENCOUNTER — Telehealth: Payer: Self-pay | Admitting: Family Medicine

## 2016-11-05 DIAGNOSIS — N5201 Erectile dysfunction due to arterial insufficiency: Secondary | ICD-10-CM

## 2016-11-05 MED ORDER — SILDENAFIL CITRATE 20 MG PO TABS: ORAL_TABLET | ORAL | 2 refills | Status: DC

## 2016-11-05 NOTE — Telephone Encounter (Signed)
Last rx was printed and patient brough to [facility]. New refill Sildenafil sent via e-script today.  Saralyn Pilar, DO [facility] 11/05/2016, 12:26 PM

## 2016-11-05 NOTE — Telephone Encounter (Signed)
Pt needs a refill on sildenafil sent to [facility].  His call back number is [phone]

## 2016-11-11 ENCOUNTER — Encounter: Payer: Self-pay | Admitting: *Deleted

## 2016-11-11 ENCOUNTER — Encounter: Payer: Self-pay | Admitting: Internal Medicine

## 2016-11-11 ENCOUNTER — Ambulatory Visit (INDEPENDENT_AMBULATORY_CARE_PROVIDER_SITE_OTHER): Payer: Self-pay | Admitting: Internal Medicine

## 2016-11-11 VITALS — BP 114/84 | HR 81 | Ht 72.0 in | Wt 321.0 lb

## 2016-11-11 DIAGNOSIS — Z01812 Encounter for preprocedural laboratory examination: Secondary | ICD-10-CM

## 2016-11-11 DIAGNOSIS — I5022 Chronic systolic (congestive) heart failure: Secondary | ICD-10-CM

## 2016-11-11 NOTE — Patient Instructions (Signed)
Medication Instructions: - Your physician recommends that you continue on your current medications as directed. Please refer to the Current Medication list given to you today.  Labwork: - Your physician recommends that you return for lab work: the week of 12/01/16- BMP/CBC/INR  Procedures/Testing: - Your physician has recommended that you have a defibrillator inserted. An implantable cardioverter defibrillator (ICD) is a small device that is placed in your chest or, in rare cases, your abdomen. This device uses electrical pulses or shocks to help control life-threatening, irregular heartbeats that could lead the heart to suddenly stop beating (sudden cardiac arrest). Leads are attached to the ICD that goes into your heart. This is done in the hospital and usually requires an overnight stay. Please see the instruction sheet given to you today for more information.  Follow-Up: - Your physician recommends that you schedule a follow-up appointment in: about 14 days (from 12/08/16) for a wound check with the [facility].    Any Additional Special Instructions Will Be Listed Below (If Applicable).     If you need a refill on your cardiac medications before your next appointment, please call your pharmacy.

## 2016-11-11 NOTE — Progress Notes (Signed)
ELECTROPHYSIOLOGY CONSULT NOTE  Patient ID: Kent Stanley., MRN: [id number], DOB/AGE: Jan 25, 1971 46 y.o. Admit date: (Not on file) Date of Consult: 11/11/2016  Primary Physician: Olin Hauser, DO Primary Cardiologist: MA   [name]. is being seen today for the evaluation of ICD consideration at the request of M Adrida   HPI Kent Stanley. is a 46 y.o. male  Referred for consideration of an ICD. He has a nonischemic cardiomyopathy initially identified 2014.  This initially manifested with shortness of breath. He has been over the years increasingly compliant with medications and has been on Entresto for the last number of months which is improved symptoms considerably. There've been no interval hospitalizations. He has no dyspnea on exertion but his limited ambulation by 2 prior strokes.  He has trivial peripheral edema. He has treated sleep apnea.No history of syncope or tachycardia palpitations  V DATE TEST    4/14*    Echo   EF 20 %   2016 Cath  No obstructive CAD  9/17    Echo   EF 20-25 %   4/18 Echo EF 15-20% Mild LAE    Reports of demonstrated variable degrees of mitral and tricuspid regurgitation initially quite severe more recently not evident at all  He is a very strong family history of heart disease, but the mechanisms are not clear. He had a sister with a heart attack. She survived in hospital a week. Details are not available. Both of his sons have heart problems. Both his parents died of cardiovascular disease, strokes and MI.   Past Medical History:  Diagnosis Date  . Cerebellar Stroke    a. 08/2015 MRI: Acute/subacute L superior cerebellar infarct. Remote R cerebellar infarct;  b. 08/2015 Carotid U/S: normal carotids.  . Chronic systolic heart failure ([place])    a. 10/2012 Echo: EF <20%, mod to sev dil LV, diast dysfxn, mildly dil LA/RA, mod to severe MR/TR.  . CKD (chronic kidney disease), stage II   . Coronary artery  disease, non-occlusive    a. 09/2014 Cath: minor luminal irregularities with severely dilated left ventricle. LVEDP 30.  [name] H/O noncompliance with medical treatment, presenting hazards to health   . History of traumatic injury of head   . Hyperlipidemia   . Hypertension   . Morbid obesity ([place])   . NICM (nonischemic cardiomyopathy) ([place])    a. 10/2012 Echo: EF <20%.  . Polysubstance abuse    a. cocaine, tobacco, THC, ETOH  . Seizure disorder ([place])   . Stroke ([place])   . Type II diabetes mellitus ([place])       Surgical History:  Past Surgical History:  Procedure Laterality Date  . CARDIAC CATHETERIZATION     [facility]     Home Meds: Prior to Admission medications   Medication Sig Start Date End Date Taking? Authorizing Provider  albuterol (PROVENTIL HFA;VENTOLIN HFA) 108 (90 Base) MCG/ACT inhaler Inhale 2 puffs into the lungs every 6 (six) hours as needed for wheezing or shortness of breath. 03/28/16   Luciana Axe, NP  blood glucose meter kit and supplies KIT Dispense based on patient and insurance preference. Check blood glucose once daily. 08/21/15   Luciana Axe, NP  clopidogrel (PLAVIX) 75 MG tablet Take 1 tablet (75 mg total) by mouth every morning. 10/13/16   Karamalegos, [name], DO  furosemide (LASIX) 40 MG tablet Take 1 tablet (40 mg total) by mouth daily. 09/23/16 12/22/16  Fletcher [name],  Mertie Clause, MD  glipiZIDE (GLUCOTROL) 10 MG tablet Take 1 tablet (10 mg total) by mouth daily before breakfast. 06/10/16   Parks Ranger, [name], DO  meclizine (ANTIVERT) 25 MG tablet take 1 tablet by mouth three times a day if needed for dizziness or nausea 10/29/16   Parks Ranger, [name], DO  metoprolol succinate (TOPROL-XL) 25 MG 24 hr tablet Take 1 tablet (25 mg total) by mouth every morning. 10/13/16   Karamalegos, [name], DO  nitroGLYCERIN (NITROSTAT) 0.4 MG SL tablet Place 1 tablet (0.4 mg total) under the tongue every 5 (five) minutes as needed for chest pain. 03/28/16   Loletha Grayer, MD  oxyCODONE-acetaminophen (PERCOCET) 5-325 MG tablet Take 2 tablets by mouth every 6 (six) hours as needed for moderate pain or severe pain. 09/08/16   Earleen Newport, MD  pravastatin (PRAVACHOL) 80 MG tablet Take 1 tablet (80 mg total) by mouth daily at 6 PM. 09/18/16   Karamalegos, [name], DO  sacubitril-valsartan (ENTRESTO) 49-51 MG Take 1 tablet by mouth 2 (two) times daily. 05/19/16   Alisa Graff, FNP  sildenafil (REVATIO) 20 MG tablet Take 1-5 pills about 30 min prior to sex. Start with 1 and increase as needed. Caution stop taking if chest pain. Do not take with Nitro. 11/05/16   Karamalegos, [name], DO  spironolactone (ALDACTONE) 25 MG tablet Take 0.5 tablets (12.5 mg total) by mouth daily. 05/27/16   Wellington Hampshire, MD    Allergies:  Allergies  Allergen Reactions  . Tomato Rash    Social History   Social History  . Marital status: Single    Spouse name: N/A  . Number of children: N/A  . Years of education: N/A   Occupational History  . Not on file.   Social History Main Topics  . Smoking status: Current Some Day Smoker    Years: 10.00    Types: Cigarettes  . Smokeless tobacco: Never Used     Comment: smokes 7 cigarettes monthly 10-17-16  . Alcohol use Yes     Comment: 12 beers a month per pt  . Drug use: No  . Sexual activity: Not Currently   Other Topics Concern  . Not on file   Social History Narrative  . No narrative on file     Family History  Problem Relation Age of Onset  . Diabetes Mother   . Brain cancer Mother   . Heart attack Mother   . Sleep apnea Mother   . Heart failure Mother   . Hypertension Father   . Diabetes Father   . Heart disease Father   . Hyperlipidemia Father   . Heart attack Father   . Heart failure Father   . Sleep apnea Father   . Sleep apnea Sister   . Sleep apnea Brother   . Heart failure Maternal Aunt      ROS:  Please see the history of present illness.     All other systems reviewed and  negative.    Physical Exam:  Blood pressure 114/84, pulse 81, height 6' (1.829 m), weight (!) 321 lb (145.6 kg). General: Well developed, Morbidly obese  male in no acute distress. Head: Normocephalic, atraumatic, sclera non-icteric, no xanthomas, nares are without discharge. EENT: normal  Lymph Nodes:  none Neck: Negative for carotid bruits. JVD not elevated. Back:without scoliosis kyphosis Lungs: Clear bilaterally to auscultation without wheezes, rales, or rhonchi. Breathing is unlabored. Heart: RRR with S1 S2. No  murmur . No  rubs, or gallops appreciated. Abdomen: Soft, non-tender, non-distended with normoactive bowel sounds. No hepatomegaly. No rebound/guarding. No obvious abdominal masses. Msk:  Strength and tone appear normal for age. Extremities: No clubbing or cyanosis. No edema.  Distal pedal pulses are 2+ and equal bilaterally. Skin: Warm and Dry Neuro: Alert and oriented X 3. CN III-XII intact Grossly normal sensory and motor function . Walks with a cane Psych:  Responds to questions appropriately with a normal affect.      Labs: Cardiac Enzymes No results for input(s): CKTOTAL, CKMB, TROPONINI in the last 72 hours. CBC Lab Results  Component Value Date   WBC 6.4 09/08/2016   HGB 14.3 09/08/2016   HCT 42.8 09/08/2016   MCV 98.5 09/08/2016   PLT 243 09/08/2016   PROTIME: No results for input(s): LABPROT, INR in the last 72 hours. Chemistry No results for input(s): NA, K, CL, CO2, BUN, CREATININE, CALCIUM, PROT, BILITOT, ALKPHOS, ALT, AST, GLUCOSE in the last 168 hours.  Invalid input(s): LABALBU Lipids Lab Results  Component Value Date   CHOL 127 02/12/2016   HDL 28 (L) 02/12/2016   LDLCALC 77 02/12/2016   TRIG 108 02/12/2016   BNP No results found for: PROBNP Thyroid Function Tests: No results for input(s): TSH, T4TOTAL, T3FREE, THYROIDAB in the last 72 hours.  Invalid input(s): FREET3 Miscellaneous No results found for: DDIMER  Radiology/Studies:    No results found.  EKG:  Sinus rhythm at 81 Intervals 16/09/42 Nonspecific T-wave inversion   Assessment and Plan:   NICM  Congestive heart failure-chronic-systolic  Sleep apnea-treated  Stroke-2  Morbid obesity  The patient is well treated with guidelines directed therapy. I am not aware of interval recommendations as to the role of hydralazine nitrates in the context of Entresto therapy. I will be to explore this. He is otherwise euvolemic and without symptoms of lightheadedness.  He still has class II heart failure symptoms. Hence, with his persistent LV dysfunction and his young age, he is a candidate for ICD implantation for primary prevention based on both recommendations as well as the [place] Trial   Have reviewed the potential benefits and risks of ICD implantation including but not limited to death, perforation of heart or lung, lead dislodgement, infection,  device malfunction and inappropriate shocks.  The patientexpress understanding  and are willing to proceed.     We will use a device that will also allow [place] to monitor for atrial fibrillation; he is currently taking antiplatelet therapy but if he were to have atrial fibrillation as suggested by his prior history of mitral regurgitation and left atrial enlargement and anticoagulation would be indicated. I don't think we need to use a dual-chamber device.    Virl Axe

## 2016-11-12 ENCOUNTER — Ambulatory Visit: Payer: Medicaid Other

## 2016-11-18 ENCOUNTER — Encounter: Payer: Self-pay | Admitting: Family

## 2016-11-18 ENCOUNTER — Ambulatory Visit: Payer: Self-pay | Attending: Family | Admitting: Family

## 2016-11-18 VITALS — BP 118/83 | HR 86 | Resp 20 | Ht 72.0 in | Wt 324.5 lb

## 2016-11-18 DIAGNOSIS — G4733 Obstructive sleep apnea (adult) (pediatric): Secondary | ICD-10-CM

## 2016-11-18 DIAGNOSIS — R42 Dizziness and giddiness: Secondary | ICD-10-CM | POA: Insufficient documentation

## 2016-11-18 DIAGNOSIS — Z7984 Long term (current) use of oral hypoglycemic drugs: Secondary | ICD-10-CM | POA: Insufficient documentation

## 2016-11-18 DIAGNOSIS — I10 Essential (primary) hypertension: Secondary | ICD-10-CM

## 2016-11-18 DIAGNOSIS — E1122 Type 2 diabetes mellitus with diabetic chronic kidney disease: Secondary | ICD-10-CM | POA: Insufficient documentation

## 2016-11-18 DIAGNOSIS — Z7982 Long term (current) use of aspirin: Secondary | ICD-10-CM | POA: Insufficient documentation

## 2016-11-18 DIAGNOSIS — G40909 Epilepsy, unspecified, not intractable, without status epilepticus: Secondary | ICD-10-CM | POA: Insufficient documentation

## 2016-11-18 DIAGNOSIS — Z8249 Family history of ischemic heart disease and other diseases of the circulatory system: Secondary | ICD-10-CM | POA: Insufficient documentation

## 2016-11-18 DIAGNOSIS — Z833 Family history of diabetes mellitus: Secondary | ICD-10-CM | POA: Insufficient documentation

## 2016-11-18 DIAGNOSIS — I5022 Chronic systolic (congestive) heart failure: Secondary | ICD-10-CM

## 2016-11-18 DIAGNOSIS — I428 Other cardiomyopathies: Secondary | ICD-10-CM | POA: Insufficient documentation

## 2016-11-18 DIAGNOSIS — I130 Hypertensive heart and chronic kidney disease with heart failure and stage 1 through stage 4 chronic kidney disease, or unspecified chronic kidney disease: Secondary | ICD-10-CM | POA: Insufficient documentation

## 2016-11-18 DIAGNOSIS — N182 Chronic kidney disease, stage 2 (mild): Secondary | ICD-10-CM | POA: Insufficient documentation

## 2016-11-18 DIAGNOSIS — Z79899 Other long term (current) drug therapy: Secondary | ICD-10-CM | POA: Insufficient documentation

## 2016-11-18 DIAGNOSIS — F17210 Nicotine dependence, cigarettes, uncomplicated: Secondary | ICD-10-CM | POA: Insufficient documentation

## 2016-11-18 DIAGNOSIS — Z8673 Personal history of transient ischemic attack (TIA), and cerebral infarction without residual deficits: Secondary | ICD-10-CM | POA: Insufficient documentation

## 2016-11-18 DIAGNOSIS — E785 Hyperlipidemia, unspecified: Secondary | ICD-10-CM | POA: Insufficient documentation

## 2016-11-18 DIAGNOSIS — I2510 Atherosclerotic heart disease of native coronary artery without angina pectoris: Secondary | ICD-10-CM | POA: Insufficient documentation

## 2016-11-18 DIAGNOSIS — Z6841 Body Mass Index (BMI) 40.0 and over, adult: Secondary | ICD-10-CM | POA: Insufficient documentation

## 2016-11-18 MED ORDER — FUROSEMIDE 40 MG PO TABS: 40.0000 mg | ORAL_TABLET | Freq: Every day | ORAL | 5 refills | Status: DC

## 2016-11-18 NOTE — Progress Notes (Signed)
Patient ID: Kent Kindred., male    DOB: [birth year], 46 y.o.   MRN: [id number]  HPI  Kent Stanley is a 46 y/o male with a history of recent stroke (Feb 2017), CKD stage 2, CAD, hyperlipidemia, HTN, morbid obesity, polysubstance abuse in the past, prior tobacco use, seizures, DM and chronic heart failure.  Last echo done 10/24/16 which showed an EF of 15-20%. EF has declined from previous echo which was done 03/28/16 which showed an EF of 20-25% without regurgitation. Last catheterization was done March 2016.  Was in the ED 09/08/16 with acute left shoulder pain. Treated and released the same day. Admitted on 03/27/16 with heart failure exacerbation. Was treated with IV diuretics and spironolactone was added. Discussion about possible ICD in the future if the EF remains low. Discharged the next day.  He presents today for his follow-up visit with a chief complaint of light-headedness. He describes it as intermittent but has been present for many months. He doesn't feel like it's any worse or better. He has associated infrequent angina with this.   Past Medical History:  Diagnosis Date  . Cerebellar Stroke    a. 08/2015 MRI: Acute/subacute L superior cerebellar infarct. Remote R cerebellar infarct;  b. 08/2015 Carotid U/S: normal carotids.  . Chronic systolic heart failure ([place])    a. 10/2012 Echo: EF <20%, mod to sev dil LV, diast dysfxn, mildly dil LA/RA, mod to severe Kent/TR.  . CKD (chronic kidney disease), stage II   . Coronary artery disease, non-occlusive    a. 09/2014 Cath: minor luminal irregularities with severely dilated left ventricle. LVEDP 30.  [name] H/O noncompliance with medical treatment, presenting hazards to health   . History of traumatic injury of head   . Hyperlipidemia   . Hypertension   . Morbid obesity ([place])   . NICM (nonischemic cardiomyopathy) ([place])    a. 10/2012 Echo: EF <20%.  . Polysubstance abuse    a. cocaine, tobacco, THC, ETOH  . Seizure disorder ([place])   . Stroke  ([place])   . Type II diabetes mellitus ([place])    Past Surgical History:  Procedure Laterality Date  . CARDIAC CATHETERIZATION     [facility]   Family History  Problem Relation Age of Onset  . Diabetes Mother   . Brain cancer Mother   . Heart attack Mother   . Sleep apnea Mother   . Heart failure Mother   . Hypertension Father   . Diabetes Father   . Heart disease Father   . Hyperlipidemia Father   . Heart attack Father   . Heart failure Father   . Sleep apnea Father   . Sleep apnea Sister   . Sleep apnea Brother   . Heart failure Maternal Aunt    Social History  Substance Use Topics  . Smoking status: Current Some Day Smoker    Years: 10.00    Types: Cigarettes  . Smokeless tobacco: Never Used     Comment: smokes 7 cigarettes monthly 10-17-16  . Alcohol use Yes     Comment: 12 beers a month per pt   Allergies  Allergen Reactions  . Tomato Rash   Prior to Admission medications   Medication Sig Start Date End Date Taking? Authorizing Provider  albuterol (PROVENTIL HFA;VENTOLIN HFA) 108 (90 Base) MCG/ACT inhaler Inhale 2 puffs into the lungs every 6 (six) hours as needed for wheezing or shortness of breath. 03/28/16  Yes Amy Overton Mam, NP  aspirin EC 81 MG  tablet Take 1 tablet (81 mg total) by mouth daily. 06/10/16  Yes Alexander Devin Going, DO  blood glucose meter kit and supplies KIT Dispense based on patient and insurance preference. Check blood glucose once daily. 08/21/15  Yes Amy Overton Mam, NP  clopidogrel (PLAVIX) 75 MG tablet Take 1 tablet (75 mg total) by mouth daily. Patient taking differently: Take 75 mg by mouth every morning.  02/12/16  Yes Amy Overton Mam, NP  furosemide (LASIX) 40 MG tablet Take 1 tablet (40 mg total) by mouth 2 (two) times daily. 08/20/16  Yes Alisa Graff, FNP  glipiZIDE (GLUCOTROL) 10 MG tablet Take 1 tablet (10 mg total) by mouth daily before breakfast. 06/10/16  Yes Olin Hauser, DO  meclizine (ANTIVERT) 25 MG tablet Take 1  tablet (25 mg total) by mouth 3 (three) times daily as needed for dizziness or nausea. 08/20/16  Yes Alisa Graff, FNP  metoprolol succinate (TOPROL-XL) 25 MG 24 hr tablet Take 25 mg by mouth every morning.    Yes Historical Provider, MD  nitroGLYCERIN (NITROSTAT) 0.4 MG SL tablet Place 1 tablet (0.4 mg total) under the tongue every 5 (five) minutes as needed for chest pain. 03/28/16  Yes Loletha Grayer, MD  pravastatin (PRAVACHOL) 80 MG tablet Take 1 tablet (80 mg total) by mouth daily at 6 PM. 09/12/15  Yes Amy Overton Mam, NP  sacubitril-valsartan (ENTRESTO) 49-51 MG Take 1 tablet by mouth 2 (two) times daily. 05/19/16  Yes Alisa Graff, FNP  sildenafil (REVATIO) 20 MG tablet Take 1-5 pills about 30 min prior to sex. Start with 1 and increase as needed. Caution stop taking if chest pain. Do not take with Nitro. 07/30/16  Yes Alexander Devin Going, DO  spironolactone (ALDACTONE) 25 MG tablet Take 0.5 tablets (12.5 mg total) by mouth daily. 05/27/16  Yes Wellington Hampshire, MD   Review of Systems  Constitutional: Negative for appetite change and fatigue.  HENT: Negative for congestion, postnasal drip and sore throat.   Eyes: Negative.   Respiratory: Negative for chest tightness, shortness of breath and wheezing.   Cardiovascular: Positive for chest pain (angina at times). Negative for palpitations and leg swelling.  Gastrointestinal: Negative for abdominal distention and abdominal pain.  Endocrine: Negative.   Genitourinary: Negative.   Musculoskeletal: Negative for back pain and neck pain.  Skin: Negative.   Allergic/Immunologic: Negative.   Neurological: Positive for light-headedness. Negative for dizziness.  Hematological: Negative for adenopathy. Does not bruise/bleed easily.  Psychiatric/Behavioral: Negative for dysphoric mood and sleep disturbance (sleeping with CPAP about 5-6 hours a night). The patient is not nervous/anxious.    Vitals:   11/18/16 1035  BP: 118/83  Pulse: 86   Resp: 20  SpO2: 98%  Weight: (!) 324 lb 8 oz (147.2 kg)  Height: 6' (1.829 m)   Wt Readings from Last 3 Encounters:  11/18/16 (!) 324 lb 8 oz (147.2 kg)  11/11/16 (!) 321 lb (145.6 kg)  10/17/16 (!) 318 lb 3.2 oz (144.3 kg)    Lab Results  Component Value Date   CREATININE 1.88 (H) 09/08/2016   CREATININE 1.44 (H) 05/19/2016   CREATININE 1.62 (H) 04/08/2016    Physical Exam  Constitutional: He is oriented to person, place, and time. He appears well-developed and well-nourished.  HENT:  Head: Normocephalic and atraumatic.  Neck: Normal range of motion. Neck supple. No JVD present.  Cardiovascular: An irregular rhythm present. Bradycardia present.   Pulmonary/Chest: Effort normal. He has no wheezes. He has  no rales.  Abdominal: Soft. He exhibits no distension. There is no tenderness.  Musculoskeletal: He exhibits no edema or tenderness.  Neurological: He is alert and oriented to person, place, and time.  Skin: Skin is warm and dry.  Psychiatric: He has a normal mood and affect. His behavior is normal. Thought content normal.  Nursing note and vitals reviewed.   Assessment & Plan:  1: Chronic heart failure with reduced ejection fraction- - NYHA Class I - euvolemic today - home weight down to 318 pounds. To call for an overnight weight gain of >2 pounds or a weekly weight gain of >5 pounds - Using Mrs. Dash, does use regular salt on popcorn (infrequent).  - now riding stationary bike for 55 minutes 5 days a week - taking furosemide 27m AM and 211mevery other PM. Tried taking 4074must in the morning and he noticed swelling and weight gain - Saw cardiologist (ArFletcher [name]/20/18 - saw EP (KlCaryl Comes/8/18 - may titrate up entresto after AICD is placed on 12/08/16  2: HTN- - BP looks good today  3: Obstructive sleep apnea- - wearing CPAP nightly  - continues to sleep well  Medication bottles were reviewed.  Return in 3 months or sooner for any questions/problems before then.

## 2016-11-18 NOTE — Patient Instructions (Signed)
Continue weighing daily and call for an overnight weight gain of > 2 pounds or a weekly weight gain of >5 pounds. 

## 2016-11-20 ENCOUNTER — Ambulatory Visit: Payer: Medicaid Other | Admitting: Family

## 2016-11-25 ENCOUNTER — Ambulatory Visit: Payer: Medicaid Other | Admitting: Internal Medicine

## 2016-11-25 ENCOUNTER — Other Ambulatory Visit: Payer: Self-pay | Admitting: Family

## 2016-11-28 ENCOUNTER — Other Ambulatory Visit: Payer: Self-pay | Admitting: Family Medicine

## 2016-12-02 ENCOUNTER — Other Ambulatory Visit: Payer: Self-pay | Admitting: *Deleted

## 2016-12-02 DIAGNOSIS — I5022 Chronic systolic (congestive) heart failure: Secondary | ICD-10-CM

## 2016-12-02 DIAGNOSIS — Z01812 Encounter for preprocedural laboratory examination: Secondary | ICD-10-CM

## 2016-12-03 ENCOUNTER — Telehealth: Payer: Self-pay | Admitting: Family Medicine

## 2016-12-03 DIAGNOSIS — E118 Type 2 diabetes mellitus with unspecified complications: Secondary | ICD-10-CM

## 2016-12-03 DIAGNOSIS — Z8673 Personal history of transient ischemic attack (TIA), and cerebral infarction without residual deficits: Secondary | ICD-10-CM

## 2016-12-03 DIAGNOSIS — I5022 Chronic systolic (congestive) heart failure: Secondary | ICD-10-CM

## 2016-12-03 LAB — CBC WITH DIFFERENTIAL/PLATELET
BASOS: 0 %
Basophils Absolute: 0 10*3/uL (ref 0.0–0.2)
EOS (ABSOLUTE): 0.1 10*3/uL (ref 0.0–0.4)
EOS: 1 %
HEMATOCRIT: 42.8 % (ref 37.5–51.0)
HEMOGLOBIN: 14.8 g/dL (ref 13.0–17.7)
IMMATURE GRANS (ABS): 0 10*3/uL (ref 0.0–0.1)
IMMATURE GRANULOCYTES: 0 %
LYMPHS: 23 %
Lymphocytes Absolute: 1.7 10*3/uL (ref 0.7–3.1)
MCH: 32.2 pg (ref 26.6–33.0)
MCHC: 34.6 g/dL (ref 31.5–35.7)
MCV: 93 fL (ref 79–97)
MONOCYTES: 10 %
Monocytes Absolute: 0.7 10*3/uL (ref 0.1–0.9)
NEUTROS ABS: 4.8 10*3/uL (ref 1.4–7.0)
NEUTROS PCT: 66 %
PLATELETS: 221 10*3/uL (ref 150–379)
RBC: 4.6 x10E6/uL (ref 4.14–5.80)
RDW: 14 % (ref 12.3–15.4)
WBC: 7.4 10*3/uL (ref 3.4–10.8)

## 2016-12-03 LAB — BASIC METABOLIC PANEL
BUN/Creatinine Ratio: 14 (ref 9–20)
BUN: 23 mg/dL (ref 6–24)
CALCIUM: 9 mg/dL (ref 8.7–10.2)
CO2: 22 mmol/L (ref 18–29)
CREATININE: 1.61 mg/dL — AB (ref 0.76–1.27)
Chloride: 105 mmol/L (ref 96–106)
GFR, EST AFRICAN AMERICAN: 58 mL/min/{1.73_m2} — AB (ref >59)
GFR, EST NON AFRICAN AMERICAN: 50 mL/min/{1.73_m2} — AB (ref >59)
Glucose: 131 mg/dL — ABNORMAL HIGH (ref 65–99)
Potassium: 4.4 mmol/L (ref 3.5–5.2)
Sodium: 141 mmol/L (ref 134–144)

## 2016-12-03 LAB — PROTIME-INR
INR: 1 (ref 0.8–1.2)
Prothrombin Time: 10.9 s (ref 9.1–12.0)

## 2016-12-03 MED ORDER — SPIRONOLACTONE 25 MG PO TABS: 12.5000 mg | ORAL_TABLET | Freq: Every day | ORAL | 6 refills | Status: DC

## 2016-12-03 MED ORDER — CLOPIDOGREL BISULFATE 75 MG PO TABS: 75.0000 mg | ORAL_TABLET | ORAL | 11 refills | Status: DC

## 2016-12-03 MED ORDER — GLIPIZIDE 10 MG PO TABS: 10.0000 mg | ORAL_TABLET | Freq: Every day | ORAL | 3 refills | Status: DC

## 2016-12-03 MED ORDER — FUROSEMIDE 40 MG PO TABS: ORAL_TABLET | ORAL | 5 refills | Status: DC

## 2016-12-03 NOTE — Telephone Encounter (Signed)
Refilled rx GLipizide, Furosemide 40mg  daily + half tab 20mg  dose every other evening, Spironolactone half tab for 12.5mg  daily. Sent to [facility] [address]  Saralyn Pilar, DO [facility] 12/03/2016, 12:11 PM

## 2016-12-03 NOTE — Telephone Encounter (Signed)
Pt needs a refill on clopidogrel sent to [facility].  He left this one off his earlier order but it does go to a different pharmacy.  His call back number is [phone]

## 2016-12-03 NOTE — Telephone Encounter (Signed)
Pt needs a 90 day supply of glipizide, furosemide and spironolactone sent to [facility] [address]  His call back number is [phone]

## 2016-12-03 NOTE — Telephone Encounter (Signed)
Refilled Clopidogrel sent to Barbaraann Barthel, DO [facility] 12/03/2016, 10:29 PM

## 2016-12-08 ENCOUNTER — Ambulatory Visit (HOSPITAL_COMMUNITY)
Admission: RE | Admit: 2016-12-08 | Discharge: 2016-12-09 | Disposition: A | Discharge status: Home / Self Care | Payer: Medicaid Other | Source: Ambulatory Visit | Attending: Internal Medicine | Admitting: Internal Medicine

## 2016-12-08 ENCOUNTER — Encounter (HOSPITAL_COMMUNITY): Payer: Self-pay | Admitting: Internal Medicine

## 2016-12-08 ENCOUNTER — Encounter (HOSPITAL_COMMUNITY)
Admission: RE | Disposition: A | Discharge status: Home / Self Care | Payer: Self-pay | Source: Ambulatory Visit | Attending: Internal Medicine

## 2016-12-08 DIAGNOSIS — N182 Chronic kidney disease, stage 2 (mild): Secondary | ICD-10-CM | POA: Diagnosis not present

## 2016-12-08 DIAGNOSIS — I5022 Chronic systolic (congestive) heart failure: Secondary | ICD-10-CM | POA: Insufficient documentation

## 2016-12-08 DIAGNOSIS — F17210 Nicotine dependence, cigarettes, uncomplicated: Secondary | ICD-10-CM | POA: Diagnosis not present

## 2016-12-08 DIAGNOSIS — I2510 Atherosclerotic heart disease of native coronary artery without angina pectoris: Secondary | ICD-10-CM | POA: Diagnosis not present

## 2016-12-08 DIAGNOSIS — Z9114 Patient's other noncompliance with medication regimen: Secondary | ICD-10-CM | POA: Insufficient documentation

## 2016-12-08 DIAGNOSIS — Z6841 Body Mass Index (BMI) 40.0 and over, adult: Secondary | ICD-10-CM | POA: Insufficient documentation

## 2016-12-08 DIAGNOSIS — E1122 Type 2 diabetes mellitus with diabetic chronic kidney disease: Secondary | ICD-10-CM | POA: Diagnosis not present

## 2016-12-08 DIAGNOSIS — E785 Hyperlipidemia, unspecified: Secondary | ICD-10-CM | POA: Diagnosis not present

## 2016-12-08 DIAGNOSIS — Z8673 Personal history of transient ischemic attack (TIA), and cerebral infarction without residual deficits: Secondary | ICD-10-CM | POA: Insufficient documentation

## 2016-12-08 DIAGNOSIS — Z7984 Long term (current) use of oral hypoglycemic drugs: Secondary | ICD-10-CM | POA: Diagnosis not present

## 2016-12-08 DIAGNOSIS — G40909 Epilepsy, unspecified, not intractable, without status epilepticus: Secondary | ICD-10-CM | POA: Diagnosis not present

## 2016-12-08 DIAGNOSIS — I429 Cardiomyopathy, unspecified: Secondary | ICD-10-CM

## 2016-12-08 DIAGNOSIS — I130 Hypertensive heart and chronic kidney disease with heart failure and stage 1 through stage 4 chronic kidney disease, or unspecified chronic kidney disease: Secondary | ICD-10-CM | POA: Insufficient documentation

## 2016-12-08 DIAGNOSIS — Z959 Presence of cardiac and vascular implant and graft, unspecified: Secondary | ICD-10-CM

## 2016-12-08 DIAGNOSIS — Z7902 Long term (current) use of antithrombotics/antiplatelets: Secondary | ICD-10-CM | POA: Diagnosis not present

## 2016-12-08 DIAGNOSIS — I428 Other cardiomyopathies: Secondary | ICD-10-CM | POA: Diagnosis not present

## 2016-12-08 DIAGNOSIS — Z8249 Family history of ischemic heart disease and other diseases of the circulatory system: Secondary | ICD-10-CM | POA: Insufficient documentation

## 2016-12-08 DIAGNOSIS — G4733 Obstructive sleep apnea (adult) (pediatric): Secondary | ICD-10-CM | POA: Insufficient documentation

## 2016-12-08 HISTORY — PX: ICD IMPLANT: EP1208

## 2016-12-08 LAB — GLUCOSE, CAPILLARY
GLUCOSE-CAPILLARY: 113 mg/dL — AB (ref 65–99)
GLUCOSE-CAPILLARY: 77 mg/dL (ref 65–99)
Glucose-Capillary: 111 mg/dL — ABNORMAL HIGH (ref 65–99)
Glucose-Capillary: 106 mg/dL — ABNORMAL HIGH (ref 65–99)

## 2016-12-08 LAB — SURGICAL PCR SCREEN
MRSA, PCR: NEGATIVE
Staphylococcus aureus: NEGATIVE

## 2016-12-08 SURGERY — ICD IMPLANT

## 2016-12-08 MED ORDER — CEFAZOLIN SODIUM-DEXTROSE 2-4 GM/100ML-% IV SOLN
INTRAVENOUS | Status: AC
  Filled 2016-12-08: qty 100

## 2016-12-08 MED ORDER — SODIUM CHLORIDE 0.9 % IV SOLN
INTRAVENOUS | Status: DC
  Administered 2016-12-08: 08:00:00 via INTRAVENOUS

## 2016-12-08 MED ORDER — MUPIROCIN 2 % EX OINT
1.0000 "application " | TOPICAL_OINTMENT | Freq: Once | CUTANEOUS | Status: AC
  Administered 2016-12-08: 1 via TOPICAL
  Filled 2016-12-08: qty 22

## 2016-12-08 MED ORDER — IBUPROFEN 200 MG PO TABS
400.0000 mg | ORAL_TABLET | Freq: Every day | ORAL | Status: DC | PRN
  Administered 2016-12-08: 800 mg via ORAL
  Filled 2016-12-08: qty 4

## 2016-12-08 MED ORDER — MUPIROCIN 2 % EX OINT
TOPICAL_OINTMENT | CUTANEOUS | Status: AC
  Administered 2016-12-08: 1 via TOPICAL
  Filled 2016-12-08: qty 22

## 2016-12-08 MED ORDER — LIDOCAINE HCL (PF) 1 % IJ SOLN
INTRAMUSCULAR | Status: AC
  Filled 2016-12-08: qty 60

## 2016-12-08 MED ORDER — ONDANSETRON HCL 4 MG/2ML IJ SOLN: 4.0000 mg | Freq: Four times a day (QID) | INTRAMUSCULAR | Status: DC | PRN

## 2016-12-08 MED ORDER — CLOPIDOGREL BISULFATE 75 MG PO TABS
75.0000 mg | ORAL_TABLET | ORAL | Status: DC
  Administered 2016-12-09: 75 mg via ORAL
  Filled 2016-12-08: qty 1

## 2016-12-08 MED ORDER — SODIUM CHLORIDE 0.9 % IR SOLN
80.0000 mg | Status: AC
  Administered 2016-12-08: 80 mg
  Filled 2016-12-08: qty 2

## 2016-12-08 MED ORDER — GLIPIZIDE 10 MG PO TABS
10.0000 mg | ORAL_TABLET | Freq: Every day | ORAL | Status: DC
  Administered 2016-12-08: 10 mg via ORAL
  Filled 2016-12-08 (×3): qty 1

## 2016-12-08 MED ORDER — ACETAMINOPHEN 325 MG PO TABS
325.0000 mg | ORAL_TABLET | ORAL | Status: DC | PRN
  Administered 2016-12-08: 650 mg via ORAL
  Filled 2016-12-08: qty 2

## 2016-12-08 MED ORDER — SODIUM CHLORIDE 0.9 % IV SOLN: INTRAVENOUS | Status: DC

## 2016-12-08 MED ORDER — MIDAZOLAM HCL 5 MG/5ML IJ SOLN
INTRAMUSCULAR | Status: AC
  Filled 2016-12-08: qty 5

## 2016-12-08 MED ORDER — SODIUM CHLORIDE 0.9 % IR SOLN
Status: AC
  Filled 2016-12-08: qty 2

## 2016-12-08 MED ORDER — SACUBITRIL-VALSARTAN 49-51 MG PO TABS
1.0000 | ORAL_TABLET | Freq: Two times a day (BID) | ORAL | Status: DC
  Administered 2016-12-08 – 2016-12-09 (×2): 1 via ORAL
  Filled 2016-12-08 (×2): qty 1

## 2016-12-08 MED ORDER — CHLORHEXIDINE GLUCONATE 4 % EX LIQD
60.0000 mL | Freq: Once | CUTANEOUS | Status: DC
  Filled 2016-12-08: qty 60

## 2016-12-08 MED ORDER — CEFAZOLIN SODIUM-DEXTROSE 2-4 GM/100ML-% IV SOLN
2.0000 g | INTRAVENOUS | Status: DC
  Filled 2016-12-08: qty 100

## 2016-12-08 MED ORDER — FUROSEMIDE 20 MG PO TABS
40.0000 mg | ORAL_TABLET | Freq: Every day | ORAL | Status: DC
  Administered 2016-12-08 – 2016-12-09 (×2): 40 mg via ORAL
  Filled 2016-12-08 (×2): qty 2

## 2016-12-08 MED ORDER — FUROSEMIDE 20 MG PO TABS
20.0000 mg | ORAL_TABLET | Freq: Every day | ORAL | Status: DC
  Administered 2016-12-08: 20 mg via ORAL
  Filled 2016-12-08 (×2): qty 1

## 2016-12-08 MED ORDER — ONDANSETRON HCL 4 MG/2ML IJ SOLN
4.0000 mg | Freq: Four times a day (QID) | INTRAMUSCULAR | Status: DC | PRN
Start: 2016-12-08 — End: 2016-12-09

## 2016-12-08 MED ORDER — METOPROLOL SUCCINATE ER 25 MG PO TB24
25.0000 mg | ORAL_TABLET | ORAL | Status: DC
  Administered 2016-12-09: 25 mg via ORAL
  Filled 2016-12-08: qty 1

## 2016-12-08 MED ORDER — CEFAZOLIN SODIUM-DEXTROSE 1-4 GM/50ML-% IV SOLN
1.0000 g | Freq: Four times a day (QID) | INTRAVENOUS | Status: AC
  Administered 2016-12-08 – 2016-12-09 (×3): 1 g via INTRAVENOUS
  Filled 2016-12-08 (×3): qty 50

## 2016-12-08 MED ORDER — FENTANYL CITRATE (PF) 100 MCG/2ML IJ SOLN
INTRAMUSCULAR | Status: AC
  Filled 2016-12-08: qty 2

## 2016-12-08 MED ORDER — SODIUM CHLORIDE 0.9 % IV SOLN: INTRAVENOUS | Status: AC

## 2016-12-08 MED ORDER — DEXTROSE 5 % IV SOLN
3.0000 g | INTRAVENOUS | Status: AC
  Administered 2016-12-08: 3 g via INTRAVENOUS
  Filled 2016-12-08: qty 3000

## 2016-12-08 MED ORDER — PRAVASTATIN SODIUM 40 MG PO TABS
40.0000 mg | ORAL_TABLET | Freq: Every day | ORAL | Status: DC
  Administered 2016-12-08: 40 mg via ORAL
  Filled 2016-12-08: qty 1

## 2016-12-08 MED ORDER — CEFAZOLIN SODIUM-DEXTROSE 1-4 GM/50ML-% IV SOLN: 1.0000 g | Freq: Four times a day (QID) | INTRAVENOUS | Status: DC

## 2016-12-08 MED ORDER — HEPARIN (PORCINE) IN NACL 2-0.9 UNIT/ML-% IJ SOLN
INTRAMUSCULAR | Status: AC | PRN
  Administered 2016-12-08: 500 mL

## 2016-12-08 MED ORDER — MIDAZOLAM HCL 5 MG/5ML IJ SOLN
INTRAMUSCULAR | Status: DC | PRN
  Administered 2016-12-08: 2 mg via INTRAVENOUS
  Administered 2016-12-08 (×2): 1 mg via INTRAVENOUS

## 2016-12-08 MED ORDER — HEPARIN (PORCINE) IN NACL 2-0.9 UNIT/ML-% IJ SOLN
INTRAMUSCULAR | Status: AC
  Filled 2016-12-08: qty 500

## 2016-12-08 MED ORDER — LIDOCAINE HCL (PF) 1 % IJ SOLN
INTRAMUSCULAR | Status: AC
  Filled 2016-12-08: qty 30

## 2016-12-08 MED ORDER — SPIRONOLACTONE 25 MG PO TABS
12.5000 mg | ORAL_TABLET | Freq: Every day | ORAL | Status: DC
  Administered 2016-12-08: 12.5 mg via ORAL
  Filled 2016-12-08 (×2): qty 1

## 2016-12-08 MED ORDER — LIDOCAINE HCL (PF) 1 % IJ SOLN
INTRAMUSCULAR | Status: DC | PRN
  Administered 2016-12-08: 70 mL via INTRADERMAL

## 2016-12-08 MED ORDER — ACETAMINOPHEN 325 MG PO TABS: 325.0000 mg | ORAL_TABLET | ORAL | Status: DC | PRN

## 2016-12-08 MED ORDER — FENTANYL CITRATE (PF) 100 MCG/2ML IJ SOLN
INTRAMUSCULAR | Status: DC | PRN
  Administered 2016-12-08 (×3): 25 ug via INTRAVENOUS

## 2016-12-08 SURGICAL SUPPLY — 10 items
CABLE SURGICAL S-101-97-12 (CABLE) ×2 IMPLANT
HEMOSTAT SURGICEL 2X4 FIBR (HEMOSTASIS) ×2 IMPLANT
HOVERMATT SINGLE USE (MISCELLANEOUS) ×2 IMPLANT
ICD VISIA MRI VR DVFB1D4 (ICD Generator) IMPLANT
LEAD RELIANCE G DF4 0293 (Lead) ×2 IMPLANT
PAD DEFIB LIFELINK (PAD) ×2 IMPLANT
SHEATH CLASSIC 9.5F (SHEATH) ×2 IMPLANT
SHEATH CLASSIC 9F (SHEATH) IMPLANT
TRAY PACEMAKER INSERTION (PACKS) ×2 IMPLANT
VISIA MRI VR DVFB1D4 (ICD Generator) ×3 IMPLANT

## 2016-12-08 NOTE — Interval H&P Note (Signed)
ICD Criteria  Current LVEF:20%. Within 12 months prior to implant: Yes   Heart failure history: Yes, Class II  Cardiomyopathy history: Yes, Non-Ischemic Cardiomyopathy.  Atrial Fibrillation/Atrial Flutter: No.  Ventricular tachycardia history: No.  Cardiac arrest history: No.  History of syndromes with risk of sudden death: No.  Previous ICD: No.  Current ICD indication: Primary  PPM indication: No.   Class I or II Bradycardia indication present: No  Beta Blocker therapy for 3 or more months: Yes, prescribed.   Ace Inhibitor/ARB therapy for 3 or more months: Yes, prescribed.   History and Physical Interval Note:  12/08/2016 9:45 AM  Kent Stanley.  has presented today for surgery, with the diagnosis of chf  The various methods of treatment have been discussed with the patient and family. After consideration of risks, benefits and other options for treatment, the patient has consented to  Procedure(s): ICD Implant (N/A) as a surgical intervention .  The patient's history has been reviewed, patient examined, no change in status, stable for surgery.  I have reviewed the patient's chart and labs.  Questions were answered to the patient's satisfaction.     Sherryl Manges

## 2016-12-08 NOTE — H&P (View-Only) (Signed)
ELECTROPHYSIOLOGY CONSULT NOTE  Patient ID: Kent Claiborne., MRN: [id number], DOB/AGE: 1970/10/17 47 y.o. Admit date: (Not on file) Date of Consult: 11/11/2016  Primary Physician: Olin Hauser, DO Primary Cardiologist: MA   Kent Kindred. is being seen today for the evaluation of ICD consideration at the request of M Adrida   HPI Kent Stanley. is a 46 y.o. male  Referred for consideration of an ICD. He has a nonischemic cardiomyopathy initially identified 2014.  This initially manifested with shortness of breath. He has been over the years increasingly compliant with medications and has been on Entresto for the last number of months which is improved symptoms considerably. There've been no interval hospitalizations. He has no dyspnea on exertion but his limited ambulation by 2 prior strokes.  He has trivial peripheral edema. He has treated sleep apnea.No history of syncope or tachycardia palpitations  V DATE TEST    4/14*    Echo   EF 20 %   2016 Cath  No obstructive CAD  9/17    Echo   EF 20-25 %   4/18 Echo EF 15-20% Mild LAE    Reports of demonstrated variable degrees of mitral and tricuspid regurgitation initially quite severe more recently not evident at all  He is a very strong family history of heart disease, but the mechanisms are not clear. He had a sister with a heart attack. She survived in hospital a week. Details are not available. Both of his sons have heart problems. Both his parents died of cardiovascular disease, strokes and MI.   Past Medical History:  Diagnosis Date  . Cerebellar Stroke    a. 08/2015 MRI: Acute/subacute L superior cerebellar infarct. Remote R cerebellar infarct;  b. 08/2015 Carotid U/S: normal carotids.  . Chronic systolic heart failure ([place])    a. 10/2012 Echo: EF <20%, mod to sev dil LV, diast dysfxn, mildly dil LA/RA, mod to severe MR/TR.  . CKD (chronic kidney disease), stage II   . Coronary artery  disease, non-occlusive    a. 09/2014 Cath: minor luminal irregularities with severely dilated left ventricle. LVEDP 30.  [name] H/O noncompliance with medical treatment, presenting hazards to health   . History of traumatic injury of head   . Hyperlipidemia   . Hypertension   . Morbid obesity ([place])   . NICM (nonischemic cardiomyopathy) ([place])    a. 10/2012 Echo: EF <20%.  . Polysubstance abuse    a. cocaine, tobacco, THC, ETOH  . Seizure disorder ([place])   . Stroke ([place])   . Type II diabetes mellitus ([place])       Surgical History:  Past Surgical History:  Procedure Laterality Date  . CARDIAC CATHETERIZATION     [facility]     Home Meds: Prior to Admission medications   Medication Sig Start Date End Date Taking? Authorizing Provider  albuterol (PROVENTIL HFA;VENTOLIN HFA) 108 (90 Base) MCG/ACT inhaler Inhale 2 puffs into the lungs every 6 (six) hours as needed for wheezing or shortness of breath. 03/28/16   Luciana Axe, NP  blood glucose meter kit and supplies KIT Dispense based on patient and insurance preference. Check blood glucose once daily. 08/21/15   Luciana Axe, NP  clopidogrel (PLAVIX) 75 MG tablet Take 1 tablet (75 mg total) by mouth every morning. 10/13/16   Karamalegos, [name], DO  furosemide (LASIX) 40 MG tablet Take 1 tablet (40 mg total) by mouth daily. 09/23/16 12/22/16  Fletcher [name],  Chelsea Aus, MD  glipiZIDE (GLUCOTROL) 10 MG tablet Take 1 tablet (10 mg total) by mouth daily before breakfast. 06/10/16   Althea Charon, Netta Neat, DO  meclizine (ANTIVERT) 25 MG tablet take 1 tablet by mouth three times a day if needed for dizziness or nausea 10/29/16   Althea Charon, Netta Neat, DO  metoprolol succinate (TOPROL-XL) 25 MG 24 hr tablet Take 1 tablet (25 mg total) by mouth every morning. 10/13/16   Karamalegos, Netta Neat, DO  nitroGLYCERIN (NITROSTAT) 0.4 MG SL tablet Place 1 tablet (0.4 mg total) under the tongue every 5 (five) minutes as needed for chest pain. 03/28/16   Alford Highland, MD  oxyCODONE-acetaminophen (PERCOCET) 5-325 MG tablet Take 2 tablets by mouth every 6 (six) hours as needed for moderate pain or severe pain. 09/08/16   Emily Filbert, MD  pravastatin (PRAVACHOL) 80 MG tablet Take 1 tablet (80 mg total) by mouth daily at 6 PM. 09/18/16   Karamalegos, Netta Neat, DO  sacubitril-valsartan (ENTRESTO) 49-51 MG Take 1 tablet by mouth 2 (two) times daily. 05/19/16   Delma Freeze, FNP  sildenafil (REVATIO) 20 MG tablet Take 1-5 pills about 30 min prior to sex. Start with 1 and increase as needed. Caution stop taking if chest pain. Do not take with Nitro. 11/05/16   Karamalegos, Netta Neat, DO  spironolactone (ALDACTONE) 25 MG tablet Take 0.5 tablets (12.5 mg total) by mouth daily. 05/27/16   Iran Ouch, MD    Allergies:  Allergies  Allergen Reactions  . Tomato Rash    Social History   Social History  . Marital status: Single    Spouse name: N/A  . Number of children: N/A  . Years of education: N/A   Occupational History  . Not on file.   Social History Main Topics  . Smoking status: Current Some Day Smoker    Years: 10.00    Types: Cigarettes  . Smokeless tobacco: Never Used     Comment: smokes 7 cigarettes monthly 10-17-16  . Alcohol use Yes     Comment: 12 beers a month per pt  . Drug use: No  . Sexual activity: Not Currently   Other Topics Concern  . Not on file   Social History Narrative  . No narrative on file     Family History  Problem Relation Age of Onset  . Diabetes Mother   . Brain cancer Mother   . Heart attack Mother   . Sleep apnea Mother   . Heart failure Mother   . Hypertension Father   . Diabetes Father   . Heart disease Father   . Hyperlipidemia Father   . Heart attack Father   . Heart failure Father   . Sleep apnea Father   . Sleep apnea Sister   . Sleep apnea Brother   . Heart failure Maternal Aunt      ROS:  Please see the history of present illness.     All other systems reviewed and  negative.    Physical Exam:  Blood pressure 114/84, pulse 81, height 6' (1.829 m), weight (!) 321 lb (145.6 kg). General: Well developed, Morbidly obese  male in no acute distress. Head: Normocephalic, atraumatic, sclera non-icteric, no xanthomas, nares are without discharge. EENT: normal  Lymph Nodes:  none Neck: Negative for carotid bruits. JVD not elevated. Back:without scoliosis kyphosis Lungs: Clear bilaterally to auscultation without wheezes, rales, or rhonchi. Breathing is unlabored. Heart: RRR with S1 S2. No  murmur . No  rubs, or gallops appreciated. Abdomen: Soft, non-tender, non-distended with normoactive bowel sounds. No hepatomegaly. No rebound/guarding. No obvious abdominal masses. Msk:  Strength and tone appear normal for age. Extremities: No clubbing or cyanosis. No edema.  Distal pedal pulses are 2+ and equal bilaterally. Skin: Warm and Dry Neuro: Alert and oriented X 3. CN III-XII intact Grossly normal sensory and motor function . Walks with a cane Psych:  Responds to questions appropriately with a normal affect.      Labs: Cardiac Enzymes No results for input(s): CKTOTAL, CKMB, TROPONINI in the last 72 hours. CBC Lab Results  Component Value Date   WBC 6.4 09/08/2016   HGB 14.3 09/08/2016   HCT 42.8 09/08/2016   MCV 98.5 09/08/2016   PLT 243 09/08/2016   PROTIME: No results for input(s): LABPROT, INR in the last 72 hours. Chemistry No results for input(s): NA, K, CL, CO2, BUN, CREATININE, CALCIUM, PROT, BILITOT, ALKPHOS, ALT, AST, GLUCOSE in the last 168 hours.  Invalid input(s): LABALBU Lipids Lab Results  Component Value Date   CHOL 127 02/12/2016   HDL 28 (L) 02/12/2016   LDLCALC 77 02/12/2016   TRIG 108 02/12/2016   BNP No results found for: PROBNP Thyroid Function Tests: No results for input(s): TSH, T4TOTAL, T3FREE, THYROIDAB in the last 72 hours.  Invalid input(s): FREET3 Miscellaneous No results found for: DDIMER  Radiology/Studies:    No results found.  EKG:  Sinus rhythm at 81 Intervals 16/09/42 Nonspecific T-wave inversion   Assessment and Plan:   NICM  Congestive heart failure-chronic-systolic  Sleep apnea-treated  Stroke-2  Morbid obesity  The patient is well treated with guidelines directed therapy. I am not aware of interval recommendations as to the role of hydralazine nitrates in the context of Entresto therapy. I will be to explore this. He is otherwise euvolemic and without symptoms of lightheadedness.  He still has class II heart failure symptoms. Hence, with his persistent LV dysfunction and his young age, he is a candidate for ICD implantation for primary prevention based on both recommendations as well as the [place] Trial   Have reviewed the potential benefits and risks of ICD implantation including but not limited to death, perforation of heart or lung, lead dislodgement, infection,  device malfunction and inappropriate shocks.  The patientexpress understanding  and are willing to proceed.     We will use a device that will also allow [place] to monitor for atrial fibrillation; he is currently taking antiplatelet therapy but if he were to have atrial fibrillation as suggested by his prior history of mitral regurgitation and left atrial enlargement and anticoagulation would be indicated. I don't think we need to use a dual-chamber device.    Virl Axe

## 2016-12-08 NOTE — Discharge Instructions (Signed)
° ° °  Supplemental Discharge Instructions for  Pacemaker/Defibrillator Patients  Activity No heavy lifting or vigorous activity with your left/right arm for 6 to 8 weeks.  Do not raise your left/right arm above your head for one week.  Gradually raise your affected arm as drawn below.           __         12/12/16                       12/13/16                         12/14/16                  12/15/16  NO DRIVING for 1 week    ; you may begin driving on  9/84/21   .  WOUND CARE - Keep the wound area clean and dry.  - No bandage is needed on the site.  DO  NOT apply any creams, oils, or ointments to the wound area. - If you notice any drainage or discharge from the wound, any swelling or bruising at the site, or you develop a fever > 101? F after you are discharged home, call the office at once.  Special Instructions - You are still able to use cellular telephones; use the ear opposite the side where you have your pacemaker/defibrillator.  Avoid carrying your cellular phone near your device. - When traveling through airports, show security personnel your identification card to avoid being screened in the metal detectors.  Ask the security personnel to use the hand wand. - Avoid arc welding equipment, TENS units (transcutaneous nerve stimulators).  Call the office for questions about other devices. - Avoid electrical appliances that are in poor condition or are not properly grounded. - Microwave ovens are safe to be near or to operate.  Additional information for defibrillator patients should your device go off: - If your device goes off ONCE and you feel fine afterward, notify the device clinic nurses. - If your device goes off ONCE and you do not feel well afterward, call 911. - If your device goes off TWICE, call 911. - If your device goes off THREE times in one day, call 911.  DO NOT DRIVE YOURSELF OR A FAMILY MEMBER WITH A DEFIBRILLATOR TO THE HOSPITAL--CALL 911.

## 2016-12-08 NOTE — Discharge Summary (Signed)
ELECTROPHYSIOLOGY PROCEDURE DISCHARGE SUMMARY    Patient ID: Kent Mcniel.,  MRN: [id number], DOB/AGE: 11/17/1970 46 y.o.  Admit date: 12/08/2016 Discharge date: 12/09/2016  Primary Care Physician: Olin Hauser, DO Primary Cardiologist: Fletcher [name] Electrophysiologist: [name]  Primary Discharge Diagnosis:  NICM s/p ICD implant this admission  Secondary Discharge Diagnosis:  1.  Chronic systolic heart failure 2.  OSA 3.  Morbid obesity 4.  Prior CVA 5.  HTN 6.  Diabetes  Allergies  Allergen Reactions  . Tomato Rash     Procedures This Admission:  1.  Implantation of a MDT single chamber ICD on 12/08/16 by Dr [name].  The patient received a MDT model number Visia AF ICD with model number 0293 [place] right ventricular lead.  DFT's were deferred at time of implant.  There were no immediate post procedure complications. 2.  CXR on 12/09/16 demonstrated no pneumothorax status post device implantation.   Brief HPI: Kent Arnall. is a 46 y.o. male was referred to electrophysiology in the outpatient setting for consideration of ICD implantation.  Past medical history includes NICM, hypertension, diabetes, and prior stroke.  The patient has persistent LV dysfunction despite guideline directed therapy.  Risks, benefits, and alternatives to ICD implantation were reviewed with the patient who wished to proceed.   Hospital Course:  The patient was admitted and underwent implantation of a MDT single chamber ICD with details as outlined above. He was monitored on telemetry overnight which demonstrated sinus rhythm.  Left chest was without hematoma or ecchymosis.  The device was interrogated and found to be functioning normally.  CXR was obtained and demonstrated no pneumothorax status post device implantation.  Wound care, arm mobility, and restrictions were reviewed with the patient.  The patient was examined and considered stable for discharge to home.   The  patient's discharge medications include an ACE-I (Entresto) and beta blocker (Toprol).   Physical Exam: Vitals:   12/08/16 1136 12/08/16 1219 12/08/16 2112 12/09/16 0522  BP:   106/71 115/80  Pulse: (!) 0 80 84 82  Resp: (!) 47  18 19  Temp:  97.6 F (36.4 C) 98.4 F (36.9 C) 97.3 F (36.3 C)  TempSrc:  Oral Oral Oral  SpO2: (!) 0% 95% 97% 95%  Weight:  (!) 303 lb 8 oz (137.7 kg)  (!) 318 lb 9.6 oz (144.5 kg)  Height:  6' (1.829 m)      Labs:   Lab Results  Component Value Date   WBC 7.4 12/02/2016   HGB 14.3 09/08/2016   HCT 42.8 12/02/2016   MCV 93 12/02/2016   PLT 221 12/02/2016     Recent Labs Lab 12/02/16 0925  NA 141  K 4.4  CL 105  CO2 22  BUN 23  CREATININE 1.61*  CALCIUM 9.0  GLUCOSE 131*    Discharge Medications:  Allergies as of 12/09/2016      Reactions   Tomato Rash      Medication List    TAKE these medications   blood glucose meter kit and supplies Kit Dispense based on patient and insurance preference. Check blood glucose once daily.   clopidogrel 75 MG tablet Commonly known as:  PLAVIX Take 1 tablet (75 mg total) by mouth every morning.   ENTRESTO 49-51 MG Generic drug:  sacubitril-valsartan take 1 tablet by mouth twice a day   furosemide 40 MG tablet Commonly known as:  LASIX Take 1 pill '40mg'$  daily. Take additional '20mg'$  (1/2  tablet) every other PM What changed:  how much to take  how to take this  when to take this  additional instructions   glipiZIDE 10 MG tablet Commonly known as:  GLUCOTROL Take 1 tablet (10 mg total) by mouth daily before breakfast.   ibuprofen 200 MG tablet Commonly known as:  ADVIL,MOTRIN Take 400-800 mg by mouth daily as needed for moderate pain.   meclizine 25 MG tablet Commonly known as:  ANTIVERT take 1 tablet by mouth three times a day if needed for dizziness or nausea   metoprolol succinate 25 MG 24 hr tablet Commonly known as:  TOPROL-XL Take 1 tablet (25 mg total) by mouth every  morning.   MUSCLE RUB 10-15 % Crea Apply 1 application topically daily.   pravastatin 80 MG tablet Commonly known as:  PRAVACHOL take 1 tablet by mouth once daily AT 6 PM   sildenafil 20 MG tablet Commonly known as:  REVATIO Take 1-5 pills about 30 min prior to sex. Start with 1 and increase as needed. Caution stop taking if chest pain. Do not take with Nitro.   spironolactone 25 MG tablet Commonly known as:  ALDACTONE Take 0.5 tablets (12.5 mg total) by mouth daily.       Disposition:  Discharge Instructions    Diet - low sodium heart healthy    Complete by:  As directed    Increase activity slowly    Complete by:  As directed      Follow-up Information    [place] [facility] Follow up on 12/22/2016.   Specialty:  Cardiology Why:  at [facility] for wound check  Contact information: [address], [address] [place]       Deboraha Sprang, MD Follow up on 03/10/2017.   Specialty:  Cardiology Why:  at 11:15AM Contact information: [place] [address] [phone]           Duration of Discharge Encounter: Greater than 30 minutes including physician time.  Signed, Chanetta Marshall, NP 12/09/2016 9:04 AM

## 2016-12-09 ENCOUNTER — Other Ambulatory Visit: Payer: Self-pay

## 2016-12-09 ENCOUNTER — Encounter (HOSPITAL_COMMUNITY): Payer: Self-pay | Admitting: *Deleted

## 2016-12-09 ENCOUNTER — Ambulatory Visit (HOSPITAL_COMMUNITY): Payer: Medicaid Other

## 2016-12-09 DIAGNOSIS — E1122 Type 2 diabetes mellitus with diabetic chronic kidney disease: Secondary | ICD-10-CM | POA: Diagnosis not present

## 2016-12-09 DIAGNOSIS — I130 Hypertensive heart and chronic kidney disease with heart failure and stage 1 through stage 4 chronic kidney disease, or unspecified chronic kidney disease: Secondary | ICD-10-CM | POA: Diagnosis not present

## 2016-12-09 DIAGNOSIS — I428 Other cardiomyopathies: Secondary | ICD-10-CM

## 2016-12-09 DIAGNOSIS — I2510 Atherosclerotic heart disease of native coronary artery without angina pectoris: Secondary | ICD-10-CM | POA: Diagnosis not present

## 2016-12-09 LAB — GLUCOSE, CAPILLARY: GLUCOSE-CAPILLARY: 102 mg/dL — AB (ref 65–99)

## 2016-12-09 NOTE — Progress Notes (Signed)
Discharge to home, patient refused to discussed discharge instructions  Verbalized "I know how to read".  Discharged ambulatory, friend accompanying patient.

## 2016-12-10 ENCOUNTER — Telehealth: Payer: Self-pay | Admitting: Cardiology

## 2016-12-10 NOTE — Telephone Encounter (Signed)
Patient called and stated that he can not come to wound check appointment in [place]. He needs a wound check in [place]. After consulting with Device Clinic RN and reviewing the schedule Device Clinic RN is not comfortable scheduling patient at 8 days post op without MD approval. Can you confirm if it is ok to add patient on to MD schedule in [place] on 12-16-2016? Pt is aware that we are reviewing this with MD and MD nurse we will call back when we have an appt date and time for patient.

## 2016-12-11 NOTE — Telephone Encounter (Signed)
Per Dr. Graciela Husbands- ok to add to [facility] schedule on 12/16/16 for a wound check appointment- if her schedule will not allow for this, then ok to add on Dr. Odessa Fleming schedule at 8:45 am.  Will forward to Orie Rout to help with scheduling in [place].

## 2016-12-12 NOTE — Telephone Encounter (Signed)
Called Kent Stanley at Device clinic in [place]. Patient can come in Tuesday at 2 pm for wound check .  Patient confirms this is ok .

## 2016-12-16 ENCOUNTER — Ambulatory Visit (INDEPENDENT_AMBULATORY_CARE_PROVIDER_SITE_OTHER): Payer: Self-pay | Admitting: *Deleted

## 2016-12-16 DIAGNOSIS — I428 Other cardiomyopathies: Secondary | ICD-10-CM

## 2016-12-16 DIAGNOSIS — Z95810 Presence of automatic (implantable) cardiac defibrillator: Secondary | ICD-10-CM

## 2016-12-16 LAB — CUP PACEART INCLINIC DEVICE CHECK
Battery Remaining Longevity: 138 mo
Battery Voltage: 3.15 V
Brady Statistic RV Percent Paced: 0.01 %
HighPow Impedance: 62 Ohm
Implantable Lead Location: 753860
Implantable Lead Model: 293
Lead Channel Setting Pacing Amplitude: 3.5 V
Lead Channel Setting Pacing Pulse Width: 0.4 ms
Lead Channel Setting Sensing Sensitivity: 0.3 mV
MDC IDC LEAD IMPLANT DT: 20180604
MDC IDC LEAD SERIAL: 433424
MDC IDC MSMT LEADCHNL RV IMPEDANCE VALUE: 361 Ohm
MDC IDC MSMT LEADCHNL RV IMPEDANCE VALUE: 361 Ohm
MDC IDC MSMT LEADCHNL RV PACING THRESHOLD AMPLITUDE: 1.75 V
MDC IDC MSMT LEADCHNL RV PACING THRESHOLD PULSEWIDTH: 0.4 ms
MDC IDC MSMT LEADCHNL RV SENSING INTR AMPL: 18.125 mV
MDC IDC PG IMPLANT DT: 20180604
MDC IDC SESS DTM: 20180612145655

## 2016-12-16 NOTE — Progress Notes (Signed)
Wound check appointment, 8 days post-implant due to transportation/scheduling issues (unable to travel to [facility]). Dermabond partially removed (<0.5cm area) from left lateral incision, exposed incision edges superficially unapproximated. Applied antibiotic ointment and bandaid to superficially unapproximated area, remaining Dermabond left in place. Wound without redness or edema. Incision edges appear otherwise well approximated and healing well beneath Dermabond. Plan to return in 2 weeks for wound recheck, patient aware to call in the interim if he notes any signs/symptoms of infection.   Normal device function. Sensing and impedances consistent with implant measurements. RV threshold elevated at 1.75V @ 0.72ms, also 1.75V @ 0.60ms (Vp <0.1%), device programmed at 3.5V @ 0.58ms (adaptive) for extra safety margin, no changes today. Histogram distribution appropriate for patient and level of activity. No AF episodes noted. 1 monitored NSVT episode, 5bts duration. Patient educated about wound care, arm mobility, lifting restrictions, shock plan. ROV with [employer] on 12/30/16 for wound recheck and ROV with SK/B  in 3 months.

## 2016-12-16 NOTE — Patient Instructions (Signed)
Please call the Device Clinic at [phone] if you note any signs/symptoms of infection, including redness, drainage, swelling, pain, fever, or chills.

## 2016-12-22 ENCOUNTER — Ambulatory Visit: Payer: Self-pay

## 2016-12-30 ENCOUNTER — Ambulatory Visit (INDEPENDENT_AMBULATORY_CARE_PROVIDER_SITE_OTHER): Payer: Self-pay | Admitting: *Deleted

## 2016-12-30 ENCOUNTER — Other Ambulatory Visit: Payer: Self-pay

## 2016-12-30 DIAGNOSIS — N5201 Erectile dysfunction due to arterial insufficiency: Secondary | ICD-10-CM

## 2016-12-30 DIAGNOSIS — I428 Other cardiomyopathies: Secondary | ICD-10-CM

## 2016-12-30 DIAGNOSIS — Z95810 Presence of automatic (implantable) cardiac defibrillator: Secondary | ICD-10-CM

## 2016-12-30 LAB — CUP PACEART INCLINIC DEVICE CHECK
Battery Remaining Longevity: 138 mo
Battery Voltage: 3.17 V
Brady Statistic RV Percent Paced: 0.02 %
HighPow Impedance: 64 Ohm
Implantable Lead Location: 753860
Implantable Lead Model: 293
Lead Channel Impedance Value: 342 Ohm
Lead Channel Sensing Intrinsic Amplitude: 24 mV
Lead Channel Setting Pacing Amplitude: 3.5 V
Lead Channel Setting Pacing Pulse Width: 0.4 ms
Lead Channel Setting Sensing Sensitivity: 0.3 mV
MDC IDC LEAD IMPLANT DT: 20180604
MDC IDC LEAD SERIAL: 433424
MDC IDC MSMT LEADCHNL RV IMPEDANCE VALUE: 342 Ohm
MDC IDC MSMT LEADCHNL RV PACING THRESHOLD AMPLITUDE: 1 V
MDC IDC MSMT LEADCHNL RV PACING THRESHOLD PULSEWIDTH: 0.4 ms
MDC IDC PG IMPLANT DT: 20180604
MDC IDC SESS DTM: 20180626145104

## 2016-12-30 MED ORDER — SILDENAFIL CITRATE 20 MG PO TABS: ORAL_TABLET | ORAL | 5 refills | Status: DC

## 2016-12-30 NOTE — Progress Notes (Signed)
Wound recheck appointment. Dermabond removed. Wound without redness or edema. Incision edges approximated, wound well healed. Normal device function. Thresholds, sensing, and impedances consistent with implant measurements. Device programmed at 3.5V for extra safety margin until 3 month visit. Histogram distribution appropriate for patient and level of activity. No AF or ventricular arrhythmias noted. Patient educated about wound care, arm mobility, lifting restrictions, shock plan. ROV with SK/B on 03/10/17.

## 2016-12-30 NOTE — Patient Instructions (Addendum)
Restrictions until 01/19/17 (6 weeks post-ICD placement):  - Limit lifting/pushing/pulling with left arm to 10lbs.  After 01/19/17, increase weight slowly.  - No range of motion restrictions.

## 2017-02-09 ENCOUNTER — Other Ambulatory Visit: Payer: Self-pay | Admitting: Family Medicine

## 2017-02-09 DIAGNOSIS — I5022 Chronic systolic (congestive) heart failure: Secondary | ICD-10-CM

## 2017-02-09 DIAGNOSIS — I10 Essential (primary) hypertension: Secondary | ICD-10-CM

## 2017-02-18 ENCOUNTER — Ambulatory Visit: Payer: Medicaid Other | Attending: Family | Admitting: Family

## 2017-02-18 ENCOUNTER — Encounter: Payer: Self-pay | Admitting: Family

## 2017-02-18 VITALS — BP 106/59 | HR 54 | Resp 18 | Ht 72.0 in | Wt 307.0 lb

## 2017-02-18 DIAGNOSIS — I25119 Atherosclerotic heart disease of native coronary artery with unspecified angina pectoris: Secondary | ICD-10-CM | POA: Diagnosis not present

## 2017-02-18 DIAGNOSIS — I130 Hypertensive heart and chronic kidney disease with heart failure and stage 1 through stage 4 chronic kidney disease, or unspecified chronic kidney disease: Secondary | ICD-10-CM | POA: Diagnosis not present

## 2017-02-18 DIAGNOSIS — E785 Hyperlipidemia, unspecified: Secondary | ICD-10-CM | POA: Diagnosis not present

## 2017-02-18 DIAGNOSIS — N182 Chronic kidney disease, stage 2 (mild): Secondary | ICD-10-CM | POA: Diagnosis present

## 2017-02-18 DIAGNOSIS — E1122 Type 2 diabetes mellitus with diabetic chronic kidney disease: Secondary | ICD-10-CM | POA: Diagnosis not present

## 2017-02-18 DIAGNOSIS — G4733 Obstructive sleep apnea (adult) (pediatric): Secondary | ICD-10-CM | POA: Insufficient documentation

## 2017-02-18 DIAGNOSIS — Z7984 Long term (current) use of oral hypoglycemic drugs: Secondary | ICD-10-CM | POA: Insufficient documentation

## 2017-02-18 DIAGNOSIS — Z8673 Personal history of transient ischemic attack (TIA), and cerebral infarction without residual deficits: Secondary | ICD-10-CM | POA: Insufficient documentation

## 2017-02-18 DIAGNOSIS — M25512 Pain in left shoulder: Secondary | ICD-10-CM | POA: Diagnosis not present

## 2017-02-18 DIAGNOSIS — G40909 Epilepsy, unspecified, not intractable, without status epilepticus: Secondary | ICD-10-CM | POA: Insufficient documentation

## 2017-02-18 DIAGNOSIS — I429 Cardiomyopathy, unspecified: Secondary | ICD-10-CM | POA: Diagnosis not present

## 2017-02-18 DIAGNOSIS — Z87891 Personal history of nicotine dependence: Secondary | ICD-10-CM | POA: Insufficient documentation

## 2017-02-18 DIAGNOSIS — Z95810 Presence of automatic (implantable) cardiac defibrillator: Secondary | ICD-10-CM | POA: Diagnosis not present

## 2017-02-18 DIAGNOSIS — I5022 Chronic systolic (congestive) heart failure: Secondary | ICD-10-CM | POA: Diagnosis present

## 2017-02-18 DIAGNOSIS — E118 Type 2 diabetes mellitus with unspecified complications: Secondary | ICD-10-CM

## 2017-02-18 DIAGNOSIS — I10 Essential (primary) hypertension: Secondary | ICD-10-CM

## 2017-02-18 NOTE — Progress Notes (Signed)
Patient ID: Kent Kindred., male    DOB: August 20, 1970, 46 y.o.   MRN: [id number]  HPI  Kent Stanley is a 46 y/o male with a history of recent stroke (Feb 2017), CKD stage 2, CAD, hyperlipidemia, HTN, morbid obesity, polysubstance abuse in the past, prior tobacco use, seizures, DM, ICD implantation and chronic heart failure.  Last echo done 10/24/16 which showed an EF of 15-20%. EF has declined from previous echo which was done 03/28/16 which showed an EF of 20-25% without regurgitation. Last catheterization was done March 2016.  Admitted 12/08/16 for same day for ICD implantation. Was in the ED 09/08/16 with acute left shoulder pain. Treated and released the same day. Admitted on 03/27/16 with heart failure exacerbation. Was treated with IV diuretics and spironolactone was added. Discussion about possible ICD in the future if the EF remains low. Discharged the next day.  He presents today for his follow-up visit with a chief complaint of light-headedness. He describes it as intermittent but has been present for many months. Doesn't feel like it's occurring anymore frequently than previously. Has associated infrequent, chronic angina with this.   Past Medical History:  Diagnosis Date  . Cerebellar Stroke    a. 08/2015 MRI: Acute/subacute L superior cerebellar infarct. Remote R cerebellar infarct;  b. 08/2015 Carotid U/S: normal carotids.  . Chronic systolic heart failure ([place])    a. 10/2012 Echo: EF <20%, mod to sev dil LV, diast dysfxn, mildly dil LA/RA, mod to severe Kent/TR.  . CKD (chronic kidney disease), stage II   . Coronary artery disease, non-occlusive    a. 09/2014 Cath: minor luminal irregularities with severely dilated left ventricle. LVEDP 30.  [name] H/O noncompliance with medical treatment, presenting hazards to health   . History of traumatic injury of head   . Hyperlipidemia   . Hypertension   . Morbid obesity ([place])   . NICM (nonischemic cardiomyopathy) ([place])    a. 10/2012 Echo: EF <20%.   . Polysubstance abuse    a. cocaine, tobacco, THC, ETOH  . Seizure disorder ([place])   . Stroke ([place])   . Type II diabetes mellitus ([place])    Past Surgical History:  Procedure Laterality Date  . CARDIAC CATHETERIZATION     [facility]  . ICD IMPLANT N/A 12/08/2016   Procedure: ICD Implant;  Surgeon: Deboraha Sprang, MD;  Location: [place] [facility];  Service: Cardiovascular;  Laterality: N/A;   Family History  Problem Relation Age of Onset  . Diabetes Mother   . Brain cancer Mother   . Heart attack Mother   . Sleep apnea Mother   . Heart failure Mother   . Hypertension Father   . Diabetes Father   . Heart disease Father   . Hyperlipidemia Father   . Heart attack Father   . Heart failure Father   . Sleep apnea Father   . Sleep apnea Sister   . Sleep apnea Brother   . Heart failure Maternal Aunt    Social History  Substance Use Topics  . Smoking status: Former Smoker    Years: 10.00    Types: Cigarettes    Quit date: 12/19/2016  . Smokeless tobacco: Never Used     Comment: smokes 7 cigarettes monthly 10-17-16  . Alcohol use Yes     Comment: 12 beers a month per pt   Allergies  Allergen Reactions  . Tomato Rash   Prior to Admission medications   Medication Sig Start Date End Date  Taking? Authorizing Provider  albuterol (PROVENTIL HFA;VENTOLIN HFA) 108 (90 Base) MCG/ACT inhaler Inhale 2 puffs into the lungs every 6 (six) hours as needed for wheezing or shortness of breath. 03/28/16  Yes Amy Overton Mam, NP  aspirin EC 81 MG tablet Take 1 tablet (81 mg total) by mouth daily. 06/10/16  Yes Alexander Devin Going, DO  blood glucose meter kit and supplies KIT Dispense based on patient and insurance preference. Check blood glucose once daily. 08/21/15  Yes Amy Overton Mam, NP  clopidogrel (PLAVIX) 75 MG tablet Take 1 tablet (75 mg total) by mouth daily. Patient taking differently: Take 75 mg by mouth every morning.  02/12/16  Yes Amy Overton Mam, NP  furosemide (LASIX) 40 MG tablet  Take 1 tablet (40 mg total) by mouth 2 (two) times daily. 08/20/16  Yes Alisa Graff, FNP  glipiZIDE (GLUCOTROL) 10 MG tablet Take 1 tablet (10 mg total) by mouth daily before breakfast. 06/10/16  Yes Olin Hauser, DO  meclizine (ANTIVERT) 25 MG tablet Take 1 tablet (25 mg total) by mouth 3 (three) times daily as needed for dizziness or nausea. 08/20/16  Yes Alisa Graff, FNP  metoprolol succinate (TOPROL-XL) 25 MG 24 hr tablet Take 25 mg by mouth every morning.    Yes Historical Provider, MD  nitroGLYCERIN (NITROSTAT) 0.4 MG SL tablet Place 1 tablet (0.4 mg total) under the tongue every 5 (five) minutes as needed for chest pain. 03/28/16  Yes Loletha Grayer, MD  pravastatin (PRAVACHOL) 80 MG tablet Take 1 tablet (80 mg total) by mouth daily at 6 PM. 09/12/15  Yes Amy Overton Mam, NP  sacubitril-valsartan (ENTRESTO) 49-51 MG Take 1 tablet by mouth 2 (two) times daily. 05/19/16  Yes Alisa Graff, FNP  sildenafil (REVATIO) 20 MG tablet Take 1-5 pills about 30 min prior to sex. Start with 1 and increase as needed. Caution stop taking if chest pain. Do not take with Nitro. 07/30/16  Yes Alexander Devin Going, DO  spironolactone (ALDACTONE) 25 MG tablet Take 0.5 tablets (12.5 mg total) by mouth daily. 05/27/16  Yes Wellington Hampshire, MD   Review of Systems  Constitutional: Negative for appetite change and fatigue.  HENT: Negative for congestion, postnasal drip and sore throat.   Eyes: Negative.   Respiratory: Negative for chest tightness, shortness of breath and wheezing.   Cardiovascular: Positive for chest pain (intermittent). Negative for palpitations and leg swelling.  Gastrointestinal: Negative for abdominal distention and abdominal pain.  Endocrine: Negative.   Genitourinary: Negative.   Musculoskeletal: Negative for back pain and neck pain.  Skin: Negative.   Allergic/Immunologic: Negative.   Neurological: Positive for light-headedness. Negative for dizziness.  Hematological:  Negative for adenopathy. Does not bruise/bleed easily.  Psychiatric/Behavioral: Negative for dysphoric mood and sleep disturbance (sleeping with CPAP about 5-6 hours a night). The patient is not nervous/anxious.    Vitals:   02/18/17 0955  BP: (!) 106/59  Pulse: (!) 54  Resp: 18  SpO2: 98%  Weight: (!) 307 lb (139.3 kg)  Height: 6' (1.829 m)   Wt Readings from Last 3 Encounters:  02/18/17 (!) 307 lb (139.3 kg)  12/09/16 (!) 318 lb 9.6 oz (144.5 kg)  11/18/16 (!) 324 lb 8 oz (147.2 kg)    Lab Results  Component Value Date   CREATININE 1.61 (H) 12/02/2016   CREATININE 1.88 (H) 09/08/2016   CREATININE 1.44 (H) 05/19/2016    Physical Exam  Constitutional: He is oriented to person, place, and time. He  appears well-developed and well-nourished.  HENT:  Head: Normocephalic and atraumatic.  Neck: Normal range of motion. Neck supple. No JVD present.  Cardiovascular: An irregular rhythm present. Bradycardia present.   Pulmonary/Chest: Effort normal. He has no wheezes. He has no rales.  Abdominal: Soft. He exhibits no distension. There is no tenderness.  Musculoskeletal: He exhibits no edema or tenderness.  Neurological: He is alert and oriented to person, place, and time.  Skin: Skin is warm and dry.  Psychiatric: He has a normal mood and affect. His behavior is normal. Thought content normal.  Nursing note and vitals reviewed.   Assessment & Plan:  1: Chronic heart failure with reduced ejection fraction- - NYHA Class I - euvolemic today - home weight down to 302 pounds. To call for an overnight weight gain of >2 pounds or a weekly weight gain of >5 pounds. By our scale, he's lost 17 pounds since he was last here - Using Mrs. Dash, does use regular salt on popcorn (infrequent).  - now riding stationary bike for 60 minutes 5 days a week - taking furosemide 26m AM and 2108mevery other PM.  - Saw cardiologist (AFletcher [name]3/20/18 - saw EP (KCaryl Comes5/8/18 - consider titrating up  entresto in the future - had AICD placed and incision has healed well from that - does not meet ReDS criteria due to BMI  2: HTN- - BP looks good today  3: Obstructive sleep apnea- - wearing CPAP nightly  - continues to sleep well  4: Diabetes- - glucose has been running in the 200's  Medication bottles were reviewed.  Patient will be losing his medicaid as of 04/06/17. Discussed having an appointment with [facility] already scheduled for after that time and that we could help supply samples until they get the paperwork completed. Information given regarding charity care as well during this time of being uninsured.   Return in 2 months or sooner for any questions/problems before then.

## 2017-02-18 NOTE — Patient Instructions (Addendum)
Continue weighing daily and call for an overnight weight gain of > 2 pounds or a weekly weight gain of >5 pounds.  Contact Medication Management to make an appointment for October when insurance runs out. If for any reason you can not be seen by Medication Management contact our office so we can pursue another route.  I have given you an application for medication management fill it out and return it to them. I have also included an application for [employer] that will help cover medical expenses if you qualify during your period of being uninsured.   If you have any questions or require assistance please contact the office.

## 2017-03-04 ENCOUNTER — Ambulatory Visit: Payer: Medicaid Other | Admitting: Pharmacy Technician

## 2017-03-04 ENCOUNTER — Other Ambulatory Visit: Payer: Self-pay | Admitting: Family Medicine

## 2017-03-04 DIAGNOSIS — Z79899 Other long term (current) drug therapy: Secondary | ICD-10-CM

## 2017-03-04 DIAGNOSIS — Z8673 Personal history of transient ischemic attack (TIA), and cerebral infarction without residual deficits: Secondary | ICD-10-CM

## 2017-03-04 NOTE — Progress Notes (Signed)
Completed [facility] application and contract.  Patient agreed to all terms of the [facility] contract.  Patient's Medicaid to expire 04/05/17.  [facility] will provide medication assistance once Medicaid is terminated.  Patient to provide letter stating that Medicaid has ended.   Provided patient with Community education officer based on his particular needs.    Entresto Prescription Application completed with patient.  Will forward to [facility] for signature once Medicaid has ended.  Upon receipt of signed application from provider and proof that Medicaid has ended, [facility] Prescription Application will be submitted to [employer].  Sherilyn Dacosta Care Manager [facility]

## 2017-03-10 ENCOUNTER — Ambulatory Visit (INDEPENDENT_AMBULATORY_CARE_PROVIDER_SITE_OTHER): Payer: Medicaid Other | Admitting: Internal Medicine

## 2017-03-10 ENCOUNTER — Encounter: Payer: Self-pay | Admitting: Internal Medicine

## 2017-03-10 VITALS — BP 100/70 | HR 78 | Ht 72.0 in | Wt 316.2 lb

## 2017-03-10 DIAGNOSIS — Z95810 Presence of automatic (implantable) cardiac defibrillator: Secondary | ICD-10-CM

## 2017-03-10 DIAGNOSIS — I428 Other cardiomyopathies: Secondary | ICD-10-CM

## 2017-03-10 DIAGNOSIS — I5022 Chronic systolic (congestive) heart failure: Secondary | ICD-10-CM

## 2017-03-10 LAB — CUP PACEART INCLINIC DEVICE CHECK
Battery Remaining Longevity: 137 mo
Battery Voltage: 3.14 V
HighPow Impedance: 77 Ohm
Implantable Lead Implant Date: 20180604
Implantable Lead Location: 753860
Implantable Pulse Generator Implant Date: 20180604
Lead Channel Pacing Threshold Amplitude: 1.5 V
Lead Channel Pacing Threshold Pulse Width: 0.4 ms
Lead Channel Setting Pacing Pulse Width: 0.4 ms
Lead Channel Setting Sensing Sensitivity: 0.3 mV
MDC IDC LEAD SERIAL: 433424
MDC IDC MSMT LEADCHNL RV IMPEDANCE VALUE: 361 Ohm
MDC IDC MSMT LEADCHNL RV IMPEDANCE VALUE: 361 Ohm
MDC IDC MSMT LEADCHNL RV SENSING INTR AMPL: 13.5 mV
MDC IDC SESS DTM: 20180904135607
MDC IDC SET LEADCHNL RV PACING AMPLITUDE: 2.75 V
MDC IDC STAT BRADY RV PERCENT PACED: 0.02 %

## 2017-03-10 NOTE — Patient Instructions (Signed)
Medication Instructions: - Your physician recommends that you continue on your current medications as directed. Please refer to the Current Medication list given to you today.  Labwork: - none ordered  Procedures/Testing: - none ordered  Follow-Up: - Your physician recommends that you schedule a follow-up appointment in: November 2018 with Dr. Kirke Corin.   - Remote monitoring is used to monitor your Pacemaker of ICD from home. This monitoring reduces the number of office visits required to check your device to one time per year. It allows [place] to keep an eye on the functioning of your device to ensure it is working properly. You are scheduled for a device check from home on 06/09/17. You may send your transmission at any time that day. If you have a wireless device, the transmission will be sent automatically. After your physician reviews your transmission, you will receive a postcard with your next transmission date.  - Your physician wants you to follow-up in: 9 months with Dr. Graciela Husbands. You will receive a reminder letter in the mail two months in advance. If you don't receive a letter, please call our office to schedule the follow-up appointment.   Any Additional Special Instructions Will Be Listed Below (If Applicable).     If you need a refill on your cardiac medications before your next appointment, please call your pharmacy.

## 2017-03-10 NOTE — Progress Notes (Signed)
Patient Care Team: Olin Hauser, DO as PCP - General (Family Medicine) Alisa Graff, FNP as Nurse Practitioner (Family Medicine) Wellington Hampshire, MD as Consulting Physician (Cardiology) Laverle Hobby, MD as Consulting Physician (Pulmonary Disease)   HPI  Kent Stanley. is a 46 y.o. male Seen in followup for ICD implanted 5/18 for primary prevention with NICM Hx of prior strokes    DATE TEST    4/14*  Echo   EF 20 %   2016 Cath  No obstructive CAD  9/17  Echo   EF 20-25 %   4/18 Echo  EF 15-20% Mild LAE    Date Cr K Mg  5/18  1.61 4.4           No chest pain or SOB  No Edema   Working at [employer] to build up [employer].   He continues to exercise and has been successful in losing some weight  *  Past Medical History:  Diagnosis Date  . Cerebellar Stroke    a. 08/2015 MRI: Acute/subacute L superior cerebellar infarct. Remote R cerebellar infarct;  b. 08/2015 Carotid U/S: normal carotids.  . Chronic systolic heart failure ([place])    a. 10/2012 Echo: EF <20%, mod to sev dil LV, diast dysfxn, mildly dil LA/RA, mod to severe MR/TR.  . CKD (chronic kidney disease), stage II   . Coronary artery disease, non-occlusive    a. 09/2014 Cath: minor luminal irregularities with severely dilated left ventricle. LVEDP 30.  [name] H/O noncompliance with medical treatment, presenting hazards to health   . History of traumatic injury of head   . Hyperlipidemia   . Hypertension   . Morbid obesity ([place])   . NICM (nonischemic cardiomyopathy) ([place])    a. 10/2012 Echo: EF <20%.  . Polysubstance abuse    a. cocaine, tobacco, THC, ETOH  . Seizure disorder ([place])   . Stroke ([place])   . Type II diabetes mellitus ([place])     Past Surgical History:  Procedure Laterality Date  . CARDIAC CATHETERIZATION     [facility]  . ICD IMPLANT N/A 12/08/2016   Procedure: ICD Implant;  Surgeon: Deboraha Sprang, MD;  Location: [place] [facility];  Service: Cardiovascular;  Laterality:  N/A;    Current Outpatient Prescriptions  Medication Sig Dispense Refill  . blood glucose meter kit and supplies KIT Dispense based on patient and insurance preference. Check blood glucose once daily. 1 each 0  . clopidogrel (PLAVIX) 75 MG tablet take 1 tablet by mouth once daily 30 tablet 5  . ENTRESTO 49-51 MG take 1 tablet by mouth twice a day 60 tablet 5  . furosemide (LASIX) 40 MG tablet Take 40 mg by mouth daily. Take 20 mg tablet every evening.    [name] glipiZIDE (GLUCOTROL) 10 MG tablet Take 1 tablet (10 mg total) by mouth daily before breakfast. 90 tablet 3  . ibuprofen (ADVIL,MOTRIN) 200 MG tablet Take 400-800 mg by mouth daily as needed for moderate pain.    . meclizine (ANTIVERT) 25 MG tablet take 1 tablet by mouth three times a day if needed for dizziness or nausea 30 tablet 1  . Menthol-Methyl Salicylate (MUSCLE RUB) 10-15 % CREA Apply 1 application topically daily.    . metoprolol succinate (TOPROL-XL) 25 MG 24 hr tablet take 1 tablet by mouth once daily 30 tablet 5  . pravastatin (PRAVACHOL) 80 MG tablet take 1 tablet by mouth once daily AT 6 PM 30  tablet 5  . sildenafil (REVATIO) 20 MG tablet Take 1-5 pills about 30 min prior to sex. Start with 1 and increase as needed. Caution stop taking if chest pain. Do not take with Nitro. 30 tablet 5  . spironolactone (ALDACTONE) 25 MG tablet Take 0.5 tablets (12.5 mg total) by mouth daily. 30 tablet 6   No current facility-administered medications for this visit.     Allergies  Allergen Reactions  . Tomato Rash      Review of Systems negative except from HPI and PMH  Physical Exam BP 100/70 (BP Location: Right Arm, Patient Position: Sitting, Cuff Size: Large)   Pulse 78   Ht 6' (1.829 m)   Wt (!) 316 lb 4 oz (143.5 kg)   BMI 42.89 kg/m  Well developed and well nourished in no acute distress HENT normal E scleral and icterus clear Neck Supple JVP flat;  Clear to ausculation Device pocket well healed; without hematoma or  erythema.  There is no tethering  Regular rate and rhythm, no murmurs gallops or rub Soft with active bowel sounds No clubbing cyanosis  Edema Alert and oriented, grossly normal motor and sensory function Skin Warm and Dry  ECG  Sinus 79 16/09/41 Nonspecific T wave abnormality  Assessment and  Plan  NICM  Congestive heart failure-chronic-systolic  ICD Medtronic   Sleep apnea-treated  Stroke-2  Morbid obesity  Renal dysfunction grade 3  VT Nonsustained   Euvolemic continue current meds  BP well controlled  VT Nonsustained  Will reprogram detection from 200>>188 and change NID 32>>48  Will see again in 9 months  Current medicines are reviewed at length with the patient today .  The patient does not  have concerns regarding medicines.

## 2017-03-23 ENCOUNTER — Ambulatory Visit: Payer: Medicaid Other | Admitting: Pharmacist

## 2017-03-23 ENCOUNTER — Encounter: Payer: Self-pay | Admitting: Pharmacist

## 2017-03-23 VITALS — BP 120/82 | Ht 72.0 in | Wt 315.0 lb

## 2017-03-23 DIAGNOSIS — Z79899 Other long term (current) drug therapy: Secondary | ICD-10-CM

## 2017-03-23 NOTE — Progress Notes (Signed)
[facility] Visit Note  Patient: Kent Stanley. MRN: [id number] Date of Birth: 08/06/1970 PCP: Olin Hauser, DO   Kent Stanley 46 y.o. male presents for his annual medication therapy management visit with the pharmacist today.  There were no vitals taken for this visit.  Patient Information   Past Medical History:  Diagnosis Date  . Cerebellar Stroke    a. 08/2015 MRI: Acute/subacute L superior cerebellar infarct. Remote R cerebellar infarct;  b. 08/2015 Carotid U/S: normal carotids.  . Chronic systolic heart failure ([place])    a. 10/2012 Echo: EF <20%, mod to sev dil LV, diast dysfxn, mildly dil LA/RA, mod to severe MR/TR.  . CKD (chronic kidney disease), stage II   . Coronary artery disease, non-occlusive    a. 09/2014 Cath: minor luminal irregularities with severely dilated left ventricle. LVEDP 30.  [name] H/O noncompliance with medical treatment, presenting hazards to health   . History of traumatic injury of head   . Hyperlipidemia   . Hypertension   . Morbid obesity ([place])   . NICM (nonischemic cardiomyopathy) ([place])    a. 10/2012 Echo: EF <20%.  . Polysubstance abuse    a. cocaine, tobacco, THC, ETOH  . Seizure disorder ([place])   . Stroke ([place])   . Type II diabetes mellitus ([place])       Past Surgical History:  Procedure Laterality Date  . CARDIAC CATHETERIZATION     [facility]  . ICD IMPLANT N/A 12/08/2016   Procedure: ICD Implant;  Surgeon: Deboraha Sprang, MD;  Location: [place] [facility];  Service: Cardiovascular;  Laterality: N/A;     Family History  Problem Relation Age of Onset  . Diabetes Mother   . Brain cancer Mother   . Heart attack Mother   . Sleep apnea Mother   . Heart failure Mother   . Hypertension Father   . Diabetes Father   . Heart disease Father   . Hyperlipidemia Father   . Heart attack Father   . Heart failure Father   . Sleep apnea Father   . Sleep apnea Sister   . Sleep apnea Brother   . Heart failure  Maternal Aunt     New Diagnoses (since last visit):   Family Support:   Lifestyle Diet: Breakfast: boiled eggs, [place] sausage or shake Lunch: Dinner: Drinks: Mainly water, some juice            History  Alcohol Use  . Yes    Comment: 12 beers a month per pt      History  Smoking Status  . Former Smoker  . Years: 10.00  . Types: Cigarettes  . Quit date: 12/19/2016  Smokeless Tobacco  . Never Used    Comment: smokes 7 cigarettes monthly 10-17-16      Health Maintenance  Topic Date Due  . HEMOGLOBIN A1C  11/12/2016  . OPHTHALMOLOGY EXAM  12/07/2016  . INFLUENZA VACCINE  02/04/2017  . URINE MICROALBUMIN  02/11/2017  . PNEUMOCOCCAL POLYSACCHARIDE VACCINE (1) 05/14/2021 (Originally 12/08/1970)  . FOOT EXAM  05/15/2017  . TETANUS/TDAP  07/08/2019  . HIV Screening  Completed    Health Maintenance/Date Completed  Last ED visit: none recent Last Visit to PCP: 01/2017 Next Visit to PCP:  Specialist Visit: 03/2017 Dental Exam: 2017 Eye Exam: 2017 Prostate Exam: yes; no issues Pelvic/PAP Exam: n/a Mammogram: n/a DEXA:  Colonoscopy: none Flu Vaccine: no; states gets the flu Pneumonia Vaccine: none   Prior to Admission  medications   Medication Sig Start Date End Date Taking? Authorizing Provider  blood glucose meter kit and supplies KIT Dispense based on patient and insurance preference. Check blood glucose once daily. 08/21/15  Yes [name], Genevie Cheshire, NP  clopidogrel (PLAVIX) 75 MG tablet take 1 tablet by mouth once daily 03/04/17  Yes Olin Hauser, DO  ENTRESTO 49-51 MG take 1 tablet by mouth twice a day 11/25/16  Yes [name], FNP  furosemide (LASIX) 40 MG tablet Take 40 mg by mouth daily. Take 20 mg tablet every evening.   Yes [provider]  glipiZIDE (GLUCOTROL) 10 MG tablet Take 1 tablet (10 mg total) by mouth daily before breakfast. 12/03/16  Yes Karamalegos, [name], DO  ibuprofen (ADVIL,MOTRIN) 200 MG tablet Take 400-800 mg  by mouth daily as needed for moderate pain.   Yes [provider]  meclizine (ANTIVERT) 25 MG tablet take 1 tablet by mouth three times a day if needed for dizziness or nausea 10/29/16  Yes Karamalegos, [name], DO  Menthol-Methyl Salicylate (MUSCLE RUB) 10-15 % CREA Apply 1 application topically daily.   Yes [provider]  metoprolol succinate (TOPROL-XL) 25 MG 24 hr tablet take 1 tablet by mouth once daily 02/09/17  Yes Karamalegos, [name], DO  pravastatin (PRAVACHOL) 80 MG tablet take 1 tablet by mouth once daily AT 6 PM 11/28/16  Yes Karamalegos, [name], DO  sildenafil (REVATIO) 20 MG tablet Take 1-5 pills about 30 min prior to sex. Start with 1 and increase as needed. Caution stop taking if chest pain. Do not take with Nitro. 12/30/16  Yes Karamalegos, [name], DO  spironolactone (ALDACTONE) 25 MG tablet Take 0.5 tablets (12.5 mg total) by mouth daily. 12/03/16  Yes Olin Hauser, DO    Assessment and Plan: Patient is seen by [facility], Dr. Parks Ranger and Dr. [name] and [facility]. Heart Failure/Hypertension:  Patient reports no hospitalizations for HF since started Entresto (Oct 17). ICD implanted 6/18 for primary prevention with NICM. Currently on Entresto, metoprolol, spironolactone and furosemide.  CAD:  Has not used nitroglycerin in over 9 months. Currently on pravastatin, metoprolol and ARB (Entresto). Stroke:  08/2015. Currently on clopidogrel. CKD:  GRF 47 ml/min 09/08/16 Hyperlipidemia: Currently on pravastatin 80 mg daily. TC = 127 mg/dl; TG = 108 mg/dl; HDL = 28 mg/dl; LDL = 77 mg/dl 02/12/16. Sleep Apnea:  Uses CPAP at night. Diabetes:  He is working on cutting carbohydrates. Occasionally has low blood sugar; typically sugars range 108-112 mg/dl; patient does have hypoglycemic awareness. Currently on glipizide. A1c was 8.8% on 05/15/16.  PLAN: [facility] will be assisting with the following medications: clopidogrel,  Entresto, furosemide, glipizide, meclizine, metoprolol, pravastatin and spironolactone. Follow up MTM with the pharmacist in 6 months.   Kedron Uno K. Dicky Doe, PharmD [facility] [place] Operations Coordinator [phone]

## 2017-03-30 ENCOUNTER — Ambulatory Visit: Payer: Medicaid Other | Admitting: Family

## 2017-04-02 ENCOUNTER — Ambulatory Visit: Payer: Medicaid Other | Admitting: Family

## 2017-04-03 ENCOUNTER — Encounter: Payer: Self-pay | Admitting: Family

## 2017-04-03 ENCOUNTER — Ambulatory Visit: Payer: Medicaid Other | Attending: Family | Admitting: Family

## 2017-04-03 VITALS — BP 115/73 | HR 75 | Resp 18 | Ht 72.0 in | Wt 316.2 lb

## 2017-04-03 DIAGNOSIS — Z87891 Personal history of nicotine dependence: Secondary | ICD-10-CM | POA: Diagnosis not present

## 2017-04-03 DIAGNOSIS — Z95810 Presence of automatic (implantable) cardiac defibrillator: Secondary | ICD-10-CM | POA: Insufficient documentation

## 2017-04-03 DIAGNOSIS — I509 Heart failure, unspecified: Secondary | ICD-10-CM | POA: Diagnosis present

## 2017-04-03 DIAGNOSIS — N182 Chronic kidney disease, stage 2 (mild): Secondary | ICD-10-CM | POA: Diagnosis not present

## 2017-04-03 DIAGNOSIS — E1122 Type 2 diabetes mellitus with diabetic chronic kidney disease: Secondary | ICD-10-CM | POA: Diagnosis not present

## 2017-04-03 DIAGNOSIS — R569 Unspecified convulsions: Secondary | ICD-10-CM | POA: Insufficient documentation

## 2017-04-03 DIAGNOSIS — I130 Hypertensive heart and chronic kidney disease with heart failure and stage 1 through stage 4 chronic kidney disease, or unspecified chronic kidney disease: Secondary | ICD-10-CM | POA: Diagnosis not present

## 2017-04-03 DIAGNOSIS — N183 Chronic kidney disease, stage 3 unspecified: Secondary | ICD-10-CM

## 2017-04-03 DIAGNOSIS — E785 Hyperlipidemia, unspecified: Secondary | ICD-10-CM | POA: Insufficient documentation

## 2017-04-03 DIAGNOSIS — I10 Essential (primary) hypertension: Secondary | ICD-10-CM

## 2017-04-03 DIAGNOSIS — I5022 Chronic systolic (congestive) heart failure: Secondary | ICD-10-CM

## 2017-04-03 NOTE — Patient Instructions (Signed)
Continue weighing daily and call for an overnight weight gain of > 2 pounds or a weekly weight gain of >5 pounds. 

## 2017-04-03 NOTE — Progress Notes (Addendum)
Agree with pharmacist note below.  Physical exam and ROS were done by myself.  Has recently started working PT at [employer]. Discussed food choices and he says that he's been eating fried chicken tenders that do not have any breading on them. They do not serve grilled chicken.   Return in 6 months or sooner for any questions/problems before then.  Darylene Price, FNP [facility] Clinic at [facility]    Patient ID: Kent Kindred., male    DOB: 08/31/1970, 46 y.o.   MRN: [id number]  HPI  Kent Stanley is a 46 y/o male with a history of recent stroke (Feb 2017), CKD stage 2, CAD, hyperlipidemia, HTN, morbid obesity, polysubstance abuse in the past, prior tobacco use, seizures, DM, ICD implantation and chronic heart failure.  Last echo done 10/24/16 which showed an EF of 15-20%. EF has declined from previous echo which was done 03/28/16 which showed an EF of 20-25% without regurgitation. Last catheterization was done March 2016.  Admitted 12/08/16 for same day for ICD implantation. Was in the ED 09/08/16 with acute left shoulder pain. Treated and released the same day. Admitted on 03/27/16 with heart failure exacerbation. Was treated with IV diuretics and spironolactone was added. Discussion about possible ICD in the future if the EF remains low. Discharged the next day.  He presents today for his follow-up. Says he has been doing well and eating healthier. Continuing to ride his stationary bike and will start going to the gym to lift weights. Denies any recent chest pain or edema. Continues to take daily weights, daily blood glucose and daily blood pressures at home. Met with medication management clinic 03/23/17 because will be losing his medicaid 04/06/17. Obtained a job through [employer] and says when working for at least 9 months, will be able to get Medicare for 7 years.   Past Medical History:  Diagnosis Date  . Cerebellar Stroke    a. 08/2015 MRI: Acute/subacute L superior cerebellar infarct. Remote R  cerebellar infarct;  b. 08/2015 Carotid U/S: normal carotids.  . CHF (congestive heart failure) ([place])   . Chronic systolic heart failure ([place])    a. 10/2012 Echo: EF <20%, mod to sev dil LV, diast dysfxn, mildly dil LA/RA, mod to severe Kent/TR.  . CKD (chronic kidney disease), stage II   . Coronary artery disease, non-occlusive    a. 09/2014 Cath: minor luminal irregularities with severely dilated left ventricle. LVEDP 30.  [name] H/O noncompliance with medical treatment, presenting hazards to health   . History of traumatic injury of head   . Hyperlipidemia   . Hypertension   . Morbid obesity ([place])   . NICM (nonischemic cardiomyopathy) ([place])    a. 10/2012 Echo: EF <20%.  . Polysubstance abuse    a. cocaine, tobacco, THC, ETOH  . Seizure disorder ([place])   . Stroke ([place])   . Type II diabetes mellitus ([place])    Past Surgical History:  Procedure Laterality Date  . CARDIAC CATHETERIZATION     [facility]  . ICD IMPLANT N/A 12/08/2016   Procedure: ICD Implant;  Surgeon: Deboraha Sprang, MD;  Location: [facility];  Service: Cardiovascular;  Laterality: N/A;   Family History  Problem Relation Age of Onset  . Diabetes Mother   . Brain cancer Mother   . Heart attack Mother   . Sleep apnea Mother   . Heart failure Mother   . Hypertension Father   . Diabetes Father   . Heart disease Father   .  Hyperlipidemia Father   . Heart attack Father   . Heart failure Father   . Sleep apnea Father   . Sleep apnea Sister   . Sleep apnea Brother   . Heart failure Maternal Aunt    Social History  Substance Use Topics  . Smoking status: Former Smoker    Years: 10.00    Types: Cigarettes    Quit date: 12/19/2016  . Smokeless tobacco: Never Used     Comment: smokes 7 cigarettes monthly 10-17-16  . Alcohol use Yes     Comment: 8-9 beers a month per pt   Allergies  Allergen Reactions  . Tomato Rash   Prior to Admission medications   Medication Sig Start Date End Date Taking? Authorizing Provider   albuterol (PROVENTIL HFA;VENTOLIN HFA) 108 (90 Base) MCG/ACT inhaler Inhale 2 puffs into the lungs every 6 (six) hours as needed for wheezing or shortness of breath. 03/28/16  Yes Amy Overton Mam, NP  aspirin EC 81 MG tablet Take 1 tablet (81 mg total) by mouth daily. 06/10/16  Yes Alexander Devin Going, DO  blood glucose meter kit and supplies KIT Dispense based on patient and insurance preference. Check blood glucose once daily. 08/21/15  Yes Amy Overton Mam, NP  clopidogrel (PLAVIX) 75 MG tablet Take 1 tablet (75 mg total) by mouth daily. Patient taking differently: Take 75 mg by mouth every morning.  02/12/16  Yes Amy Overton Mam, NP  furosemide (LASIX) 40 MG tablet Take 1 tablet (40 mg total) by mouth 2 (two) times daily. 08/20/16  Yes Alisa Graff, FNP  glipiZIDE (GLUCOTROL) 10 MG tablet Take 1 tablet (10 mg total) by mouth daily before breakfast. 06/10/16  Yes Olin Hauser, DO  meclizine (ANTIVERT) 25 MG tablet Take 1 tablet (25 mg total) by mouth 3 (three) times daily as needed for dizziness or nausea. 08/20/16  Yes Alisa Graff, FNP  metoprolol succinate (TOPROL-XL) 25 MG 24 hr tablet Take 25 mg by mouth every morning.    Yes Historical Provider, MD  nitroGLYCERIN (NITROSTAT) 0.4 MG SL tablet Place 1 tablet (0.4 mg total) under the tongue every 5 (five) minutes as needed for chest pain. 03/28/16  Yes Loletha Grayer, MD  pravastatin (PRAVACHOL) 80 MG tablet Take 1 tablet (80 mg total) by mouth daily at 6 PM. 09/12/15  Yes Amy Overton Mam, NP  sacubitril-valsartan (ENTRESTO) 49-51 MG Take 1 tablet by mouth 2 (two) times daily. 05/19/16  Yes Alisa Graff, FNP  sildenafil (REVATIO) 20 MG tablet Take 1-5 pills about 30 min prior to sex. Start with 1 and increase as needed. Caution stop taking if chest pain. Do not take with Nitro. 07/30/16  Yes Alexander Devin Going, DO  spironolactone (ALDACTONE) 25 MG tablet Take 0.5 tablets (12.5 mg total) by mouth daily. 05/27/16  Yes Wellington Hampshire, MD   Review of Systems  Constitutional: Negative for appetite change and fatigue.  HENT: Negative for congestion, postnasal drip and sore throat.   Eyes: Negative.   Respiratory: Negative for cough, chest tightness, shortness of breath and wheezing.   Cardiovascular: Positive for chest pain ("chronic angina"). Negative for palpitations and leg swelling.  Gastrointestinal: Negative for abdominal distention and abdominal pain.  Endocrine: Negative.   Genitourinary: Negative.   Musculoskeletal: Negative for back pain and neck pain.  Skin: Negative.   Allergic/Immunologic: Negative.   Neurological: Negative for dizziness and light-headedness.  Hematological: Negative for adenopathy. Does not bruise/bleed easily.  Psychiatric/Behavioral: Negative for dysphoric  mood and sleep disturbance (sleeping with CPAP about 5-6 hours a night). The patient is not nervous/anxious.    Vitals:   04/03/17 0832  BP: 115/73  Pulse: 75  Resp: 18  SpO2: 99%  Weight: (!) 316 lb 4 oz (143.5 kg)  Height: 6' (1.829 m)   Wt Readings from Last 3 Encounters:  04/03/17 (!) 316 lb 4 oz (143.5 kg)  03/23/17 (!) 315 lb (142.9 kg)  03/10/17 (!) 316 lb 4 oz (143.5 kg)    Lab Results  Component Value Date   CREATININE 1.61 (H) 12/02/2016   CREATININE 1.88 (H) 09/08/2016   CREATININE 1.44 (H) 05/19/2016    Physical Exam  Constitutional: He is oriented to person, place, and time. He appears well-developed and well-nourished.  HENT:  Head: Normocephalic and atraumatic.  Neck: Normal range of motion. Neck supple. No JVD present.  Cardiovascular: Normal rate.  An irregular rhythm present.  Pulmonary/Chest: Effort normal. He has no wheezes. He has no rales.  Abdominal: Soft. He exhibits no distension. There is no tenderness.  Musculoskeletal: He exhibits no edema or tenderness.  Neurological: He is alert and oriented to person, place, and time.  Skin: Skin is warm and dry.  Psychiatric: He has a normal  mood and affect. His behavior is normal. Thought content normal.  Nursing note and vitals reviewed.   Assessment & Plan:  1: Chronic heart failure with reduced ejection fraction- - NYHA Class I - euvolemic today - continue weighing daily; To call for an overnight weight gain of >2 pounds or a weekly weight gain of >5 pounds. Has gained 9 pounds since he was last here - Using Mrs[employer]; says he makes his food at home such as air fried chicken and fresh veggies; will continue to follow a '2000mg'$ /day sodium diet  - continuing to ride stationary bike for 60 minutes 5 days a week; States he is going to get back to the gym to lift weights. - taking furosemide '40mg'$  AM and '20mg'$  every other PM.  - Saw cardiologist Fletcher [name]) 09/23/16 - saw EP [name]) 03/30/17 - had AICD placed and incision has healed well from that - does not meet ReDS criteria due to BMI  2: HTN- - BP looks good today; a little on the low end so will hold off on increasing Entresto  - Checks BP daily and says it runs around 115/70 at home   3: Diabetes- - glucose has been running in the 120-130's when he checks after a meal   Medication list was verbally reveiwed. Reminded patient to bring in medication bottles to next visit.   Patient will be losing his medicaid as of 04/06/17. Met with medication management clinic 03/23/17. Has a job he obtained through vocational rehab and will get Medicare once working for at least 9 months.   Return in 6 months or sooner for any questions/problems before then.   Candelaria Stagers, PharmD Pharmacy Resident  04/03/17

## 2017-04-09 ENCOUNTER — Telehealth: Payer: Self-pay | Admitting: Pharmacy Technician

## 2017-04-09 NOTE — Telephone Encounter (Signed)
Patient eligible to receive medication assistance at [facility] through 2018, as long as eligibility requirements continue to be met.  Dupree Givler J. Oren Barella Care Manager [facility] 

## 2017-04-22 ENCOUNTER — Other Ambulatory Visit: Payer: Self-pay | Admitting: Family Medicine

## 2017-04-22 DIAGNOSIS — I5022 Chronic systolic (congestive) heart failure: Secondary | ICD-10-CM

## 2017-04-22 DIAGNOSIS — E118 Type 2 diabetes mellitus with unspecified complications: Secondary | ICD-10-CM

## 2017-04-22 NOTE — Telephone Encounter (Signed)
Pt needs prescriptions for glipizide and spironolatone faxed to [facility] [phone]

## 2017-04-23 MED ORDER — GLIPIZIDE 10 MG PO TABS: 10.0000 mg | ORAL_TABLET | Freq: Every day | ORAL | 3 refills | Status: DC

## 2017-04-23 MED ORDER — SPIRONOLACTONE 25 MG PO TABS: 12.5000 mg | ORAL_TABLET | Freq: Every day | ORAL | 3 refills | Status: DC

## 2017-04-23 NOTE — Telephone Encounter (Signed)
Both rx printed, ready to be faxed to [facility] as listed.  Saralyn Pilar, DO [facility] 04/23/2017, 12:28 PM

## 2017-04-24 ENCOUNTER — Other Ambulatory Visit: Payer: Self-pay

## 2017-04-24 NOTE — Telephone Encounter (Signed)
The prescription was faxed over to [facility] at [phone].

## 2017-04-30 IMAGING — CR DG CHEST 2V
1 series · 2 of 2 positions shown · non-contrast
Comparison: 03/27/2016

CLINICAL DATA: Left arm and chest pain for 5 days

EXAM:
CHEST  2 VIEW

[Series 1: w chest pa · 0.14mm/px · 2 of 2 slices shown]
[im 1/2]
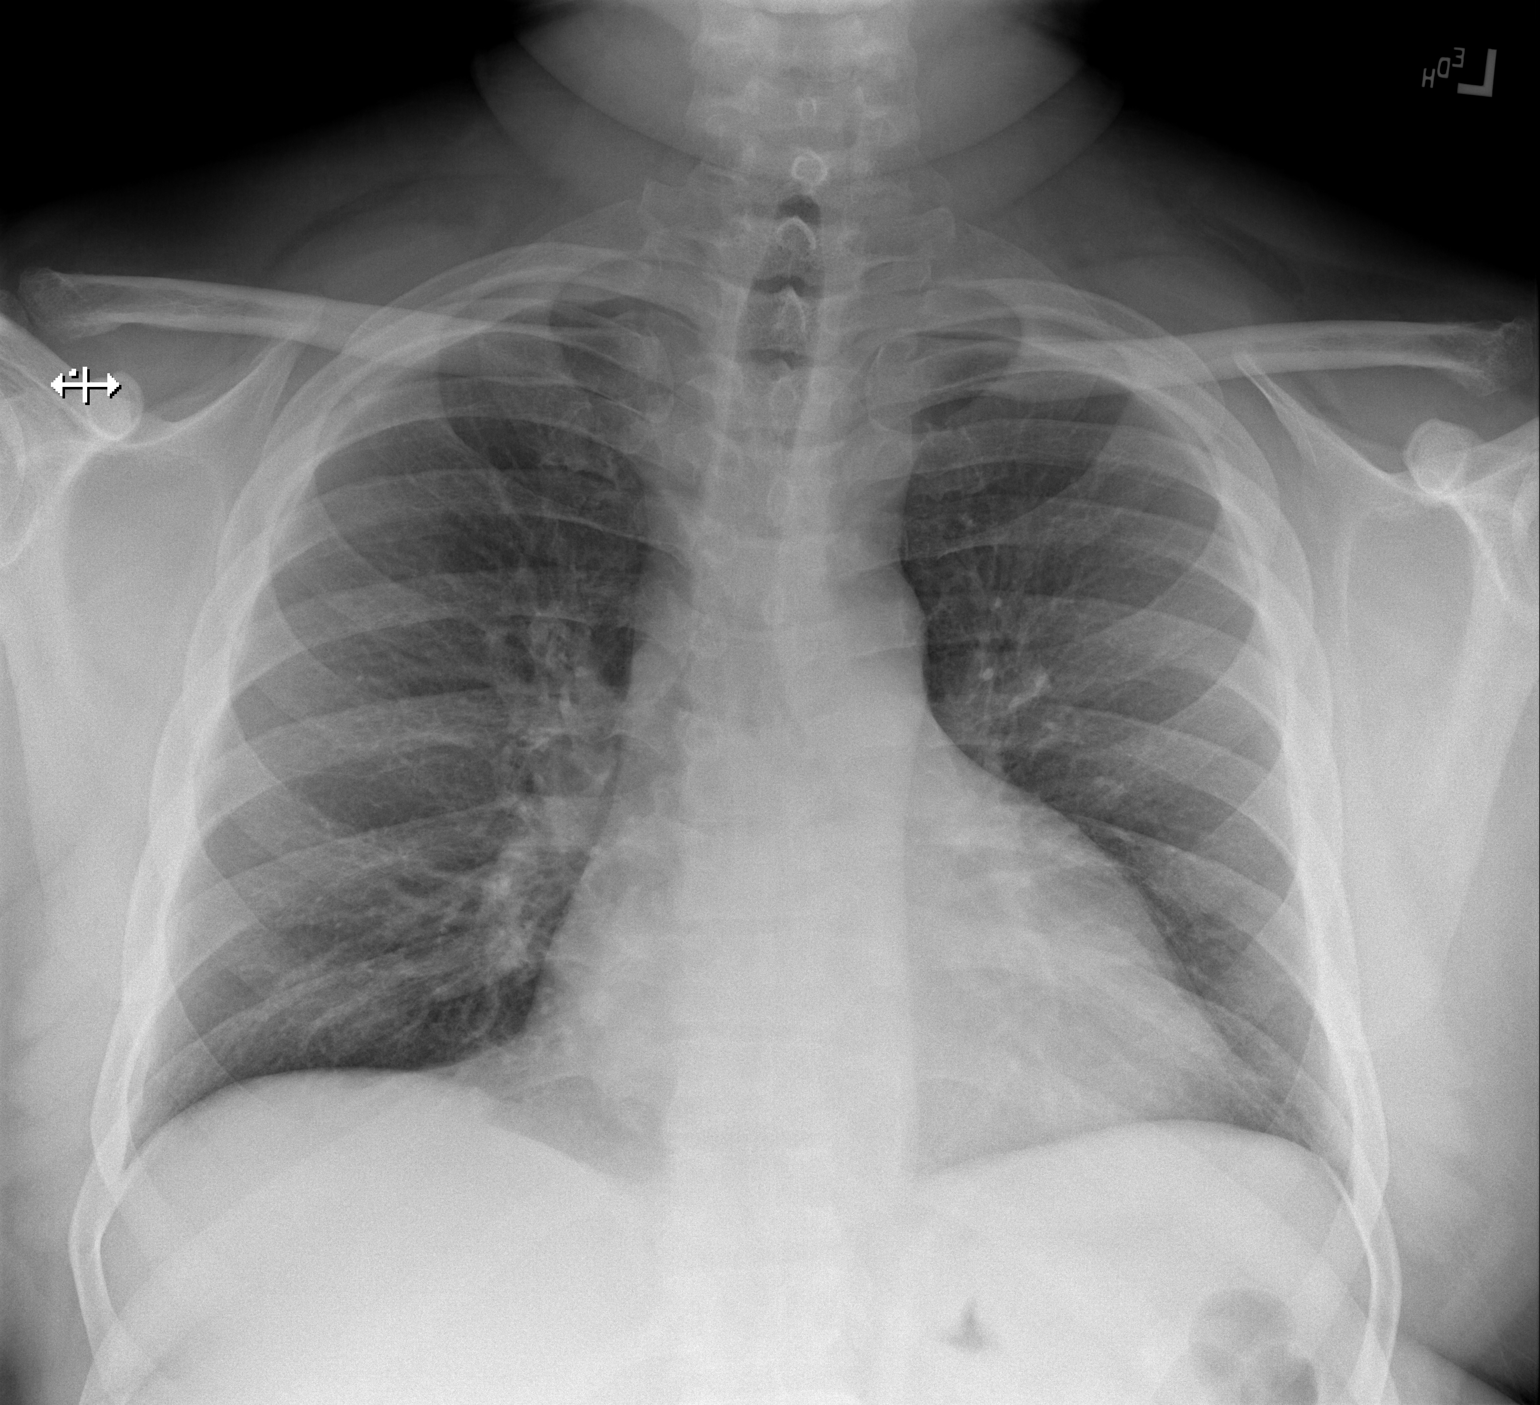
[im 2/2]
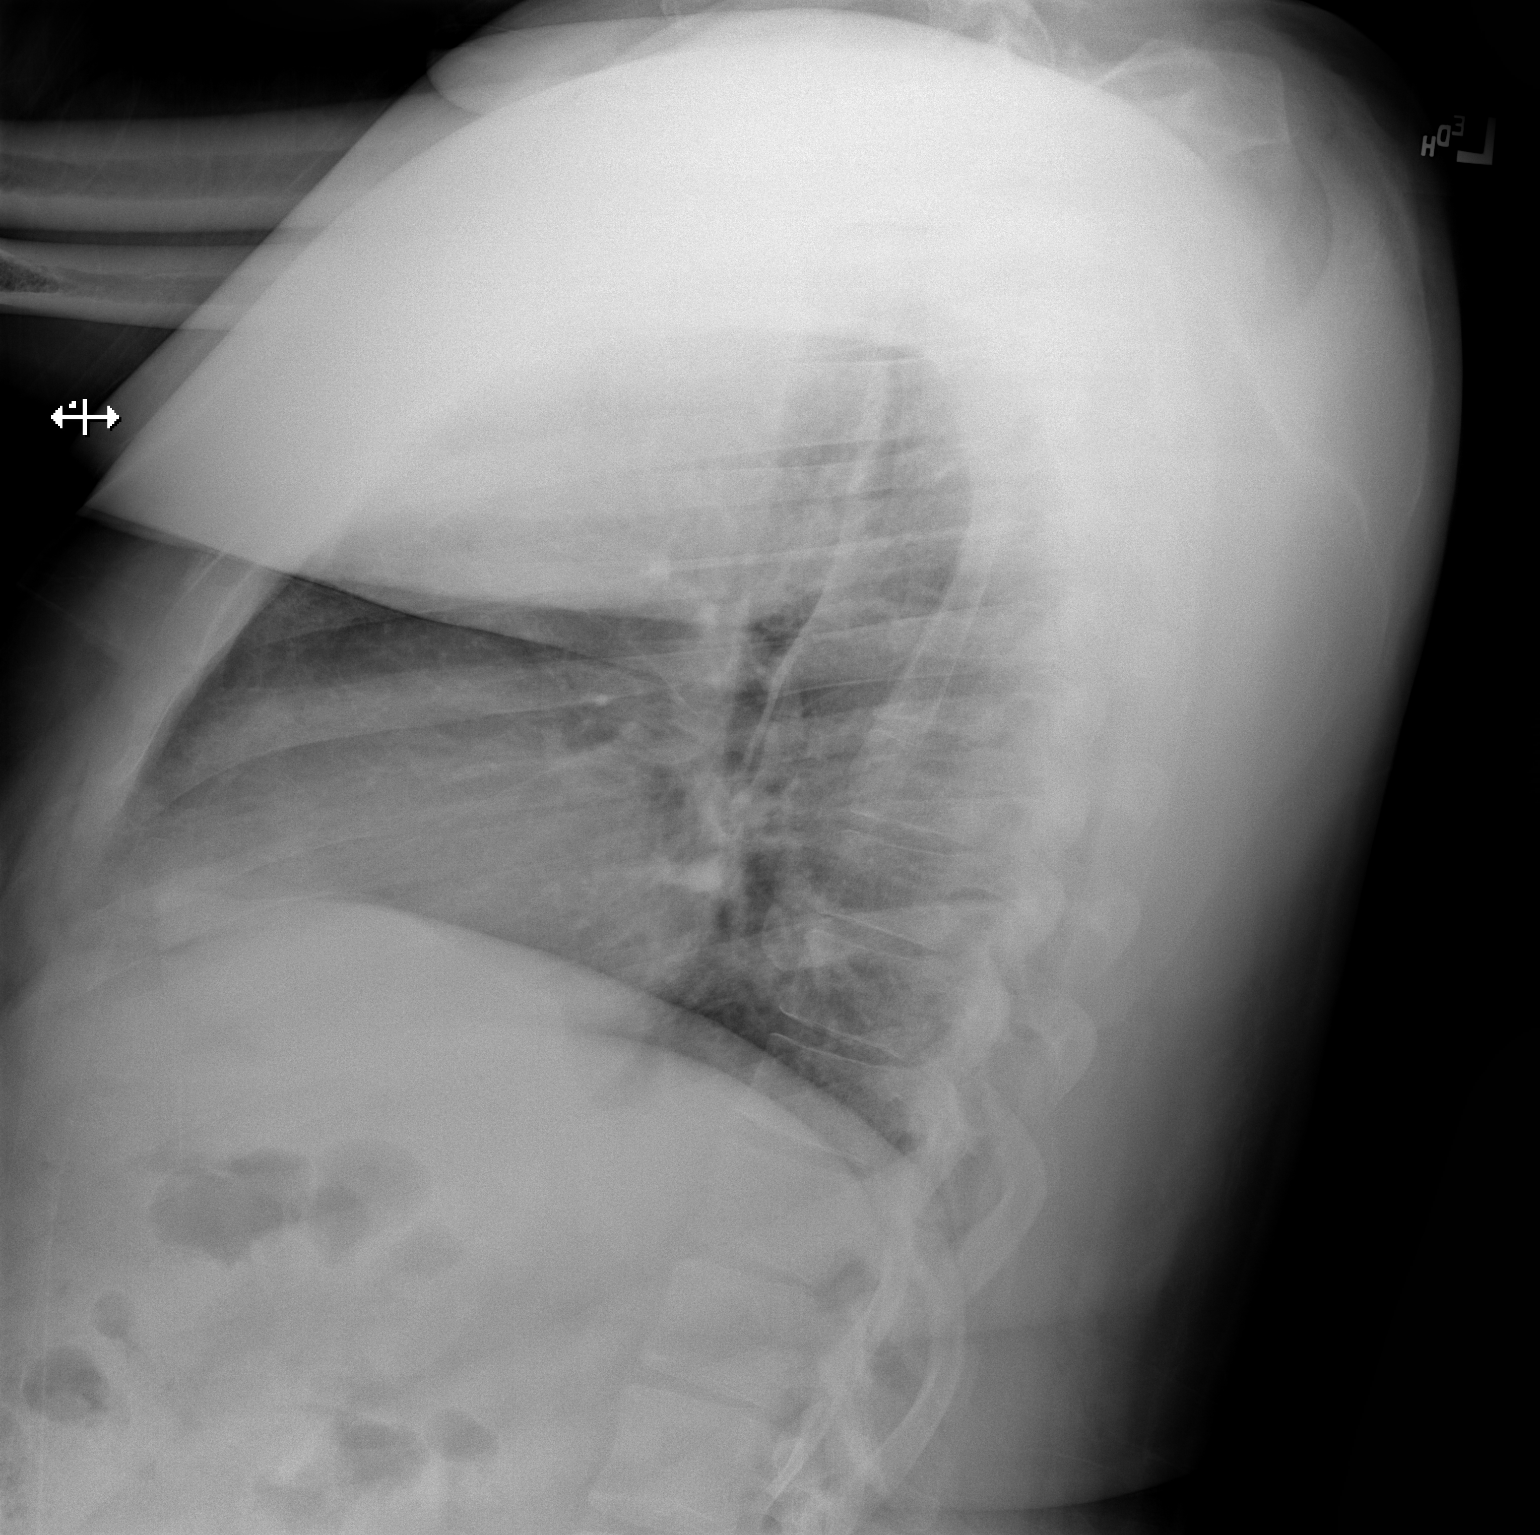

[2 of 2 positions shown; findings below may reference images not displayed]

FINDINGS: Heart is borderline in size. Lungs are clear. No effusions or acute
bony abnormality.
IMPRESSION: No active cardiopulmonary disease.

## 2017-05-05 ENCOUNTER — Encounter: Payer: Self-pay | Admitting: Family Medicine

## 2017-05-05 ENCOUNTER — Other Ambulatory Visit: Payer: Self-pay

## 2017-06-09 ENCOUNTER — Encounter: Payer: Self-pay | Admitting: *Deleted

## 2017-06-09 ENCOUNTER — Telehealth: Payer: Self-pay | Admitting: Cardiology

## 2017-06-09 NOTE — Telephone Encounter (Signed)
LMOVM reminding pt to send remote transmission.   

## 2017-06-11 ENCOUNTER — Encounter: Payer: Self-pay | Admitting: Cardiology

## 2017-06-25 ENCOUNTER — Ambulatory Visit (INDEPENDENT_AMBULATORY_CARE_PROVIDER_SITE_OTHER): Payer: Self-pay | Admitting: *Deleted

## 2017-06-25 DIAGNOSIS — I5022 Chronic systolic (congestive) heart failure: Secondary | ICD-10-CM

## 2017-06-25 DIAGNOSIS — I428 Other cardiomyopathies: Secondary | ICD-10-CM

## 2017-06-25 NOTE — Progress Notes (Signed)
Remote ICD transmission.   

## 2017-06-26 ENCOUNTER — Encounter: Payer: Self-pay | Admitting: Cardiology

## 2017-07-08 ENCOUNTER — Other Ambulatory Visit: Payer: Self-pay | Admitting: Family

## 2017-07-08 DIAGNOSIS — I5022 Chronic systolic (congestive) heart failure: Secondary | ICD-10-CM

## 2017-07-08 MED ORDER — FUROSEMIDE 40 MG PO TABS: 40.0000 mg | ORAL_TABLET | Freq: Every day | ORAL | 3 refills | Status: DC

## 2017-07-09 ENCOUNTER — Other Ambulatory Visit: Payer: Self-pay

## 2017-07-09 DIAGNOSIS — I10 Essential (primary) hypertension: Secondary | ICD-10-CM

## 2017-07-09 DIAGNOSIS — I5022 Chronic systolic (congestive) heart failure: Secondary | ICD-10-CM

## 2017-07-09 LAB — CUP PACEART REMOTE DEVICE CHECK
Battery Remaining Longevity: 135 mo
Battery Voltage: 3.1 V
Brady Statistic RV Percent Paced: 0.01 %
HIGH POWER IMPEDANCE MEASURED VALUE: 76 Ohm
Implantable Lead Implant Date: 20180604
Implantable Lead Model: 293
Implantable Pulse Generator Implant Date: 20180604
Lead Channel Impedance Value: 342 Ohm
Lead Channel Impedance Value: 342 Ohm
Lead Channel Sensing Intrinsic Amplitude: 9.75 mV
Lead Channel Sensing Intrinsic Amplitude: 9.75 mV
Lead Channel Setting Pacing Amplitude: 3.75 V
Lead Channel Setting Pacing Pulse Width: 0.4 ms
MDC IDC LEAD LOCATION: 753860
MDC IDC LEAD SERIAL: 433424
MDC IDC MSMT LEADCHNL RV PACING THRESHOLD AMPLITUDE: 1.875 V
MDC IDC MSMT LEADCHNL RV PACING THRESHOLD PULSEWIDTH: 0.4 ms
MDC IDC SESS DTM: 20181220142156
MDC IDC SET LEADCHNL RV SENSING SENSITIVITY: 0.3 mV

## 2017-07-09 MED ORDER — METOPROLOL SUCCINATE ER 25 MG PO TB24: 25.0000 mg | ORAL_TABLET | Freq: Every day | ORAL | 5 refills | Status: DC

## 2017-08-21 ENCOUNTER — Telehealth: Payer: Self-pay | Admitting: Family Medicine

## 2017-08-21 NOTE — Telephone Encounter (Signed)
Signed paper request refill. To be faxed  Saralyn Pilar, DO [facility] 08/21/2017, 4:37 PM

## 2017-08-21 NOTE — Telephone Encounter (Signed)
Pt. Called states that he no longer have  MCD  And was going to  [facility] and was request a refill on  Pravastatin Fax: [phone]>>>>>>>>>> Pt. Call back # is  [phone]

## 2017-09-10 ENCOUNTER — Telehealth: Payer: Self-pay | Admitting: Pharmacy Technician

## 2017-09-10 NOTE — Telephone Encounter (Signed)
Will have Medicare Parts A, B & D beginning December 05, 2017.  Patient acknowledged that he understood and [facility] will not be able to provide medication assistance after Dec 04, 2017.  Sherilyn Dacosta Care Manager [facility]

## 2017-09-22 ENCOUNTER — Other Ambulatory Visit: Payer: Self-pay

## 2017-09-22 DIAGNOSIS — Z8673 Personal history of transient ischemic attack (TIA), and cerebral infarction without residual deficits: Secondary | ICD-10-CM

## 2017-09-22 MED ORDER — CLOPIDOGREL BISULFATE 75 MG PO TABS: 75.0000 mg | ORAL_TABLET | Freq: Every day | ORAL | 0 refills | Status: DC

## 2017-09-22 NOTE — Telephone Encounter (Signed)
Need to schedule office visit or request from Cardiologist, will give one temporary 30 day supply for now.

## 2017-09-23 NOTE — Telephone Encounter (Signed)
Left detailed message.   

## 2017-09-24 ENCOUNTER — Ambulatory Visit (INDEPENDENT_AMBULATORY_CARE_PROVIDER_SITE_OTHER): Payer: Self-pay | Admitting: *Deleted

## 2017-09-24 DIAGNOSIS — I5022 Chronic systolic (congestive) heart failure: Secondary | ICD-10-CM

## 2017-09-24 DIAGNOSIS — I428 Other cardiomyopathies: Secondary | ICD-10-CM

## 2017-09-24 NOTE — Progress Notes (Signed)
Remote ICD transmission.   

## 2017-09-25 ENCOUNTER — Encounter: Payer: Self-pay | Admitting: Cardiology

## 2017-09-28 ENCOUNTER — Encounter: Payer: Self-pay | Admitting: Pharmacist

## 2017-09-30 ENCOUNTER — Ambulatory Visit: Payer: Medicaid Other | Admitting: Family

## 2017-10-16 LAB — CUP PACEART REMOTE DEVICE CHECK
Brady Statistic RV Percent Paced: 0.07 %
Date Time Interrogation Session: 20190321073323
HIGH POWER IMPEDANCE MEASURED VALUE: 70 Ohm
Implantable Lead Implant Date: 20180604
Implantable Lead Location: 753860
Implantable Lead Model: 293
Implantable Lead Serial Number: 433424
Lead Channel Impedance Value: 342 Ohm
Lead Channel Impedance Value: 361 Ohm
Lead Channel Pacing Threshold Amplitude: 1.875 V
Lead Channel Sensing Intrinsic Amplitude: 11.25 mV
Lead Channel Setting Sensing Sensitivity: 0.3 mV
MDC IDC MSMT BATTERY REMAINING LONGEVITY: 134 mo
MDC IDC MSMT BATTERY VOLTAGE: 3.06 V
MDC IDC MSMT LEADCHNL RV PACING THRESHOLD PULSEWIDTH: 0.4 ms
MDC IDC MSMT LEADCHNL RV SENSING INTR AMPL: 11.25 mV
MDC IDC PG IMPLANT DT: 20180604
MDC IDC SET LEADCHNL RV PACING AMPLITUDE: 3.75 V
MDC IDC SET LEADCHNL RV PACING PULSEWIDTH: 0.4 ms

## 2017-10-19 ENCOUNTER — Other Ambulatory Visit: Payer: Self-pay

## 2017-10-19 ENCOUNTER — Ambulatory Visit: Payer: Self-pay | Admitting: Pharmacist

## 2017-10-19 ENCOUNTER — Encounter: Payer: Self-pay | Admitting: Pharmacist

## 2017-10-19 VITALS — BP 118/72 | Ht 72.0 in | Wt 280.0 lb

## 2017-10-19 DIAGNOSIS — Z79899 Other long term (current) drug therapy: Secondary | ICD-10-CM

## 2017-10-19 NOTE — Progress Notes (Deleted)
[facility] Visit Note  Patient: Kent Stanley. MRN: [id number] Date of Birth: 01-Oct-1970 PCP: Olin Hauser, DO   [name] 47 y.o. male presents for a medication therapy management visit with the pharmacist today.  There were no vitals taken for this visit.  Patient Information   Past Medical History:  Diagnosis Date  . Cerebellar Stroke    a. 08/2015 MRI: Acute/subacute L superior cerebellar infarct. Remote R cerebellar infarct;  b. 08/2015 Carotid U/S: normal carotids.  . CHF (congestive heart failure) ([place])   . Chronic systolic heart failure ([place])    a. 10/2012 Echo: EF <20%, mod to sev dil LV, diast dysfxn, mildly dil LA/RA, mod to severe MR/TR.  . CKD (chronic kidney disease), stage II   . Coronary artery disease, non-occlusive    a. 09/2014 Cath: minor luminal irregularities with severely dilated left ventricle. LVEDP 30.  [name] H/O noncompliance with medical treatment, presenting hazards to health   . History of traumatic injury of head   . Hyperlipidemia   . Hypertension   . Morbid obesity ([place])   . NICM (nonischemic cardiomyopathy) ([place])    a. 10/2012 Echo: EF <20%.  . Polysubstance abuse    a. cocaine, tobacco, THC, ETOH  . Seizure disorder ([place])   . Stroke ([place])   . Type II diabetes mellitus ([place])       Past Surgical History:  Procedure Laterality Date  . CARDIAC CATHETERIZATION     [facility]  . ICD IMPLANT N/A 12/08/2016   Procedure: ICD Implant;  Surgeon: Deboraha Sprang, MD;  Location: [facility];  Service: Cardiovascular;  Laterality: N/A;     Family History  Problem Relation Age of Onset  . Diabetes Mother   . Brain cancer Mother   . Heart attack Mother   . Sleep apnea Mother   . Heart failure Mother   . Hypertension Father   . Diabetes Father   . Heart disease Father   . Hyperlipidemia Father   . Heart attack Father   . Heart failure Father   . Sleep apnea Father   . Sleep apnea Sister   . Sleep  apnea Brother   . Heart failure Maternal Aunt    . New Diagnoses (since last visit):   Family Support: Good  Lifestyle Diet: Breakfast: usually doesn't eat breakfast, sometimes has a steak, egg and cheese biscuit  Lunch/Dinner (usually eats one 'good' meal per day): chicken, fish (deep fried ~2/week), uses the air fryer mostly  Drinks: has cut out sodas, and teas and replaced with crystal lite             Social History   Substance and Sexual Activity  Alcohol Use Yes   Comment: 8-9 beers a month per pt      Social History   Tobacco Use  Smoking Status Former Smoker  . Years: 10.00  . Types: Cigarettes  . Last attempt to quit: 12/19/2016  . Years since quitting: 0.8  Smokeless Tobacco Never Used  Tobacco Comment   smokes 7 cigarettes monthly 10-17-16      Health Maintenance  Topic Date Due  . HEMOGLOBIN A1C  11/12/2016  . OPHTHALMOLOGY EXAM  12/07/2016  . URINE MICROALBUMIN  02/11/2017  . FOOT EXAM  05/15/2017  . PNEUMOCOCCAL POLYSACCHARIDE VACCINE (1) 05/14/2021 (Originally 12/08/1970)  . INFLUENZA VACCINE  02/04/2018  . TETANUS/TDAP  07/08/2019  . HIV Screening  Completed   Outpatient Encounter Medications as of  10/19/2017  Medication Sig  . blood glucose meter kit and supplies KIT Dispense based on patient and insurance preference. Check blood glucose once daily.  . clopidogrel (PLAVIX) 75 MG tablet Take 1 tablet (75 mg total) by mouth daily.  [name] ENTRESTO 49-51 MG take 1 tablet by mouth twice a day  . furosemide (LASIX) 40 MG tablet Take 1 tablet (40 mg total) by mouth daily. Take 20 mg tablet every evening.  [name] glipiZIDE (GLUCOTROL) 10 MG tablet Take 1 tablet (10 mg total) by mouth daily before breakfast.  . ibuprofen (ADVIL,MOTRIN) 200 MG tablet Take 400-800 mg by mouth daily as needed for moderate pain.  . meclizine (ANTIVERT) 25 MG tablet take 1 tablet by mouth three times a day if needed for dizziness or nausea  . metoprolol succinate (TOPROL-XL) 25 MG 24  hr tablet Take 1 tablet (25 mg total) by mouth daily.  . pravastatin (PRAVACHOL) 80 MG tablet take 1 tablet by mouth once daily AT 6 PM  . sildenafil (REVATIO) 20 MG tablet Take 1-5 pills about 30 min prior to sex. Start with 1 and increase as needed. Caution stop taking if chest pain. Do not take with Nitro.  . spironolactone (ALDACTONE) 25 MG tablet Take 0.5 tablets (12.5 mg total) by mouth daily.  . Menthol-Methyl Salicylate (MUSCLE RUB) 10-15 % CREA Apply 1 application topically daily.   No facility-administered encounter medications on file as of 10/19/2017.     Assessment and Plan:  Medication Compliance: uses pill box takes when he first wakes up   Heart Failure:   Diabetes:  Hypertension:  CAD:  History of Stroke:  Dyslipidemia:  CKD:   Lewie Loron, PharmD Candidate

## 2017-10-19 NOTE — Progress Notes (Signed)
Kent Stanley was here today for his 6 month medication review follow up with the pharmacist. He was scheduled for Medicare counseling today as well.   He will be eligible to sign up for Medicare June 1st, 2019. He has not received his Medicare card yet, so I was not able to review the available Medicare Part D or Advantage plans.  The patient is going to call Social Security to see if they can send his Medicare card and find out exactly what date his coverage will start. [facility] will continue to assist with his medications until his Medicare Part D prescription coverage is active.  He will not qualify for subsidy such as LIS or MSP at this time as he is also working. *MSP = Medicare Savings Plan (Assists with Medicare A and B - premiums, deductibles, coinsurance and copayments) *LIS = Low Income Subsidy (Assists with Medicare Part D - premiums, deductibles and copayments)     PLAN: Patient will call [facility] once he receives his Medicare card and we will review the available plans.  Aparna Vanderweele K. Joelene Millin, PharmD [facility] Clinic-Pharmacy Operations Coordinator [phone]

## 2017-10-19 NOTE — Progress Notes (Addendum)
[facility]ication Management Clinic Visit Note  Patient: Kent Stanley. MRN: [id number] Date of Birth: May 17, 1971 PCP: Olin Hauser, DO   Kent Stanley 47 y.o. male presents for a medication therapy managment visit with the pharmacist today.  BP 118/72 (BP Location: Right Arm, Patient Position: Sitting, Cuff Size: Large)   Wt 280 lb (127 kg)   BMI 37.97 kg/m   Patient Information   Past Medical History:  Diagnosis Date  . Cerebellar Stroke    a. 08/2015 MRI: Acute/subacute L superior cerebellar infarct. Remote R cerebellar infarct;  b. 08/2015 Carotid U/S: normal carotids.  . CHF (congestive heart failure) ([place])   . Chronic systolic heart failure ([place])    a. 10/2012 Echo: EF <20%, mod to sev dil LV, diast dysfxn, mildly dil LA/RA, mod to severe MR/TR.  . CKD (chronic kidney disease), stage II   . Coronary artery disease, non-occlusive    a. 09/2014 Cath: minor luminal irregularities with severely dilated left ventricle. LVEDP 30.  [name] H/O noncompliance with medical treatment, presenting hazards to health   . History of traumatic injury of head   . Hyperlipidemia   . Hypertension   . Morbid obesity ([place])   . NICM (nonischemic cardiomyopathy) ([place])    a. 10/2012 Echo: EF <20%.  . Polysubstance abuse ([place])    a. cocaine, tobacco, THC, ETOH  . Seizure disorder ([place])   . Stroke ([place])   . Type II diabetes mellitus ([place])       Past Surgical History:  Procedure Laterality Date  . CARDIAC CATHETERIZATION     [facility]  . ICD IMPLANT N/A 12/08/2016   Procedure: ICD Implant;  Surgeon: Deboraha Sprang, MD;  Location: [facility];  Service: Cardiovascular;  Laterality: N/A;     Family History  Problem Relation Age of Onset  . Diabetes Mother   . Brain cancer Mother   . Heart attack Mother   . Sleep apnea Mother   . Heart failure Mother   . Hypertension Father   . Diabetes Father   . Heart disease Father   . Hyperlipidemia Father   . Heart attack Father   .  Heart failure Father   . Sleep apnea Father   . Sleep apnea Sister   . Sleep apnea Brother   . Heart failure Maternal Aunt    Health Maintenance/Date Completed  Last ED visit: none recent Last Visit to PCP: unsure ~2-3 months ago Next Visit to PCP: TBD Specialist Visit: none scheduled  Dental Exam: 2018 Eye Exam: 2018 Prostate Exam: yes; no issues Colonoscopy: none Flu Vaccine: no; states gets the flu Pneumonia Vaccine: none   New Diagnoses (since last visit): NA Family Support: Good  Lifestyle Exercise:  Was previously riding an exercise bike for 1.5 hours per day but has not been doing this recently. Plans to resume this or join [employer] soon.   Diet: Breakfast:usually does not eat breakfast, but if he does he will usually have a steak, egg and cheese biscuit Lunch/Dinner: (usually has one meal per day) chicken in the air fryer, fish in the deep fryer ~2 times per week, vegetables, limits carbohydrates  Drinks: had eliminated sodas and sweet tea and stated drinking crystal light packs in his water instead   Has lost ~ 50 lbs over the last year through diet and exercise, plans to resume exercise and limiting carbohydrates again with the goal of losing an additional ~40 lbs over the next year. Patient seems  highly motivated to do so for his health.          Social History   Substance and Sexual Activity  Alcohol Use Yes   Comment: 8-9 beers a month per pt    Social History   Tobacco Use  Smoking Status Current Some Day Smoker  . Types: Cigars  Smokeless Tobacco Never Used  Tobacco Comment   smokes ~10 black and milds per week     Health Maintenance  Topic Date Due  . HEMOGLOBIN A1C  11/12/2016  . OPHTHALMOLOGY EXAM  12/07/2016  . URINE MICROALBUMIN  02/11/2017  . FOOT EXAM  05/15/2017  . PNEUMOCOCCAL POLYSACCHARIDE VACCINE (1) 05/14/2021 (Originally [birth year]/1970)  . INFLUENZA VACCINE  02/04/2018  . TETANUS/TDAP  07/08/2019  . HIV Screening  Completed    Outpatient Encounter Medications as of 10/19/2017  Medication Sig  . blood glucose meter kit and supplies KIT Dispense based on patient and insurance preference. Check blood glucose once daily.  . clopidogrel (PLAVIX) 75 MG tablet Take 1 tablet (75 mg total) by mouth daily.  [name] ENTRESTO 49-51 MG take 1 tablet by mouth twice a day  . furosemide (LASIX) 40 MG tablet Take 1 tablet (40 mg total) by mouth daily. Take 20 mg tablet every evening.  [name] glipiZIDE (GLUCOTROL) 10 MG tablet Take 1 tablet (10 mg total) by mouth daily before breakfast.  . ibuprofen (ADVIL,MOTRIN) 200 MG tablet Take 400-800 mg by mouth daily as needed for moderate pain.  . meclizine (ANTIVERT) 25 MG tablet take 1 tablet by mouth three times a day if needed for dizziness or nausea  . metoprolol succinate (TOPROL-XL) 25 MG 24 hr tablet Take 1 tablet (25 mg total) by mouth daily.  . pravastatin (PRAVACHOL) 80 MG tablet take 1 tablet by mouth once daily AT 6 PM  . sildenafil (REVATIO) 20 MG tablet Take 1-5 pills about 30 min prior to sex. Start with 1 and increase as needed. Caution stop taking if chest pain. Do not take with Nitro.  . spironolactone (ALDACTONE) 25 MG tablet Take 0.5 tablets (12.5 mg total) by mouth daily.  . Menthol-Methyl Salicylate (MUSCLE RUB) 10-15 % CREA Apply 1 application topically daily.   No facility-administered encounter medications on file as of 10/19/2017.     Assessment and Plan:  Medication Compliance: Patient is able to correctly state the name of his mediations, their indication, dose and frequency. States that he uses a pill box and never misses any doses.   Heart Failure: Patient is currently taking Entresto, furosemide, metoprolol, and spironolactone. Currently feels asymptomatic and well controlled. Tolerates medications well with no noticeable adverse effects. States that he loves being on Entresto because it has kept him out of the hospital.  History of Stroke: Patient is currently taking  clopidogrel and is requesting assistance obtaining a refill on this today. Communication will be sent in order to obtain a refill to last until he receives Medicare and can make a PCP appointment.   Diabetes:Patient is currently taking glipizide, but is only taking 5 mg daily (rather than the 10 mg prescribed) because he becomes hypoglycemic when taking 10 mg after eliminating most of the sugar and carbohydrates from his diet. States that he monitors his blood glucose ~2-3 times per week and that it is usually in the low 100's. Encouraged him to make his PCP aware of this at his next visit and keep up the excellent lifestyle modifications he has made so far.   HTN: Patient is  currently taking metoprolol and an ARB Delene Loll). Well tolerated and well controlled. Does not currently monitor his BP at home. BP at goal in office today.   Dyslipidemia: Patient is currently taking pravastatin, well tolerated. No labs available for review today.   Lewie Loron, PharmD Candidate  Cosigned:Christan Helene Kelp, PharmD, [facility] [place]) [phone]

## 2017-10-20 ENCOUNTER — Other Ambulatory Visit: Payer: Self-pay | Admitting: Family

## 2017-10-20 DIAGNOSIS — Z8673 Personal history of transient ischemic attack (TIA), and cerebral infarction without residual deficits: Secondary | ICD-10-CM

## 2017-10-20 MED ORDER — CLOPIDOGREL BISULFATE 75 MG PO TABS: 75.0000 mg | ORAL_TABLET | Freq: Every day | ORAL | 0 refills | Status: DC

## 2017-11-23 ENCOUNTER — Ambulatory Visit (INDEPENDENT_AMBULATORY_CARE_PROVIDER_SITE_OTHER): Payer: Self-pay | Admitting: Family Medicine

## 2017-11-23 ENCOUNTER — Encounter: Payer: Self-pay | Admitting: Family Medicine

## 2017-11-23 VITALS — BP 126/71 | HR 112 | Temp 97.9°F | Resp 16 | Ht 72.0 in | Wt 295.0 lb

## 2017-11-23 DIAGNOSIS — N183 Chronic kidney disease, stage 3 unspecified: Secondary | ICD-10-CM

## 2017-11-23 DIAGNOSIS — N528 Other male erectile dysfunction: Secondary | ICD-10-CM

## 2017-11-23 DIAGNOSIS — N5201 Erectile dysfunction due to arterial insufficiency: Secondary | ICD-10-CM

## 2017-11-23 DIAGNOSIS — I428 Other cardiomyopathies: Secondary | ICD-10-CM

## 2017-11-23 DIAGNOSIS — E782 Mixed hyperlipidemia: Secondary | ICD-10-CM

## 2017-11-23 DIAGNOSIS — I5022 Chronic systolic (congestive) heart failure: Secondary | ICD-10-CM

## 2017-11-23 DIAGNOSIS — I10 Essential (primary) hypertension: Secondary | ICD-10-CM

## 2017-11-23 MED ORDER — PRAVASTATIN SODIUM 40 MG PO TABS: 80.0000 mg | ORAL_TABLET | Freq: Every day | ORAL | 2 refills | Status: DC

## 2017-11-23 MED ORDER — SILDENAFIL CITRATE 20 MG PO TABS: 100.0000 mg | ORAL_TABLET | ORAL | 5 refills | Status: DC | PRN

## 2017-11-23 NOTE — Assessment & Plan Note (Signed)
Gradual weight gain again now from 280 to 295 lb +15 lb in past month, previously >40-50 lbs with lifestyle improvement Encourage resume diet and exercise regimen, goal 1 lb down weekly

## 2017-11-23 NOTE — Assessment & Plan Note (Addendum)
Consistent with likely multifactorial ED secondary to vascular/neurogenic etiology in setting of significant DM, prior CVA, HLD, HTN, obesity. Not on NTG, only Entrestro   Plan: 1. Refill Sildenafil 20mg  tabs - take 5 for dose 100mg  PRN - #60 +5 refills sent to [facility] - Reviewed risks on this medication given his chronic medical conditions, and reinforced absolutely contraindicated with NTG. If concerning symptoms needs to stop and seek immediate medical attention 2. Follow-up as needed - after Cardiology  Note had to refuse last rx refill request since patient not seen for this med since 07/2016. He was self pay for while, now will get Medicare soon and encouraged to follow-up in office more regularly in future.

## 2017-11-23 NOTE — Patient Instructions (Addendum)
Thank you for coming to the office today.  E-script for Sildenafil 20mg  tabs - take 5 as needed - #60 - with 5 refills - sent to [facility] - little after 2pm today let me know if any problem w/ this rx and we can fix it  Also sent e-script Pravastatin 40mg  tabs - take 2 daily for 80mg  - sent to [facility]  Please schedule a Follow-up Appointment to: Return in about 3 months (around 02/23/2018) for Diabetes A1c, (Urine micro, Foot Exam, DM eye refer).  If you have any other questions or concerns, please feel free to call the office or send a message through MyChart. You may also schedule an earlier appointment if necessary.  Additionally, you may be receiving a survey about your experience at our office within a few days to 1 week by e-mail or mail. We value your feedback.  Saralyn Pilar, DO [facility], [place]

## 2017-11-23 NOTE — Assessment & Plan Note (Signed)
Due for future labs Previous Cr stable elevated trend Secondary to HTN and DM among other factors Currently remains on diuretic, per Cardiology for significant CHF

## 2017-11-23 NOTE — Assessment & Plan Note (Signed)
Well-controlled HTN - Home BP readings none  Complication CKD-III, CHF    Plan:  1. Continue current BP regimen - Entresto 49-51mg  daily, Furosemide 40mg  daily, Spironolactone 25mg  daily, Metoprolol XL 25mg  daily 2. Encourage improved lifestyle - low sodium diet, regular exercise - restart 3. Start monitor BP outside office, bring readings to next visit, if persistently >140/90 or new symptoms notify office sooner 4. Follow-up 3 months

## 2017-11-23 NOTE — Progress Notes (Signed)
Subjective:    Patient ID: Kent Geralds., male    DOB: [birth year], 47 y.o.   MRN: [id number]  Kent Lomeli. is a 47 y.o. male presenting on 11/23/2017 for Hypertension and Erectile Dysfunction   HPI   Last visit with me 07/2016, he had been off insurance for while after lost medicaid, and was lost to follow-up. Now, he is currently waiting on Medicare insurance, awaiting card that was mailed. He is currently working part time. He is getting all medications through [facility].  CHRONIC HTN w/ CKD-III Reports currently BP controlled, not checking outside office. Has not returned to cardiology Current Meds - Entresto 49-51mg  daily, Furosemide 40mg  daily, Spironolactone 25mg  daily, Metoprolol XL 25mg  daily  Reports good compliance, took meds today. Tolerating well, w/o complaints. Denies CP, dyspnea, HA, edema, dizziness / lightheadedness  Chronic Systolic CHF (NICM) - NYHA 1 / HLD / S/p AICD Last visit with Cardiology 03/2017, he will return once re-establish with Medicare, also followed with EP Dr Graciela Husbands  - Reports no concerns. Last lipid panel 11/2016 - Currently taking Pravastatin 80mg  (40mg  x 2 tabs daily), tolerating well without side effects or myalgias - Denies significant swelling of lower extremity, chest pain or dyspnea  Morbid Obesity BMI >40 He had history weight loss down to 269 lbs, then he had life stressor mom was ill and given 6 months to live, he had some adjustment and mood problem. He admits stress eating and weight gain again. He has just resumed improve exercise and return to gym with plans to lose weight again.  Erectile Dysfunction: Today request refill Sildenafil to continue this rx - previously get rx at [facility] pharmacy d/t discount, other rx at med management center, taking generic Sildenafil 20mg  x 5 tabs for 100mg  dose PRN for ED with good results. He is asymptomatic on medicine, without side effects, denies any chest pain  or other symptoms. Cardiology has cleared him in past to take this med. - Unable to get significant full erection every time without medicine, he still has spontaneous erections including overnight / waking up. - He is aware of contraindication with viagra type meds and nitroglycerin tabs and is not taking any NTG - Admits to good sexual desire  Depression screen [facility] 2/9 11/23/2017 04/03/2017 02/18/2017  Decreased Interest 0 0 0  Down, Depressed, Hopeless 0 0 1  PHQ - 2 Score 0 0 1  Altered sleeping - - -  Tired, decreased energy - - -  Change in appetite - - -  Feeling bad or failure about yourself  - - -  Trouble concentrating - - -  Moving slowly or fidgety/restless - - -  Suicidal thoughts - - -  PHQ-9 Score - - -  Difficult doing work/chores - - -    Social History   Tobacco Use  . Smoking status: Current Some Day Smoker    Types: Cigars  . Smokeless tobacco: Never Used  . Tobacco comment: smokes ~10 black and milds per week  Substance Use Topics  . Alcohol use: Yes    Comment: 8-9 beers a month per pt  . Drug use: No    Review of Systems Per HPI unless specifically indicated above     Objective:    BP 126/71   Pulse (!) 112   Temp 97.9 F (36.6 C) (Oral)   Resp 16   Ht 6' (1.829 m)   Wt 295 lb (133.8 kg)  BMI 40.01 kg/m   Wt Readings from Last 3 Encounters:  11/23/17 295 lb (133.8 kg)  10/19/17 280 lb (127 kg)  04/03/17 (!) 316 lb 4 oz (143.5 kg)    Physical Exam  Constitutional: He is oriented to person, place, and time. He appears well-developed and well-nourished. No distress.  Well-appearing, comfortable, cooperative, morbidly obese  HENT:  Head: Normocephalic and atraumatic.  Mouth/Throat: Oropharynx is clear and moist.  Eyes: Conjunctivae are normal. Right eye exhibits no discharge. Left eye exhibits no discharge.  Neck: Normal range of motion. Neck supple.  Cardiovascular: Normal rate, regular rhythm, normal heart sounds and intact distal  pulses.  No murmur heard. Pulmonary/Chest: Effort normal and breath sounds normal. No respiratory distress. He has no wheezes. He has no rales.  Musculoskeletal: Normal range of motion. He exhibits no edema.  Neurological: He is alert and oriented to person, place, and time.  Skin: Skin is warm and dry. No rash noted. He is not diaphoretic. No erythema.  Psychiatric: He has a normal mood and affect. His behavior is normal.  Well groomed, good eye contact, normal speech and thoughts  Nursing note and vitals reviewed.  Results for orders placed or performed in visit on 09/24/17  CUP PACEART REMOTE DEVICE CHECK  Result Value Ref Range   Date Time Interrogation Session [id number]    Pulse Generator Manufacturer MERM    Pulse Gen Model [id number] Visia AF MRI VR    Pulse Gen Serial Number [id number]    Clinic Name [facility]    Implantable Pulse Generator Type Implantable Cardiac Defibulator    Implantable Pulse Generator Implant Date [id number]    Implantable Lead Manufacturer BOST    Implantable Lead Model [id number] Reliance 4-Site SG    Implantable Lead Serial Number [id number]    Implantable Lead Implant Date [id number]    Implantable Lead Location Detail 1 UNKNOWN    Implantable Lead Location [phone]    Lead Channel Setting Sensing Sensitivity 0.3 mV   Lead Channel Setting Pacing Pulse Width 0.4 ms   Lead Channel Setting Pacing Amplitude 3.75 V   Lead Channel Impedance Value 342 ohm   Lead Channel Impedance Value 361 ohm   Lead Channel Sensing Intrinsic Amplitude 11.25 mV   Lead Channel Sensing Intrinsic Amplitude 11.25 mV   Lead Channel Pacing Threshold Amplitude 1.875 V   Lead Channel Pacing Threshold Pulse Width 0.4 ms   HighPow Impedance 70 ohm   Battery Status OK    Battery Remaining Longevity 134 mo   Battery Voltage 3.06 V   Brady Statistic RV Percent Paced 0.07 %   Eval Rhythm VS       Assessment & Plan:   Problem List Items Addressed This Visit    Chronic systolic  heart failure (HCC)    Stable chronic CHF, appears euvolemic but some weight gain, without acute exacerbation Recent wt gain due to inc stress eating / mood, limited lifestyle adherence Controlled on current med regimen Last ECHO 2018 Followed by Cardiology and EP Return after get Medicare 12/2017      Relevant Medications   pravastatin (PRAVACHOL) 40 MG tablet   sildenafil (REVATIO) 20 MG tablet   CKD (chronic kidney disease), stage III (HCC)    Due for future labs Previous Cr stable elevated trend Secondary to HTN and DM among other factors Currently remains on diuretic, per Cardiology for significant CHF      Erectile dysfunction - Primary    Consistent with likely multifactorial ED secondary  to vascular/neurogenic etiology in setting of significant DM, prior CVA, HLD, HTN, obesity. Not on NTG, only Entrestro   Plan: 1. Refill Sildenafil 20mg  tabs - take 5 for dose 100mg  PRN - #60 +5 refills sent to [facility] - Reviewed risks on this medication given his chronic medical conditions, and reinforced absolutely contraindicated with NTG. If concerning symptoms needs to stop and seek immediate medical attention 2. Follow-up as needed - after Cardiology  Note had to refuse last rx refill request since patient not seen for this med since 07/2016. He was self pay for while, now will get Medicare soon and encouraged to follow-up in office more regularly in future.      Relevant Medications   sildenafil (REVATIO) 20 MG tablet   Hyperlipidemia    Stable on Statin Controlled on Pravastatin, due for lipids this year at future lab Refilled Pravastatin 40mg  tabs x 2 for 80mg  daily - [facility] to [facility]      Relevant Medications   pravastatin (PRAVACHOL) 40 MG tablet   sildenafil (REVATIO) 20 MG tablet   Hypertension    Well-controlled HTN - Home BP readings none  Complication CKD-III, CHF    Plan:  1. Continue current BP regimen - Entresto 49-51mg  daily, Furosemide  40mg  daily, Spironolactone 25mg  daily, Metoprolol XL 25mg  daily 2. Encourage improved lifestyle - low sodium diet, regular exercise - restart 3. Start monitor BP outside office, bring readings to next visit, if persistently >140/90 or new symptoms notify office sooner 4. Follow-up 3 months      Relevant Medications   pravastatin (PRAVACHOL) 40 MG tablet   sildenafil (REVATIO) 20 MG tablet   Morbid obesity (HCC)    Gradual weight gain again now from 280 to 295 lb +15 lb in past month, previously >40-50 lbs with lifestyle improvement Encourage resume diet and exercise regimen, goal 1 lb down weekly      NICM (nonischemic cardiomyopathy) (HCC)    Underlying etiology for systolic CHF and now has AICD Followed by Cardiology and EP, will return after Medicare arranged 12/2017      Relevant Medications   pravastatin (PRAVACHOL) 40 MG tablet   sildenafil (REVATIO) 20 MG tablet      Meds ordered this encounter  Medications  . pravastatin (PRAVACHOL) 40 MG tablet    Sig: Take 2 tablets (80 mg total) by mouth daily.    Dispense:  60 tablet    Refill:  2  . sildenafil (REVATIO) 20 MG tablet    Sig: Take 5 tablets (100 mg total) by mouth as needed. Take about 30 min prior to sex. Caution stop taking if chest pain. Do not take with Nitro.    Dispense:  60 tablet    Refill:  5    Follow up plan: Return in about 3 months (around 02/23/2018) for Diabetes A1c, (Urine micro, Foot Exam, DM eye refer).  Saralyn Pilar, DO [facility] 11/23/2017, 7:59 PM

## 2017-11-23 NOTE — Assessment & Plan Note (Signed)
Underlying etiology for systolic CHF and now has AICD Followed by Cardiology and EP, will return after Medicare arranged 12/2017

## 2017-11-23 NOTE — Assessment & Plan Note (Signed)
Stable on Statin Controlled on Pravastatin, due for lipids this year at future lab Refilled Pravastatin 40mg  tabs x 2 for 80mg  daily - sent to [facility]

## 2017-11-23 NOTE — Assessment & Plan Note (Signed)
Stable chronic CHF, appears euvolemic but some weight gain, without acute exacerbation Recent wt gain due to inc stress eating / mood, limited lifestyle adherence Controlled on current med regimen Last ECHO 2018 Followed by Cardiology and EP Return after get Medicare 12/2017

## 2017-12-09 ENCOUNTER — Ambulatory Visit (INDEPENDENT_AMBULATORY_CARE_PROVIDER_SITE_OTHER): Payer: Self-pay | Admitting: Family Medicine

## 2017-12-09 ENCOUNTER — Encounter: Payer: Self-pay | Admitting: Family Medicine

## 2017-12-09 ENCOUNTER — Ambulatory Visit: Payer: Self-pay | Admitting: Family Medicine

## 2017-12-09 VITALS — BP 130/87 | HR 87 | Temp 98.7°F | Resp 16 | Ht 72.0 in | Wt 301.0 lb

## 2017-12-09 DIAGNOSIS — H1033 Unspecified acute conjunctivitis, bilateral: Secondary | ICD-10-CM

## 2017-12-09 MED ORDER — POLYMYXIN B-TRIMETHOPRIM 10000-0.1 UNIT/ML-% OP SOLN: 1.0000 [drp] | Freq: Four times a day (QID) | OPHTHALMIC | 0 refills | Status: DC

## 2017-12-09 NOTE — Progress Notes (Signed)
Subjective:    Patient ID: Kent Geralds., male    DOB: 08/25/1970, 47 y.o.   MRN: [id number]  Kent Kienast. is a 47 y.o. male presenting on 12/09/2017 for Conjunctivitis (both but Right side is worst, itchy, redness onset 2 days)  HPI   Conjunctivitis Bilateral, Acute Reports symptoms started about 2 days ago with Left eye first redness and irritation with itching and watering, he recently returned from beach trip, no known sick contacts. Symptoms spread to R eye now with bilateral eye redness irritation. He has some watering and drainage, not described as purulent. Admits some URI symptoms congestion at times. Similar to prior pink eye, he was told to leave work early last night and come get this checked out today. Admits some chronic mild blurry vision with reading, unchanged now Denies any eye injury or trauma or scratch, fevers chills or sweats, nausea vomiting, rash, headache, vision loss or change   Depression screen [facility] 2/9 12/09/2017 11/23/2017 04/03/2017  Decreased Interest 0 0 0  Down, Depressed, Hopeless 0 0 0  PHQ - 2 Score 0 0 0  Altered sleeping - - -  Tired, decreased energy - - -  Change in appetite - - -  Feeling bad or failure about yourself  - - -  Trouble concentrating - - -  Moving slowly or fidgety/restless - - -  Suicidal thoughts - - -  PHQ-9 Score - - -  Difficult doing work/chores - - -    Social History   Tobacco Use  . Smoking status: Current Some Day Smoker    Types: Cigars  . Smokeless tobacco: Never Used  . Tobacco comment: smokes ~10 black and milds per week  Substance Use Topics  . Alcohol use: Yes    Comment: 8-9 beers a month per pt  . Drug use: No    Review of Systems Per HPI unless specifically indicated above     Objective:    BP 130/87   Pulse 87   Temp 98.7 F (37.1 C) (Oral)   Resp 16   Ht 6' (1.829 m)   Wt (!) 301 lb (136.5 kg)   BMI 40.82 kg/m   Wt Readings from Last 3 Encounters:  12/09/17 (!) 301 lb  (136.5 kg)  11/23/17 295 lb (133.8 kg)  10/19/17 280 lb (127 kg)    Physical Exam  Constitutional: He is oriented to person, place, and time. He appears well-developed and well-nourished. No distress.  Well-appearing, comfortable, cooperative  HENT:  Head: Normocephalic and atraumatic.  Mouth/Throat: Oropharynx is clear and moist.  Eyes: Pupils are equal, round, and reactive to light. EOM are normal. Right eye exhibits discharge. Left eye exhibits discharge.  Bilateral Eyes - Symmetrical with some generalized conjunctival injection redness, without focal abnormality - Clear discharge bilateral - Surrounding periorbital skin without edema, erythema or tenderness - Visual acuity intact grossly - no pain on exam or with EOMI  Cardiovascular: Normal rate.  Pulmonary/Chest: Effort normal.  Musculoskeletal: He exhibits no edema.  Neurological: He is alert and oriented to person, place, and time.  Skin: Skin is warm and dry. No rash noted. He is not diaphoretic. No erythema.  Psychiatric: He has a normal mood and affect. His behavior is normal.  Well groomed, good eye contact, normal speech and thoughts  Nursing note and vitals reviewed.      Assessment & Plan:   Problem List Items Addressed This Visit    None  Visit Diagnoses    Acute conjunctivitis of both eyes, unspecified acute conjunctivitis type    -  Primary   Relevant Medications   trimethoprim-polymyxin b (POLYTRIM) ophthalmic solution      Acute bilateral conjunctivitis for past 2 days, with mild to moderate scleral/conjunctival injection currently with clear discharge. Suspected viral vs bacterial etiology without sick contact and with associated URI symptoms, also likely allergic component with more clear discharge and itching. - Normal visual acuity in office - No evidence of complication, foreign body, or extending eyelid / pre-septal cellulitis - Improved on warm compress at home   Plan: 1. Reassurance, most  likely self limited, empiric coverage with start Polytrim antibiotic eye drops 1 drop in both eye every 4-6 hours for up to 7 days 2. Start moist warm compresses bilateral  3. Frequent hand washing, avoid rubbing / scratching eye 4. Strict return precautions for spreading infection 5. Follow-up 1-2 weeks as needed - Strict return precautions if significant worsening changes loss vision or pain given his co morbid history of morbid obesity, HTN, vascular problems and DM he may be higher risk of other eye pathology he would warrant more prompt follow-up or go to hospital ED or eye doctor as advised  Note for work   Meds ordered this encounter  Medications  . trimethoprim-polymyxin b (POLYTRIM) ophthalmic solution    Sig: Place 1 drop into both eyes every 6 (six) hours. For 5-7 days    Dispense:  10 mL    Refill:  0     Follow up plan: Return in about 1 week (around 12/16/2017), or if symptoms worsen or fail to improve, for conjunctivitis.  Saralyn Pilar, DO [facility] 12/09/2017, 11:40 AM

## 2017-12-09 NOTE — Patient Instructions (Addendum)
Thank you for coming to the office today.  Possibly pink eye or viral conjunctivitis  Treat with Polytrim antibiotic eye drops 4 drops a day in each eye - about every 4-6 hours, use for 5 to 7 days  May use gentle warm compresses, avoid scratching  Consider allergic conjunctivitis - may be due to allergy - try OTC anti-histamine eye drops to relieve itching and irritation  If worsening symptoms with loss of vision or significant eye pain or swelling, contact office or go to hospital ED or Eye Doctor  Please schedule a Follow-up Appointment to: Return in about 1 week (around 12/16/2017), or if symptoms worsen or fail to improve, for conjunctivitis.  If you have any other questions or concerns, please feel free to call the office or send a message through MyChart. You may also schedule an earlier appointment if necessary.  Additionally, you may be receiving a survey about your experience at our office within a few days to 1 week by e-mail or mail. We value your feedback.  Saralyn Pilar, DO [facility], [place]

## 2017-12-24 ENCOUNTER — Ambulatory Visit (INDEPENDENT_AMBULATORY_CARE_PROVIDER_SITE_OTHER): Payer: Self-pay | Admitting: *Deleted

## 2017-12-24 DIAGNOSIS — I5022 Chronic systolic (congestive) heart failure: Secondary | ICD-10-CM

## 2017-12-24 NOTE — Progress Notes (Signed)
Remote ICD transmission.   

## 2018-01-01 ENCOUNTER — Other Ambulatory Visit (HOSPITAL_COMMUNITY): Payer: Self-pay

## 2018-01-01 ENCOUNTER — Emergency Department: Payer: Medicaid Other

## 2018-01-01 ENCOUNTER — Inpatient Hospital Stay (HOSPITAL_COMMUNITY)
Admission: AD | Admit: 2018-01-01 | Discharge: 2018-01-04 | DRG: 065 | Disposition: A | Discharge status: Home / Self Care | Payer: Medicaid Other | Source: Other Acute Inpatient Hospital | Attending: Neurology | Admitting: Neurology

## 2018-01-01 ENCOUNTER — Emergency Department
Admission: EM | Admit: 2018-01-01 | Discharge: 2018-01-01 | Disposition: A | Discharge status: Other Acute Inpatient Hospital | Payer: Medicaid Other | Attending: Emergency Medicine | Admitting: Emergency Medicine

## 2018-01-01 DIAGNOSIS — I10 Essential (primary) hypertension: Secondary | ICD-10-CM | POA: Diagnosis present

## 2018-01-01 DIAGNOSIS — Z95810 Presence of automatic (implantable) cardiac defibrillator: Secondary | ICD-10-CM | POA: Diagnosis not present

## 2018-01-01 DIAGNOSIS — I130 Hypertensive heart and chronic kidney disease with heart failure and stage 1 through stage 4 chronic kidney disease, or unspecified chronic kidney disease: Secondary | ICD-10-CM | POA: Insufficient documentation

## 2018-01-01 DIAGNOSIS — I480 Paroxysmal atrial fibrillation: Secondary | ICD-10-CM | POA: Diagnosis not present

## 2018-01-01 DIAGNOSIS — F17200 Nicotine dependence, unspecified, uncomplicated: Secondary | ICD-10-CM | POA: Insufficient documentation

## 2018-01-01 DIAGNOSIS — Z6841 Body Mass Index (BMI) 40.0 and over, adult: Secondary | ICD-10-CM | POA: Diagnosis not present

## 2018-01-01 DIAGNOSIS — Z9119 Patient's noncompliance with other medical treatment and regimen: Secondary | ICD-10-CM

## 2018-01-01 DIAGNOSIS — I639 Cerebral infarction, unspecified: Secondary | ICD-10-CM | POA: Insufficient documentation

## 2018-01-01 DIAGNOSIS — I4891 Unspecified atrial fibrillation: Secondary | ICD-10-CM | POA: Diagnosis not present

## 2018-01-01 DIAGNOSIS — I69354 Hemiplegia and hemiparesis following cerebral infarction affecting left non-dominant side: Secondary | ICD-10-CM

## 2018-01-01 DIAGNOSIS — R471 Dysarthria and anarthria: Secondary | ICD-10-CM | POA: Diagnosis present

## 2018-01-01 DIAGNOSIS — Z87820 Personal history of traumatic brain injury: Secondary | ICD-10-CM

## 2018-01-01 DIAGNOSIS — G40909 Epilepsy, unspecified, not intractable, without status epilepticus: Secondary | ICD-10-CM | POA: Diagnosis present

## 2018-01-01 DIAGNOSIS — Z79899 Other long term (current) drug therapy: Secondary | ICD-10-CM | POA: Insufficient documentation

## 2018-01-01 DIAGNOSIS — I6340 Cerebral infarction due to embolism of unspecified cerebral artery: Principal | ICD-10-CM | POA: Diagnosis present

## 2018-01-01 DIAGNOSIS — G4733 Obstructive sleep apnea (adult) (pediatric): Secondary | ICD-10-CM | POA: Diagnosis not present

## 2018-01-01 DIAGNOSIS — F17290 Nicotine dependence, other tobacco product, uncomplicated: Secondary | ICD-10-CM | POA: Diagnosis present

## 2018-01-01 DIAGNOSIS — I428 Other cardiomyopathies: Secondary | ICD-10-CM | POA: Diagnosis present

## 2018-01-01 DIAGNOSIS — E785 Hyperlipidemia, unspecified: Secondary | ICD-10-CM | POA: Diagnosis not present

## 2018-01-01 DIAGNOSIS — Z9282 Status post administration of tPA (rtPA) in a different facility within the last 24 hours prior to admission to current facility: Secondary | ICD-10-CM | POA: Diagnosis not present

## 2018-01-01 DIAGNOSIS — E119 Type 2 diabetes mellitus without complications: Secondary | ICD-10-CM | POA: Diagnosis not present

## 2018-01-01 DIAGNOSIS — I5022 Chronic systolic (congestive) heart failure: Secondary | ICD-10-CM | POA: Insufficient documentation

## 2018-01-01 DIAGNOSIS — N182 Chronic kidney disease, stage 2 (mild): Secondary | ICD-10-CM | POA: Diagnosis present

## 2018-01-01 DIAGNOSIS — Z8673 Personal history of transient ischemic attack (TIA), and cerebral infarction without residual deficits: Secondary | ICD-10-CM | POA: Diagnosis present

## 2018-01-01 DIAGNOSIS — R531 Weakness: Secondary | ICD-10-CM | POA: Diagnosis present

## 2018-01-01 DIAGNOSIS — Z8249 Family history of ischemic heart disease and other diseases of the circulatory system: Secondary | ICD-10-CM

## 2018-01-01 DIAGNOSIS — Z716 Tobacco abuse counseling: Secondary | ICD-10-CM | POA: Diagnosis not present

## 2018-01-01 DIAGNOSIS — E1122 Type 2 diabetes mellitus with diabetic chronic kidney disease: Secondary | ICD-10-CM | POA: Diagnosis present

## 2018-01-01 DIAGNOSIS — Z833 Family history of diabetes mellitus: Secondary | ICD-10-CM | POA: Diagnosis not present

## 2018-01-01 DIAGNOSIS — I63441 Cerebral infarction due to embolism of right cerebellar artery: Secondary | ICD-10-CM | POA: Diagnosis not present

## 2018-01-01 DIAGNOSIS — I2510 Atherosclerotic heart disease of native coronary artery without angina pectoris: Secondary | ICD-10-CM | POA: Diagnosis present

## 2018-01-01 DIAGNOSIS — E118 Type 2 diabetes mellitus with unspecified complications: Secondary | ICD-10-CM

## 2018-01-01 DIAGNOSIS — E1169 Type 2 diabetes mellitus with other specified complication: Secondary | ICD-10-CM | POA: Diagnosis present

## 2018-01-01 LAB — DIFFERENTIAL
BASOS ABS: 0.1 10*3/uL (ref 0–0.1)
BASOS PCT: 1 %
EOS ABS: 0.2 10*3/uL (ref 0–0.7)
EOS PCT: 3 %
Lymphocytes Relative: 24 %
Lymphs Abs: 1.6 10*3/uL (ref 1.0–3.6)
Monocytes Absolute: 0.7 10*3/uL (ref 0.2–1.0)
Monocytes Relative: 10 %
NEUTROS PCT: 62 %
Neutro Abs: 4.3 10*3/uL (ref 1.4–6.5)

## 2018-01-01 LAB — URINE DRUG SCREEN, QUALITATIVE (ARMC ONLY)
AMPHETAMINES, UR SCREEN: NOT DETECTED
BENZODIAZEPINE, UR SCRN: NOT DETECTED
Cannabinoid 50 Ng, Ur ~~LOC~~: POSITIVE — AB
Cocaine Metabolite,Ur ~~LOC~~: NOT DETECTED
MDMA (ECSTASY) UR SCREEN: NOT DETECTED
METHADONE SCREEN, URINE: NOT DETECTED
OPIATE, UR SCREEN: NOT DETECTED
PHENCYCLIDINE (PCP) UR S: NOT DETECTED
Tricyclic, Ur Screen: NOT DETECTED

## 2018-01-01 LAB — APTT: APTT: 28 s (ref 24–36)

## 2018-01-01 LAB — URINALYSIS, ROUTINE W REFLEX MICROSCOPIC
BILIRUBIN URINE: NEGATIVE
Bacteria, UA: NONE SEEN
GLUCOSE, UA: NEGATIVE mg/dL
HGB URINE DIPSTICK: NEGATIVE
KETONES UR: NEGATIVE mg/dL
LEUKOCYTES UA: NEGATIVE
Nitrite: NEGATIVE
PH: 6 (ref 5.0–8.0)
PROTEIN: 30 mg/dL — AB
Specific Gravity, Urine: 1.026 (ref 1.005–1.030)
Squamous Epithelial / LPF: NONE SEEN (ref 0–5)

## 2018-01-01 LAB — CBC
HCT: 44.2 % (ref 40.0–52.0)
Hemoglobin: 15.3 g/dL (ref 13.0–18.0)
MCH: 35.2 pg — AB (ref 26.0–34.0)
MCHC: 34.6 g/dL (ref 32.0–36.0)
MCV: 101.8 fL — ABNORMAL HIGH (ref 80.0–100.0)
PLATELETS: 242 10*3/uL (ref 150–440)
RBC: 4.34 MIL/uL — ABNORMAL LOW (ref 4.40–5.90)
RDW: 14.6 % — AB (ref 11.5–14.5)
WBC: 6.9 10*3/uL (ref 3.8–10.6)

## 2018-01-01 LAB — ETHANOL: Alcohol, Ethyl (B): <10 mg/dL (ref <10)

## 2018-01-01 LAB — COMPREHENSIVE METABOLIC PANEL
ALBUMIN: 3.6 g/dL (ref 3.5–5.0)
ALT: 19 U/L (ref 0–44)
AST: 26 U/L (ref 15–41)
Alkaline Phosphatase: 34 U/L — ABNORMAL LOW (ref 38–126)
Anion gap: 7 (ref 5–15)
BILIRUBIN TOTAL: 0.8 mg/dL (ref 0.3–1.2)
BUN: 19 mg/dL (ref 6–20)
CHLORIDE: 108 mmol/L (ref 98–111)
CO2: 25 mmol/L (ref 22–32)
Calcium: 8.4 mg/dL — ABNORMAL LOW (ref 8.9–10.3)
Creatinine, Ser: 1.36 mg/dL — ABNORMAL HIGH (ref 0.61–1.24)
GFR calc Af Amer: >60 mL/min (ref >60)
GFR calc non Af Amer: 60 mL/min — ABNORMAL LOW (ref >60)
GLUCOSE: 136 mg/dL — AB (ref 70–99)
POTASSIUM: 3.8 mmol/L (ref 3.5–5.1)
Sodium: 140 mmol/L (ref 135–145)
TOTAL PROTEIN: 6.8 g/dL (ref 6.5–8.1)

## 2018-01-01 LAB — GLUCOSE, CAPILLARY
GLUCOSE-CAPILLARY: 127 mg/dL — AB (ref 70–99)
Glucose-Capillary: 139 mg/dL — ABNORMAL HIGH (ref 70–99)
Glucose-Capillary: 123 mg/dL — ABNORMAL HIGH (ref 70–99)
Glucose-Capillary: 161 mg/dL — ABNORMAL HIGH (ref 70–99)

## 2018-01-01 LAB — PROTIME-INR
INR: 1.03
Prothrombin Time: 13.4 seconds (ref 11.4–15.2)

## 2018-01-01 LAB — MRSA PCR SCREENING: MRSA BY PCR: NEGATIVE

## 2018-01-01 LAB — TROPONIN I: TROPONIN I: 0.07 ng/mL — AB (ref <0.03)

## 2018-01-01 MED ORDER — SACUBITRIL-VALSARTAN 49-51 MG PO TABS
1.0000 | ORAL_TABLET | Freq: Two times a day (BID) | ORAL | Status: DC
  Administered 2018-01-02 – 2018-01-04 (×5): 1 via ORAL
  Filled 2018-01-01 (×5): qty 1

## 2018-01-01 MED ORDER — ACETAMINOPHEN 650 MG RE SUPP: 650.0000 mg | RECTAL | Status: DC | PRN

## 2018-01-01 MED ORDER — INSULIN ASPART 100 UNIT/ML ~~LOC~~ SOLN: 0.0000 [IU] | Freq: Every day | SUBCUTANEOUS | Status: DC

## 2018-01-01 MED ORDER — SPIRONOLACTONE 12.5 MG HALF TABLET
12.5000 mg | ORAL_TABLET | Freq: Every day | ORAL | Status: DC
  Administered 2018-01-02 – 2018-01-04 (×3): 12.5 mg via ORAL
  Filled 2018-01-01 (×4): qty 1

## 2018-01-01 MED ORDER — SODIUM CHLORIDE 0.9 % IV SOLN: 50.0000 mL | Freq: Once | INTRAVENOUS | Status: DC

## 2018-01-01 MED ORDER — SODIUM CHLORIDE 0.9 % IV SOLN
50.0000 mL/h | INTRAVENOUS | Status: DC
Start: 2018-01-01 — End: 2018-01-02
  Administered 2018-01-01: 50 mL/h via INTRAVENOUS

## 2018-01-01 MED ORDER — INSULIN ASPART 100 UNIT/ML ~~LOC~~ SOLN: 0.0000 [IU] | Freq: Three times a day (TID) | SUBCUTANEOUS | Status: DC

## 2018-01-01 MED ORDER — NICARDIPINE HCL IN NACL 20-0.86 MG/200ML-% IV SOLN
0.0000 mg/h | INTRAVENOUS | Status: DC | PRN
  Filled 2018-01-01: qty 200

## 2018-01-01 MED ORDER — NICARDIPINE HCL IN NACL 20-0.86 MG/200ML-% IV SOLN
3.0000 mg/h | INTRAVENOUS | Status: DC
  Filled 2018-01-01: qty 200

## 2018-01-01 MED ORDER — ACETAMINOPHEN 160 MG/5ML PO SOLN: 650.0000 mg | ORAL | Status: DC | PRN

## 2018-01-01 MED ORDER — ALTEPLASE (STROKE) FULL DOSE INFUSION
90.0000 mg | Freq: Once | INTRAVENOUS | Status: AC
  Administered 2018-01-01: 90 mg via INTRAVENOUS
  Filled 2018-01-01: qty 100

## 2018-01-01 MED ORDER — LABETALOL HCL 5 MG/ML IV SOLN: 10.0000 mg | Freq: Once | INTRAVENOUS | Status: DC | PRN

## 2018-01-01 MED ORDER — ACETAMINOPHEN 325 MG PO TABS
650.0000 mg | ORAL_TABLET | ORAL | Status: DC | PRN
  Administered 2018-01-02: 650 mg via ORAL
  Filled 2018-01-01: qty 2

## 2018-01-01 MED ORDER — STROKE: EARLY STAGES OF RECOVERY BOOK
Freq: Once | Status: AC
  Administered 2018-01-01: 18:00:00
  Filled 2018-01-01: qty 1

## 2018-01-01 MED ORDER — IOHEXOL 350 MG/ML SOLN
75.0000 mL | Freq: Once | INTRAVENOUS | Status: AC | PRN
  Administered 2018-01-01: 75 mL via INTRAVENOUS

## 2018-01-01 MED ORDER — FUROSEMIDE 40 MG PO TABS
40.0000 mg | ORAL_TABLET | Freq: Every day | ORAL | Status: DC
  Administered 2018-01-02 – 2018-01-04 (×3): 40 mg via ORAL
  Filled 2018-01-01 (×3): qty 1

## 2018-01-01 NOTE — ED Notes (Signed)
Return from CT

## 2018-01-01 NOTE — H&P (Signed)
Neurology H&P  CC: Left-sided weakness  History is obtained from: Patient  HPI: Kent Stanley. is a 47 y.o. male with a history of 2 previous strokes and severe CHF who presents with left-sided weakness that started abruptly this morning.     He was driving back from [place], and was stop and get some breakfast when he noticed that he was not feeling right.  He became concerned that he was not having a stroke and therefore went to [facility] where he was evaluated by tele-neurology who felt that he merited IV TPA and therefore this was started.  In the ED prior to transfer, CTA was performed which demonstrates no large vessel occlusion.  Review of his chart, it looks like his last stroke was in February 2017 he was seen by Thana Farr at that time with mild left-sided weakness and found to have a cerebellar stroke.  He also has a history of severe heart failure with his last echo reading an EF of 15 to 20% and he has an implanted cardiac defibrillator.  This does appear to be an MRI compatible defibrilator.   LKW: 6:30 AM tpa given?:  Yes ICH Score: 0 Modified Rankin Scale: 1-No significant post stroke disability and can perform usual duties with stroke symptoms  ROS: A 14 point ROS was performed and is negative except as noted in the HPI.   Past Medical History:  Diagnosis Date  . Cerebellar Stroke    a. 08/2015 MRI: Acute/subacute L superior cerebellar infarct. Remote R cerebellar infarct;  b. 08/2015 Carotid U/S: normal carotids.  . CHF (congestive heart failure) (HCC)   . Chronic systolic heart failure (HCC)    a. 10/2012 Echo: EF <20%, mod to sev dil LV, diast dysfxn, mildly dil LA/RA, mod to severe MR/TR.  . CKD (chronic kidney disease), stage II   . Coronary artery disease, non-occlusive    a. 09/2014 Cath: minor luminal irregularities with severely dilated left ventricle. LVEDP 30.  [name] H/O noncompliance with medical treatment, presenting hazards to health   .  History of traumatic injury of head   . Hyperlipidemia   . Hypertension   . Morbid obesity (HCC)   . NICM (nonischemic cardiomyopathy) (HCC)    a. 10/2012 Echo: EF <20%.  . Polysubstance abuse (HCC)    a. cocaine, tobacco, THC, ETOH  . Seizure disorder (HCC)   . Stroke (HCC)   . Type II diabetes mellitus (HCC)      Family History  Problem Relation Age of Onset  . Diabetes Mother   . Brain cancer Mother   . Heart attack Mother   . Sleep apnea Mother   . Heart failure Mother   . Hypertension Father   . Diabetes Father   . Heart disease Father   . Hyperlipidemia Father   . Heart attack Father   . Heart failure Father   . Sleep apnea Father   . Sleep apnea Sister   . Sleep apnea Brother   . Heart failure Maternal Aunt      Social History:  reports that he has been smoking cigars.  He has never used smokeless tobacco. He reports that he drinks alcohol. He reports that he does not use drugs.   Exam: Current vital signs: BP 116/71   Pulse 64   Temp 97.8 F (36.6 C) (Oral)   Resp 14   SpO2 93%  Vital signs in last 24 hours: Temp:  [97.8 F (36.6 C)-98.1 F (36.7 C)]  97.8 F (36.6 C) (06/28 1100) Pulse Rate:  [64-85] 64 (06/28 1145) Resp:  [14-26] 14 (06/28 1145) BP: (99-142)/(71-103) 116/71 (06/28 1145) SpO2:  [93 %-98 %] 93 % (06/28 1145) Weight:  [128.4 kg (283 lb)] 128.4 kg (283 lb) (06/28 0825)  Physical Exam  Constitutional: Appears well-developed and well-nourished.  Psych: Affect appropriate to situation Eyes: No scleral injection HENT: No OP obstrucion Head: Normocephalic.  Cardiovascular: Normal rate and regular rhythm.  Respiratory: Effort normal and breath sounds normal to anterior ascultation GI: Soft.  No distension. There is no tenderness.  Skin: WDI  Neuro: Mental Status: Patient is awake, alert, oriented to person, place, month, year, and situation. Patient is able to give a clear and coherent history. No signs of aphasia or  neglect Cranial Nerves: II: Visual Fields are full. Pupils are equal, round, and reactive to light.   III,IV, VI: EOMI without ptosis or diploplia.  V: Facial sensation is symmetric to temperature VII: Facial movement is weak in the left lower face VIII: hearing is intact to voice X: Uvula elevates symmetrically XI: Shoulder shrug is symmetric. XII: tongue is midline without atrophy or fasciculations.  Motor: Tone is normal. Bulk is normal.  He has 4/5 strength in the left arm and leg Sensory: Sensation is mildly diminished in the left Cerebellar: He has mild difficulty with finger-nose-finger and heel-knee-shin consistent with weakness on the left side   I have reviewed labs in epic and the results pertinent to this consultation are: CMP-borderline creatinine, otherwise unremarkable   I have reviewed the images obtained: CT head-negative, CTA head-negative  Primary Diagnosis:  Acute ischemic stroke   Secondary Diagnosis: Chronic systolic (congestive) heart failure, Type 2 diabetes mellitus w/o complications, Obesity and CKD Stage 2 (GFR 60-89)   Impression: 47 year old male with history of severe heart failure and previous strokes who presents with acute ischemic infarct.  Possible that this could be small vessel due to his  Hyperlipidemia, diabetes, smoking but also could be embolic due to his heart failure.  If his EF is still as bad as it was before, may need to consider anticoagulation.  His ICD generator is MRI safe but his leads are not.  Recommendations: 1. HgbA1c, fasting lipid panel 2.  CT head 24 hours after TPA 3. Frequent neuro checks 4. Echocardiogram 5.  SSI for DM 6. Prophylactic therapy-none for 24 hours, then likely consider dual antiplatelet for 3 weeks if not anticoagulating 7. Risk factor modification 8. Telemetry monitoring 9. PT consult, OT consult, Speech consult 10.  Continue home diuretics 11.  Continue home afterload reducers(entresto) 11.  please page stroke NP  Or  PA  Or MD  from 8am -4 pm as this patient will be followed by the stroke team at this point.   You can look them up on [email or web address]      This patient is critically ill and at significant risk of neurological worsening, death and care requires constant monitoring of vital signs, hemodynamics,respiratory and cardiac monitoring, neurological assessment, discussion with family, other specialists and medical decision making of high complexity. I spent 50 minutes of neurocritical care time  in the care of  this patient.  Ritta Slot, MD [facility] [phone]  If 7pm- 7am, please page neurology on call as listed in AMION. 01/01/2018  12:01 PM

## 2018-01-01 NOTE — ED Notes (Signed)
Patient transported to CT 

## 2018-01-01 NOTE — Progress Notes (Signed)
PT Cancellation Note  Patient Details Name: Kent Stanley. MRN: [id number] DOB: August 20, 1970   Cancelled Treatment:    Reason Eval/Treat Not Completed: Active bedrest order; will attempt another day.   Elray Mcgregor 01/01/2018, 3:17 PM  Sheran Lawless, [place] [phone] 01/01/2018

## 2018-01-01 NOTE — Progress Notes (Signed)
CODE STROKE- PHARMACY COMMUNICATION   Time CODE STROKE called/page received: 0825  Time response to CODE STROKE was made (in person or via phone): [place]   Time Stroke Kit retrieved from [place] (only if needed):0915   Name of Provider/Nurse contacted: Curlene Labrum   Past Medical History:  Diagnosis Date  . Cerebellar Stroke    a. 08/2015 MRI: Acute/subacute L superior cerebellar infarct. Remote R cerebellar infarct;  b. 08/2015 Carotid U/S: normal carotids.  . CHF (congestive heart failure) ([place])   . Chronic systolic heart failure ([place])    a. 10/2012 Echo: EF <20%, mod to sev dil LV, diast dysfxn, mildly dil LA/RA, mod to severe MR/TR.  . CKD (chronic kidney disease), stage II   . Coronary artery disease, non-occlusive    a. 09/2014 Cath: minor luminal irregularities with severely dilated left ventricle. LVEDP 30.  [name] H/O noncompliance with medical treatment, presenting hazards to health   . History of traumatic injury of head   . Hyperlipidemia   . Hypertension   . Morbid obesity ([place])   . NICM (nonischemic cardiomyopathy) ([place])    a. 10/2012 Echo: EF <20%.  . Polysubstance abuse ([place])    a. cocaine, tobacco, THC, ETOH  . Seizure disorder ([place])   . Stroke ([place])   . Type II diabetes mellitus ([place])    Prior to Admission medications   Medication Sig Start Date End Date Taking? Authorizing Provider  blood glucose meter kit and supplies KIT Dispense based on patient and insurance preference. Check blood glucose once daily. 08/21/15   Luciana Axe, NP  clopidogrel (PLAVIX) 75 MG tablet Take 1 tablet (75 mg total) by mouth daily. 10/20/17   Alisa Graff, FNP  ENTRESTO 49-51 MG take 1 tablet by mouth twice a day 11/25/16   [name], FNP  furosemide (LASIX) 40 MG tablet Take 1 tablet (40 mg total) by mouth daily. Take 20 mg tablet every evening. 07/08/17   Alisa Graff, FNP  glipiZIDE (GLUCOTROL) 10 MG tablet Take 1 tablet (10 mg total) by mouth daily before breakfast. 04/23/17    Karamalegos, [name], DO  ibuprofen (ADVIL,MOTRIN) 200 MG tablet Take 400-800 mg by mouth daily as needed for moderate pain.    [provider]  Menthol-Methyl Salicylate (MUSCLE RUB) 10-15 % CREA Apply 1 application topically daily.    [provider]  metoprolol succinate (TOPROL-XL) 25 MG 24 hr tablet Take 1 tablet (25 mg total) by mouth daily. 07/09/17   Karamalegos, [name], DO  pravastatin (PRAVACHOL) 40 MG tablet Take 2 tablets (80 mg total) by mouth daily. 11/23/17   Karamalegos, [name], DO  sildenafil (REVATIO) 20 MG tablet Take 5 tablets (100 mg total) by mouth as needed. Take about 30 min prior to sex. Caution stop taking if chest pain. Do not take with Nitro. 11/23/17   Karamalegos, [name], DO  spironolactone (ALDACTONE) 25 MG tablet Take 0.5 tablets (12.5 mg total) by mouth daily. 04/23/17   Karamalegos, [name], DO  trimethoprim-polymyxin b (POLYTRIM) ophthalmic solution Place 1 drop into both eyes every 6 (six) hours. For 5-7 days 12/09/17   Olin Hauser, DO    Charlett Nose ,PharmD Clinical Pharmacist  01/01/2018  9:34 AM

## 2018-01-01 NOTE — ED Notes (Signed)
Report given to tele RN awaiting TeleNeuro

## 2018-01-01 NOTE — ED Provider Notes (Signed)
[facility] Emergency Department Provider Note   ____________________________________________   First MD Initiated Contact with Patient 01/01/18 [phone]     (approximate)  I have reviewed the triage vital signs and the nursing notes.   HISTORY  Chief Complaint Weakness    HPI Rhythm Wigfall. is a 47 y.o. male reports a history of a previous stroke, also multiple TIAs, congestive heart failure and chronic kidney disease  Patient reports last night he was in [place], spent time with his girlfriend and came home today.  He went to [employer] [place] at about 6:30 AM while in the drive-through he noticed that he started feeling very weak seemed a little more weak on his left side and his speech seemed off.  He went home, and the symptoms persisted and he called 911.  On arrival he reports his speech seemed to improve, but he still having a bit of tingling feeling in his left arm and a little bit of weakness feeling in the left arm.  On arrival to the ER, paramedics report his prehospital stroke screen was negative, but he started to note he was having a slight headache on arrival and his speech now seems thickened once again.  Patient reports his speech is not normal at this time.  He does report a very slight weakness in the left arm, and reports yesterday he was "just fine".  He is very clear on the time of onset being at 630 this morning.  EM caveat: Due to acuity and concern for acute neurologic process including possible stroke, initial history is slightly limited the patient's need for emergent imaging studies   Past Medical History:  Diagnosis Date  . Cerebellar Stroke    a. 08/2015 MRI: Acute/subacute L superior cerebellar infarct. Remote R cerebellar infarct;  b. 08/2015 Carotid U/S: normal carotids.  . CHF (congestive heart failure) ([place])   . Chronic systolic heart failure ([place])    a. 10/2012 Echo: EF <20%, mod to sev dil LV, diast dysfxn, mildly dil  LA/RA, mod to severe MR/TR.  . CKD (chronic kidney disease), stage II   . Coronary artery disease, non-occlusive    a. 09/2014 Cath: minor luminal irregularities with severely dilated left ventricle. LVEDP 30.  [name] H/O noncompliance with medical treatment, presenting hazards to health   . History of traumatic injury of head   . Hyperlipidemia   . Hypertension   . Morbid obesity ([place])   . NICM (nonischemic cardiomyopathy) ([place])    a. 10/2012 Echo: EF <20%.  . Polysubstance abuse ([place])    a. cocaine, tobacco, THC, ETOH  . Seizure disorder ([place])   . Stroke ([place])   . Type II diabetes mellitus [facility])     Patient Active Problem List   Diagnosis Date Noted  . Vertigo due to previous cerebellar infarction 06/10/2016  . CKD (chronic kidney disease), stage III ([place]) 05/15/2016  . Diabetes mellitus type 2, controlled, with complications ([place]) 50/93/2671  . Morbid obesity ([place])   . Polysubstance abuse ([place])   . Seizure disorder ([place])   . NICM (nonischemic cardiomyopathy) ([place])   . Epididymitis 12/25/2014  . Musculoskeletal pain 10/31/2014  . Erectile dysfunction 10/31/2014  . OSA (obstructive sleep apnea) 11/05/2013  . Chronic systolic heart failure ([place])   . Hyperlipidemia   . Hypertension   . HLD (hyperlipidemia) 01/27/2013  . History of CVA (cerebrovascular accident) 01/27/2013    Past Surgical History:  Procedure Laterality Date  . CARDIAC CATHETERIZATION  [place]  . ICD IMPLANT N/A 12/08/2016   Procedure: ICD Implant;  Surgeon: Deboraha Sprang, MD;  Location: [facility];  Service: Cardiovascular;  Laterality: N/A;    Prior to Admission medications   Medication Sig Start Date End Date Taking? Authorizing Provider  blood glucose meter kit and supplies KIT Dispense based on patient and insurance preference. Check blood glucose once daily. 08/21/15   Luciana Axe, NP  clopidogrel (PLAVIX) 75 MG tablet Take 1 tablet (75 mg total) by mouth daily. 10/20/17   Alisa Graff,  FNP  ENTRESTO 49-51 MG take 1 tablet by mouth twice a day 11/25/16   [name], FNP  furosemide (LASIX) 40 MG tablet Take 1 tablet (40 mg total) by mouth daily. Take 20 mg tablet every evening. 07/08/17   Alisa Graff, FNP  glipiZIDE (GLUCOTROL) 10 MG tablet Take 1 tablet (10 mg total) by mouth daily before breakfast. 04/23/17   Karamalegos, [name], DO  ibuprofen (ADVIL,MOTRIN) 200 MG tablet Take 400-800 mg by mouth daily as needed for moderate pain.    [provider]  Menthol-Methyl Salicylate (MUSCLE RUB) 10-15 % CREA Apply 1 application topically daily.    [provider]  metoprolol succinate (TOPROL-XL) 25 MG 24 hr tablet Take 1 tablet (25 mg total) by mouth daily. 07/09/17   Karamalegos, [name], DO  pravastatin (PRAVACHOL) 40 MG tablet Take 2 tablets (80 mg total) by mouth daily. 11/23/17   Karamalegos, [name], DO  sildenafil (REVATIO) 20 MG tablet Take 5 tablets (100 mg total) by mouth as needed. Take about 30 min prior to sex. Caution stop taking if chest pain. Do not take with Nitro. 11/23/17   Karamalegos, [name], DO  spironolactone (ALDACTONE) 25 MG tablet Take 0.5 tablets (12.5 mg total) by mouth daily. 04/23/17   Karamalegos, [name], DO  trimethoprim-polymyxin b (POLYTRIM) ophthalmic solution Place 1 drop into both eyes every 6 (six) hours. For 5-7 days 12/09/17   Olin Hauser, DO    Allergies Tomato  Family History  Problem Relation Age of Onset  . Diabetes Mother   . Brain cancer Mother   . Heart attack Mother   . Sleep apnea Mother   . Heart failure Mother   . Hypertension Father   . Diabetes Father   . Heart disease Father   . Hyperlipidemia Father   . Heart attack Father   . Heart failure Father   . Sleep apnea Father   . Sleep apnea Sister   . Sleep apnea Brother   . Heart failure Maternal Aunt     Social History Social History   Tobacco Use  . Smoking status: Current Some Day Smoker    Types: Cigars    . Smokeless tobacco: Never Used  . Tobacco comment: smokes ~10 black and milds per week  Substance Use Topics  . Alcohol use: Yes    Comment: 8-9 beers a month per pt  . Drug use: No    Review of Systems Constitutional: No fever/chills Eyes: No visual changes. ENT: No sore throat. Cardiovascular: Denies chest pain. Respiratory: Denies shortness of breath. Gastrointestinal: No abdominal pain.  No nausea, no vomiting.  No diarrhea.  No constipation. Genitourinary: Negative for dysuria. Musculoskeletal: Negative for back pain. Skin: Negative for rash. Neurological: Negative for severe headache.  He reports a mild slight feeling of a headache, somewhat hard to locate.  No nausea.  Does report he feels slightly weak in the  left arm and a little bit of tingling in the left arm, and his speech seems a little bit off.    ____________________________________________   PHYSICAL EXAM:  VITAL SIGNS: ED Triage Vitals [01/01/18 0825]  Enc Vitals Group     BP (!) 123/91     Pulse Rate 85     Resp      Temp 97.8 F (36.6 C)     Temp Source Oral     SpO2 96 %     Weight 283 lb (128.4 kg)     Height 6' (1.829 m)     Head Circumference      Peak Flow      Pain Score 0     Pain Loc      Pain Edu?      Excl. in [place]?     Constitutional: Alert and oriented. Well appearing and in no acute distress. Eyes: Conjunctivae are normal. Head: Atraumatic. Nose: No congestion/rhinnorhea. Mouth/Throat: Mucous membranes are moist. Neck: No stridor.   Cardiovascular: Normal rate, regular rhythm. Grossly normal heart sounds.  Good peripheral circulation. Respiratory: Normal respiratory effort.  No retractions. Lungs CTAB. Gastrointestinal: Soft and nontender. No distention. Musculoskeletal: No lower extremity tenderness nor edema. Neurologic:  NIH score equals 3, performed by me at bedside. The patient has no pronator drift except for a very minimal amount of drift in the left arm. The  patient has normal cranial nerve exam. Extraocular movements are normal. Visual fields are normal. Patient has 5 out of 5 strength in all extremities. There is no numbness or gross, acute sensory abnormality in the extremities bilaterally except he reports a slight decrease of a tingling feeling of slight numbing over the left arm. There is a very slight dysarthria disturbance. No dysarthria. No aphasia. No ataxia. Normal finger nose finger bilat. Patient speaking in full and clear sentences.  VAN scale negative.  Skin:  Skin is warm, dry and intact. No rash noted. Psychiatric: Mood and affect are normal. Speech and behavior are normal.  ____________________________________________   LABS (all labs ordered are listed, but only abnormal results are displayed)  Labs Reviewed  CBC - Abnormal; Notable for the following components:      Result Value   RBC 4.34 (*)    MCV 101.8 (*)    MCH 35.2 (*)    RDW 14.6 (*)    All other components within normal limits  COMPREHENSIVE METABOLIC PANEL - Abnormal; Notable for the following components:   Glucose, Bld 136 (*)    Creatinine, Ser 1.36 (*)    Calcium 8.4 (*)    Alkaline Phosphatase 34 (*)    GFR calc non Af Amer 60 (*)    All other components within normal limits  TROPONIN I - Abnormal; Notable for the following components:   Troponin I 0.07 (*)    All other components within normal limits  GLUCOSE, CAPILLARY - Abnormal; Notable for the following components:   Glucose-Capillary 139 (*)    All other components within normal limits  ETHANOL  PROTIME-INR  APTT  DIFFERENTIAL  URINE DRUG SCREEN, QUALITATIVE ([facility] ONLY)  URINALYSIS, ROUTINE W REFLEX MICROSCOPIC   ____________________________________________  EKG  Reviewed and entered by me at 8:30 AM Heart rate 80 QRS 100 QTc 480 Normal sinus rhythm, mild prolongation of the QT, mild T wave abnormality noted in aVL V5 and V6, no clear evidence of  ischemia. ____________________________________________  RADIOLOGY    IMPRESSION: 1. No acute intracranial abnormality. 2. Mild  white matter changes are advanced for age. 3. Expected evolution of left superior cerebellar infarct noted on previous MRI. 4. ASPECTS is 10/10  These results were called by telephone at the time of interpretation on 01/01/2018 at 8:46 am to Dr. Delman Kitten , who verbally acknowledged these results. ____________________________________________   PROCEDURES  Procedure(s) performed: None  Procedures  Critical Care performed: Yes, see critical care note(s)  CRITICAL CARE Performed by: Delman Kitten   Total critical care time: 75 minutes  Critical care time was exclusive of separately billable procedures and treating other patients.  Critical care was necessary to treat or prevent imminent or life-threatening deterioration.  Critical care was time spent personally by me on the following activities: development of treatment plan with patient and/or surrogate as well as nursing, discussions with consultants, evaluation of patient's response to treatment, examination of patient, obtaining history from patient or surrogate, ordering and performing treatments and interventions, ordering and review of laboratory studies, ordering and review of radiographic studies, pulse oximetry and re-evaluation of patient's condition.  Patient required additional critical care time due to his need for TPA, further treatment in the ER, CT angiography, arranging transport to the nearest comprehensive stroke center/neuro ICU.  ____________________________________________   INITIAL IMPRESSION / [place] / ED COURSE  Pertinent labs & imaging results that were available during my care of the patient were reviewed by me and considered in my medical decision making (see chart for details).  Patient with history of previous stroke, now presenting with difficulty with  his walking, feeling light headache, slight weakness in the left arm with associated mild dysarthria.  His NIH score on my exam is 3, indicative of possible small acute neurologic process.  We will proceed with acute code stroke as we evaluate and also assess for other causes.  Denies cardiac or pulmonary symptoms.  No infectious symptoms.  Stable hemodynamics.  I feel the patient is at notably elevated risk for potential recurrent stroke, rule out hemorrhagic stroke etc. at this time.  Clinical Course as of Jan 01 934  Fri Jan 01, 2018  0828 Seen and evaluated the patient initiated code stroke based on symptomatology.   [MQ]  [phone] Teleneurologist interviewing patient.    [MQ]  [phone] Case and care discussed with Dr. Lucia Gaskins of neurology.  He is recommending the patient received TPA for concerns of ongoing ischemic stroke.  Risks discussed with the patient, the patient is agreeable with plan for TPA, TPA being ordered by neurologist.  I have called and placed a request for transport to [facility] intensive [facility] at [facility] at this time.  Patient agreeable and understanding and consenting to transport as well, risks and benefits explained.  Patient remains with dysarthria, some weakness involving the left upper extremity.    [MQ]    Clinical Course User Index [MQ] Delman Kitten, MD   ----------------------------------------- 8:51 AM on 01/01/2018 -----------------------------------------  Nurse recorded NIH equals 2, spoke with tele-neurologist who is now walking into see and evaluate the patient for stroke consultation.  Discussed case with radiology, the report negative for evidence of acute hemorrhage on CT.  ----------------------------------------- 9:23 AM on 01/01/2018 -----------------------------------------  Dr. Lucia Gaskins, neurology discussed case with me and advises no evidence of large vessel occlusion but suspects likely a small ischemic stroke which the patient will be receiving  TPA for at this time.  Continue to monitor the patient closely.  Anticipate transfer to [facility] neuro ICU.  ----------------------------------------- 9:32 AM on 01/01/2018 -----------------------------------------  Patient accepted in transfer to [facility] neuro ICU by Dr. Kathrynn Speed.  He does recommend proceeding with CT angiogram prior to transport, [facility] is currently in route to pick up patient, and anticipate transfer once patient completes CT angiogram to rule out large vessel occlusion, though clinically I suspect this risk is low.  Dr. Leonel Ramsay reports time saving in transfer is outweighed by the benefits of having CT angiogram imaging done and available prior to arrival at the neuro stroke center at this time.   ____________________________________________   FINAL CLINICAL IMPRESSION(S) / ED DIAGNOSES  Final diagnoses:  Cerebrovascular accident (CVA), unspecified mechanism ([place])      NEW MEDICATIONS STARTED DURING THIS VISIT:  New Prescriptions   No medications on file     Note:  This document was prepared using Dragon voice recognition software and may include unintentional dictation errors.     Delman Kitten, MD 01/01/18 [phone]

## 2018-01-01 NOTE — ED Notes (Signed)
Report was given to Josh RN at [facility] for 4N Rm 23. This RN called Josh at [phone]. Josh aware of pt transfer

## 2018-01-01 NOTE — ED Triage Notes (Signed)
Pt presents to the ed via [facility] from [place] as pt stated he feels different. Pt has a hx of TIA, pt states the pt is off balance. Pt states he had a glass of wine last night and drove home from [place] where GF lives and felt different.

## 2018-01-01 NOTE — ED Notes (Signed)
emtala reviewed by this RN 

## 2018-01-01 NOTE — ED Notes (Signed)
[facility] at bedside at this time.  

## 2018-01-01 NOTE — Consult Note (Signed)
[facility] TeleNeurology Consult Services  Impression:  1.  Acute Stroke , right parietal most likely with visual field defect left-sided symptoms. 2. Old left cerebellar infarction  Symptoms not consistent with LVO therefore no indication for advanced imaging or thrombectomy  Differential Diagnosis:  1. Cardioembolic stroke  2. Small vessel disease/lacune  3. Thromboembolic, artery-to-artery mechanism  4. Hypercoagulable state-related infarct  5. Transient ischemic attack  6. Thrombotic mechanism, large artery disease   Comments:   Door time:  0818 [facility] contacted: [phone] [facility] at bedside: 0843 NIHSS assessment time: 0908 Verbal tpa order: [id number] Needle time: 0923  Verbal Consent to tPA:  I have explained to the patient/family/guardian the nature of the patient's condition, the use of tPA fibrinolytic agent, and the benefits to be reasonably expected compared with alternative approaches. I have discussed the likelihood of major risks or complications of this procedure including (if applicable) but not limited to loss of limb function, brain damage, paralysis, hemorrhage, infection, complications from transfusion of blood components, drug reactions, blood clots and loss of life. I have also indicated that with any procedure there is always the possibility of an unexpected complication. I have explained the risks which include:    1. Death, Stroke or permanent neurologic injury (paralysis, coma, etc)   2. Worsening of stroke symptoms from swelling or bleeding in the brain   3. Bleeding in other parts of the body   4. Need for blood transfusions to replace blood or clotting factors   5. Allergic reaction to medications   6. Other unexpected complications    All questions were answered and the patient/family/guardian express understanding of the treatment plan and consent to the procedure.  Our recommendations are outlined below.   We will be seeing the  patient back in follow up as noted.    Recommendations:   IV tPA - dose = 90 mg total, 9 mg bolus, 81 mg infusion Routine post tPA monitoring including neuro checks and blood pressure control during/after treatment Monitor blood pressure Check blood pressure and NIHSS every 15 min for 2 h, then every 30 min for 6 h, and finally every hour for 16 h  Systolic greater than 180 OR diastolic greater than 105: Option 1: Labetalol 10 mg IV for 1 - 2 min May repeat or double labetalol every 10 min to maximum dose of 300 mg, or give initial labetalol dose, then start labetalol drip at 2 - 8 mg/min. Option 2: Nicardipine 5 mg/h IV infusion as initial dose and titrate to desired effect by increasing 2.5 mg/h every 5 min to maximum of 15 mg/h;  If blood pressure is not controlled by labetolol or nicardipine, consider sodium nitroprusside.  Admission to ICU CT brain 24 hours post tPA NPO until swallowing screen performed and passed No antiplatelet agents or anticoagulants (including heparin for DVT prophylaxis) in first 24 hours No Foley catheter, nasogastric tube, arterial catheter or central venous catheter for 24 hr, unless absolutely necessary Telemetry  Inpatient Neurology Consultation Stroke evaluation as per inpatient neurology recommendations Discussed with ED MD   ------------------------------------------------------------------------------  CC: slurred speech left-sided weakness  History of Present Illness:   Patient is a 47 year old man who 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 At 0630 he made a phone call me and slurred speech. He got out of his vehicle and had trouble walking because the weakness in the left side and loss of balance. He had a cerebellar stroke in 2017. He has had TIAs  before that. He has a history of hypertension his blood pressure was 139/101. He is diabetic and is blood sugar was 117. He claims of had an MRI but they said his vessel's board open.  He has had congestive heart failure chronically. He has an implanted defibrillator which has never gone off. He is not sure if he had atrial fibrillation in the past. He is not on any type of blood thinner either antiplatelet or anticoagulant. He has hyperlipidemia. He has obstructive sleep apnea. He smokes three cigarettes per day.  Diagnostics: I reviewed his CT. There is an old infarction in the deep left cerebellar hemisphere. There is ischemic white matter changes but no evidence of acute stroke, hemorrhage, or mass lesion.  Exam  Level of consciousness: alert, keenly responsive = 0 Level of consciousness: month and age: both questions correct = 0 Level of consciousness: commands: performs both tasks = 0 Horizontal EOMs: normal = 0 Visual fields: normal = 1 left upper quadrant Test facial palsy: normal symmetry = 0 L arm motor drift: no drift for 10 seconds = 0 R arm motor drift: no drift for 10 seconds = 0 L leg motor drift: no drift for five seconds = 0 R leg motor drift: no drift five seconds = 0 Limb ataxia: (FNF/Heel-Shin): no ataxia = 1 left leg Sensation: normal; no sensory loss = 1 left side  Language/Aphasia: normal: no aphasia = 1 Dysarthria: normal = 0 Extinction/Inattention = 0  NIHSS score: = 4  Medical Decision Making:  - Extensive number of diagnosis or management options are considered above.   - Extensive amount of complex data reviewed.   - High risk of complication and/or morbidity or mortality are associated with differential diagnostic considerations above.  - There may be Uncertain outcome and increased probability of prolonged functional impairment or high probability of severe prolonged functional impairment associated with some of these differential diagnosis.  Medical Data Reviewed:  1.Data reviewed include clinical labs, radiology,  Medical Tests;   2.Tests results discussed w/performing or interpreting physician;   3.Obtaining/reviewing old medical  records;  4.Obtaining case history from another source;  5.Independent review of image, tracing or specimen.     Patient was informed the Neurology Consult would happen via TeleHealth consult by way of interactive audio and video telecommunications and consented to receiving care in this manner.

## 2018-01-01 NOTE — ED Notes (Signed)
EDP aware of critical Trop of 0.07

## 2018-01-02 ENCOUNTER — Inpatient Hospital Stay (HOSPITAL_COMMUNITY): Payer: Medicaid Other

## 2018-01-02 DIAGNOSIS — I5030 Unspecified diastolic (congestive) heart failure: Secondary | ICD-10-CM

## 2018-01-02 DIAGNOSIS — E1159 Type 2 diabetes mellitus with other circulatory complications: Secondary | ICD-10-CM

## 2018-01-02 DIAGNOSIS — I255 Ischemic cardiomyopathy: Secondary | ICD-10-CM

## 2018-01-02 LAB — BASIC METABOLIC PANEL
Anion gap: 7 (ref 5–15)
BUN: 16 mg/dL (ref 6–20)
CALCIUM: 8.4 mg/dL — AB (ref 8.9–10.3)
CO2: 25 mmol/L (ref 22–32)
CREATININE: 1.45 mg/dL — AB (ref 0.61–1.24)
Chloride: 107 mmol/L (ref 98–111)
GFR calc Af Amer: >60 mL/min (ref >60)
GFR calc non Af Amer: 55 mL/min — ABNORMAL LOW (ref >60)
GLUCOSE: 108 mg/dL — AB (ref 70–99)
Potassium: 3.7 mmol/L (ref 3.5–5.1)
Sodium: 139 mmol/L (ref 135–145)

## 2018-01-02 LAB — GLUCOSE, CAPILLARY
GLUCOSE-CAPILLARY: 103 mg/dL — AB (ref 70–99)
Glucose-Capillary: 125 mg/dL — ABNORMAL HIGH (ref 70–99)
Glucose-Capillary: 98 mg/dL (ref 70–99)
Glucose-Capillary: 151 mg/dL — ABNORMAL HIGH (ref 70–99)

## 2018-01-02 LAB — LIPID PANEL
CHOL/HDL RATIO: 4.9 ratio
CHOLESTEROL: 163 mg/dL (ref 0–200)
HDL: 33 mg/dL — ABNORMAL LOW (ref >40)
LDL Cholesterol: 109 mg/dL — ABNORMAL HIGH (ref 0–99)
Triglycerides: 103 mg/dL (ref <150)
VLDL: 21 mg/dL (ref 0–40)

## 2018-01-02 LAB — ECHOCARDIOGRAM COMPLETE
HEIGHTINCHES: 72 in
Weight: 4800.74 oz

## 2018-01-02 LAB — CBC
HEMATOCRIT: 42.8 % (ref 39.0–52.0)
Hemoglobin: 14.2 g/dL (ref 13.0–17.0)
MCH: 33.9 pg (ref 26.0–34.0)
MCHC: 33.2 g/dL (ref 30.0–36.0)
MCV: 102.1 fL — AB (ref 78.0–100.0)
Platelets: 192 10*3/uL (ref 150–400)
RBC: 4.19 MIL/uL — ABNORMAL LOW (ref 4.22–5.81)
RDW: 13.2 % (ref 11.5–15.5)
WBC: 6.9 10*3/uL (ref 4.0–10.5)

## 2018-01-02 LAB — HEMOGLOBIN A1C
Hgb A1c MFr Bld: 6.4 % — ABNORMAL HIGH (ref 4.8–5.6)
Mean Plasma Glucose: 136.98 mg/dL

## 2018-01-02 LAB — VITAMIN B12: VITAMIN B 12: 183 pg/mL (ref 180–914)

## 2018-01-02 LAB — TSH: TSH: 2.696 u[IU]/mL (ref 0.350–4.500)

## 2018-01-02 LAB — HIV ANTIBODY (ROUTINE TESTING W REFLEX): HIV Screen 4th Generation wRfx: NONREACTIVE

## 2018-01-02 MED ORDER — ATORVASTATIN CALCIUM 40 MG PO TABS
40.0000 mg | ORAL_TABLET | Freq: Every day | ORAL | Status: DC
  Administered 2018-01-02 – 2018-01-03 (×2): 40 mg via ORAL
  Filled 2018-01-02 (×2): qty 1

## 2018-01-02 MED ORDER — ASPIRIN EC 325 MG PO TBEC
325.0000 mg | DELAYED_RELEASE_TABLET | Freq: Every day | ORAL | Status: DC
  Administered 2018-01-02 – 2018-01-03 (×2): 325 mg via ORAL
  Filled 2018-01-02 (×2): qty 1

## 2018-01-02 MED ORDER — METOPROLOL SUCCINATE ER 25 MG PO TB24
25.0000 mg | ORAL_TABLET | Freq: Every day | ORAL | Status: DC
  Administered 2018-01-02 – 2018-01-04 (×3): 25 mg via ORAL
  Filled 2018-01-02 (×3): qty 1

## 2018-01-02 MED ORDER — GLIPIZIDE 5 MG PO TABS
10.0000 mg | ORAL_TABLET | Freq: Every day | ORAL | Status: DC
  Administered 2018-01-03: 10 mg via ORAL
  Filled 2018-01-02: qty 2

## 2018-01-02 MED ORDER — LABETALOL HCL 5 MG/ML IV SOLN: 10.0000 mg | INTRAVENOUS | Status: DC | PRN

## 2018-01-02 NOTE — Evaluation (Signed)
Speech Language Pathology Evaluation Patient Details Name: Kent Stanley. MRN: [id number] DOB: 12/02/1970 Today's Date: 01/02/2018 Time: 2595-6387 SLP Time Calculation (min) (ACUTE ONLY): 20 min  Problem List:  Patient Active Problem List   Diagnosis Date Noted  . Stroke (cerebrum) (HCC) 01/01/2018  . Vertigo due to previous cerebellar infarction 06/10/2016  . CKD (chronic kidney disease), stage III (HCC) 05/15/2016  . Diabetes mellitus type 2, controlled, with complications (HCC) 04/18/2016  . Morbid obesity (HCC)   . Polysubstance abuse (HCC)   . Seizure disorder (HCC)   . NICM (nonischemic cardiomyopathy) (HCC)   . Epididymitis 12/25/2014  . Musculoskeletal pain 10/31/2014  . Erectile dysfunction 10/31/2014  . OSA (obstructive sleep apnea) 11/05/2013  . Chronic systolic heart failure (HCC)   . Hyperlipidemia   . Hypertension   . HLD (hyperlipidemia) 01/27/2013  . History of CVA (cerebrovascular accident) 01/27/2013   Past Medical History:  Past Medical History:  Diagnosis Date  . Cerebellar Stroke    a. 08/2015 MRI: Acute/subacute L superior cerebellar infarct. Remote R cerebellar infarct;  b. 08/2015 Carotid U/S: normal carotids.  . CHF (congestive heart failure) (HCC)   . Chronic systolic heart failure (HCC)    a. 10/2012 Echo: EF <20%, mod to sev dil LV, diast dysfxn, mildly dil LA/RA, mod to severe MR/TR.  . CKD (chronic kidney disease), stage II   . Coronary artery disease, non-occlusive    a. 09/2014 Cath: minor luminal irregularities with severely dilated left ventricle. LVEDP 30.  [name] H/O noncompliance with medical treatment, presenting hazards to health   . History of traumatic injury of head   . Hyperlipidemia   . Hypertension   . Morbid obesity (HCC)   . NICM (nonischemic cardiomyopathy) (HCC)    a. 10/2012 Echo: EF <20%.  . Polysubstance abuse (HCC)    a. cocaine, tobacco, THC, ETOH  . Seizure disorder (HCC)   . Stroke (HCC)   . Type II diabetes mellitus  (HCC)    Past Surgical History:  Past Surgical History:  Procedure Laterality Date  . CARDIAC CATHETERIZATION     [facility]  . ICD IMPLANT N/A 12/08/2016   Procedure: ICD Implant;  Surgeon: Duke Salvia, MD;  Location: [facility] INVASIVE CV LAB;  Service: Cardiovascular;  Laterality: N/A;   HPI:  47 y.o. male with a history of 2 previous strokes and severe CHF who presents with left-sided weakness, received IV TPA. CT 01/02/18 shows small right superior cerebellar infarct has become apparent since yesterday, remote left superior cerebellar infarct.   Assessment / Plan / Recommendation Clinical Impression   Patient presents with mild cognitive-linguistic impairments in memory and language. He scored 23/30 on MOCA-Basic (>26 is normal), losing points on delayed recall (1/5) and fluency, confrontation naming. Minimal dysarthria appreciated; pt intelligible in conversation and reports speech is "90%" of normal. Pt reports he had short-term memory problems after his prior stroke but they had improved. He prefers to "get therapy at home" with his girlfriend who is an Char[employer] fundraiser and declines f/u with OP SLP. SLP educated re: strategies to improve functional recall including use of calendars, cell phone alerts and timers, supervision at least initially with tasks such as cooking, medication management, bill-paying. Encouraged pt to consider f/u with OP SLP should he note functional deficits which persist after d/c. Pt in agreement. No further skilled ST needs identified; SLP will s/o.    SLP Assessment  SLP Recommendation/Assessment: Patient does not need any further Speech [facility] Pathology Services SLP Visit  Diagnosis: Cognitive communication deficit (R41.841);Dysarthria and anarthria (R47.1)    Follow Up Recommendations  Other (comment)(pt declines follow-up; SLP encouraged follow-up in OP SLP) , Intermittent supervision    Frequency and Duration min 1 x/week  1 week      SLP Evaluation Cognition  Overall  Cognitive Status: Impaired/Different from baseline Arousal/Alertness: Awake/alert Orientation Level: Oriented X4 Attention: Focused;Sustained;Selective;Alternating Focused Attention: Appears intact Sustained Attention: Appears intact Selective Attention: Appears intact Alternating Attention: Appears intact Memory: Impaired Memory Impairment: Decreased short term memory;Decreased recall of new information Decreased Short Term Memory: Verbal basic Awareness: Appears intact Problem Solving: Appears intact Executive Function: Reasoning;Organizing Reasoning: Appears intact Organizing: Appears intact Safety/Judgment: Appears intact       Comprehension  Auditory Comprehension Overall Auditory Comprehension: Appears within functional limits for tasks assessed Yes/No Questions: Within Functional Limits Commands: Within Functional Limits Conversation: Complex Visual Recognition/Discrimination Discrimination: Not tested Reading Comprehension Reading Status: Not tested    Expression Expression Primary Mode of Expression: Verbal Verbal Expression Overall Verbal Expression: Impaired Initiation: No impairment Automatic Speech: Name;Social Response Level of Generative/Spontaneous Verbalization: Conversation Repetition: No impairment Naming: Impairment Confrontation: Impaired(3/4) Divergent: 75-100% accurate Pragmatics: No impairment Written Expression Dominant Hand: Right Written Expression: Not tested   Oral / Motor  Oral Motor/Sensory Function Overall Oral Motor/Sensory Function: Within functional limits Motor Speech Overall Motor Speech: Impaired Respiration: Within functional limits Phonation: Normal Resonance: Within functional limits Articulation: Impaired(pt reports speech is "90%" of what it was before) Level of Impairment: Conversation(>99% intelligible) Intelligibility: Intelligible Motor Planning: Witnin functional limits   GO             Rondel Baton, MS,  CCC-SLP Speech-Language Pathologist [phone]         Arlana Lindau 01/02/2018, 10:16 AM

## 2018-01-02 NOTE — Progress Notes (Signed)
  Echocardiogram 2D Echocardiogram has been performed.  Sue Fernicola T Wilton Thrall 01/02/2018, 11:45 AM

## 2018-01-02 NOTE — Progress Notes (Signed)
  Spoke with Sharrell Ku MD EP Cardiology. He looked up [facility] device which was found to be MRI compatible ; however, the lead is from [employer] and there is no way to know if the lead is MRI compatible. He will speak to Dr Graciela Husbands who placed the device. The defibrillator only has a V lead but interrogation may still be able to discern afib. through an algorithm. He will have it interrogated tomorrow but feels the yield will be low.  Delton See PA-C [facility] Pager [phone] 01/02/2018, 12:46 PM

## 2018-01-02 NOTE — Progress Notes (Signed)
STROKE TEAM PROGRESS NOTE   SUBJECTIVE (INTERVAL HISTORY) His RN is at the bedside.  Pt is sitting in chair. He stated that yesterday he was on the phone with his girlfriend and found to have dysarthria. On getting out of car, he started to have dizziness and off balance. He received tPA. Symptoms improved today, no more dizziness, but still has mild dysarthria. Old left mild ataxia but no right ataxia.     OBJECTIVE Temp:  [97.5 F (36.4 C)-98.7 F (37.1 C)] 97.5 F (36.4 C) (06/29 0800) Pulse Rate:  [53-93] 85 (06/29 0800) Cardiac Rhythm: Normal sinus rhythm (06/29 0900) Resp:  [1-31] 14 (06/29 0900) BP: (85-145)/(49-111) 139/92 (06/29 0900) SpO2:  [93 %-99 %] 93 % (06/29 0900)  CBC:  Recent Labs  Lab 01/01/18 0829 01/02/18 0301  WBC 6.9 6.9  NEUTROABS 4.3  --   HGB 15.3 14.2  HCT 44.2 42.8  MCV 101.8* 102.1*  PLT 242 192    Basic Metabolic Panel:  Recent Labs  Lab 01/01/18 0829 01/02/18 0301  NA 140 139  K 3.8 3.7  CL 108 107  CO2 25 25  GLUCOSE 136* 108*  BUN 19 16  CREATININE 1.36* 1.45*  CALCIUM 8.4* 8.4*    Lipid Panel:     Component Value Date/Time   CHOL 163 01/02/2018 0301   CHOL 183 10/14/2012 0354   TRIG 103 01/02/2018 0301   TRIG 79 10/14/2012 0354   HDL 33 (L) 01/02/2018 0301   HDL 44 10/14/2012 0354   CHOLHDL 4.9 01/02/2018 0301   VLDL 21 01/02/2018 0301   VLDL 16 10/14/2012 0354   LDLCALC 109 (H) 01/02/2018 0301   LDLCALC 123 (H) 10/14/2012 0354   HgbA1c:  Lab Results  Component Value Date   HGBA1C 6.4 (H) 01/02/2018   Urine Drug Screen:     Component Value Date/Time   LABOPIA NONE DETECTED 01/01/2018 0829   COCAINSCRNUR NONE DETECTED 01/01/2018 0829   LABBENZ NONE DETECTED 01/01/2018 0829   AMPHETMU NONE DETECTED 01/01/2018 0829   THCU POSITIVE (A) 01/01/2018 0829   LABBARB (A) 01/01/2018 0829    Result not available. Reagent lot number recalled by manufacturer.    Alcohol Level     Component Value Date/Time   ETH <10  01/01/2018 0829    IMAGING I have personally reviewed the radiological images below and agree with the radiology interpretations.  Ct Angio Head W Or Wo Contrast  Result Date: 01/01/2018 CLINICAL DATA:  Acute presentation with weakness. History of previous stroke. Weakness worse on the left. Some speech disturbance. EXAM: CT ANGIOGRAPHY HEAD AND NECK TECHNIQUE: Multidetector CT imaging of the head and neck was performed using the standard protocol during bolus administration of intravenous contrast. Multiplanar CT image reconstructions and MIPs were obtained to evaluate the vascular anatomy. Carotid stenosis measurements (when applicable) are obtained utilizing NASCET criteria, using the distal internal carotid diameter as the denominator. CONTRAST:  75mL OMNIPAQUE IOHEXOL 350 MG/ML SOLN COMPARISON:  CT same day.  MRI 08/13/2015. FINDINGS: CTA NECK FINDINGS Aortic arch: Normal Right carotid system: Common carotid artery is widely patent to the bifurcation. Carotid bifurcation appears normal, though there is some motion degradation. Cervical ICA is widely patent. Left carotid system: Common carotid artery is widely patent to the bifurcation. Carotid bifurcation appears normal. Cervical ICA is widely patent. Vertebral arteries: Both vertebral artery origins appear normal. The right vertebral artery slightly larger than the left. Both vertebral arteries appear normal through the cervical region to the foramen  magnum. Skeleton: Normal Other neck: No soft tissue mass or lymphadenopathy. Upper chest: Normal Review of the MIP images confirms the above findings CTA HEAD FINDINGS Anterior circulation: Both internal carotid arteries are tortuous beneath the skull base but widely patent through the skull base and siphon regions. The anterior and middle cerebral vessels are normal without proximal stenosis, aneurysm or vascular malformation. No missing vessels are identified. Posterior circulation: Both vertebral  arteries are patent at the foramen magnum. The small right vertebral artery terminates in PICA. The left vertebral artery supplies the basilar which is a small but patent vessel. Superior cerebellar and posterior cerebral arteries are patent, with the posterior cerebral arteries receiving most of there supply from the anterior circulation. Venous sinuses: Patent and normal. Anatomic variants: None significant. Delayed phase: No abnormal enhancement. Old left superior cerebellar stroke as previously shown. Review of the MIP images confirms the above findings IMPRESSION: Negative CT angiography. No evidence of stenotic disease or large/medium vessel occlusion. Electronically Signed   By: Paulina Fusi M.D.   On: 01/01/2018 10:01   Ct Head Wo Contrast  Result Date: 01/02/2018 CLINICAL DATA:  24 hours stroke follow-up.  Status post tPA EXAM: CT HEAD WITHOUT CONTRAST TECHNIQUE: Contiguous axial images were obtained from the base of the skull through the vertex without intravenous contrast. COMPARISON:  Head CT and CTA from yesterday FINDINGS: Brain: Small low-density in the paramedian right upper cerebellum not seen yesterday. Remote left superior cerebellar infarct. No hemorrhage, hydrocephalus, or masslike finding Vascular: No hyperdense vessel or unexpected calcification. Skull: Negative Sinuses/Orbits: Chronic proptosis from increased retro-orbital fat. IMPRESSION: 1. A small right superior cerebellar infarct has become apparent since yesterday. 2. Negative for hemorrhage post tPA. 3. Remote left superior cerebellar infarct. Electronically Signed   By: Marnee Spring M.D.   On: 01/02/2018 08:39   Ct Angio Neck W And/or Wo Contrast  Result Date: 01/01/2018 CLINICAL DATA:  Acute presentation with weakness. History of previous stroke. Weakness worse on the left. Some speech disturbance. EXAM: CT ANGIOGRAPHY HEAD AND NECK TECHNIQUE: Multidetector CT imaging of the head and neck was performed using the standard  protocol during bolus administration of intravenous contrast. Multiplanar CT image reconstructions and MIPs were obtained to evaluate the vascular anatomy. Carotid stenosis measurements (when applicable) are obtained utilizing NASCET criteria, using the distal internal carotid diameter as the denominator. CONTRAST:  75mL OMNIPAQUE IOHEXOL 350 MG/ML SOLN COMPARISON:  CT same day.  MRI 08/13/2015. FINDINGS: CTA NECK FINDINGS Aortic arch: Normal Right carotid system: Common carotid artery is widely patent to the bifurcation. Carotid bifurcation appears normal, though there is some motion degradation. Cervical ICA is widely patent. Left carotid system: Common carotid artery is widely patent to the bifurcation. Carotid bifurcation appears normal. Cervical ICA is widely patent. Vertebral arteries: Both vertebral artery origins appear normal. The right vertebral artery slightly larger than the left. Both vertebral arteries appear normal through the cervical region to the foramen magnum. Skeleton: Normal Other neck: No soft tissue mass or lymphadenopathy. Upper chest: Normal Review of the MIP images confirms the above findings CTA HEAD FINDINGS Anterior circulation: Both internal carotid arteries are tortuous beneath the skull base but widely patent through the skull base and siphon regions. The anterior and middle cerebral vessels are normal without proximal stenosis, aneurysm or vascular malformation. No missing vessels are identified. Posterior circulation: Both vertebral arteries are patent at the foramen magnum. The small right vertebral artery terminates in PICA. The left vertebral artery supplies the basilar which  is a small but patent vessel. Superior cerebellar and posterior cerebral arteries are patent, with the posterior cerebral arteries receiving most of there supply from the anterior circulation. Venous sinuses: Patent and normal. Anatomic variants: None significant. Delayed phase: No abnormal enhancement. Old  left superior cerebellar stroke as previously shown. Review of the MIP images confirms the above findings IMPRESSION: Negative CT angiography. No evidence of stenotic disease or large/medium vessel occlusion. Electronically Signed   By: Paulina Fusi M.D.   On: 01/01/2018 10:01   Ct Head Code Stroke Wo Contrast  Result Date: 01/01/2018 CLINICAL DATA:  Code stroke. New onset of slurred speech, vision changes, and weakness beginning at 6:30 a.m. today. Focal neuro deficit, less than 6 hours. EXAM: CT HEAD WITHOUT CONTRAST TECHNIQUE: Contiguous axial images were obtained from the base of the skull through the vertex without intravenous contrast. COMPARISON:  MRI brain 08/13/2015. FINDINGS: Brain: No acute infarct, hemorrhage, or mass lesion is present. Encephalomalacia associated with the previous left cerebellar infarct is noted. Mild white matter changes are advanced for age. Basal ganglia are intact. Insular ribbon is normal. Brainstem and cerebellum are otherwise within normal limits. Vascular: No hyperdense vessel or unexpected calcification. Skull: Calvarium is intact. No focal lytic blastic lesions are present. Sinuses/Orbits: The paranasal sinuses and mastoid air cells are clear. Globes and orbits are within normal limits. ASPECTS [facility] Stroke Program Early CT Score) - Ganglionic level infarction (caudate, lentiform nuclei, internal capsule, insula, M1-M3 cortex): 7/7 - Supraganglionic infarction (M4-M6 cortex): 3/3 Total score (0-10 with 10 being normal): 10/10 IMPRESSION: 1. No acute intracranial abnormality. 2. Mild white matter changes are advanced for age. 3. Expected evolution of left superior cerebellar infarct noted on previous MRI. 4. ASPECTS is 10/10 These results were called by telephone at the time of interpretation on 01/01/2018 at 8:46 am to Dr. Sharyn Creamer , who verbally acknowledged these results. Electronically Signed   By: Marin Roberts M.D.   On: 01/01/2018 08:46   Transthoracic  Echocardiogram - pending  MRI pending  ICD interrogation pending   PHYSICAL EXAM  Temp:  [97.5 F (36.4 C)-98.7 F (37.1 C)] 97.5 F (36.4 C) (06/29 0800) Pulse Rate:  [53-93] 85 (06/29 0800) Resp:  [1-31] 14 (06/29 0900) BP: (85-145)/(49-111) 139/92 (06/29 0900) SpO2:  [93 %-99 %] 93 % (06/29 0900)  General - Well nourished, well developed, in no apparent distress.  Ophthalmologic - fundi not visualized due to noncooperation.  Cardiovascular - Regular rate and rhythm with no murmur.  Mental Status -  Level of arousal and orientation to time, place, and person were intact. Language including expression, naming, repetition, comprehension was assessed and found intact. Fund of Knowledge was assessed and was intact.  Cranial Nerves II - XII - II - Visual field intact OU. III, IV, VI - Extraocular movements intact. V - Facial sensation intact bilaterally. VII - Facial movement intact bilaterally. VIII - Hearing & vestibular intact bilaterally. X - Palate elevates symmetrically, mild dysarthria. XI - Chin turning & shoulder shrug intact bilaterally. XII - Tongue protrusion intact.  Motor Strength - The patient's strength was normal in all extremities and pronator drift was absent.  Bulk was normal and fasciculations were absent.   Motor Tone - Muscle tone was assessed at the neck and appendages and was normal.  Reflexes - The patient's reflexes were symmetrical in all extremities and he had no pathological reflexes.  Sensory - Light touch, temperature/pinprick were assessed and were symmetrical.    Coordination -  The patient had normal movements in the right hand and foot with no ataxia or dysmetria. However, mild left UE and LE ataxia, chronic. Tremor was absent.  Gait and Station - deferred.   ASSESSMENT/PLAN Mr. Donathan Buller. is a 47 y.o. male with history of CHF with EF 15-20% 10/2016 s/p ICD, CKD, CAD, DM, HTN, HLD, obesity, OSA on CPAP, substance abuse,  seizure in 10/2012 presenting with left sided weakness s/p tPA.  Stroke:  Possible right cerebellar infarct, likely embolic due to CHF with low EF  Resultant  Mild dysarthria  CT head no acute abnormality, left remote SCA infarct  Repeat CT head - possible acute right superior cerebellar infarct  MRI pending  CTA head and neck - unremarkable  2D Echo - pending  ICD interrogation pending  LDL 109  HgbA1c - 6.4  VTE prophylaxis - SCDs  clopidogrel 75 mg daily prior to admission, now on aspirin 325 mg daily. Consider anticoagulation once MRI ruled out large infarct  Patient counseled to be compliant with his antithrombotic medications  Ongoing aggressive stroke risk factor management  Therapy recommendations:  pending  Disposition:  Pending  Cardiomyopathy   Long standing CHF   EF 15-20% in 10/2016  s/p ICD in 12/2016  Likely the cause of recurrent stroke  Consider [facility] once MRI ruled out large infarct  Hx of stroke  08/2015 - left sided weakness, difficulty walking - MRI left SCA infarct - CUS neg, CTA h/n unremarkable - on plavix and statin  Hypertension  Stable . Long-term BP goal normotensive  Hyperlipidemia  Lipid lowering medication PTA:  pravastatin  LDL 109, goal < 70  Current lipid lowering medication:lipitor 40  Continue statin at discharge  Diabetes  HgbA1c 6.4, goal < 7.0  Controlled  CBG monitoring  SSI  Tobacco abuse  Current smoker  Smoking cessation counseling provided  Pt is willing to quit  Other Stroke Risk Factors  Morbid Obesity, Body mass index is 40.69 kg/m., recommend weight loss, diet and exercise as appropriate   OSA on CPAP  Other Active Problems  Seizure - 10/2012 - was on tegretol - now no AEDs  Hospital day # 1  This patient is critically ill due to stroke, hx of stroke, low EF with ICD and at significant risk of neurological worsening, death form recurrent stroke, heart failure. This patient's care  requires constant monitoring of vital signs, hemodynamics, respiratory and cardiac monitoring, review of multiple databases, neurological assessment, discussion with family, other specialists and medical decision making of high complexity. I spent 40 minutes of neurocritical care time in the care of this patient.  Marvel Plan, MD PhD Stroke Neurology 01/02/2018 11:54 AM    To contact Stroke Continuity provider, please refer to [email or web address]. After hours, contact General Neurology

## 2018-01-02 NOTE — Progress Notes (Signed)
Pt transferred to [address] with his cell phone wallet, and charger.

## 2018-01-02 NOTE — Progress Notes (Signed)
SLP Cancellation Note  Patient Details Name: Kent Stanley. MRN: [id number] DOB: Sep 24, 1970   Cancelled treatment:       Reason Eval/Treat Not Completed: Patient at procedure or test/unavailable. Pt off floor for CT; will continue efforts.  Rondel Baton, [place], CCC-SLP Speech-Language Pathologist [phone]    Arlana Lindau 01/02/2018, 8:36 AM

## 2018-01-02 NOTE — Progress Notes (Signed)
Report called to Angela at [phone].

## 2018-01-02 NOTE — Evaluation (Signed)
Physical Therapy Evaluation Patient Details Name: Kent Stanley. MRN: [id number] DOB: 02/19/1971 Today's Date: 01/02/2018   History of Present Illness  47 y.o. male with a history of 2 previous strokes and severe CHF who presents with left-sided weakness  Clinical Impression  Orders received for PT evaluation. Patient demonstrates modest deficits in strength and functional mobility as indicated below. Educated patient on HEP and encouraged patient to continue mobility during hospital course. At this time, feel patient will be safe for d/c home with intermittent supervision. Patient does not wish to pursue outpatient therapies and indicates that he is comfortable with HEP. No further acute PT needs, will sign off.     Follow Up Recommendations No PT follow up;Supervision - Intermittent    Equipment Recommendations  None recommended by PT    Recommendations for Other Services       Precautions / Restrictions Precautions Precautions: Fall      Mobility  Bed Mobility               General bed mobility comments: received in chair  Transfers Overall transfer level: Independent Equipment used: None             General transfer comment: no physical assist required  Ambulation/Gait Ambulation/Gait assistance: Independent Gait Distance (Feet): 210 Feet Assistive device: None Gait Pattern/deviations: Step-through pattern;Decreased weight shift to left;Drifts right/left Gait velocity: decreased   General Gait Details: patient steady but with some noted deviations due to LLE. Patient with mild instability during higher level tasks but no overt LOB noted  Stairs Stairs: Yes Stairs assistance: Modified independent (Device/Increase time) Stair Management: Step to pattern Number of Stairs: 12 General stair comments: VCs for sequencing and safety  Wheelchair Mobility    Modified Rankin (Stroke Patients Only) Modified Rankin (Stroke Patients Only) Pre-Morbid  Rankin Score: No symptoms Modified Rankin: Moderate disability     Balance Overall balance assessment: Needs assistance                           High level balance activites: Side stepping;Backward walking;Direction changes;Turns;Sudden stops;Head turns High Level Balance Comments: patient with noted instbaility during higher level tasks. 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[employer]   Dominant Hand: Right    Extremity/Trunk Assessment   Upper Extremity Assessment Upper Extremity Assessment: Defer to OT evaluation    Lower Extremity Assessment Lower Extremity Assessment: LLE deficits/detail LLE Deficits / Details: noted LLE weakness upon assessment, 4/5 gross motions LLE Sensation: WNL LLE Coordination: WNL       Communication   Communication: No difficulties  Cognition Arousal/Alertness: Awake/alert Behavior During Therapy: WFL for tasks assessed/performed Overall Cognitive Status: Within Functional Limits for tasks assessed  General Comments      Exercises     Assessment/Plan    PT Assessment Patent does not need any further PT services  PT Problem List         PT Treatment Interventions      PT Goals (Current goals can be found in the Care Plan section)  Acute Rehab PT  Goals PT Goal Formulation: All assessment and education complete, DC therapy    Frequency     Barriers to discharge        Co-evaluation               AM-PAC PT "6 Clicks" Daily Activity  Outcome Measure Difficulty turning over in bed (including adjusting bedclothes, sheets and blankets)?: None Difficulty moving from lying on back to sitting on the side of the bed? : None Difficulty sitting down on and standing up from a chair with arms (e.g., wheelchair, bedside commode, etc,.)?: None Help needed moving to and from a bed to chair (including a wheelchair)?: A Little Help needed walking in hospital room?: None Help needed climbing 3-5 steps with a railing? : A Little 6 Click Score: 22    End of Session   Activity Tolerance: Patient tolerated treatment well Patient left: in chair;with call bell/phone within reach;with nursing/sitter in room Nurse Communication: Mobility status PT Visit Diagnosis: Unsteadiness on feet (R26.81)    Time: 3888-7579 PT Time Calculation (min) (ACUTE ONLY): 20 min   Charges:   PT Evaluation $PT Eval Moderate Complexity: 1 Mod     PT G Codes:        Charlotte Crumb, PT DPT  Board Certified Neurologic Specialist [phone]   Fabio Asa 01/02/2018, 9:21 AM

## 2018-01-03 ENCOUNTER — Inpatient Hospital Stay (HOSPITAL_COMMUNITY): Payer: Medicaid Other

## 2018-01-03 DIAGNOSIS — E1110 Type 2 diabetes mellitus with ketoacidosis without coma: Secondary | ICD-10-CM

## 2018-01-03 LAB — BASIC METABOLIC PANEL
Anion gap: 6 (ref 5–15)
BUN: 16 mg/dL (ref 6–20)
CHLORIDE: 107 mmol/L (ref 98–111)
CO2: 25 mmol/L (ref 22–32)
Calcium: 8.9 mg/dL (ref 8.9–10.3)
Creatinine, Ser: 1.35 mg/dL — ABNORMAL HIGH (ref 0.61–1.24)
GFR calc Af Amer: >60 mL/min (ref >60)
GLUCOSE: 127 mg/dL — AB (ref 70–99)
POTASSIUM: 4 mmol/L (ref 3.5–5.1)
SODIUM: 138 mmol/L (ref 135–145)

## 2018-01-03 LAB — CBC
HEMATOCRIT: 45.5 % (ref 39.0–52.0)
HEMOGLOBIN: 15 g/dL (ref 13.0–17.0)
MCH: 33.6 pg (ref 26.0–34.0)
MCHC: 33 g/dL (ref 30.0–36.0)
MCV: 102 fL — ABNORMAL HIGH (ref 78.0–100.0)
Platelets: 207 10*3/uL (ref 150–400)
RBC: 4.46 MIL/uL (ref 4.22–5.81)
RDW: 13.2 % (ref 11.5–15.5)
WBC: 8.2 10*3/uL (ref 4.0–10.5)

## 2018-01-03 LAB — GLUCOSE, CAPILLARY: Glucose-Capillary: 115 mg/dL — ABNORMAL HIGH (ref 70–99)

## 2018-01-03 MED ORDER — WARFARIN SODIUM 5 MG PO TABS
5.0000 mg | ORAL_TABLET | Freq: Once | ORAL | Status: AC
  Administered 2018-01-03: 5 mg via ORAL
  Filled 2018-01-03: qty 1

## 2018-01-03 MED ORDER — ASPIRIN EC 325 MG PO TBEC: 325.0000 mg | DELAYED_RELEASE_TABLET | Freq: Every day | ORAL | Status: DC

## 2018-01-03 MED ORDER — ENOXAPARIN SODIUM 150 MG/ML ~~LOC~~ SOLN
135.0000 mg | Freq: Two times a day (BID) | SUBCUTANEOUS | Status: DC
  Administered 2018-01-03 – 2018-01-04 (×3): 135 mg via SUBCUTANEOUS
  Filled 2018-01-03 (×3): qty 0.9

## 2018-01-03 MED ORDER — WARFARIN - PHARMACIST DOSING INPATIENT
Freq: Every day | Status: DC
Start: 2018-01-03 — End: 2018-01-04
  Administered 2018-01-03: 18:00:00

## 2018-01-03 NOTE — Progress Notes (Addendum)
ANTICOAGULATION CONSULT NOTE - Initial Consult  Pharmacy Consult for Enoxparin Indication: In anticipation for bridging to warfarin in a patient with an embolic stroke in the setting of cardiomyopathy.   Allergies  Allergen Reactions  . Tomato Rash    Patient Measurements: Height: 6' (182.9 cm) Weight: (!) 300 lb 0.7 oz (136.1 kg) IBW/kg (Calculated) : 77.6 Heparin Dosing Weight:136.1 kg  Vital Signs: Temp: 98 F (36.7 C) (06/30 0811) Temp Source: Oral (06/30 0811) BP: 131/94 (06/30 0811) Pulse Rate: 65 (06/30 0811)  Labs: Recent Labs    01/01/18 0829 01/02/18 0301 01/03/18 0602  HGB 15.3 14.2 15.0  HCT 44.2 42.8 45.5  PLT 242 192 207  APTT 28  --   --   LABPROT 13.4  --   --   INR 1.03  --   --   CREATININE 1.36* 1.45* 1.35*  TROPONINI 0.07*  --   --     Estimated Creatinine Clearance: 94.6 mL/min (A) (by C-G formula based on SCr of 1.35 mg/dL (H)).   Medical History: Past Medical History:  Diagnosis Date  . Cerebellar Stroke    a. 08/2015 MRI: Acute/subacute L superior cerebellar infarct. Remote R cerebellar infarct;  b. 08/2015 Carotid U/S: normal carotids.  . CHF (congestive heart failure) (HCC)   . Chronic systolic heart failure (HCC)    a. 10/2012 Echo: EF <20%, mod to sev dil LV, diast dysfxn, mildly dil LA/RA, mod to severe MR/TR.  . CKD (chronic kidney disease), stage II   . Coronary artery disease, non-occlusive    a. 09/2014 Cath: minor luminal irregularities with severely dilated left ventricle. LVEDP 30.  [name] H/O noncompliance with medical treatment, presenting hazards to health   . History of traumatic injury of head   . Hyperlipidemia   . Hypertension   . Morbid obesity (HCC)   . NICM (nonischemic cardiomyopathy) (HCC)    a. 10/2012 Echo: EF <20%.  . Polysubstance abuse (HCC)    a. cocaine, tobacco, THC, ETOH  . Seizure disorder (HCC)   . Stroke (HCC)   . Type II diabetes mellitus [facility])       Assessment: 47 yo male admitted with stroke  (s/p TPA 6/28 in AM) with n o apical thrombus seen on TTE and no bleeding per CT. To start therapeutic enoxaparin in the anticipation of bridging to warfarin. Hgb 15 and Scr stable (CrCl ~95). Patient weight 136 kg.   Goal of Therapy:  INR 2-3 Monitor platelets by anticoagulation protocol: Yes   Plan:  Enoxaparin 135 mg SQ q12h Monitor for s/sx of bleeding Monitor renal function D/C aspirin per provider order (Dose given 6/30)  Addendum:  Order to start warfarin per pharmacy. INR on 6/28 was 1.03. Will initiate warfarin 5mg  tonight.  Daily INR while inpatient.     Berthold Glace A. Jeanella Craze, PharmD, BCPS Clinical Pharmacist [place] Pager: [phone] Please utilize Amion for appropriate phone number to reach the unit pharmacist [facility])   01/03/2018,10:51 AM

## 2018-01-03 NOTE — Progress Notes (Signed)
STROKE TEAM PROGRESS NOTE   SUBJECTIVE (INTERVAL HISTORY) Girlfriend is at the bedside and the patient's nurse was present as well.  The patient is anxious for discharge.  Apparently his oral diabetic medication was restarted today and his glucose dropped causing the patient to feel shaky.  He had decrease the dose of this medication on his own at home prior to discharge.  He feels the dose is too high and does not want to take the medication while in the hospital.  He also does not want to have fingerstick glucose levels performed so frequently.  Overall he feels he is doing better.  He is chronically mildly weak on his left side from his previous stroke.  He feels his balance is significantly improved from his most recent stroke.  We discussed the fact that he will be started on Lovenox and Coumadin.   OBJECTIVE Temp:  [97.7 F (36.5 C)-98.6 F (37 C)] 98.2 F (36.8 C) (06/30 1240) Pulse Rate:  [65-79] 67 (06/30 1240) Cardiac Rhythm: Normal sinus rhythm (06/30 0700) Resp:  [16-20] 16 (06/30 1240) BP: (114-131)/(79-96) 116/82 (06/30 1240) SpO2:  [93 %-97 %] 96 % (06/30 1240)  CBC:  Recent Labs  Lab 01/01/18 0829 01/02/18 0301 01/03/18 0602  WBC 6.9 6.9 8.2  NEUTROABS 4.3  --   --   HGB 15.3 14.2 15.0  HCT 44.2 42.8 45.5  MCV 101.8* 102.1* 102.0*  PLT 242 192 207    Basic Metabolic Panel:  Recent Labs  Lab 01/02/18 0301 01/03/18 0602  NA 139 138  K 3.7 4.0  CL 107 107  CO2 25 25  GLUCOSE 108* 127*  BUN 16 16  CREATININE 1.45* 1.35*  CALCIUM 8.4* 8.9    Lipid Panel:     Component Value Date/Time   CHOL 163 01/02/2018 0301   CHOL 183 10/14/2012 0354   TRIG 103 01/02/2018 0301   TRIG 79 10/14/2012 0354   HDL 33 (L) 01/02/2018 0301   HDL 44 10/14/2012 0354   CHOLHDL 4.9 01/02/2018 0301   VLDL 21 01/02/2018 0301   VLDL 16 10/14/2012 0354   LDLCALC 109 (H) 01/02/2018 0301   LDLCALC 123 (H) 10/14/2012 0354   HgbA1c:  Lab Results  Component Value Date   HGBA1C  6.4 (H) 01/02/2018   Urine Drug Screen:     Component Value Date/Time   LABOPIA NONE DETECTED 01/01/2018 0829   COCAINSCRNUR NONE DETECTED 01/01/2018 0829   LABBENZ NONE DETECTED 01/01/2018 0829   AMPHETMU NONE DETECTED 01/01/2018 0829   THCU POSITIVE (A) 01/01/2018 0829   LABBARB (A) 01/01/2018 0829    Result not available. Reagent lot number recalled by manufacturer.    Alcohol Level     Component Value Date/Time   ETH <10 01/01/2018 0829    IMAGING I have personally reviewed the radiological images below and agree with the radiology interpretations.  Ct Head Wo Contrast 01/03/2018 IMPRESSION:  1. Stable compared yesterday.  2. Small recent right superior cerebellar infarct.  3. Remote left superior cerebellar infarct.   Ct Head Wo Contrast 01/02/2018 IMPRESSION:  1. A small right superior cerebellar infarct has become apparent since yesterday.  2. Negative for hemorrhage post tPA.  3. Remote left superior cerebellar infarct.   TTE  01/02/2018 Study Conclusions - Left ventricle: The cavity size was severely dilated. There was   severe concentric hypertrophy. LV apical false tendon. Systolic   function was severely reduced. The estimated ejection fraction   was in the range of  20% to 25%. Diffuse hypokinesis. Doppler   parameters are consistent with abnormal left ventricular   relaxation (grade 1 diastolic dysfunction). LV filling pressure   is elevated. - Mitral valve: Mildly thickened leaflets . There was moderate   regurgitation. - Left atrium: The atrium was normal in size. - Right ventricle: The cavity size was mildly dilated. Pacer wire   or catheter noted in right ventricle. Systolic function was   normal. - Right atrium: The atrium was normal in size. Pacer wire or   catheter noted in right atrium. - Tricuspid valve: There was trivial regurgitation. - Pulmonary arteries: Dilated. PA peak pressure: 22 mm Hg (S). - Inferior vena cava: The vessel was normal  in size. The   respirophasic diameter changes were in the normal range (= 50%),   consistent with normal central venous pressure. Impressions: - Compared to a prior study in 2018, the LVEF may be slightly   higher at 20-25%. No definite apical thrombus, however, in the   setting of stroke, recommend limited Definity contrast study to   r/o apical thrombus.  ICD interrogation - pending   PHYSICAL EXAM  Temp:  [97.7 F (36.5 C)-98.6 F (37 C)] 98.2 F (36.8 C) (06/30 1240) Pulse Rate:  [65-79] 67 (06/30 1240) Resp:  [16-20] 16 (06/30 1240) BP: (114-131)/(79-96) 116/82 (06/30 1240) SpO2:  [93 %-97 %] 96 % (06/30 1240)  General - Well nourished, well developed, in no apparent distress.  Ophthalmologic - fundi not visualized due to noncooperation.  Cardiovascular - Regular rate and rhythm with no murmur.  Mental Status -  Level of arousal and orientation to time, place, and person were intact. Language including expression, naming, repetition, comprehension was assessed and found intact. Fund of Knowledge was assessed and was intact.  Cranial Nerves II - XII - II - Visual field intact OU. III, IV, VI - Extraocular movements intact. V - Facial sensation intact bilaterally. VII - Facial movement intact bilaterally. VIII - Hearing & vestibular intact bilaterally. X - Palate elevates symmetrically, mild dysarthria. XI - Chin turning & shoulder shrug intact bilaterally. XII - Tongue protrusion intact.  Motor Strength - The patient's strength was normal in all extremities and pronator drift was absent.  Bulk was normal and fasciculations were absent.   Motor Tone - Muscle tone was assessed at the neck and appendages and was normal.  Reflexes - The patient's reflexes were symmetrical in all extremities and he had no pathological reflexes.  Sensory - Light touch, temperature/pinprick were assessed and were symmetrical.    Coordination - The patient had normal movements in the  right hand and foot with no ataxia or dysmetria. However, mild left UE and LE ataxia, chronic. Tremor was absent.  Gait and Station - deferred.   ASSESSMENT/PLAN Mr. Faraaz Stec. is a 47 y.o. male with history of CHF with EF 15-20% 10/2016 s/p ICD, CKD, CAD, DM, HTN, HLD, obesity, OSA on CPAP, substance abuse, seizure in 10/2012 presenting with left sided weakness s/p tPA.  Stroke:  Right superior cerebellar small infarct, likely embolic due to CHF with low EF  Resultant  Mild dysarthria  CT head no acute abnormality, left remote SCA infarct  Repeat CT head x 2 - small acute right superior cerebellar infarct  CTA head and neck - unremarkable  2D Echo - EF 20 to 25% No cardiac source of emboli identified.  ICD interrogation pending  LDL 109  HgbA1c - 6.4  VTE prophylaxis - SCDs  clopidogrel 75 mg daily prior to admission, now on aspirin 325 mg daily. Due to low EF and stroke, will initiate coumadin with lovenox bridge. INR goal 2-3.   Patient counseled to be compliant with his antithrombotic medications  Ongoing aggressive stroke risk factor management  Therapy recommendations: none  Disposition:  Pending  Cardiomyopathy   Long standing CHF   EF 15-20% in 10/2016  s/p ICD in 12/2016  EF 20-25% this admission  Likely the cause of recurrent stroke  Coumadin with lovenox bridge per pharmacy  Hx of stroke  08/2015 - left sided weakness, difficulty walking - MRI left SCA infarct - CUS neg, CTA h/n unremarkable - on plavix and statin  Hypertension  Stable . Long-term BP goal normotensive  Hyperlipidemia  Lipid lowering medication PTA:  pravastatin  LDL 109, goal < 70  Current lipid lowering medication:lipitor 40  Continue statin at discharge  Diabetes  HgbA1c 6.4, goal < 7.0  Controlled  CBG monitoring   SSI   Discontinue glipizide.  Patient refuses to take this medication.  Tobacco abuse  Current smoker  Smoking cessation counseling  provided  Pt is willing to quit  Other Stroke Risk Factors  Morbid Obesity, Body mass index is 40.69 kg/m., recommend weight loss, diet and exercise as appropriate   OSA on CPAP  Other Active Problems  Seizure - 10/2012 - was on tegretol - now no AEDs  The patient is anxious for discharge.  Hospital day # 2  Marvel Plan, MD PhD Stroke Neurology 01/03/2018 9:27 PM   To contact Stroke Continuity provider, please refer to [email or web address]. After hours, contact General Neurology

## 2018-01-03 NOTE — Progress Notes (Signed)
OT Cancellation Note  Patient Details Name: Kent Stanley. MRN: [id number] DOB: [birth year]   Cancelled Treatment:    Reason Eval/Treat Not Completed: Patient at procedure or test/ unavailable. AT CT.   Evern Bio Alayja Armas 01/03/2018, 9:10 AM  Sherryl Manges OTR/L [phone]

## 2018-01-03 NOTE — Evaluation (Signed)
Occupational Therapy Evaluation and Discharge Patient Details Name: Kent Stanley. MRN: [id number] DOB: [birth year] Today's Date: 01/03/2018    History of Present Illness 47 y.o. 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 Pt reports that he is at baseline today with evaluation. Able to perform ADL at independent level (supervision in the hospital) and has no questions or concerns. He does not want any follow up therapy. Encouraged functional use of LUE - emphasis on safety and function since he works in an environment with hot stoves, sharp objects very distracting and sensory filled etc. Education complete and no questions or concerns from Pt at the end of session. Thank you for the opportunity to serve this patient.     Follow Up Recommendations  No OT follow up    Equipment Recommendations  None recommended by OT    Recommendations for Other Services       Precautions / Restrictions Precautions Precautions: Fall Restrictions Weight Bearing Restrictions: No      Mobility Bed Mobility Overal bed mobility: Independent                Transfers Overall transfer level: Independent Equipment used: None             General transfer comment: no physical assist required    Balance Overall balance assessment: Needs assistance Sitting-balance support: No upper extremity supported;Feet supported Sitting balance-Leahy Scale: Normal     Standing balance support: No upper extremity supported;During functional activity Standing balance-Leahy Scale: Good                             ADL either performed or assessed with clinical judgement   ADL Overall ADL's : At baseline                                       General ADL Comments: Pt  able to don/doff socks sitting EOB, manage underwear sit <>stand, perform sink level grooming, and oral care including opening containers etc.      Vision Patient Visual Report: No change from baseline Vision Assessment?: No apparent visual deficits(functionally)     Perception     Praxis      Pertinent Vitals/Pain Pain Assessment: No/denies pain Pain Intervention(s): Monitored during session     Hand Dominance Right   Extremity/Trunk Assessment Upper Extremity Assessment Upper Extremity Assessment: LUE deficits/detail LUE Deficits / Details: generalized 4/5 strength in LUE (baseline from previous CVA) LUE Sensation: WNL LUE Coordination: (slight deficits at baseline - per Pt report he is at baselin)   Lower Extremity Assessment Lower Extremity Assessment: Defer to PT evaluation       Communication Communication Communication: No difficulties   Cognition Arousal/Alertness: Awake/alert Behavior During Therapy: WFL for tasks assessed/performed Overall Cognitive Status: Within Functional Limits for tasks assessed                                     General Comments       Exercises     Shoulder Instructions      Home Living Family/patient expects to  be discharged to:: Private residence Living Arrangements: Alone Available Help at Discharge: Friend(s) Type of Home: House Home Access: Stairs to enter Secretary/administrator of Steps: 14         Bathroom Shower/Tub: Chief Strategy Officer: Standard     Home Equipment: None          Prior Functioning/Environment Level of Independence: Independent        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[employer]        OT Problem List:        OT Treatment/Interventions:      OT Goals(Current goals can be found in the care plan section) Acute Rehab OT Goals OT Goal Formulation: All assessment and education complete, DC therapy  OT Frequency:     Barriers to D/C:             Co-evaluation              AM-PAC PT "6 Clicks" Daily Activity     Outcome Measure Help from another person eating meals?: None Help from another person taking care of personal grooming?: None Help from another person toileting, which includes using toliet, bedpan, or urinal?: None Help from another person bathing (including washing, rinsing, drying)?: None Help from another person to put on and taking off regular upper body clothing?: None Help from another person to put on and taking off regular lower body clothing?: None 6 Click Score: 24   End of Session Equipment Utilized During Treatment: Gait belt Nurse Communication: Mobility status  Activity Tolerance: Patient tolerated treatment well Patient left: in chair;with family/visitor present;with call bell/phone within reach                   Time: 8119-1478 OT Time Calculation (min): 24 min Charges:  OT General Charges $OT Visit: 1 Visit OT Evaluation $OT Eval Moderate Complexity: 1 Mod OT Treatments $Self Care/Home Management : 8-22 mins G-Codes:     Sherryl Manges OTR/L [phone]  Evern Bio Sharilynn Cassity 01/03/2018, 5:44 PM

## 2018-01-03 NOTE — Progress Notes (Signed)
Patient refusing to have CBG finger test.

## 2018-01-03 NOTE — Discharge Instructions (Addendum)

## 2018-01-04 ENCOUNTER — Other Ambulatory Visit: Payer: Self-pay | Admitting: Neurology

## 2018-01-04 ENCOUNTER — Other Ambulatory Visit: Payer: Self-pay

## 2018-01-04 ENCOUNTER — Encounter (HOSPITAL_COMMUNITY): Payer: Self-pay | Admitting: *Deleted

## 2018-01-04 DIAGNOSIS — E785 Hyperlipidemia, unspecified: Secondary | ICD-10-CM

## 2018-01-04 DIAGNOSIS — Z8673 Personal history of transient ischemic attack (TIA), and cerebral infarction without residual deficits: Secondary | ICD-10-CM

## 2018-01-04 DIAGNOSIS — I480 Paroxysmal atrial fibrillation: Secondary | ICD-10-CM

## 2018-01-04 DIAGNOSIS — F17200 Nicotine dependence, unspecified, uncomplicated: Secondary | ICD-10-CM | POA: Diagnosis present

## 2018-01-04 DIAGNOSIS — G4733 Obstructive sleep apnea (adult) (pediatric): Secondary | ICD-10-CM

## 2018-01-04 DIAGNOSIS — G40909 Epilepsy, unspecified, not intractable, without status epilepticus: Secondary | ICD-10-CM

## 2018-01-04 DIAGNOSIS — I4891 Unspecified atrial fibrillation: Secondary | ICD-10-CM | POA: Diagnosis not present

## 2018-01-04 DIAGNOSIS — I5022 Chronic systolic (congestive) heart failure: Secondary | ICD-10-CM

## 2018-01-04 DIAGNOSIS — I63441 Cerebral infarction due to embolism of right cerebellar artery: Secondary | ICD-10-CM

## 2018-01-04 LAB — BASIC METABOLIC PANEL
ANION GAP: 11 (ref 5–15)
BUN: 15 mg/dL (ref 6–20)
CALCIUM: 9 mg/dL (ref 8.9–10.3)
CO2: 23 mmol/L (ref 22–32)
Chloride: 102 mmol/L (ref 98–111)
Creatinine, Ser: 1.3 mg/dL — ABNORMAL HIGH (ref 0.61–1.24)
GFR calc non Af Amer: >60 mL/min (ref >60)
GLUCOSE: 108 mg/dL — AB (ref 70–99)
Potassium: 4.2 mmol/L (ref 3.5–5.1)
Sodium: 136 mmol/L (ref 135–145)

## 2018-01-04 LAB — CBC
HCT: 46.5 % (ref 39.0–52.0)
HEMOGLOBIN: 15.8 g/dL (ref 13.0–17.0)
MCH: 33.7 pg (ref 26.0–34.0)
MCHC: 34 g/dL (ref 30.0–36.0)
MCV: 99.1 fL (ref 78.0–100.0)
PLATELETS: 260 10*3/uL (ref 150–400)
RBC: 4.69 MIL/uL (ref 4.22–5.81)
RDW: 12.8 % (ref 11.5–15.5)
WBC: 7.4 10*3/uL (ref 4.0–10.5)

## 2018-01-04 LAB — PROTIME-INR
INR: 1.01
PROTHROMBIN TIME: 13.2 s (ref 11.4–15.2)

## 2018-01-04 MED ORDER — APIXABAN 5 MG PO TABS: 5.0000 mg | ORAL_TABLET | Freq: Two times a day (BID) | ORAL | 2 refills | Status: DC

## 2018-01-04 MED ORDER — APIXABAN 5 MG PO TABS: 5.0000 mg | ORAL_TABLET | Freq: Two times a day (BID) | ORAL | Status: DC

## 2018-01-04 NOTE — Progress Notes (Signed)
Patient ready for discharge to home; discharge instructions given and reviewed; Rx sent electronically; patient verbalizes instructions; patient dressed and ready for discharge; patient ambulated out with family member refused wheelchair; all belongings returned.

## 2018-01-04 NOTE — Progress Notes (Signed)
ANTICOAGULATION CONSULT NOTE   Pharmacy Consult for Enoxaparin  - > to Eliquis Indication: Afib / CM  Allergies  Allergen Reactions  . Tomato Rash    Patient Measurements: Height: 6' (182.9 cm) Weight: (!) 300 lb 0.7 oz (136.1 kg) IBW/kg (Calculated) : 77.6 Heparin Dosing Weight:136.1 kg  Vital Signs: Temp: 97.7 F (36.5 C) (07/01 0840) Temp Source: Oral (07/01 0840) BP: 116/83 (07/01 0840) Pulse Rate: 76 (07/01 0840)  Labs: Recent Labs    01/02/18 0301 01/03/18 0602 01/04/18 0655  HGB 14.2 15.0 15.8  HCT 42.8 45.5 46.5  PLT 192 207 260  LABPROT  --   --  13.2  INR  --   --  1.01  CREATININE 1.45* 1.35* 1.30*    Estimated Creatinine Clearance: 98.2 mL/min (A) (by C-G formula based on SCr of 1.3 mg/dL (H)).   Assessment: 47 yo male admitted with stroke (s/p TPA 6/28 in AM) in setting of Afib and CM Last dose of Lovenox at 930 am  Goal of Therapy:  Monitor platelets by anticoagulation protocol: Yes   Plan:  DC Lovenox and warfarin Start Eliquis 5 mg po BID tonight Follow up CBC  Thank you Okey Regal, PharmD [phone] 01/04/2018,11:10 AM

## 2018-01-04 NOTE — Care Management Note (Signed)
Case Management Note  Patient Details  Name: Kent Stanley. MRN: [id number] Date of Birth: [birth year]  Subjective/Objective:      Pt admitted with CVA. He is s/p TPA. He is from home alone. Pt without insurance listed. Pt goes to [facility] for his PCP.            Action/Plan: CM consulted for Eliquis. CM made several phone calls and patient has Medicare that started today. Medicare #[id number]. Pt also has [employer] for his medication coverage through Medicare D. CM called and inquired about the Eliquis and his co pay is $8.50 after the first 30 days which is free with 30 day free Coupon CM provided him. No f/u per PT/OT and no DME needs. Pt states he has transportation home.   Expected Discharge Date:                  Expected Discharge Plan:  Home/Self Care  In-House Referral:     Discharge planning Services  CM Consult, Medication Assistance  Post Acute Care Choice:    Choice offered to:     DME Arranged:    DME Agency:     HH Arranged:    HH Agency:     Status of Service:  Completed, signed off  If discussed at [employer] of Stay Meetings, dates discussed:    Additional Comments:  Kermit Balo, RN 01/04/2018, 12:36 PM

## 2018-01-04 NOTE — Discharge Summary (Addendum)
Stroke Discharge Summary  Patient ID: Kent Stanley   MRN: [id number]      DOB: 04/06/1971  Date of Admission: 01/01/2018 Date of Discharge: 01/04/2018  Attending Physician:  Rosalin Hawking, MD, Stroke MD Consultant(s):    None  Patient's PCP:  Olin Hauser, DO  DISCHARGE DIAGNOSIS:  Principal Problem:   Stroke (cerebrum) [facility]) Active Problems:   Chronic systolic heart failure ([place])   Hyperlipidemia   Hypertension   OSA (obstructive sleep apnea)   History of CVA (cerebrovascular accident)   Morbid obesity ([place])   Seizure disorder ([place])   Atrial fibrillation ([place])   Tobacco use disorder   Past Medical History:  Diagnosis Date  . Cerebellar Stroke    a. 08/2015 MRI: Acute/subacute L superior cerebellar infarct. Remote R cerebellar infarct;  b. 08/2015 Carotid U/S: normal carotids.  . CHF (congestive heart failure) ([place])   . Chronic systolic heart failure ([place])    a. 10/2012 Echo: EF <20%, mod to sev dil LV, diast dysfxn, mildly dil LA/RA, mod to severe MR/TR.  . CKD (chronic kidney disease), stage II   . Coronary artery disease, non-occlusive    a. 09/2014 Cath: minor luminal irregularities with severely dilated left ventricle. LVEDP 30.  [name] H/O noncompliance with medical treatment, presenting hazards to health   . History of traumatic injury of head   . Hyperlipidemia   . Hypertension   . Morbid obesity ([place])   . NICM (nonischemic cardiomyopathy) ([place])    a. 10/2012 Echo: EF <20%.  . Polysubstance abuse ([place])    a. cocaine, tobacco, THC, ETOH  . Seizure disorder ([place])   . Stroke ([place])   . Type II diabetes mellitus ([place])    Past Surgical History:  Procedure Laterality Date  . CARDIAC CATHETERIZATION     [facility]  . ICD IMPLANT N/A 12/08/2016   Procedure: ICD Implant;  Surgeon: Deboraha Sprang, MD;  Location: [facility];  Service: Cardiovascular;  Laterality: N/A;    Allergies as of 01/04/2018      Reactions   Tomato Rash      Medication List     STOP taking these medications   clopidogrel 75 MG tablet Commonly known as:  PLAVIX     TAKE these medications   apixaban 5 MG Tabs tablet Commonly known as:  ELIQUIS Take 1 tablet (5 mg total) by mouth 2 (two) times daily.   blood glucose meter kit and supplies Kit Dispense based on patient and insurance preference. Check blood glucose once daily.   ENTRESTO 49-51 MG Generic drug:  sacubitril-valsartan take 1 tablet by mouth twice a day   furosemide 40 MG tablet Commonly known as:  LASIX Take 1 tablet (40 mg total) by mouth daily. Take 20 mg tablet every evening. What changed:    how much to take  when to take this  additional instructions   glipiZIDE 10 MG tablet Commonly known as:  GLUCOTROL Take 1 tablet (10 mg total) by mouth daily before breakfast.   ibuprofen 200 MG tablet Commonly known as:  ADVIL,MOTRIN Take 400-800 mg by mouth daily as needed for moderate pain.   metoprolol succinate 25 MG 24 hr tablet Commonly known as:  TOPROL-XL Take 1 tablet (25 mg total) by mouth daily.   pravastatin 40 MG tablet Commonly known as:  PRAVACHOL Take 2 tablets (80 mg total) by mouth daily.   sildenafil 20 MG tablet Commonly known as:  REVATIO Take 5  tablets (100 mg total) by mouth as needed. Take about 30 min prior to sex. Caution stop taking if chest pain. Do not take with Nitro.   spironolactone 25 MG tablet Commonly known as:  ALDACTONE Take 0.5 tablets (12.5 mg total) by mouth daily.       LABORATORY STUDIES CBC    Component Value Date/Time   WBC 7.4 01/04/2018 0655   RBC 4.69 01/04/2018 0655   HGB 15.8 01/04/2018 0655   HGB 14.8 12/02/2016 0925   HCT 46.5 01/04/2018 0655   HCT 42.8 12/02/2016 0925   PLT 260 01/04/2018 0655   PLT 221 12/02/2016 0925   MCV 99.1 01/04/2018 0655   MCV 93 12/02/2016 0925   MCV 98 06/21/2014 1855   MCH 33.7 01/04/2018 0655   MCHC 34.0 01/04/2018 0655   RDW 12.8 01/04/2018 0655   RDW 14.0 12/02/2016 0925   RDW 15.2  (H) 06/21/2014 1855   LYMPHSABS 1.6 01/01/2018 0829   LYMPHSABS 1.7 12/02/2016 0925   LYMPHSABS 1.4 06/21/2014 1855   MONOABS 0.7 01/01/2018 0829   MONOABS 0.9 06/21/2014 1855   EOSABS 0.2 01/01/2018 0829   EOSABS 0.1 12/02/2016 0925   EOSABS 0.3 06/21/2014 1855   BASOSABS 0.1 01/01/2018 0829   BASOSABS 0.0 12/02/2016 0925   BASOSABS 0.0 06/21/2014 1855   CMP    Component Value Date/Time   NA 136 01/04/2018 0655   NA 141 12/02/2016 0925   NA 138 06/21/2014 1855   K 4.2 01/04/2018 0655   K 4.2 06/21/2014 1855   CL 102 01/04/2018 0655   CL 103 06/21/2014 1855   CO2 23 01/04/2018 0655   CO2 32 06/21/2014 1855   GLUCOSE 108 (H) 01/04/2018 0655   GLUCOSE 92 06/21/2014 1855   BUN 15 01/04/2018 0655   BUN 23 12/02/2016 0925   BUN 23 (H) 06/21/2014 1855   CREATININE 1.30 (H) 01/04/2018 0655   CREATININE 1.62 (H) 02/12/2016 1404   CALCIUM 9.0 01/04/2018 0655   CALCIUM 8.7 06/21/2014 1855   PROT 6.8 01/01/2018 0829   PROT 7.5 06/21/2014 1855   ALBUMIN 3.6 01/01/2018 0829   ALBUMIN 3.4 06/21/2014 1855   AST 26 01/01/2018 0829   AST 42 (H) 06/21/2014 1855   ALT 19 01/01/2018 0829   ALT 36 06/21/2014 1855   ALKPHOS 34 (L) 01/01/2018 0829   ALKPHOS 55 06/21/2014 1855   BILITOT 0.8 01/01/2018 0829   BILITOT 0.7 06/21/2014 1855   GFRNONAA >60 01/04/2018 0655   GFRNONAA 50 (L) 02/12/2016 1404   GFRAA >60 01/04/2018 0655   GFRAA 58 (L) 02/12/2016 1404   COAGS Lab Results  Component Value Date   INR 1.01 01/04/2018   INR 1.03 01/01/2018   INR 1.0 12/02/2016   Lipid Panel    Component Value Date/Time   CHOL 163 01/02/2018 0301   CHOL 183 10/14/2012 0354   TRIG 103 01/02/2018 0301   TRIG 79 10/14/2012 0354   HDL 33 (L) 01/02/2018 0301   HDL 44 10/14/2012 0354   CHOLHDL 4.9 01/02/2018 0301   VLDL 21 01/02/2018 0301   VLDL 16 10/14/2012 0354   LDLCALC 109 (H) 01/02/2018 0301   LDLCALC 123 (H) 10/14/2012 0354   HgbA1C  Lab Results  Component Value Date   HGBA1C 6.4  (H) 01/02/2018   Urinalysis    Component Value Date/Time   COLORURINE YELLOW (A) 01/01/2018 0824   APPEARANCEUR CLEAR (A) 01/01/2018 0824   APPEARANCEUR Clear 10/14/2012 0800   LABSPEC 1.026 01/01/2018  0824   LABSPEC 1.015 10/14/2012 0800   PHURINE 6.0 01/01/2018 0824   GLUCOSEU NEGATIVE 01/01/2018 0824   GLUCOSEU Negative 10/14/2012 0800   HGBUR NEGATIVE 01/01/2018 0824   BILIRUBINUR NEGATIVE 01/01/2018 0824   BILIRUBINUR negative 12/25/2014 0945   BILIRUBINUR Negative 10/14/2012 0800   KETONESUR NEGATIVE 01/01/2018 0824   PROTEINUR 30 (A) 01/01/2018 0824   UROBILINOGEN negative 12/25/2014 0945   NITRITE NEGATIVE 01/01/2018 0824   LEUKOCYTESUR NEGATIVE 01/01/2018 0824   LEUKOCYTESUR Negative 10/14/2012 0800   Urine Drug Screen     Component Value Date/Time   LABOPIA NONE DETECTED 01/01/2018 0829   COCAINSCRNUR NONE DETECTED 01/01/2018 0829   LABBENZ NONE DETECTED 01/01/2018 0829   AMPHETMU NONE DETECTED 01/01/2018 0829   THCU POSITIVE (A) 01/01/2018 0829   LABBARB (A) 01/01/2018 0829    Result not available. Reagent lot number recalled by manufacturer.    Alcohol Level    Component Value Date/Time   ETH <10 01/01/2018 0829     SIGNIFICANT DIAGNOSTIC STUDIES Ct Head Wo Contrast 01/03/2018 IMPRESSION:  1. Stable compared yesterday.  2. Small recent right superior cerebellar infarct.  3. Remote left superior cerebellar infarct.   Ct Head Wo Contrast 01/02/2018 IMPRESSION:  1. A small right superior cerebellar infarct has become apparent since yesterday.  2. Negative for hemorrhage post tPA.  3. Remote left superior cerebellar infarct.   TTE  01/02/2018 Study Conclusions - Left ventricle: The cavity size was severely dilated. There wassevere concentric hypertrophy. LV apical false tendon. Systolicfunction was severely reduced. The estimated ejection fractionwas in the range of 20% to 25%. Diffuse hypokinesis. Dopplerparameters are consistent with abnormal  left ventricularrelaxation (grade 1 diastolic dysfunction). LV filling pressureis elevated. - Mitral valve: Mildly thickened leaflets . There was moderateregurgitation. - Left atrium: The atrium was normal in size. - Right ventricle: The cavity size was mildly dilated. Pacer wireor catheter noted in right ventricle. Systolic function wasnormal. - Right atrium: The atrium was normal in size. Pacer wire orcatheter noted in right atrium. - Tricuspid valve: There was trivial regurgitation. - Pulmonary arteries: Dilated. PA peak pressure: 22 mm Hg (S). - Inferior vena cava: The vessel was normal in size. Therespirophasic diameter changes were in the normal range (= 50%),consistent with normal central venous pressure. Impressions: - Compared to a prior study in 2018, the LVEF may be slightlyhigher at 20-25%. No definite apical thrombus, however, in thesetting of stroke, recommend limited Definity contrast study tor/o apical thrombus.  ICD interrogation - positive for atrial fibrillation      HISTORY OF PRESENT ILLNESS Danial Hlavac. is a 47 y.o. male with a history of 2 previous strokes and severe CHF who presents with left-sided weakness that started abruptly this morning.  He was driving back from [place], and was stop and get some breakfast when he noticed that he was not feeling right. He became concerned that he was not having a stroke and therefore went to [facility] where he was evaluated by tele-neurology who felt that he merited IV TPA and therefore this was started. In the ED prior to transfer, CTA was performed which demonstrates no large vessel occlusion.  His last stroke was in February 2017 when he was seen by Alexis Goodell, MD [facility] at that time with mild left-sided weakness and found to have a cerebellar stroke.  He also has a history of severe heart failure with his last echo reading an EF of 15 to 20% and he has an implanted cardiac  defibrillator.  This does  appear to be an MRI compatible defibrilator. ICH Score: 0. Modified Rankin Scale: 1-No significant post stroke disability and can perform usual duties with stroke symptoms.     HOSPITAL COURSE Mr. Aidenn Skellenger. is a 47 y.o. male with history of CHF with EF 15-20% 10/2016 s/p ICD, CKD, CAD, DM, HTN, HLD, obesity, OSA on CPAP, substance abuse, seizure in 10/2012 presenting with left sided weakness s/p IV tPA.  Stroke:  Right superior cerebellar small infarct, likely embolic due to CHF with low EF  Resultant  Mild dysarthria  CT head no acute abnormality, left remote SCA infarct  Repeat CT head x 2 - small acute right superior cerebellar infarct  CTA head and neck - unremarkable  2D Echo - EF 20 to 25% No cardiac source of emboli identified.  ICD interrogation positive for atrial fibrillation   LDL 109  HgbA1c - 6.4  clopidogrel 75 mg daily prior to admission, now on aspirin 325 mg daily. Due to low EF and stroke, will initiate coumadin with lovenox bridge. INR goal 2-3.   Patient counseled to be compliant with his antithrombotic medications  Ongoing aggressive stroke risk factor management  Therapy recommendations: none  Disposition:  return home  Do not return to work x 1 month. Return to work pending follow up appt. Letter given to pt.  Atrial Fibrillation, new dx  ICD interrogation positive for atrial fibrillation   CHA2DS2-VASc Score = at least 5, ?2 oral anticoagulation recommended  Age in Years:  <65  0      Sex:  Male   0    Hypertension History:  yes   +1     Diabetes Mellitus:  yes   +1  Congestive Heart Failure History:  yes   +1  Vascular Disease History:  0   Stroke/TIA/Thromboembolism History:  yes   +2  New Eliquis for stroke prevention  New insurance today confirmed $8.50/Rx   Cardiomyopathy   Long standing CHF   EF 15-20% in 10/2016  s/p ICD in 12/2016  EF 20-25% this admission  Likely the cause of recurrent stroke  Anticoagulation:   coumadin changed to Eliquis given new AF  Hx of stroke  08/2015 - left sided weakness, difficulty walking - MRI left SCA infarct - CUS neg, CTA h/n unremarkable - on plavix and statin  Hypertension  Stable  Long-term BP goal normotensive  Hyperlipidemia  Lipid lowering medication PTA:  pravastatin  LDL 109, goal < 70  Current lipid lowering medication:lipitor 40  Continue statin at discharge  Diabetes  HgbA1c 6.4, goal < 7.0  Controlled  Discontinue glipizide.  Patient refuses to take this medication.  Tobacco abuse  Current smoker  Smoking cessation counseling provided  Pt is willing to quit  Other Stroke Risk Factors  Morbid Obesity, Body mass index is 40.69 kg/m., recommend weight loss, diet and exercise as appropriate   OSA on CPAP  Other Active Problems  Seizure - 10/2012 - was on tegretol - now no AEDs  The patient is anxious for discharge.   DISCHARGE EXAM Blood pressure 116/83, pulse 76, temperature 97.7 F (36.5 C), temperature source Oral, resp. rate 20, height 6' (1.829 m), weight (!) 136.1 kg (300 lb 0.7 oz), SpO2 95 %. General - Well nourished, well developed, in no apparent distress. Cardiovascular - Regular rate and rhythm with no murmur.  Mental Status -  Level of arousal and orientation to time, place, and person were intact. Language  including expression, naming, repetition, comprehension was assessed and found intact. Fund of Knowledge was assessed and was intact.  Cranial Nerves II - XII - II - Visual field intact OU. III, IV, VI - Extraocular movements intact. V - Facial sensation intact bilaterally. VII - Facial movement intact bilaterally. VIII - Hearing & vestibular intact bilaterally. X - Palate elevates symmetrically, mild dysarthria. XI - Chin turning & shoulder shrug intact bilaterally. XII - Tongue protrusion intact.  Motor Strength - The patient's strength was normal in all extremities and pronator drift  was absent.  Bulk was normal and fasciculations were absent.   Motor Tone - Muscle tone was assessed at the neck and appendages and was normal.  Reflexes - The patient's reflexes were symmetrical in all extremities and he had no pathological reflexes.  Sensory - Light touch, temperature/pinprick were assessed and were symmetrical.    Coordination - The patient had normal movements in the right hand and foot with no ataxia or dysmetria. However, mild left UE and LE ataxia, chronic. Tremor was absent.  Gait and Station - steady. Unable to balance on one foot.  Discharge Diet   Carb Modified thin liquids  DISCHARGE PLAN  Disposition:  Return home  Eliquis (apixaban) daily for secondary stroke prevention.  Ongoing risk factor control by Primary Care Physician at time of discharge  Follow-up Olin Hauser, DO in 2 weeks.  Follow-up in [facility] in 4 weeks, office to schedule an appointment.   45 minutes were spent preparing discharge.  Burnetta Sabin, MSN, APRN, ANVP-BC, AGPCNP-BC Advanced Practice Stroke Nurse [place] for Schedule & Pager information 01/04/2018 4:30 PM    ATTENDING NOTE: I reviewed above note and agree with the assessment and plan. I have made any additions or clarifications directly to the above note. Pt was seen and examined.   Pt neuro stable. No complains. Was on coumadin with lovenox bridging. However, ICD interrogation showed afib. Therefore AC changed to eliquis. Pt will be d/c with outpt cardiology folllowup. Follow up with neurology in 4 weeks.   Rosalin Hawking, MD PhD Stroke Neurology 01/04/2018 10:51 PM

## 2018-01-05 ENCOUNTER — Telehealth: Payer: Self-pay | Admitting: Pharmacy Technician

## 2018-01-05 NOTE — Telephone Encounter (Signed)
Medicare Part D effective 01/04/18.  No longer eligible for medication assistance at [facility].  Patient notified.  Sherilyn Dacosta Care Manager [facility]

## 2018-01-12 LAB — CUP PACEART REMOTE DEVICE CHECK
Date Time Interrogation Session: 20190620073525
HighPow Impedance: 71 Ohm
Implantable Lead Implant Date: 20180604
Implantable Lead Serial Number: 433424
Implantable Pulse Generator Implant Date: 20180604
Lead Channel Pacing Threshold Amplitude: 1.75 V
Lead Channel Pacing Threshold Pulse Width: 0.4 ms
Lead Channel Sensing Intrinsic Amplitude: 12.375 mV
Lead Channel Setting Sensing Sensitivity: 0.3 mV
MDC IDC LEAD LOCATION: 753860
MDC IDC MSMT BATTERY REMAINING LONGEVITY: 133 mo
MDC IDC MSMT BATTERY VOLTAGE: 3.04 V
MDC IDC MSMT LEADCHNL RV IMPEDANCE VALUE: 342 Ohm
MDC IDC MSMT LEADCHNL RV IMPEDANCE VALUE: 361 Ohm
MDC IDC MSMT LEADCHNL RV SENSING INTR AMPL: 12.375 mV
MDC IDC SET LEADCHNL RV PACING AMPLITUDE: 4 V
MDC IDC SET LEADCHNL RV PACING PULSEWIDTH: 0.4 ms
MDC IDC STAT BRADY RV PERCENT PACED: 0.04 %

## 2018-01-18 ENCOUNTER — Ambulatory Visit (INDEPENDENT_AMBULATORY_CARE_PROVIDER_SITE_OTHER): Payer: Medicare Other | Admitting: Family Medicine

## 2018-01-18 ENCOUNTER — Encounter: Payer: Self-pay | Admitting: Family Medicine

## 2018-01-18 VITALS — BP 110/84 | HR 75 | Temp 98.6°F | Resp 16 | Ht 72.0 in | Wt 301.8 lb

## 2018-01-18 DIAGNOSIS — E1121 Type 2 diabetes mellitus with diabetic nephropathy: Secondary | ICD-10-CM

## 2018-01-18 DIAGNOSIS — N182 Chronic kidney disease, stage 2 (mild): Secondary | ICD-10-CM

## 2018-01-18 DIAGNOSIS — Z8673 Personal history of transient ischemic attack (TIA), and cerebral infarction without residual deficits: Secondary | ICD-10-CM | POA: Diagnosis not present

## 2018-01-18 DIAGNOSIS — I4891 Unspecified atrial fibrillation: Secondary | ICD-10-CM

## 2018-01-18 LAB — POCT UA - MICROALBUMIN: MICROALBUMIN (UR) POC: 20 mg/L

## 2018-01-18 MED ORDER — ACCU-CHEK SOFTCLIX LANCETS MISC: 4 refills | Status: AC

## 2018-01-18 MED ORDER — GLIMEPIRIDE 4 MG PO TABS
4.0000 mg | ORAL_TABLET | Freq: Every day | ORAL | 3 refills | Status: DC
Start: 2018-01-18 — End: 2018-02-11

## 2018-01-18 MED ORDER — ACCU-CHEK AVIVA PLUS W/DEVICE KIT: PACK | 0 refills | Status: AC

## 2018-01-18 MED ORDER — ACCU-CHEK AVIVA PLUS VI STRP: ORAL_STRIP | 4 refills | Status: AC

## 2018-01-18 NOTE — Progress Notes (Signed)
Subjective:    Patient ID: Kent Kindred., male    DOB: Jan 27, 1971, 47 y.o.   MRN: [id number]  Kent Burkett. is a 47 y.o. male presenting on 01/18/2018 for Hospitalization Follow-up (CVA)   [place]  Initial ED: [place] > transferred to [facility] due to CVA require tPA Hospital/Location: [facility] Date of Admission: 01/01/18 Date of Discharge: 01/04/18 Transitions of care telephone call: Not completed.  Reason for Admission: Acute Stroke Primary (+Secondary) Diagnosis: Acute right superior cerebellar small infarct, likely embolic due to CHF w/ low EF  FOLLOW-UP - Hospital H&P and Discharge Summary have been reviewed - Patient presents today about 14 days after recent hospitalization. Brief summary of recent course, patient had symptoms of acute onset dysarthria and weakness on 01/01/18 sudden onset after arriving home, he describes that he tried to answer phone and he couldn't form words well, and he got home and felt very unsteady and loss of balance. Went to hospital [facility] ED and was eval by tele-neurology, given IV TPA and coordinated ED to ED transfer to [facility], where he was admitted. He had CTA that did not identify large vessel occlusion. Followed by Neurology in hospital. He was unable to get MRI due to defibrillator likely not compatible. Testing with TTE Echo showed some mild improvement in LVEF to 20-25%, and thought likely factor in thrombus formation for presumed embolic stroke. ICD interrogation confirmed positive Atrial Fibrillation. Labs showed LDL 109 and A1c 6.4, he was on Plavix '75mg'$  daily then transitioned to ASA '325mg'$  then to anticoagulation for stroke reduction. He was arranged follow-up outpatient [facility] 02/10/18 to release him back to work if indicated.  - Today reports overall has done well after discharge. Symptoms of lightheadedness and some dizziness have significantly improved only has mild residual symptoms at times. His  speech and strength are back to normal now. He states previous stroke was Left cerebellum and now this was R cerebellum.  - His mother passed recently from cancer. He has been trying to cope more with alcohol and smoking, now he has quit. - Not requiring to use cane. He gets a little "off balance" if walks too far - He has history of ED, asking if Sildenafil is okay to take with current meds - He is due for apt with Cardiology, states now he is on medicare, and with heart rhythm abnormality they asked him to follow-up  - New medications on discharge: Eliquis '5mg'$  BID - Changes to current meds on discharge: Discontinue Plavix '75mg'$   Denies any acute headache, vision change, new weakness, numbness tingling, slurred speech, fall or injury, chest pain or dyspnea.  Additional history:  CHRONIC DM, Type 2: Reports no concerns. Last A1c in hospital 6.4 Needs new glucometer Meds: Glipizide '10mg'$  not every day w/ meal - he was checking sugar to determine if needed medicine Reports good compliance. Tolerating well w/o side-effects Currently not on ACEi / ARB - but taking Entresto Lifestyle: - Diet (improved diet low sugar low carb foods, more water)  - Exercise (improving) Denies hypoglycemia   Depression screen [facility] 2/9 12/09/2017 11/23/2017 04/03/2017  Decreased Interest 0 0 0  Down, Depressed, Hopeless 0 0 0  PHQ - 2 Score 0 0 0  Altered sleeping - - -  Tired, decreased energy - - -  Change in appetite - - -  Feeling bad or failure about yourself  - - -  Trouble concentrating - - -  Moving slowly or fidgety/restless - - -  Suicidal thoughts - - -  PHQ-9 Score - - -  Difficult doing work/chores - - -    Social History   Tobacco Use  . Smoking status: Former Smoker    Types: Cigars    Last attempt to quit: 01/02/2018    Years since quitting: 0.0  . Smokeless tobacco: Former Systems developer  . Tobacco comment: smokes ~10 black and milds per week  Substance Use Topics  . Alcohol use: Yes     Comment: 8-9 beers a month per pt  . Drug use: No    Review of Systems Per HPI unless specifically indicated above     Objective:    BP 110/84 Comment: manual  Pulse 75   Temp 98.6 F (37 C) (Oral)   Resp 16   Ht 6' (1.829 m)   Wt (!) 301 lb 12.8 oz (136.9 kg)   BMI 40.93 kg/m   Wt Readings from Last 3 Encounters:  01/18/18 (!) 301 lb 12.8 oz (136.9 kg)  01/01/18 (!) 300 lb 0.7 oz (136.1 kg)  01/01/18 283 lb (128.4 kg)    Physical Exam  Constitutional: He is oriented to person, place, and time. He appears well-developed and well-nourished. No distress.  Well-appearing, comfortable, cooperative, morbidly obese  HENT:  Head: Normocephalic and atraumatic.  Mouth/Throat: Oropharynx is clear and moist.  Eyes: Conjunctivae are normal. Right eye exhibits no discharge. Left eye exhibits no discharge.  Neck: Normal range of motion. Neck supple.  Cardiovascular: Normal rate, normal heart sounds and intact distal pulses.  No murmur heard. Irregularly irregular  Pulmonary/Chest: Effort normal and breath sounds normal. No respiratory distress. He has no wheezes. He has no rales.  Musculoskeletal: Normal range of motion. He exhibits no edema.  Has cane but not using for ambulation, currently able to stand from seated and ambulate without difficulty  Neurological: He is alert and oriented to person, place, and time.  Skin: Skin is warm and dry. No rash noted. He is not diaphoretic. No erythema.  Psychiatric: He has a normal mood and affect. His behavior is normal.  Well groomed, good eye contact, normal speech and thoughts  Nursing note and vitals reviewed.    Diabetic Foot Exam - Simple   Simple Foot Form Diabetic Foot exam was performed with the following findings:  Yes 01/18/2018  9:06 AM  Visual Inspection No deformities, no ulcerations, no other skin breakdown bilaterally:  Yes Sensation Testing See comments:  Yes Pulse Check Posterior Tibialis and Dorsalis pulse intact  bilaterally:  Yes Comments Bilateral reduced monofilament testing great toe and forefoot. Otherwise mostly intact to monofilament sensation. Without significant callus formation.     Results for orders placed or performed in visit on 01/18/18  POCT UA - Microalbumin  Result Value Ref Range   Microalbumin Ur, POC 20 mg/L   Recent Labs    01/02/18 0301  HGBA1C 6.4*      Assessment & Plan:   Problem List Items Addressed This Visit    Atrial fibrillation ([place])    Uncertain if persistent vs paroxysmal Some ectopy on exam today, previous interrogation in hospital showed AFib Thought to be related to presumed embolic CVAs On Anticoagulation now with Eliquis Follow-up with Neuro and Cardiology      CKD (chronic kidney disease), stage II    Improved Cr, now closer to CKD-II Stable Cr trend Secondary to HTN, DM and other factors On diuretic for CHF  History of stroke involving cerebellum - Primary    Clinically stable and near baseline again from recent acute R cerebellar CVA s/p tPA treatment at hospital Prior CVA L cerebellar, has had some chronic balance issues Now failed Plavix, on Eliquis anticoagulation On statin, Entresto Follow-up w/ [facility] - concern of Atrial Fib and reduced LVEF contributing to embolic CVAs      Type 2 diabetes mellitus with nephropathy (HCC)    Currently controlled DM with A1c 6.4 (improved from prior 7 to 8) Complications - CKD-II to III with microalbuminuria, diabetic neuropathy mild, other including hyperlipidemia, morbid obesity, OSA - increases risk of future cardiovascular complications  Plan:  1. Reviewed diabetes management and complications - Agree may need less sulfonylurea and may come off this med in future d/t hypoglycemia risk - Discontinue fast acting Glipizide '10mg'$  PRN - New rx Glimepiride '4mg'$  daily wc - may monitor sugar and in future scale back to fewer dosing or half tab if needed, caution hypoglycemia -  future benefit from GLP1 for cardiovascular risk reduction and weight loss will review 2. Encourage improved lifestyle - low carb, low sugar diet, reduce portion size, continue improving regular exercise 3. Check CBG, bring log to next visit for review - new Accu-chek glucometer ordered 4. Continue anticoagulation, Entresto, Statin 5. DM Foot exam done today / Advised to schedule DM ophtho exam, send record - handout given 6. Follow-up 3 months DM A1c  Check urine microalbumin today mild positive 20       Relevant Medications   glimepiride (AMARYL) 4 MG tablet   Blood Glucose Monitoring Suppl (ACCU-CHEK AVIVA PLUS) w/Device KIT   ACCU-CHEK AVIVA PLUS test strip   ACCU-CHEK SOFTCLIX LANCETS lancets   Other Relevant Orders   POCT UA - Microalbumin (Completed)      Meds ordered this encounter  Medications  . glimepiride (AMARYL) 4 MG tablet    Sig: Take 1 tablet (4 mg total) by mouth daily before breakfast. Consider reduce to half pill ('2mg'$ )    Dispense:  90 tablet    Refill:  3    Discontinued Glipizide  . Blood Glucose Monitoring Suppl (ACCU-CHEK AVIVA PLUS) w/Device KIT    Sig: Check blood sugar up to 1 x daily    Dispense:  1 kit    Refill:  0  . ACCU-CHEK AVIVA PLUS test strip    Sig: Check blood sugar up to 1 x daily    Dispense:  100 each    Refill:  4  . ACCU-CHEK SOFTCLIX LANCETS lancets    Sig: Check blood sugar up to 1 x daily    Dispense:  100 each    Refill:  4   I have reviewed the discharge medication list, and have reconciled the current and discharge medications today.   Current Outpatient Medications:  .  apixaban (ELIQUIS) 5 MG TABS tablet, Take 1 tablet (5 mg total) by mouth 2 (two) times daily., Disp: 60 tablet, Rfl: 2 .  blood glucose meter kit and supplies KIT, Dispense based on patient and insurance preference. Check blood glucose once daily., Disp: 1 each, Rfl: 0 .  ENTRESTO 49-51 MG, take 1 tablet by mouth twice a day, Disp: 60 tablet, Rfl: 5 .   furosemide (LASIX) 40 MG tablet, Take 1 tablet (40 mg total) by mouth daily. Take 20 mg tablet every evening. (Patient taking differently: Take 20-40 mg by mouth See admin instructions. '40mg'$  in the morning and '20mg'$  in the afternoon),  Disp: 135 tablet, Rfl: 3 .  metoprolol succinate (TOPROL-XL) 25 MG 24 hr tablet, Take 1 tablet (25 mg total) by mouth daily., Disp: 30 tablet, Rfl: 5 .  pravastatin (PRAVACHOL) 40 MG tablet, Take 2 tablets (80 mg total) by mouth daily., Disp: 60 tablet, Rfl: 2 .  sildenafil (REVATIO) 20 MG tablet, Take 5 tablets (100 mg total) by mouth as needed. Take about 30 min prior to sex. Caution stop taking if chest pain. Do not take with Nitro., Disp: 60 tablet, Rfl: 5 .  spironolactone (ALDACTONE) 25 MG tablet, Take 0.5 tablets (12.5 mg total) by mouth daily., Disp: 45 tablet, Rfl: 3 .  ACCU-CHEK AVIVA PLUS test strip, Check blood sugar up to 1 x daily, Disp: 100 each, Rfl: 4 .  ACCU-CHEK SOFTCLIX LANCETS lancets, Check blood sugar up to 1 x daily, Disp: 100 each, Rfl: 4 .  Blood Glucose Monitoring Suppl (ACCU-CHEK AVIVA PLUS) w/Device KIT, Check blood sugar up to 1 x daily, Disp: 1 kit, Rfl: 0 .  glimepiride (AMARYL) 4 MG tablet, Take 1 tablet (4 mg total) by mouth daily before breakfast. Consider reduce to half pill ('2mg'$ ), Disp: 90 tablet, Rfl: 3    Follow up plan: Return in about 3 months (around 04/20/2018) for DM A1c, history of stroke.  Nobie Putnam, [facility] 01/18/2018, 1:21 PM

## 2018-01-18 NOTE — Assessment & Plan Note (Signed)
Uncertain if persistent vs paroxysmal Some ectopy on exam today, previous interrogation in hospital showed AFib Thought to be related to presumed embolic CVAs On Anticoagulation now with Eliquis Follow-up with Neuro and Cardiology

## 2018-01-18 NOTE — Assessment & Plan Note (Signed)
Improved Cr, now closer to CKD-II Stable Cr trend Secondary to HTN, DM and other factors On diuretic for CHF

## 2018-01-18 NOTE — Assessment & Plan Note (Addendum)
Clinically stable and near baseline again from recent acute R cerebellar CVA s/p tPA treatment at hospital Prior CVA L cerebellar, has had some chronic balance issues Now failed Plavix, on Eliquis anticoagulation On statin, Entresto Follow-up w/ [facility] - concern of Atrial Fib and reduced LVEF contributing to embolic CVAs

## 2018-01-18 NOTE — Assessment & Plan Note (Addendum)
Currently controlled DM with A1c 6.4 (improved from prior 7 to 8) Complications - CKD-II to III with microalbuminuria, diabetic neuropathy mild, other including hyperlipidemia, morbid obesity, OSA - increases risk of future cardiovascular complications  Plan:  1. Reviewed diabetes management and complications - Agree may need less sulfonylurea and may come off this med in future d/t hypoglycemia risk - Discontinue fast acting Glipizide 10mg  PRN - New rx Glimepiride 4mg  daily wc - may monitor sugar and in future scale back to fewer dosing or half tab if needed, caution hypoglycemia - future benefit from GLP1 for cardiovascular risk reduction and weight loss will review 2. Encourage improved lifestyle - low carb, low sugar diet, reduce portion size, continue improving regular exercise 3. Check CBG, bring log to next visit for review - new Accu-chek glucometer ordered 4. Continue anticoagulation, Entresto, Statin 5. DM Foot exam done today / Advised to schedule DM ophtho exam, send record - handout given 6. Follow-up 3 months DM A1c  Check urine microalbumin today mild positive 20

## 2018-01-18 NOTE — Patient Instructions (Addendum)
Thank you for coming to the office today.  Discontinue Glipizide - start new med Glimepiride 4mg  daily with food - if need can reduce dose to half pill or only take intermittent  New glucometer rx written  Follow-up with Neurology and Cardiology  Your provider would like to you have your annual eye exam. Please contact your current eye doctor or here are some good options for you to contact.   [facility]   Address: [address] [place], [place] [address] Phone: [phone]  Website: [email or web address]   [facility] [address], [place], [place] [address] Phone: [phone] [email or web address]  [facility]  Address: [address] [place], [place], [place] [address] Phone: [phone]   [facility] [address] [place], [place] [address] Phone: [phone]  [facility] Address: [address] [place], [place], [place] [address]  Phone: [phone]   Please schedule a Follow-up Appointment to: Return in about 3 months (around 04/20/2018) for DM A1c, history of stroke.  If you have any other questions or concerns, please feel free to call the office or send a message through MyChart. You may also schedule an earlier appointment if necessary.  Additionally, you may be receiving a survey about your experience at our office within a few days to 1 week by e-mail or mail. We value your feedback.  Saralyn Pilar, DO [facility], [place]

## 2018-01-20 ENCOUNTER — Other Ambulatory Visit: Payer: Self-pay | Admitting: Family Medicine

## 2018-01-20 DIAGNOSIS — I5022 Chronic systolic (congestive) heart failure: Secondary | ICD-10-CM

## 2018-01-20 MED ORDER — FUROSEMIDE 40 MG PO TABS: ORAL_TABLET | ORAL | 3 refills | Status: DC

## 2018-01-20 NOTE — Telephone Encounter (Signed)
Pt is using [facility] [address] pharmacy now.  He needs a refill on furosemide and asked to have his medication list faxed to [facility].  His call back number is [phone]

## 2018-01-20 NOTE — Progress Notes (Signed)
[facility] Pulmonary Medicine Consultation      Assessment and Plan:  Obstructive Sleep Apnea  -Sleep study positive for OSA, requiring Bipap of 22/18 using large FP Simplus FF mask.  --Continue using bipap every night.   Obesity. -BMI equals 44. -Can contribute to obstructive sleep apnea, discussed importance of weight loss.  Ischemic cardiomyopathy with systolic CHF.  Essential hypertension. Stroke. -Above conditions can be contributed to by obstructive sleep apnea, therefore, it is important to  adequately treat the patient's obstructive sleep apnea.   Date: 01/20/2018  MRN# [id number] Kent Stanley. 07-08-1970  Kent Stanley. is a 47 y.o. old male seen in consultation for chief complaint of:    Chief Complaint  Patient presents with  . Sleep Apnea    Bipap f/u    HPI:   The patient is a 47 year old male with a history of cerebellar stroke, cardiomyopathy, substance abuse, noncompliance with therapy, morbid obesity, diabetes mellitus. Since his last visit he has had another stroke, and was found to have atrial fib.  He has been using his friends CPAP and not the bipap we had ordered. He switched to the ordered  bipap about 2 weeks ago, he feels that he is doing well with it, but has trouble tolerating the mask.   **Bipap download 12/23/17-01/21/18>> raw data personally reviewed, uses greater than 4 hours is 11/30 days.  Average usage on days used is 5 hours 15 minutes.  Setting is 22/18.  Residual AHI 0.1.  Overall this shows poor compliance with BiPAP with excellent control of obstructive sleep apnea. **echocardiogram 03/28/16>>EF equals 20% **chest x-ray  03/27/16>> Mild pulmonary edema, cardiomegaly.  10/16/16. 2 month compliance download personally reviewed; average usage 5 hours 8 minutes. Over the past 2 months; compliance is 67%, average usage equals 5 hours and 8 minutes, however, this is significantly improved over the last 1 month. current settings 22/18.  Residual AHI equals 2.1 -Over the last 30 days compliance of greater than 4 hours equals 87%; average usage is 6 hours and 2 minutes. Residual AHI is 1.3.  Medication:   Reviewed.     Allergies:  Tomato  Review of Systems:  Constitutional: Feels well. Cardiovascular: No chest pain.  Pulmonary: Denies dyspnea.   The remainder of systems were reviewed and were found to be negative other than what is documented in the HPI.    Physical Examination:   VS: BP 128/88 (BP Location: Left Arm, Cuff Size: Large)   Pulse 82   Ht 6' (1.829 m)   Wt (!) 303 lb (137.4 kg)   HC 16" (40.6 cm)   SpO2 96%   BMI 41.09 kg/m   General Appearance: No distress  Neuro:without focal findings, mental status, speech normal, alert and oriented HEENT: PERRLA, EOM intact Pulmonary: No wheezing, No rales  CardiovascularNormal S1,S2.  No m/r/g.  Abdomen: Benign, Soft, non-tender, No masses Renal:  No costovertebral tenderness  GU:  No performed at this time. Endoc: No evident thyromegaly, no signs of acromegaly or Cushing features Skin:   warm, no rashes, no ecchymosis  Extremities: normal, no cyanosis, clubbing.      LABORATORY PANEL:   CBC No results for input(s): WBC, HGB, HCT, PLT in the last 168 hours. ------------------------------------------------------------------------------------------------------------------  Chemistries  No results for input(s): NA, K, CL, CO2, GLUCOSE, BUN, CREATININE, CALCIUM, MG, AST, ALT, ALKPHOS, BILITOT in the last 168 hours.  Invalid input(s): GFRCGP ------------------------------------------------------------------------------------------------------------------  Cardiac Enzymes No results for input(s): TROPONINI in the last  168 hours. ------------------------------------------------------------  RADIOLOGY:  No results found.     Thank  you for the consultation and for allowing [facility] to assist in the care of your  patient. Our recommendations are noted above.  Please contact us if we can be of further service.   Wells Guiles, M.D., F.C.C.P.  Board Certified in Internal Medicine, Pulmonary Medicine, Critical Care Medicine, and Sleep Medicine.   Pulmonary and Critical Care Office Number: [phone]   01/20/2018

## 2018-01-21 ENCOUNTER — Encounter: Payer: Self-pay | Admitting: Internal Medicine

## 2018-01-25 ENCOUNTER — Ambulatory Visit (INDEPENDENT_AMBULATORY_CARE_PROVIDER_SITE_OTHER): Payer: Medicare Other | Admitting: Internal Medicine

## 2018-01-25 ENCOUNTER — Encounter: Payer: Self-pay | Admitting: Internal Medicine

## 2018-01-25 ENCOUNTER — Other Ambulatory Visit: Payer: Self-pay | Admitting: Family

## 2018-01-25 VITALS — BP 128/88 | HR 82 | Ht 72.0 in | Wt 303.0 lb

## 2018-01-25 DIAGNOSIS — G4733 Obstructive sleep apnea (adult) (pediatric): Secondary | ICD-10-CM

## 2018-01-25 DIAGNOSIS — I5022 Chronic systolic (congestive) heart failure: Secondary | ICD-10-CM

## 2018-01-25 DIAGNOSIS — I255 Ischemic cardiomyopathy: Secondary | ICD-10-CM

## 2018-01-25 MED ORDER — FUROSEMIDE 40 MG PO TABS: ORAL_TABLET | ORAL | 3 refills | Status: DC

## 2018-01-25 NOTE — Addendum Note (Signed)
Addended by: Meyer Cory R on: 01/25/2018 11:00 AM   Modules accepted: Orders

## 2018-01-25 NOTE — Patient Instructions (Addendum)
Continue using bipap every night.  Will refer to mask fitting clinic.

## 2018-01-27 ENCOUNTER — Other Ambulatory Visit (HOSPITAL_BASED_OUTPATIENT_CLINIC_OR_DEPARTMENT_OTHER): Payer: Medicare Other

## 2018-01-28 ENCOUNTER — Ambulatory Visit: Payer: Medicare Other | Attending: Family | Admitting: Family

## 2018-01-28 ENCOUNTER — Encounter: Payer: Self-pay | Admitting: Family

## 2018-01-28 VITALS — BP 120/88 | HR 89 | Resp 18 | Ht 72.0 in | Wt 304.5 lb

## 2018-01-28 DIAGNOSIS — I130 Hypertensive heart and chronic kidney disease with heart failure and stage 1 through stage 4 chronic kidney disease, or unspecified chronic kidney disease: Secondary | ICD-10-CM | POA: Diagnosis not present

## 2018-01-28 DIAGNOSIS — I5022 Chronic systolic (congestive) heart failure: Secondary | ICD-10-CM

## 2018-01-28 DIAGNOSIS — E785 Hyperlipidemia, unspecified: Secondary | ICD-10-CM | POA: Diagnosis not present

## 2018-01-28 DIAGNOSIS — E1121 Type 2 diabetes mellitus with diabetic nephropathy: Secondary | ICD-10-CM

## 2018-01-28 DIAGNOSIS — Z950 Presence of cardiac pacemaker: Secondary | ICD-10-CM | POA: Insufficient documentation

## 2018-01-28 DIAGNOSIS — Z8673 Personal history of transient ischemic attack (TIA), and cerebral infarction without residual deficits: Secondary | ICD-10-CM | POA: Insufficient documentation

## 2018-01-28 DIAGNOSIS — N182 Chronic kidney disease, stage 2 (mild): Secondary | ICD-10-CM | POA: Diagnosis not present

## 2018-01-28 DIAGNOSIS — I2510 Atherosclerotic heart disease of native coronary artery without angina pectoris: Secondary | ICD-10-CM | POA: Insufficient documentation

## 2018-01-28 DIAGNOSIS — Z87891 Personal history of nicotine dependence: Secondary | ICD-10-CM | POA: Insufficient documentation

## 2018-01-28 DIAGNOSIS — I10 Essential (primary) hypertension: Secondary | ICD-10-CM

## 2018-01-28 DIAGNOSIS — Z79899 Other long term (current) drug therapy: Secondary | ICD-10-CM | POA: Diagnosis not present

## 2018-01-28 DIAGNOSIS — F329 Major depressive disorder, single episode, unspecified: Secondary | ICD-10-CM | POA: Diagnosis not present

## 2018-01-28 DIAGNOSIS — G40909 Epilepsy, unspecified, not intractable, without status epilepticus: Secondary | ICD-10-CM | POA: Diagnosis not present

## 2018-01-28 DIAGNOSIS — E1122 Type 2 diabetes mellitus with diabetic chronic kidney disease: Secondary | ICD-10-CM | POA: Insufficient documentation

## 2018-01-28 DIAGNOSIS — Z7984 Long term (current) use of oral hypoglycemic drugs: Secondary | ICD-10-CM | POA: Insufficient documentation

## 2018-01-28 DIAGNOSIS — F17200 Nicotine dependence, unspecified, uncomplicated: Secondary | ICD-10-CM

## 2018-01-28 NOTE — Progress Notes (Signed)
Patient ID: Kent Stanley., male    DOB: 21-Aug-1970, 47 y.o.   MRN: [id number]  HPI  Kent Stanley is a 47 y/o male with a history of stroke, CKD stage 2, CAD, hyperlipidemia, HTN, morbid obesity, polysubstance abuse in the past, prior tobacco use, seizures, DM, ICD implantation and chronic heart failure.  Echo done 01/02/18 reviewed and showed an EF of 20-25% along with moderate Kent, trivial TR and PA pressure of 22 mm Hg. Echo done 10/24/16 which showed an EF of 15-20%. EF has declined from previous echo which was done 03/28/16 which showed an EF of 20-25% without regurgitation. Last catheterization was done March 2016.  Admitted 01/01/18 due to stroke. Received IV TPA. Discharged after 3 days. Was in the ED 01/01/18 due to a stroke where he was transferred to [facility] for further treatment.   He presents today for a follow-up visit with a chief complaint of intermittent chest pain. He says that this has been chronic in nature having been present for several years and hasn't changed in severity or pattern. He has associated worsening depression due to the recent death of his mom along with not being cleared to go back to work yet after his recent stroke. He denies any difficulty sleeping, abdominal distention, palpitations, pedal edema, shortness of breath, cough, dizziness or weight gain. Was experiencing light-headedness prior to his most recent stroke but that has since resolved.   Past Medical History:  Diagnosis Date  . Chronic systolic heart failure ([place])    a. 10/2012 Echo: EF <20%, mod to sev dil LV, diast dysfxn, mildly dil LA/RA, mod to severe Kent/TR.  [name] Coronary artery disease, non-occlusive    a. 09/2014 Cath: minor luminal irregularities with severely dilated left ventricle. LVEDP 30.  [name] H/O noncompliance with medical treatment, presenting hazards to health   . History of traumatic injury of head   . Hyperlipidemia   . Hypertension   . Morbid obesity ([place])   . NICM (nonischemic  cardiomyopathy) ([place])    a. 10/2012 Echo: EF <20%.  . Polysubstance abuse ([name])    a. cocaine, tobacco, THC, ETOH  . Seizure disorder ([name])   . Stroke [facility])    Past Surgical History:  Procedure Laterality Date  . CARDIAC CATHETERIZATION     [facility]  . ICD IMPLANT N/A 12/08/2016   Procedure: ICD Implant;  Surgeon: Deboraha Sprang, MD;  Location: [place] [facility];  Service: Cardiovascular;  Laterality: N/A;   Family History  Problem Relation Age of Onset  . Diabetes Mother   . Brain cancer Mother   . Heart attack Mother   . Sleep apnea Mother   . Heart failure Mother   . Hypertension Father   . Diabetes Father   . Heart disease Father   . Hyperlipidemia Father   . Heart attack Father   . Heart failure Father   . Sleep apnea Father   . Sleep apnea Sister   . Sleep apnea Brother   . Heart failure Maternal Aunt    Social History   Tobacco Use  . Smoking status: Former Smoker    Types: Cigars    Last attempt to quit: 01/02/2018    Years since quitting: 0.0  . Smokeless tobacco: Former Systems developer  . Tobacco comment: smokes ~10 black and milds per week  Substance Use Topics  . Alcohol use: Yes    Comment: 8-9 beers a month per pt   Allergies  Allergen Reactions  .  Tomato Rash   Prior to Admission medications   Medication Sig Start Date End Date Taking? Authorizing Provider  ACCU-CHEK AVIVA PLUS test strip Check blood sugar up to 1 x daily 01/18/18  Yes Karamalegos, [name], DO  ACCU-CHEK SOFTCLIX LANCETS lancets Check blood sugar up to 1 x daily 01/18/18  Yes Karamalegos, [name], DO  apixaban (ELIQUIS) 5 MG TABS tablet Take 1 tablet (5 mg total) by mouth 2 (two) times daily. 01/04/18  Yes Rosalin Hawking, MD  blood glucose meter kit and supplies KIT Dispense based on patient and insurance preference. Check blood glucose once daily. 08/21/15  Yes [name], [name], NP  Blood Glucose Monitoring Suppl (ACCU-CHEK AVIVA PLUS) w/Device KIT Check blood sugar up to 1 x daily 01/18/18  Yes  Olin Hauser, DO  ENTRESTO 49-51 MG take 1 tablet by mouth twice a day 11/25/16  Yes [name], FNP  furosemide (LASIX) 40 MG tablet Take '40mg'$  in morning. Take half tab for dose 20 mg tablet every evening. 01/25/18  Yes Hackney, [name], FNP  glimepiride (AMARYL) 4 MG tablet Take 1 tablet (4 mg total) by mouth daily before breakfast. Consider reduce to half pill ('2mg'$ ) Patient taking differently: Take 4 mg by mouth daily before breakfast. '4mg'$  every other day alternate with '2mg'$  every other day 01/18/18  Yes Karamalegos, [name], DO  metoprolol succinate (TOPROL-XL) 25 MG 24 hr tablet Take 1 tablet (25 mg total) by mouth daily. 07/09/17  Yes Karamalegos, [name], DO  pravastatin (PRAVACHOL) 40 MG tablet Take 2 tablets (80 mg total) by mouth daily. 11/23/17  Yes Karamalegos, [name], DO  sildenafil (REVATIO) 20 MG tablet Take 5 tablets (100 mg total) by mouth as needed. Take about 30 min prior to sex. Caution stop taking if chest pain. Do not take with Nitro. 11/23/17  Yes Karamalegos, [name], DO  spironolactone (ALDACTONE) 25 MG tablet Take 0.5 tablets (12.5 mg total) by mouth daily. 04/23/17  Yes Karamalegos, [name], DO    Review of Systems  Constitutional: Negative for appetite change and fatigue.  HENT: Negative for congestion, postnasal drip and sore throat.   Eyes: Negative.   Respiratory: Negative for cough, chest tightness, shortness of breath and wheezing.   Cardiovascular: Positive for chest pain ("chronic angina"). Negative for palpitations and leg swelling.  Gastrointestinal: Negative for abdominal distention and abdominal pain.  Endocrine: Negative.   Genitourinary: Negative.   Musculoskeletal: Negative for back pain and neck pain.  Skin: Negative.   Allergic/Immunologic: Negative.   Neurological: Negative for dizziness and light-headedness (before most recent stroke).  Hematological: Negative for adenopathy. Does not bruise/bleed easily.   Psychiatric/Behavioral: Positive for dysphoric mood (mom's death ~ 1 month ago). Negative for sleep disturbance (slept with CPAP last night for 9 hours). The patient is not nervous/anxious.    Vitals:   01/28/18 1108  BP: 120/88  Pulse: 89  Resp: 18  SpO2: 98%  Weight: (!) 304 lb 8 oz (138.1 kg)  Height: 6' (1.829 m)   Wt Readings from Last 3 Encounters:  01/28/18 (!) 304 lb 8 oz (138.1 kg)  01/25/18 (!) 303 lb (137.4 kg)  01/18/18 (!) 301 lb 12.8 oz (136.9 kg)   Lab Results  Component Value Date   CREATININE 1.30 (H) 01/04/2018   CREATININE 1.35 (H) 01/03/2018   CREATININE 1.45 (H) 01/02/2018    Physical Exam  Constitutional: He is oriented to person, place, and time. He appears well-developed and well-nourished.  HENT:  Head: Normocephalic and atraumatic.  Neck: Normal range of motion. Neck supple. No JVD present.  Cardiovascular: Normal rate and regular rhythm.  Pulmonary/Chest: Effort normal. He has no wheezes. He has no rales.  Abdominal: Soft. He exhibits no distension. There is no tenderness.  Musculoskeletal: He exhibits no edema or tenderness.  Neurological: He is alert and oriented to person, place, and time.  Skin: Skin is warm and dry.  Psychiatric: He has a normal mood and affect. His behavior is normal. Thought content normal.  Nursing note and vitals reviewed.   Assessment & Plan:  1: Chronic heart failure with reduced ejection fraction- - NYHA Class I - euvolemic today - continue weighing daily; To call for an overnight weight gain of >2 pounds or a weekly weight gain of >5 pounds. - weight down 12 pounds since he was last here September 2018 - Using Mrs. Dash and trying to follow a '2000mg'$  sodium diet - riding stationary bike  - Saw cardiologist Fletcher [name]) 09/23/16 - saw pulmonologist Ashby Dawes) 01/25/18 - saw EP [name]) 03/30/17 - BNP 02/12/16 was 403.8 - does not meet ReDS criteria due to BMI - got a new CPAP mask/ machine and slept great last night;  needs to get machine set with correct pressures and instructed him to call pulmonology for assistance with this - discussed increasing entresto but will hold off since he had his 3rd stroke ~ 1 month ago - could also titrate up metoprolol succinate in the future as well.   2: HTN- - BP looks good today - saw PCP Jerene Pitch) 01/18/18 - BMP 01/04/18 reviewed and showed sodium 136, potassium 4.2 and GFR >60  3: Diabetes- - glucose at home last night was 127 - A1c 01/03/28 was 6.4%  4: Tobacco use- - stopped smoking since most hospitalization; had resumed during his mother's illness/ subsequent death - drinks ~ 1 glass of red wine every few days  Patient did not bring his medications nor a list. Each medication was verbally reviewed with the patient and he was encouraged to bring the bottles to every visit to confirm accuracy of list.  Return in 2 months or sooner for any questions/problems before then.

## 2018-01-28 NOTE — Patient Instructions (Signed)
Continue weighing daily and call for an overnight weight gain of > 2 pounds or a weekly weight gain of >5 pounds. 

## 2018-02-09 NOTE — Progress Notes (Signed)
[facility] [address] [place]. [place] [address] [phone]       OFFICE FOLLOW UP NOTE  [name]. Date of Birth:  02/14/1971 Medical Record Number:  [id number]   Reason for Referral:  hospital stroke follow up  CHIEF COMPLAINT:  Chief Complaint  Patient presents with  . Follow-up    pt alone, rm 9. pt states that things are well and he is ready to go back to work.     HPI: Kent Stanley. is being seen today for initial visit in the office for right superior cerebellar small infarct likely embolic due to CHF with low EF with new dx of AF on 01/01/2018. History obtained from patient and chart review. Reviewed all radiology images and labs personally.  Kent Stanleyis a 47 y.o.malewith history of CHF with EF 15-20% 10/2016 s/p ICD, CKD, CAD, DM, HTN, HLD, obesity, OSA on CPAP, previous strokes, substance abuse, seizure in 4/2014who presented with left sided weakness s/p IV tPA as most recent stroke in 2017 at [facility] and then was transferred to [facility].  CT head was negative for acute abnormality but did show left renal SCA infarct.  CTA head and neck unremarkable for vessel occlusion.  Repeat CT head showed small acute right superior cerebellar infarct.  Unable to perform MRI due to ICD.  2D echo showed an EF of 20 to 25% without cardiac source of emboli identified.  ICD interrogation was positive for atrial fibrillation.  Patient was previously on Plavix 75 mg PTA and due to low EF it was initially recommended to start Coumadin but due to new diagnosis of atrial fibrillation recommended Eliquis.  LDL 109 and his patient was put on pravastatin PTA recommended to changes to Lipitor 40 mg daily.  A1c satisfactory at 6.4.  HTN stable and long-term BP goal normotensive.  Current smoker and smoking cessation counseling was provided.  Patient was discharged home in stable condition.  Patient is being seen today for hospital  follow-up and overall is doing well.  He continues to take Eliquis without side effects of bleeding or bruising.  Continues to take pravastatin without side effects myalgias.  Prior to the stroke, he was using his friend's old CPAP machine as he was in between insurance coverage but he has recently received a new machine and states he is feeling much better.  Blood pressures pressure today satisfactory 129/92 and this is monitored at home.  Patient does monitor glucose levels at home and these of also been satisfactory.  He does complain of right pinky and ring finger numbness at times. Informed patient possible irritation with ulnar nerve but as not debilitating and as these symptoms intermittent, recommended a wrist brace at night to see if this helps. Consider EMG/nerve conduction in the future.  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  He feels as though he is ready to return and has previously started driving without complication.  Patient has completely stopped smoking cigarettes at this time.  Denies new or worsening stroke/TIA symptoms.   ROS:   14 system review of systems performed and negative with exception of headache  PMH:  Past Medical History:  Diagnosis Date  . Chronic systolic heart failure ([place])    a. 10/2012 Echo: EF <20%, mod to sev dil LV, diast dysfxn, mildly dil LA/RA, mod to severe MR/TR.  [name] Coronary artery disease, non-occlusive  a. 09/2014 Cath: minor luminal irregularities with severely dilated left ventricle. LVEDP 30.  [name] H/O noncompliance with medical treatment, presenting hazards to health   . History of traumatic injury of head   . Hyperlipidemia   . Hypertension   . Morbid obesity ([place])   . NICM (nonischemic cardiomyopathy) ([place])    a. 10/2012 Echo: EF <20%.  . Polysubstance abuse ([place])    a. cocaine, tobacco, THC, ETOH  . Seizure disorder ([place])   . Stroke [facility])     PSH:  Past Surgical History:    Procedure Laterality Date  . CARDIAC CATHETERIZATION     [facility]  . ICD IMPLANT N/A 12/08/2016   Procedure: ICD Implant;  Surgeon: Deboraha Sprang, MD;  Location: [place] [facility];  Service: Cardiovascular;  Laterality: N/A;    Social History:  Social History   Socioeconomic History  . Marital status: Single    Spouse name: Not on file  . Number of children: Not on file  . Years of education: Not on file  . Highest education level: Not on file  Occupational History  . Not on file  Social Needs  . Financial resource strain: Not on file  . Food insecurity:    Worry: Not on file    Inability: Not on file  . Transportation needs:    Medical: Not on file    Non-medical: Not on file  Tobacco Use  . Smoking status: Former Smoker    Types: Cigars    Last attempt to quit: 01/02/2018    Years since quitting: 0.1  . Smokeless tobacco: Former Systems developer  . Tobacco comment: smokes ~10 black and milds per week  Substance and Sexual Activity  . Alcohol use: Yes    Comment: 8-9 beers a month per pt  . Drug use: No  . Sexual activity: Not Currently  Lifestyle  . Physical activity:    Days per week: Not on file    Minutes per session: Not on file  . Stress: Not on file  Relationships  . Social connections:    Talks on phone: Not on file    Gets together: Not on file    Attends religious service: Not on file    Active member of club or organization: Not on file    Attends meetings of clubs or organizations: Not on file    Relationship status: Not on file  . Intimate partner violence:    Fear of current or ex partner: Not on file    Emotionally abused: Not on file    Physically abused: Not on file    Forced sexual activity: Not on file  Other Topics Concern  . Not on file  Social History Narrative  . Not on file    Family History:  Family History  Problem Relation Age of Onset  . Diabetes Mother   . Brain cancer Mother   . Heart attack Mother   . Sleep apnea Mother   . Heart  failure Mother   . Hypertension Father   . Diabetes Father   . Heart disease Father   . Hyperlipidemia Father   . Heart attack Father   . Heart failure Father   . Sleep apnea Father   . Sleep apnea Sister   . Sleep apnea Brother   . Heart failure Maternal Aunt     Medications:   Current Outpatient Medications on File Prior to Visit  Medication Sig Dispense Refill  . ACCU-CHEK AVIVA PLUS test  strip Check blood sugar up to 1 x daily 100 each 4  . ACCU-CHEK SOFTCLIX LANCETS lancets Check blood sugar up to 1 x daily 100 each 4  . apixaban (ELIQUIS) 5 MG TABS tablet Take 1 tablet (5 mg total) by mouth 2 (two) times daily. 60 tablet 2  . blood glucose meter kit and supplies KIT Dispense based on patient and insurance preference. Check blood glucose once daily. 1 each 0  . Blood Glucose Monitoring Suppl (ACCU-CHEK AVIVA PLUS) w/Device KIT Check blood sugar up to 1 x daily 1 kit 0  . ENTRESTO 49-51 MG take 1 tablet by mouth twice a day 60 tablet 5  . furosemide (LASIX) 40 MG tablet Take '40mg'$  in morning. Take half tab for dose 20 mg tablet every evening. 135 tablet 3  . glimepiride (AMARYL) 4 MG tablet Take 1 tablet (4 mg total) by mouth daily before breakfast. Consider reduce to half pill ('2mg'$ ) (Patient taking differently: Take 4 mg by mouth daily before breakfast. '4mg'$  every other day alternate with '2mg'$  every other day) 90 tablet 3  . metoprolol succinate (TOPROL-XL) 25 MG 24 hr tablet Take 1 tablet (25 mg total) by mouth daily. 30 tablet 5  . pravastatin (PRAVACHOL) 40 MG tablet Take 2 tablets (80 mg total) by mouth daily. 60 tablet 2  . sildenafil (REVATIO) 20 MG tablet Take 5 tablets (100 mg total) by mouth as needed. Take about 30 min prior to sex. Caution stop taking if chest pain. Do not take with Nitro. 60 tablet 5  . spironolactone (ALDACTONE) 25 MG tablet Take 0.5 tablets (12.5 mg total) by mouth daily. 45 tablet 3   No current facility-administered medications on file prior to visit.       Allergies:   Allergies  Allergen Reactions  . Tomato Rash     Physical Exam  Vitals:   02/10/18 0926  BP: (!) 129/92  Pulse: 82  Weight: (!) 308 lb (139.7 kg)  Height: 6' (1.829 m)   Body mass index is 41.77 kg/m. No exam data present  General: Obese pleasant middle-aged African-American male, seated, in no evident distress Head: head normocephalic and atraumatic.   Neck: supple with no carotid or supraclavicular bruits Cardiovascular: regular rate and rhythm, no murmurs Musculoskeletal: no deformity Skin:  no rash/petichiae Vascular:  Normal pulses all extremities  Neurologic Exam Mental Status: Awake and fully alert. Oriented to place and time. Recent and remote memory intact. Attention span, concentration and fund of knowledge appropriate. Mood and affect appropriate.  Cranial Nerves: Fundoscopic exam reveals sharp disc margins. Pupils equal, briskly reactive to light. Extraocular movements full without nystagmus. Visual fields full to confrontation. Hearing intact. Facial sensation intact. Face, tongue, palate moves normally and symmetrically.  Motor: Normal bulk and tone. Normal strength in all tested extremity muscles. Sensory.: intact to touch , pinprick , position and vibratory sensation.  Coordination: Rapid alternating movements normal in all extremities. Finger-to-nose and heel-to-shin performed accurately bilaterally.  Slight decrease in left hand dexterity (chronic issue from previous stroke) Gait and Station: Arises from chair without difficulty. Stance is normal. Gait demonstrates normal stride length and balance . Able to heel, toe and tandem walk without difficulty.  Reflexes: 1+ and symmetric. Toes downgoing.    NIHSS  0 Modified Rankin  1 HAS-BLED 1 CHA2DS2-VASc 6   Diagnostic Data (Labs, Imaging, Testing)  Ct Head Wo Contrast 01/03/2018 IMPRESSION:  1. Stable compared yesterday.  2. Small recent right superior cerebellar infarct.  3. Remote  left superior cerebellar infarct.   Ct Head Wo Contrast 01/02/2018 IMPRESSION:  1.A small right superior cerebellar infarct has become apparent since yesterday.  2. Negative for hemorrhage post tPA.  3. Remote left superior cerebellar infarct.  TTE 01/02/2018 Study Conclusions - Left ventricle: The cavity size was severely dilated. There wassevere concentric hypertrophy. LV apical false tendon. Systolicfunction was severely reduced. The estimated ejection fractionwas in the range of 20% to 25%. Diffuse hypokinesis. Dopplerparameters are consistent with abnormal left ventricularrelaxation (grade 1 diastolic dysfunction). LV filling pressureis elevated. - Mitral valve: Mildly thickened leaflets . There was moderateregurgitation. - Left atrium: The atrium was normal in size. - Right ventricle: The cavity size was mildly dilated. Pacer wireor catheter noted in right ventricle. Systolic function wasnormal. - Right atrium: The atrium was normal in size. Pacer wire orcatheter noted in right atrium. - Tricuspid valve: There was trivial regurgitation. - Pulmonary arteries: Dilated. PA peak pressure: 22 mm Hg (S). - Inferior vena cava: The vessel was normal in size. Therespirophasic diameter changes were in the normal range (= 50%),consistent with normal central venous pressure. Impressions: - Compared to a prior study in 2018, the LVEF may be slightlyhigher at 20-25%. No definite apical thrombus, however, in thesetting of stroke, recommend limited Definity contrast study tor/o apical thrombus.  ICD interrogation-positive for atrial fibrillation       ASSESSMENT: Kent Stanley. is a 47 y.o. year old male here with right superior cerebellar infarct on 01/01/2018 secondary to low EF and newly diagnosed AF. Vascular risk factors include CAD, CHF, CKD, DM, HTN, HLD, OSA and AF.     PLAN: -Continue Eliquis (apixaban) daily  and pravastatin for secondary stroke  prevention -F/u with PCP regarding your HLD, HTN and DM management -f/u with cardiologist as scheduled for atrial fibrillation and Eliquis management and other chronic cardiac conditions -Continue compliance with CPAP machine for OSA treatment -Patient cleared to return to work part-time without restrictions and work note was provided -continue to monitor BP at home -Recommend to try wrist splint for possible ulnar nerve irritation -consider EMG/nerve conduction study in the future if needed -Maintain strict control of hypertension with blood pressure goal below 130/90, diabetes with hemoglobin A1c goal below 6.5% and cholesterol with LDL cholesterol (bad cholesterol) goal below 70 mg/dL. I also advised the patient to eat a healthy diet with plenty of whole grains, cereals, fruits and vegetables, exercise regularly and maintain ideal body weight.  Follow up in 6 months or call earlier if needed   Greater than 50% of time during this 25 minute visit was spent on counseling,explanation of diagnosis of right superior cerebellar infarct, reviewing risk factor management of DM, HTN, HLD, OSA and AF, planning of further management, discussion with patient and family and coordination of care   Venancio Poisson, [facility]  [facility] [address] [place], [place] [address]  Phone [phone] Fax [phone]

## 2018-02-10 ENCOUNTER — Encounter: Payer: Self-pay | Admitting: Neurology

## 2018-02-10 ENCOUNTER — Encounter: Payer: Self-pay | Admitting: Adult Health

## 2018-02-10 ENCOUNTER — Ambulatory Visit (INDEPENDENT_AMBULATORY_CARE_PROVIDER_SITE_OTHER): Payer: Medicare Other | Admitting: Adult Health

## 2018-02-10 VITALS — BP 129/92 | HR 82 | Ht 72.0 in | Wt 308.0 lb

## 2018-02-10 DIAGNOSIS — Z9989 Dependence on other enabling machines and devices: Secondary | ICD-10-CM

## 2018-02-10 DIAGNOSIS — E785 Hyperlipidemia, unspecified: Secondary | ICD-10-CM

## 2018-02-10 DIAGNOSIS — I639 Cerebral infarction, unspecified: Secondary | ICD-10-CM | POA: Diagnosis not present

## 2018-02-10 DIAGNOSIS — E119 Type 2 diabetes mellitus without complications: Secondary | ICD-10-CM | POA: Diagnosis not present

## 2018-02-10 DIAGNOSIS — I480 Paroxysmal atrial fibrillation: Secondary | ICD-10-CM

## 2018-02-10 DIAGNOSIS — G4733 Obstructive sleep apnea (adult) (pediatric): Secondary | ICD-10-CM | POA: Diagnosis not present

## 2018-02-10 DIAGNOSIS — I10 Essential (primary) hypertension: Secondary | ICD-10-CM

## 2018-02-10 NOTE — Patient Instructions (Signed)
Continue Eliquis (apixaban) daily  and lipitor  for secondary stroke prevention  Continue to follow up with PCP regarding cholesterol, blood pressure and diabetes management   Continue to follow with cardiologist for atrial fibrillation management  You are released to return to work without restrictions - work note provided  Continue to stay active and maintain a healthy diet  Continue to monitor blood pressure at home  Maintain strict control of hypertension with blood pressure goal below 130/90, diabetes with hemoglobin A1c goal below 6.5% and cholesterol with LDL cholesterol (bad cholesterol) goal below 70 mg/dL. I also advised the patient to eat a healthy diet with plenty of whole grains, cereals, fruits and vegetables, exercise regularly and maintain ideal body weight.  Followup in the future with me in 6 months or call earlier if needed       Thank you for coming to see [place] at [facility]. I hope we have been able to provide you high quality care today.  You may receive a patient satisfaction survey over the next few weeks. We would appreciate your feedback and comments so that we may continue to improve ourselves and the health of our patients.

## 2018-02-11 ENCOUNTER — Ambulatory Visit (INDEPENDENT_AMBULATORY_CARE_PROVIDER_SITE_OTHER): Payer: Medicare Other | Admitting: Family Medicine

## 2018-02-11 ENCOUNTER — Encounter: Payer: Self-pay | Admitting: Family Medicine

## 2018-02-11 VITALS — BP 112/79 | HR 42 | Temp 98.4°F | Resp 16 | Ht 72.0 in | Wt 304.0 lb

## 2018-02-11 DIAGNOSIS — E1121 Type 2 diabetes mellitus with diabetic nephropathy: Secondary | ICD-10-CM

## 2018-02-11 MED ORDER — SEMAGLUTIDE(0.25 OR 0.5MG/DOS) 2 MG/1.5ML ~~LOC~~ SOPN
0.5000 mg | PEN_INJECTOR | SUBCUTANEOUS | 0 refills | Status: DC
Start: 2018-02-11 — End: 2018-04-13

## 2018-02-11 NOTE — Assessment & Plan Note (Signed)
Persistent abnormal weight gain Discussion on wt loss options - considered continued lifestyle, medication, bariatric surgery - Given co morbidities limited med options, concern cost and safety with some. Ultimately agree that best option would be GLP1 agent for his Type 2 Diabetes and Obesity, discussed in detail - see A&P - given sample Ozempic today - Future may reconsider bariatric surgery referral

## 2018-02-11 NOTE — Progress Notes (Signed)
Subjective:    Patient ID: Kent Geralds., male    DOB: October 27, 1970, 47 y.o.   MRN: [id number]  Kent Diehl. is a 47 y.o. male presenting on 02/11/2018 for Weight Loss (diet pill) and Diabetes   HPI   CHRONIC DM, Type 2 / Morbid Obesity BMI >41 Reports concerns with weight now would like to lose weight CBGs: Avg 110-120, Low >95, High <170. Checks CBGs daily Meds: Amaryl 4mg  daily Reports good compliance. Tolerating well w/o side-effects Currently on ARB Sherryll Burger) Lifestyle: - Limited weight loss with diet, exercise lifestyle. He has multiple co-morbidities CAD and prior CVA. He is interested in medication assistance to help lose weight. He has considered bariatric surgery before but not ready for this. - Diet (reducing portions, low carb, heart healthy)  - Exercise (he was released to return to work, walking more) Denies hypoglycemia, polyuria, visual changes, numbness or tingling.   Depression screen [facility] 2/9 02/11/2018 01/28/2018 12/09/2017  Decreased Interest 0 0 0  Down, Depressed, Hopeless 0 1 0  PHQ - 2 Score 0 1 0  Altered sleeping - - -  Tired, decreased energy - - -  Change in appetite - - -  Feeling bad or failure about yourself  - - -  Trouble concentrating - - -  Moving slowly or fidgety/restless - - -  Suicidal thoughts - - -  PHQ-9 Score - - -  Difficult doing work/chores - - -    Social History   Tobacco Use  . Smoking status: Former Smoker    Types: Cigars    Last attempt to quit: 01/02/2018    Years since quitting: 0.1  . Smokeless tobacco: Former Neurosurgeon  . Tobacco comment: smokes ~10 black and milds per week  Substance Use Topics  . Alcohol use: Yes    Comment: 8-9 beers a month per pt  . Drug use: No    Review of Systems Per HPI unless specifically indicated above     Objective:    BP 112/79   Pulse (!) 42   Temp 98.4 F (36.9 C) (Oral)   Resp 16   Ht 6' (1.829 m)   Wt (!) 304 lb (137.9 kg)   BMI 41.23 kg/m   Wt Readings  from Last 3 Encounters:  02/11/18 (!) 304 lb (137.9 kg)  02/10/18 (!) 308 lb (139.7 kg)  01/28/18 (!) 304 lb 8 oz (138.1 kg)    Physical Exam  Constitutional: He is oriented to person, place, and time. He appears well-developed and well-nourished. No distress.  Well-appearing, comfortable, cooperative, morbidly obese  HENT:  Head: Normocephalic and atraumatic.  Mouth/Throat: Oropharynx is clear and moist.  Eyes: Conjunctivae are normal. Right eye exhibits no discharge. Left eye exhibits no discharge.  Neck: Normal range of motion. Neck supple.  Cardiovascular: Normal heart sounds and intact distal pulses.  No murmur heard. Irregularly irregular, bradycardic  Pulmonary/Chest: Effort normal and breath sounds normal. No respiratory distress. He has no wheezes. He has no rales.  Musculoskeletal: Normal range of motion. He exhibits no edema.  Neurological: He is alert and oriented to person, place, and time.  Skin: Skin is warm and dry. No rash noted. He is not diaphoretic. No erythema.  Psychiatric: He has a normal mood and affect. His behavior is normal.  Well groomed, good eye contact, normal speech and thoughts  Nursing note and vitals reviewed.  Results for orders placed or performed in visit on 01/18/18  POCT UA -  Microalbumin  Result Value Ref Range   Microalbumin Ur, POC 20 mg/L      Assessment & Plan:   Problem List Items Addressed This Visit    Morbid obesity (HCC)    Persistent abnormal weight gain Discussion on wt loss options - considered continued lifestyle, medication, bariatric surgery - Given co morbidities limited med options, concern cost and safety with some. 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 Reviewed diabetes management and complications Given co morbidities limited med options, proceed with GLP1 agent for his Type 2 Diabetes and Obesity, discussed in detail, on CV benefits, A1c and wt loss, also review potential side effects and dosing - Concern coverage on Medicare Part D - he will need to contact Medicare for coverage details and other GLP1 agents as well, also he can check into Medicare Extra Help for financial assistance - Wait 1 week then Start Ozempic 0.25mg  weekly for 4 weeks then 0.5mg  weekly until return - given sample in office today, signed out see log, instructed on use - STOP Amaryl 4mg  daily - (on 1st day of Ozempic dose)  Encourage improved lifestyle - low carb, low sugar diet, reduce portion size, continue improving regular exercise Check CBG, bring log to next visit for review Continue anticoagulation, Entresto, Statin Advised to schedule DM ophtho exam, send record - handout given Follow-up 2 months DM A1c - med adjust - refill at that time if can get covered or may have to switch GLP1 agent  Check urine microalbumin today mild positive 20       Relevant Medications   Semaglutide (OZEMPIC) 0.25 or 0.5 MG/DOSE SOPN      Meds ordered this encounter  Medications  . Semaglutide (OZEMPIC) 0.25 or 0.5 MG/DOSE SOPN    Sig: Inject 0.5 mg into the skin once a week. For first 4 weeks, inject dose 0.25mg  weekly    Dispense:  1 pen    Refill:  0    Follow up plan: Return in about 2 months (around 04/13/2018) for Diabetes A1c, med adjust GLP1.  Saralyn Pilar, DO [facility] 02/11/2018, 9:12 AM

## 2018-02-11 NOTE — Assessment & Plan Note (Addendum)
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 Reviewed diabetes management and complications Given co morbidities limited med options, proceed with GLP1 agent for his Type 2 Diabetes and Obesity, discussed in detail, on CV benefits, A1c and wt loss, also review potential side effects and dosing - Concern coverage on Medicare Part D - he will need to contact Medicare for coverage details and other GLP1 agents as well, also he can check into Medicare Extra Help for financial assistance - Wait 1 week then Start Ozempic 0.25mg  weekly for 4 weeks then 0.5mg  weekly until return - given sample in office today, signed out see log, instructed on use - STOP Amaryl 4mg  daily - (on 1st day of Ozempic dose)  Encourage improved lifestyle - low carb, low sugar diet, reduce portion size, continue improving regular exercise Check CBG, bring log to next visit for review Continue anticoagulation, Entresto, Statin Advised to schedule DM ophtho exam, send record - handout given Follow-up 2 months DM A1c - med adjust - refill at that time if can get covered or may have to switch GLP1 agent  Check urine microalbumin today mild positive 20

## 2018-02-11 NOTE — Patient Instructions (Addendum)
Thank you for coming to the office today.  Stop taking Amaryl on the day on the day you plan to start the injection.  Use Ozempic 0.25mg  injection as discussed once weekly for 4 weeks then increase to 0.5mg  weekly until finished - should last 2 more weeks.  May check into rx coverage at pharmacy.  Call insurance find cost and coverage of the following  1. Ozempic (Semaglutide injection) - start 0.25mg  weekly for 4 weeks then increase to 0.5mg  weekly - This one has best benefit of weight loss and reducing Cardiovascular events  2. Bydureon BCise (Exenatide ER) - once weekly - this is my preference, very good medicine well tolerated, less side effects of nausea, upset stomach. No dose changes. Cost and coverage is the problem, but we may be able to get it with the coupon card  3. Trulicity (Dulaglutide) - once weekly - this is very good one, usually one of my top choices as well, two doses, 0.75 (likely we would start) and 1.5 max dose. We can use coupon card here too  4. Victoza (Liraglutide) - once DAILY - 3 dose changes 0.6, 1.2 and 1.8, side effects nausea, upset stomach higher on this one but it is still very effective medicine  Please schedule a Follow-up Appointment to: Return in about 2 months (around 04/13/2018) for Diabetes A1c, med adjust GLP1.  If you have any other questions or concerns, please feel free to call the office or send a message through MyChart. You may also schedule an earlier appointment if necessary.  Additionally, you may be receiving a survey about your experience at our office within a few days to 1 week by e-mail or mail. We value your feedback.  Saralyn Pilar, DO [facility], [place]

## 2018-02-12 ENCOUNTER — Telehealth: Payer: Self-pay

## 2018-02-12 NOTE — Telephone Encounter (Signed)
Work Physicist, medical, and release to work form fax to [employer] at [phone]. Both letter and work form fax twice and confirmed. Pt also paid 50.00 fee. Sent to medical records.

## 2018-02-18 NOTE — Progress Notes (Signed)
I agree with the above plan 

## 2018-03-02 ENCOUNTER — Ambulatory Visit (INDEPENDENT_AMBULATORY_CARE_PROVIDER_SITE_OTHER): Payer: Medicare Other | Admitting: Internal Medicine

## 2018-03-02 ENCOUNTER — Encounter: Payer: Self-pay | Admitting: Internal Medicine

## 2018-03-02 VITALS — BP 120/82 | HR 79 | Ht 72.0 in | Wt 306.5 lb

## 2018-03-02 DIAGNOSIS — I255 Ischemic cardiomyopathy: Secondary | ICD-10-CM

## 2018-03-02 DIAGNOSIS — Z95810 Presence of automatic (implantable) cardiac defibrillator: Secondary | ICD-10-CM

## 2018-03-02 DIAGNOSIS — I428 Other cardiomyopathies: Secondary | ICD-10-CM | POA: Diagnosis not present

## 2018-03-02 DIAGNOSIS — I5022 Chronic systolic (congestive) heart failure: Secondary | ICD-10-CM

## 2018-03-02 NOTE — Progress Notes (Signed)
Patient Care Team: Olin Hauser, DO as PCP - General (Family Medicine) Alisa Graff, FNP as Nurse Practitioner (Family Medicine) Wellington Hampshire, MD as Consulting Physician (Cardiology) Laverle Hobby, MD as Consulting Physician (Pulmonary Disease)   HPI  Kent Stanley. is a 47 y.o. male Seen in followup for ICD implanted 5/18 for primary prevention with NICM Hx of prior strokes; he was hospitalized 6/19 with a recurrent stroke.  Also noted in the cerebellum.  Device interrogation had demonstrated atrial fibrillation.  clopidogrel was discontinued and apixaban was initiated.  No bleeding   Lost all of his insurance coverage is now back on Medicare and Medicaid.  Working in [employer]  Denies chest pain or shortness of breath or edema.  Is not all that active.   DATE TEST EF   4/14*  Echo  20 %   2016 Cath  No obstructive CAD  9/17  Echo  20-25 %   4/18 Echo  15-20% Mild LAE  6/19 Echo  20-25% LAE mild    Date Cr K Hgb  5/18  1.61 4.4    7/19  1.3 4.2 15.8      Past Medical History:  Diagnosis Date  . Chronic systolic heart failure ([place])    a. 10/2012 Echo: EF <20%, mod to sev dil LV, diast dysfxn, mildly dil LA/RA, mod to severe MR/TR.  [name] Coronary artery disease, non-occlusive    a. 09/2014 Cath: minor luminal irregularities with severely dilated left ventricle. LVEDP 30.  [name] H/O noncompliance with medical treatment, presenting hazards to health   . History of traumatic injury of head   . Hyperlipidemia   . Hypertension   . Morbid obesity ([place])   . NICM (nonischemic cardiomyopathy) ([place])    a. 10/2012 Echo: EF <20%.  . Polysubstance abuse ([place])    a. cocaine, tobacco, THC, ETOH  . Seizure disorder ([place])   . Stroke [facility])     Past Surgical History:  Procedure Laterality Date  . CARDIAC CATHETERIZATION     [facility]  . ICD IMPLANT N/A 12/08/2016   Procedure: ICD Implant;  Surgeon: Deboraha Sprang, MD;  Location: [place] [facility];  Service: Cardiovascular;  Laterality: N/A;    Current Outpatient Medications  Medication Sig Dispense Refill  . ACCU-CHEK AVIVA PLUS test strip Check blood sugar up to 1 x daily 100 each 4  . ACCU-CHEK SOFTCLIX LANCETS lancets Check blood sugar up to 1 x daily 100 each 4  . apixaban (ELIQUIS) 5 MG TABS tablet Take 1 tablet (5 mg total) by mouth 2 (two) times daily. 60 tablet 2  . blood glucose meter kit and supplies KIT Dispense based on patient and insurance preference. Check blood glucose once daily. 1 each 0  . Blood Glucose Monitoring Suppl (ACCU-CHEK AVIVA PLUS) w/Device KIT Check blood sugar up to 1 x daily 1 kit 0  . ENTRESTO 49-51 MG take 1 tablet by mouth twice a day 60 tablet 5  . furosemide (LASIX) 40 MG tablet Take '40mg'$  in morning. Take half tab for dose 20 mg tablet every evening. 135 tablet 3  . metoprolol succinate (TOPROL-XL) 25 MG 24 hr tablet Take 1 tablet (25 mg total) by mouth daily. 30 tablet 5  . pravastatin (PRAVACHOL) 40 MG tablet Take 2 tablets (80 mg total) by mouth daily. 60 tablet 2  . Semaglutide (OZEMPIC) 0.25 or 0.5 MG/DOSE SOPN Inject 0.5 mg into the skin once a  week. For first 4 weeks, inject dose 0.'25mg'$  weekly 1 pen 0  . sildenafil (REVATIO) 20 MG tablet Take 5 tablets (100 mg total) by mouth as needed. Take about 30 min prior to sex. Caution stop taking if chest pain. Do not take with Nitro. 60 tablet 5  . spironolactone (ALDACTONE) 25 MG tablet Take 0.5 tablets (12.5 mg total) by mouth daily. 45 tablet 3   No current facility-administered medications for this visit.     Allergies  Allergen Reactions  . Tomato Rash      Review of Systems negative except from HPI and PMH  Physical Exam BP 120/82 (BP Location: Left Arm, Patient Position: Sitting, Cuff Size: Large)   Pulse 79   Ht 6' (1.829 m)   Wt (!) 306 lb 8 oz (139 kg)   BMI 41.57 kg/m  Well developed and nourished in no acute distress HENT normal Neck supple with  JVP-flat Clear Regular rate and rhythm, no murmurs or gallops Abd-soft with active BS No Clubbing cyanosis edema Skin-warm and dry A & Oriented  Grossly normal sensory and motor function  ECG sinus 79 16/09/42  Assessment and  Plan  NICM  Congestive heart failure-chronic-systolic  ICD Medtronic   Sleep apnea-treated  Stroke-3  Atrial fib paroxysmal   Morbid obesity  Renal dysfunction grade 3  VT Nonsustained    Interval stroke and interval atrial fibrillation.  Now on apixaban no bleeding  Euvolemic continue current meds  Sleep apnea machine was broken.  He has a new one.  Working better.  Encouraged ongoing weight loss.  Continue Entresto, metoprolol and Aldactone.  Surveillance labs were normal 7/19

## 2018-03-02 NOTE — Patient Instructions (Signed)
Medication Instructions: - Your physician recommends that you continue on your current medications as directed. Please refer to the Current Medication list given to you today.  Labwork: - none ordered  Procedures/Testing: - none ordered  Follow-Up: - Remote monitoring is used to monitor your Pacemaker of ICD from home. This monitoring reduces the number of office visits required to check your device to one time per year. It allows [place] to keep an eye on the functioning of your device to ensure it is working properly. You are scheduled for a device check from home on 03/25/18. You may send your transmission at any time that day. If you have a wireless device, the transmission will be sent automatically. After your physician reviews your transmission, you will receive a postcard with your next transmission date.  - Your physician wants you to follow-up in: 1 year with Dr. Graciela Husbands. You will receive a reminder letter in the mail/ call two months in advance. If you don't receive a letter/ call, please call our office to schedule the follow-up appointment.   Any Additional Special Instructions Will Be Listed Below (If Applicable).     If you need a refill on your cardiac medications before your next appointment, please call your pharmacy.

## 2018-03-10 LAB — CUP PACEART INCLINIC DEVICE CHECK
Battery Remaining Longevity: 132 mo
Battery Voltage: 3.01 V
HIGH POWER IMPEDANCE MEASURED VALUE: 65 Ohm
Implantable Lead Model: 293
Lead Channel Impedance Value: 342 Ohm
Lead Channel Impedance Value: 342 Ohm
Lead Channel Setting Pacing Amplitude: 3.75 V
Lead Channel Setting Pacing Pulse Width: 0.4 ms
Lead Channel Setting Sensing Sensitivity: 0.3 mV
MDC IDC LEAD IMPLANT DT: 20180604
MDC IDC LEAD LOCATION: 753860
MDC IDC LEAD SERIAL: 433424
MDC IDC MSMT LEADCHNL RV PACING THRESHOLD AMPLITUDE: 1.75 V
MDC IDC MSMT LEADCHNL RV PACING THRESHOLD PULSEWIDTH: 0.4 ms
MDC IDC MSMT LEADCHNL RV SENSING INTR AMPL: 14.125 mV
MDC IDC PG IMPLANT DT: 20180604
MDC IDC SESS DTM: 20190827153107
MDC IDC STAT BRADY RV PERCENT PACED: 0.01 %

## 2018-03-11 ENCOUNTER — Other Ambulatory Visit: Payer: Self-pay

## 2018-03-11 ENCOUNTER — Other Ambulatory Visit: Payer: Self-pay | Admitting: Family Medicine

## 2018-03-11 DIAGNOSIS — E782 Mixed hyperlipidemia: Secondary | ICD-10-CM

## 2018-03-11 DIAGNOSIS — I5022 Chronic systolic (congestive) heart failure: Secondary | ICD-10-CM

## 2018-03-11 DIAGNOSIS — I10 Essential (primary) hypertension: Secondary | ICD-10-CM

## 2018-03-11 MED ORDER — METOPROLOL SUCCINATE ER 25 MG PO TB24: 25.0000 mg | ORAL_TABLET | Freq: Every day | ORAL | 1 refills | Status: DC

## 2018-03-25 ENCOUNTER — Ambulatory Visit (INDEPENDENT_AMBULATORY_CARE_PROVIDER_SITE_OTHER): Payer: Medicare Other | Admitting: *Deleted

## 2018-03-25 DIAGNOSIS — I5022 Chronic systolic (congestive) heart failure: Secondary | ICD-10-CM

## 2018-03-25 NOTE — Progress Notes (Signed)
Remote ICD transmission.   

## 2018-03-30 ENCOUNTER — Emergency Department: Payer: Medicare Other

## 2018-03-30 ENCOUNTER — Emergency Department
Admission: EM | Admit: 2018-03-30 | Discharge: 2018-03-30 | Disposition: A | Discharge status: Home / Self Care | Payer: Medicare Other | Attending: Emergency Medicine | Admitting: Emergency Medicine

## 2018-03-30 ENCOUNTER — Encounter: Payer: Self-pay | Admitting: Medical Oncology

## 2018-03-30 DIAGNOSIS — I130 Hypertensive heart and chronic kidney disease with heart failure and stage 1 through stage 4 chronic kidney disease, or unspecified chronic kidney disease: Secondary | ICD-10-CM | POA: Diagnosis not present

## 2018-03-30 DIAGNOSIS — M25511 Pain in right shoulder: Secondary | ICD-10-CM | POA: Insufficient documentation

## 2018-03-30 DIAGNOSIS — R202 Paresthesia of skin: Secondary | ICD-10-CM | POA: Diagnosis not present

## 2018-03-30 DIAGNOSIS — Z95810 Presence of automatic (implantable) cardiac defibrillator: Secondary | ICD-10-CM | POA: Diagnosis not present

## 2018-03-30 DIAGNOSIS — N183 Chronic kidney disease, stage 3 unspecified: Secondary | ICD-10-CM

## 2018-03-30 DIAGNOSIS — Z87891 Personal history of nicotine dependence: Secondary | ICD-10-CM | POA: Diagnosis not present

## 2018-03-30 DIAGNOSIS — S199XXA Unspecified injury of neck, initial encounter: Secondary | ICD-10-CM | POA: Diagnosis not present

## 2018-03-30 DIAGNOSIS — I5022 Chronic systolic (congestive) heart failure: Secondary | ICD-10-CM | POA: Insufficient documentation

## 2018-03-30 DIAGNOSIS — S0990XA Unspecified injury of head, initial encounter: Secondary | ICD-10-CM | POA: Diagnosis not present

## 2018-03-30 DIAGNOSIS — Z7901 Long term (current) use of anticoagulants: Secondary | ICD-10-CM | POA: Insufficient documentation

## 2018-03-30 DIAGNOSIS — M542 Cervicalgia: Secondary | ICD-10-CM | POA: Insufficient documentation

## 2018-03-30 DIAGNOSIS — Y92009 Unspecified place in unspecified non-institutional (private) residence as the place of occurrence of the external cause: Secondary | ICD-10-CM | POA: Diagnosis not present

## 2018-03-30 DIAGNOSIS — E1122 Type 2 diabetes mellitus with diabetic chronic kidney disease: Secondary | ICD-10-CM | POA: Diagnosis not present

## 2018-03-30 DIAGNOSIS — R51 Headache: Secondary | ICD-10-CM | POA: Diagnosis not present

## 2018-03-30 DIAGNOSIS — Y999 Unspecified external cause status: Secondary | ICD-10-CM | POA: Diagnosis not present

## 2018-03-30 DIAGNOSIS — R220 Localized swelling, mass and lump, head: Secondary | ICD-10-CM | POA: Diagnosis not present

## 2018-03-30 DIAGNOSIS — Z79899 Other long term (current) drug therapy: Secondary | ICD-10-CM | POA: Diagnosis not present

## 2018-03-30 DIAGNOSIS — W19XXXA Unspecified fall, initial encounter: Secondary | ICD-10-CM

## 2018-03-30 DIAGNOSIS — W109XXA Fall (on) (from) unspecified stairs and steps, initial encounter: Secondary | ICD-10-CM | POA: Diagnosis not present

## 2018-03-30 DIAGNOSIS — Y9389 Activity, other specified: Secondary | ICD-10-CM | POA: Insufficient documentation

## 2018-03-30 DIAGNOSIS — I129 Hypertensive chronic kidney disease with stage 1 through stage 4 chronic kidney disease, or unspecified chronic kidney disease: Secondary | ICD-10-CM | POA: Diagnosis not present

## 2018-03-30 LAB — CBC
HEMATOCRIT: 41.7 % (ref 40.0–52.0)
Hemoglobin: 14.4 g/dL (ref 13.0–18.0)
MCH: 34.2 pg — ABNORMAL HIGH (ref 26.0–34.0)
MCHC: 34.5 g/dL (ref 32.0–36.0)
MCV: 99.3 fL (ref 80.0–100.0)
PLATELETS: 237 10*3/uL (ref 150–440)
RBC: 4.2 MIL/uL — AB (ref 4.40–5.90)
RDW: 14.2 % (ref 11.5–14.5)
WBC: 8.3 10*3/uL (ref 3.8–10.6)

## 2018-03-30 LAB — COMPREHENSIVE METABOLIC PANEL
ALT: 21 U/L (ref 0–44)
AST: 27 U/L (ref 15–41)
Albumin: 4 g/dL (ref 3.5–5.0)
Alkaline Phosphatase: 32 U/L — ABNORMAL LOW (ref 38–126)
Anion gap: 6 (ref 5–15)
BILIRUBIN TOTAL: 1 mg/dL (ref 0.3–1.2)
BUN: 29 mg/dL — AB (ref 6–20)
CHLORIDE: 104 mmol/L (ref 98–111)
CO2: 27 mmol/L (ref 22–32)
Calcium: 8.8 mg/dL — ABNORMAL LOW (ref 8.9–10.3)
Creatinine, Ser: 1.58 mg/dL — ABNORMAL HIGH (ref 0.61–1.24)
GFR calc Af Amer: 58 mL/min — ABNORMAL LOW (ref >60)
GFR, EST NON AFRICAN AMERICAN: 50 mL/min — AB (ref >60)
Glucose, Bld: 117 mg/dL — ABNORMAL HIGH (ref 70–99)
Potassium: 3.7 mmol/L (ref 3.5–5.1)
Sodium: 137 mmol/L (ref 135–145)
Total Protein: 7.4 g/dL (ref 6.5–8.1)

## 2018-03-30 LAB — PROTIME-INR
INR: 1.07
PROTHROMBIN TIME: 13.8 s (ref 11.4–15.2)

## 2018-03-30 MED ORDER — TRAMADOL HCL 50 MG PO TABS: 50.0000 mg | ORAL_TABLET | Freq: Four times a day (QID) | ORAL | 0 refills | Status: DC | PRN

## 2018-03-30 MED ORDER — CYCLOBENZAPRINE HCL 5 MG PO TABS: 5.0000 mg | ORAL_TABLET | Freq: Three times a day (TID) | ORAL | 0 refills | Status: AC | PRN

## 2018-03-30 NOTE — ED Triage Notes (Addendum)
Pt reports that he fell last night, tripped on steps. Reports pain to neck and upper back. Also hit left side of face.

## 2018-03-30 NOTE — ED Notes (Signed)
First Nurse Note: Patient ambulatory to ED complaining of right shoulder and neck pain after a fall going up the stairs last PM, states his balance is off.  Placed in WC.

## 2018-03-30 NOTE — ED Notes (Signed)
Says he fell going up steps at home last night.  Says he thinks he just lost his footing.  Says did not pass out, but he hit his left face and had to sit there for a while before getting up.  He says pain is in right side neck out to shoulder and increases with turning head or turning body.

## 2018-03-30 NOTE — ED Provider Notes (Signed)
[facility] Emergency Department Provider Note  ____________________________________________  Time seen: Approximately 11:07 AM  I have reviewed the triage vital signs and the nursing notes.   HISTORY  Chief Complaint Fall    HPI Kent Stanley. is a 47 y.o. male with past medical history of TIA, stroke, diabetes, CKD, CHF that presents emergency department for evaluation after fall last night.  Patient states that he was carrying some objects up the stairs yesterday and fell on the last stair.  He is not sure if he tripped.  He hit the left side of his face area and he did not lose consciousness.  After he stood up, he noticed some tingling and funny feeling to his right hand.  He states that pain is primarily now over the right side of his neck.  This does not feel like when he had a TIA or stroke previously.  His girlfriend, who is a nurse will be staying with him for the next couple of days.  No alleviating measures have been attempted.  He takes Eliquis daily.  No visual changes, dizziness, speech slurring.  Past Medical History:  Diagnosis Date  . Chronic systolic heart failure ([place])    a. 10/2012 Echo: EF <20%, mod to sev dil LV, diast dysfxn, mildly dil LA/RA, mod to severe MR/TR.  [name] Coronary artery disease, non-occlusive    a. 09/2014 Cath: minor luminal irregularities with severely dilated left ventricle. LVEDP 30.  [name] H/O noncompliance with medical treatment, presenting hazards to health   . History of traumatic injury of head   . Hyperlipidemia   . Hypertension   . Morbid obesity ([place])   . NICM (nonischemic cardiomyopathy) ([place])    a. 10/2012 Echo: EF <20%.  . Polysubstance abuse ([place])    a. cocaine, tobacco, THC, ETOH  . Seizure disorder ([place])   . Stroke [facility])     Patient Active Problem List   Diagnosis Date Noted  . Atrial fibrillation ([place]) 01/04/2018  . Tobacco use disorder 01/04/2018  . History of stroke involving cerebellum  01/01/2018  . Vertigo due to previous cerebellar infarction 06/10/2016  . CKD (chronic kidney disease), stage II 05/15/2016  . Type 2 diabetes mellitus with nephropathy ([place]) 04/18/2016  . Morbid obesity ([place])   . Seizure disorder ([place])   . NICM (nonischemic cardiomyopathy) ([place])   . Musculoskeletal pain 10/31/2014  . Erectile dysfunction 10/31/2014  . OSA (obstructive sleep apnea) 11/05/2013  . Chronic systolic heart failure ([place])   . Hyperlipidemia   . Hypertension   . History of CVA (cerebrovascular accident) 01/27/2013    Past Surgical History:  Procedure Laterality Date  . CARDIAC CATHETERIZATION     [facility]  . ICD IMPLANT N/A 12/08/2016   Procedure: ICD Implant;  Surgeon: Deboraha Sprang, MD;  Location: [facility] CV LAB;  Service: Cardiovascular;  Laterality: N/A;    Prior to Admission medications   Medication Sig Start Date End Date Taking? Authorizing Provider  ACCU-CHEK AVIVA PLUS test strip Check blood sugar up to 1 x daily 01/18/18  Yes Karamalegos, [name], DO  ACCU-CHEK SOFTCLIX LANCETS lancets Check blood sugar up to 1 x daily 01/18/18  Yes Karamalegos, [name], DO  apixaban (ELIQUIS) 5 MG TABS tablet Take 1 tablet (5 mg total) by mouth 2 (two) times daily. 01/04/18  Yes Rosalin Hawking, MD  blood glucose meter kit and supplies KIT Dispense based on patient and insurance preference. Check blood glucose once daily. 08/21/15  Yes  [name], [name], NP  Blood Glucose Monitoring Suppl (ACCU-CHEK AVIVA PLUS) w/Device KIT Check blood sugar up to 1 x daily 01/18/18  Yes Olin Hauser, DO  ENTRESTO 49-51 MG take 1 tablet by mouth twice a day 11/25/16  Yes [name], FNP  furosemide (LASIX) 40 MG tablet Take 15m in morning. Take half tab for dose 20 mg tablet every evening. 01/25/18  Yes HDarylene PriceA, FNP  pravastatin (PRAVACHOL) 40 MG tablet TAKE 2 TABLETS BY MOUTH ONCE DAILY 03/11/18  Yes Karamalegos, ADevonne Doughty DO  Semaglutide (OZEMPIC) 0.25 or 0.5 MG/DOSE SOPN  Inject 0.5 mg into the skin once a week. For first 4 weeks, inject dose 0.255mweekly 02/11/18  Yes Karamalegos, AlDevonne DoughtyDO  sildenafil (REVATIO) 20 MG tablet Take 5 tablets (100 mg total) by mouth as needed. Take about 30 min prior to sex. Caution stop taking if chest pain. Do not take with Nitro. 11/23/17  Yes Karamalegos, AlDevonne DoughtyDO  spironolactone (ALDACTONE) 25 MG tablet Take 0.5 tablets (12.5 mg total) by mouth daily. 04/23/17  Yes Karamalegos, AlDevonne DoughtyDO  cyclobenzaprine (FLEXERIL) 5 MG tablet Take 1 tablet (5 mg total) by mouth 3 (three) times daily as needed for up to 7 days for muscle spasms. 03/30/18 04/06/18  WaLaban EmperorPA-C  metoprolol succinate (TOPROL-XL) 25 MG 24 hr tablet Take 1 tablet (25 mg total) by mouth daily. 03/11/18   Karamalegos, AlDevonne DoughtyDO  traMADol (ULTRAM) 50 MG tablet Take 1 tablet (50 mg total) by mouth every 6 (six) hours as needed. 03/30/18 03/30/19  WaLaban EmperorPA-C    Allergies Tomato  Family History  Problem Relation Age of Onset  . Diabetes Mother   . Brain cancer Mother   . Heart attack Mother   . Sleep apnea Mother   . Heart failure Mother   . Hypertension Father   . Diabetes Father   . Heart disease Father   . Hyperlipidemia Father   . Heart attack Father   . Heart failure Father   . Sleep apnea Father   . Sleep apnea Sister   . Sleep apnea Brother   . Heart failure Maternal Aunt     Social History Social History   Tobacco Use  . Smoking status: Former Smoker    Types: Cigars    Last attempt to quit: 01/02/2018    Years since quitting: 0.2  . Smokeless tobacco: Former [employer] developer. Tobacco comment: smokes ~10 black and milds per week  Substance Use Topics  . Alcohol use: Yes    Comment: 8-9 beers a month per pt  . Drug use: No     Review of Systems  Constitutional: No fever/chills ENT: No upper respiratory complaints. Cardiovascular: No chest pain. Respiratory: No cough. No SOB. Gastrointestinal: No abdominal  pain.  No nausea, no vomiting.  Musculoskeletal: Positive for neck pain. Skin: Negative for rash, abrasions, lacerations, ecchymosis. Neurological: Negative for headaches, numbness or tingling   ____________________________________________   PHYSICAL EXAM:  VITAL SIGNS: ED Triage Vitals  Enc Vitals Group     BP 03/30/18 1011 119/80     Pulse Rate 03/30/18 1011 81     Resp 03/30/18 1011 18     Temp 03/30/18 1011 98.2 F (36.8 C)     Temp Source 03/30/18 1011 Oral     SpO2 03/30/18 1011 97 %     Weight 03/30/18 1013 294 lb (133.4 kg)     Height 03/30/18 1013 6' (  1.829 m)     Head Circumference --      Peak Flow --      Pain Score 03/30/18 1013 9     Pain Loc --      Pain Edu? --      Excl. in [place]? --      Constitutional: Alert and oriented. Well appearing and in no acute distress. Eyes: Conjunctivae are normal. PERRL. EOMI. Head: Atraumatic. ENT:      Ears:      Nose: No congestion/rhinnorhea.      Mouth/Throat: Mucous membranes are moist.  Neck: No stridor. No cervical spine tenderness to palpation.  Tenderness to palpation over right trapezius muscle.  Full range of motion of neck. Cardiovascular: Normal rate, regular rhythm.  Good peripheral circulation. Respiratory: Normal respiratory effort without tachypnea or retractions. Lungs CTAB. Good air entry to the bases with no decreased or absent breath sounds. Musculoskeletal: Full range of motion to all extremities. No gross deformities appreciated. Neurologic: Normal speech and language. No gross focal neurologic deficits are appreciated.  Cranial nerves: 2-10 normal as tested. Strength 5/5 in upper and lower extremities Skin:  Skin is warm, dry and intact. No rash noted. Psychiatric: Mood and affect are normal. Speech and behavior are normal. Patient exhibits appropriate insight and judgement.   ____________________________________________   LABS (all labs ordered are listed, but only abnormal results are  displayed)  Labs Reviewed  CBC - Abnormal; Notable for the following components:      Result Value   RBC 4.20 (*)    MCH 34.2 (*)    All other components within normal limits  COMPREHENSIVE METABOLIC PANEL - Abnormal; Notable for the following components:   Glucose, Bld 117 (*)    BUN 29 (*)    Creatinine, Ser 1.58 (*)    Calcium 8.8 (*)    Alkaline Phosphatase 32 (*)    GFR calc non Af Amer 50 (*)    GFR calc Af Amer 58 (*)    All other components within normal limits  PROTIME-INR   ____________________________________________  EKG   ____________________________________________  RADIOLOGY Robinette Haines, personally viewed and evaluated these images (plain radiographs) as part of my medical decision making, as well as reviewing the written report by the radiologist.  Ct Head Wo Contrast  Result Date: 03/30/2018 CLINICAL DATA:  Recent fall with left facial pain and headaches EXAM: CT HEAD WITHOUT CONTRAST CT MAXILLOFACIAL WITHOUT CONTRAST CT CERVICAL SPINE WITHOUT CONTRAST TECHNIQUE: Multidetector CT imaging of the head, cervical spine, and maxillofacial structures were performed using the standard protocol without intravenous contrast. Multiplanar CT image reconstructions of the cervical spine and maxillofacial structures were also generated. COMPARISON:  01/03/2018 FINDINGS: CT HEAD FINDINGS Brain: Changes consistent with prior left cerebellar infarct superiorly are seen. The previously seen right cerebellar infarct is less prominent on today's exam. No findings to suggest acute hemorrhage, acute infarction or space-occupying mass lesion are seen. Vascular: No hyperdense vessel or unexpected calcification. Skull: Normal. Negative for fracture or focal lesion. Other: None. CT MAXILLOFACIAL FINDINGS Osseous: No fracture or mandibular dislocation. No destructive process. Orbits: Negative. No traumatic or inflammatory finding. Sinuses: Clear. Soft tissues: Minimal soft tissue swelling  is noted in the left face related to the recent injury. No focal hematoma is seen. CT CERVICAL SPINE FINDINGS Alignment: Loss of the normal cervical lordosis is noted. Skull base and vertebrae: Seven cervical segments are well visualized. No acute fracture or acute facet abnormality is noted. The odontoid is  within normal limits. Soft tissues and spinal canal: No soft tissue abnormality is identified. Upper chest: Within normal limits. Other: None IMPRESSION: CT of the head: Chronic changes similar to that noted on the prior exam. No acute abnormality is identified. CT of the maxillofacial bones: Minimal soft tissue swelling. No acute bony abnormality is noted. CT of the cervical spine: No acute bony abnormality is noted Mild straightening of the normal cervical lordosis is noted which may be related to muscle spasm. Electronically Signed   By: Inez Catalina M.D.   On: 03/30/2018 11:52   Ct Cervical Spine Wo Contrast  Result Date: 03/30/2018 CLINICAL DATA:  Recent fall with left facial pain and headaches EXAM: CT HEAD WITHOUT CONTRAST CT MAXILLOFACIAL WITHOUT CONTRAST CT CERVICAL SPINE WITHOUT CONTRAST TECHNIQUE: Multidetector CT imaging of the head, cervical spine, and maxillofacial structures were performed using the standard protocol without intravenous contrast. Multiplanar CT image reconstructions of the cervical spine and maxillofacial structures were also generated. COMPARISON:  01/03/2018 FINDINGS: CT HEAD FINDINGS Brain: Changes consistent with prior left cerebellar infarct superiorly are seen. The previously seen right cerebellar infarct is less prominent on today's exam. No findings to suggest acute hemorrhage, acute infarction or space-occupying mass lesion are seen. Vascular: No hyperdense vessel or unexpected calcification. Skull: Normal. Negative for fracture or focal lesion. Other: None. CT MAXILLOFACIAL FINDINGS Osseous: No fracture or mandibular dislocation. No destructive process. Orbits:  Negative. No traumatic or inflammatory finding. Sinuses: Clear. Soft tissues: Minimal soft tissue swelling is noted in the left face related to the recent injury. No focal hematoma is seen. CT CERVICAL SPINE FINDINGS Alignment: Loss of the normal cervical lordosis is noted. Skull base and vertebrae: Seven cervical segments are well visualized. No acute fracture or acute facet abnormality is noted. The odontoid is within normal limits. Soft tissues and spinal canal: No soft tissue abnormality is identified. Upper chest: Within normal limits. Other: None IMPRESSION: CT of the head: Chronic changes similar to that noted on the prior exam. No acute abnormality is identified. CT of the maxillofacial bones: Minimal soft tissue swelling. No acute bony abnormality is noted. CT of the cervical spine: No acute bony abnormality is noted Mild straightening of the normal cervical lordosis is noted which may be related to muscle spasm. Electronically Signed   By: Inez Catalina M.D.   On: 03/30/2018 11:52   Ct Maxillofacial Wo Contrast  Result Date: 03/30/2018 CLINICAL DATA:  Recent fall with left facial pain and headaches EXAM: CT HEAD WITHOUT CONTRAST CT MAXILLOFACIAL WITHOUT CONTRAST CT CERVICAL SPINE WITHOUT CONTRAST TECHNIQUE: Multidetector CT imaging of the head, cervical spine, and maxillofacial structures were performed using the standard protocol without intravenous contrast. Multiplanar CT image reconstructions of the cervical spine and maxillofacial structures were also generated. COMPARISON:  01/03/2018 FINDINGS: CT HEAD FINDINGS Brain: Changes consistent with prior left cerebellar infarct superiorly are seen. The previously seen right cerebellar infarct is less prominent on today's exam. No findings to suggest acute hemorrhage, acute infarction or space-occupying mass lesion are seen. Vascular: No hyperdense vessel or unexpected calcification. Skull: Normal. Negative for fracture or focal lesion. Other: None. CT  MAXILLOFACIAL FINDINGS Osseous: No fracture or mandibular dislocation. No destructive process. Orbits: Negative. No traumatic or inflammatory finding. Sinuses: Clear. Soft tissues: Minimal soft tissue swelling is noted in the left face related to the recent injury. No focal hematoma is seen. CT CERVICAL SPINE FINDINGS Alignment: Loss of the normal cervical lordosis is noted. Skull base and vertebrae: Seven cervical segments  are well visualized. No acute fracture or acute facet abnormality is noted. The odontoid is within normal limits. Soft tissues and spinal canal: No soft tissue abnormality is identified. Upper chest: Within normal limits. Other: None IMPRESSION: CT of the head: Chronic changes similar to that noted on the prior exam. No acute abnormality is identified. CT of the maxillofacial bones: Minimal soft tissue swelling. No acute bony abnormality is noted. CT of the cervical spine: No acute bony abnormality is noted Mild straightening of the normal cervical lordosis is noted which may be related to muscle spasm. Electronically Signed   By: Inez Catalina M.D.   On: 03/30/2018 11:52    ____________________________________________    PROCEDURES  Procedure(s) performed:    Procedures    Medications - No data to display   ____________________________________________   INITIAL IMPRESSION / ASSESSMENT AND PLAN / ED COURSE  Pertinent labs & imaging results that were available during my care of the patient were reviewed by me and considered in my medical decision making (see chart for details).  Review of the [place] CSRS was performed in accordance of the [place] prior to dispensing any controlled drugs.     Patient presents emergency department for evaluation after fall.  CT head consistent with chronic changes.  CT cervical spine shows straightening of the normal cervical lordosis, which I suspect is from muscle spasm.  CT maxillofacial shows minimal soft tissue swelling.  Blood work  remarkable for slightly increased creatinine of 1.58 and BUN of 29, which is similar to previous results in the last year.  Patient is requesting to go home.  Case was discussed with Dr. Corky Downs, who is agreeable with plan of care and discharge home.  Patient will be discharged home with prescriptions for tramadol and Flexeril.  Patient is aware that these medications may make him drowsy.  Patient is to follow up with primary care as directed. He is agreeable to call today for an appointment. Patient is given ED precautions to return to the ED for any worsening or new symptoms.     ____________________________________________  FINAL CLINICAL IMPRESSION(S) / ED DIAGNOSES  Final diagnoses:  Injury of head, initial encounter  Fall, initial encounter  Stage 3 chronic kidney disease ([place])      NEW MEDICATIONS STARTED DURING THIS VISIT:  ED Discharge Orders         Ordered    cyclobenzaprine (FLEXERIL) 5 MG tablet  3 times daily PRN     03/30/18 1314    traMADol (ULTRAM) 50 MG tablet  Every 6 hours PRN     03/30/18 1314              This chart was dictated using voice recognition software/Dragon. Despite best efforts to proofread, errors can occur which can change the meaning. Any change was purely unintentional.    Laban Emperor, PA-C 03/30/18 1548    Lavonia Drafts, MD 04/02/18 [phone]

## 2018-03-30 NOTE — ED Notes (Signed)
Patient went to ct scan. Will complete orders when he returns.

## 2018-04-01 ENCOUNTER — Other Ambulatory Visit: Payer: Self-pay

## 2018-04-01 NOTE — Patient Outreach (Signed)
Telephone outreach to patient to obtain mRs was successfully completed. mRs= 1. 

## 2018-04-02 ENCOUNTER — Ambulatory Visit: Payer: Medicare Other | Admitting: Cardiovascular Disease

## 2018-04-07 LAB — CUP PACEART REMOTE DEVICE CHECK
Battery Remaining Longevity: 131 mo
Battery Voltage: 3.02 V
Brady Statistic RV Percent Paced: 0.01 %
Date Time Interrogation Session: 20190919134156
HighPow Impedance: 70 Ohm
Implantable Lead Implant Date: 20180604
Implantable Lead Location: 753860
Implantable Lead Model: 293
Implantable Lead Serial Number: 433424
Lead Channel Impedance Value: 342 Ohm
Lead Channel Sensing Intrinsic Amplitude: 11.375 mV
Lead Channel Setting Pacing Amplitude: 3.75 V
Lead Channel Setting Pacing Pulse Width: 0.4 ms
Lead Channel Setting Sensing Sensitivity: 0.3 mV
MDC IDC MSMT LEADCHNL RV IMPEDANCE VALUE: 342 Ohm
MDC IDC MSMT LEADCHNL RV PACING THRESHOLD AMPLITUDE: 1.875 V
MDC IDC MSMT LEADCHNL RV PACING THRESHOLD PULSEWIDTH: 0.4 ms
MDC IDC MSMT LEADCHNL RV SENSING INTR AMPL: 11.375 mV
MDC IDC PG IMPLANT DT: 20180604

## 2018-04-08 ENCOUNTER — Ambulatory Visit: Payer: Medicare Other | Admitting: Family

## 2018-04-13 ENCOUNTER — Ambulatory Visit (INDEPENDENT_AMBULATORY_CARE_PROVIDER_SITE_OTHER): Payer: Medicare Other | Admitting: Family Medicine

## 2018-04-13 ENCOUNTER — Encounter: Payer: Self-pay | Admitting: Family Medicine

## 2018-04-13 VITALS — BP 138/80 | HR 48 | Temp 99.4°F | Resp 16 | Ht 72.0 in | Wt 294.0 lb

## 2018-04-13 DIAGNOSIS — E1121 Type 2 diabetes mellitus with diabetic nephropathy: Secondary | ICD-10-CM

## 2018-04-13 DIAGNOSIS — S149XXA Injury of unspecified nerves of neck, initial encounter: Secondary | ICD-10-CM

## 2018-04-13 DIAGNOSIS — M62838 Other muscle spasm: Secondary | ICD-10-CM

## 2018-04-13 DIAGNOSIS — G589 Mononeuropathy, unspecified: Secondary | ICD-10-CM

## 2018-04-13 LAB — POCT GLYCOSYLATED HEMOGLOBIN (HGB A1C): Hemoglobin A1C: 6.1 % — AB (ref 4.0–5.6)

## 2018-04-13 MED ORDER — CYCLOBENZAPRINE HCL 10 MG PO TABS: 5.0000 mg | ORAL_TABLET | Freq: Three times a day (TID) | ORAL | 1 refills | Status: DC | PRN

## 2018-04-13 MED ORDER — SEMAGLUTIDE(0.25 OR 0.5MG/DOS) 2 MG/1.5ML ~~LOC~~ SOPN: 0.5000 mg | PEN_INJECTOR | SUBCUTANEOUS | 3 refills | Status: DC

## 2018-04-13 MED ORDER — SEMAGLUTIDE(0.25 OR 0.5MG/DOS) 2 MG/1.5ML ~~LOC~~ SOPN: 0.5000 mg | PEN_INJECTOR | SUBCUTANEOUS | 0 refills | Status: DC

## 2018-04-13 MED ORDER — GABAPENTIN 100 MG PO CAPS: ORAL_CAPSULE | ORAL | 1 refills | Status: DC

## 2018-04-13 MED ORDER — PREDNISONE 10 MG PO TABS: ORAL_TABLET | ORAL | 0 refills | Status: DC

## 2018-04-13 NOTE — Patient Instructions (Addendum)
Thank you for coming to the office today.  A1c 6.1 today from last time 6.4, much better, keep up the great work!  You have a pinched nerve most likely in neck due to muscle spasm and fall injury. This is causing the pain shooting into arm.  - Start Prednisone taper (steroid anti-inflammatory) for nerve irritation with pain in legs. Each pill is 10mg . Take 6 pills (60mg  daily) for 1 day at same time with breakfast, then each day reduce dose by 1 pill, so 5 pills, then 4, then 3, then 2 then 1 (last 6 days)  Start Cyclobenzapine (Flexeril) 10mg  tablets (muscle relaxant) - start with half (cut) to one whole pill at night for muscle relaxant - may make you sedated or sleepy (be careful driving or working on this) if tolerated you can take half to whole tab 2 to 3 times daily or every 8 hours as needed  Stop Tramadol  Start Gabapentin 100mg  capsules, take at night for 2-3 nights only, and then increase to 2 times a day for a few days, and then may increase to 3 times a day, it may make you drowsy, if helps significantly at night only, then you can increase instead to 3 capsules at night, instead of 3 times a day - In the future if needed, we can significantly increase the dose if tolerated well, some common doses are 300mg  three times a day up to 600mg  three times a day, usually it takes several weeks or months to get to higher doses ----------------------------------------   Your provider would like to you have your annual eye exam. Please contact your current eye doctor or here are some good options for you to contact.   [facility]   Address: [address] [place], [place] [address] Phone: [phone]  Website: [email or web address]   [facility] [address] [place], [place] [address] Phone: [phone] [email or web address]  [facility]  Address: [address] [place], [place], [place] [address] Phone: [phone]   [facility] [address] [place],  [place] [address] Phone: [phone]  [facility] Address: [address] [place], [place], [place] [address]  Phone: [phone]   Please schedule a Follow-up Appointment to: Return in about 4 months (around 08/14/2018) for DM A1c, med adjust.  If you have any other questions or concerns, please feel free to call the office or send a message through MyChart. You may also schedule an earlier appointment if necessary.  Additionally, you may be receiving a survey about your experience at our office within a few days to 1 week by e-mail or mail. We value your feedback.  Saralyn Pilar, DO [facility], [place]

## 2018-04-13 NOTE — Progress Notes (Signed)
Subjective:    Patient ID: Kent Geralds., male    DOB: [birth year], 47 y.o.   MRN: [id number]  Kent Donate. is a 46 y.o. male presenting on 04/13/2018 for Diabetes   HPI   CHRONIC DM, Type 2 / Obesity BMI >39 Doing well. In interval on GLP1 and better lifestyle/diet, has lost 13-15 lbs CBGs: Avg 100-120s, Low >90, High <170. Checks CBGs daily Meds: Ozempic 0.5mg  weekly [place] injection - missed one dose due to inc dose too soon instead of 0.25 titration - He states medicare not covering for Ozempic will need PA - Off Amaryl Reports good compliance. Tolerating well w/o side-effects Currently on ARB Sherryll Burger) Lifestyle: - Significant wt loss now - Diet (reducing portions, low carb, heart healthy)  - Exercise (walking more regularly and exercise Denies hypoglycemia, polyuria, visual changes, numbness or tingling.  Fall / R neck pain and spasm with nerve symptoms Recently on 03/30/18 about 2+ weeks ago he fell while walking up stairs, hit L side of head and neck he states was surprised by fall and "tensed up" and thinks he sprained muscle or other injury. Seen in [facility] ED see imaging below, work-up done, ultimately thought some muscle spasm - Admits improving overall but still some persistent symptoms of R arm upper extremity nerve pain and tingling, worse with position of neck / arm if raised or laying on it or turn head to R - Admits neck pain = Prior history of CVA, but has had resolved baseline neuro status tingling since then Denies new weakness, numbness tingling, loss of vision syncopal episode  Health Maintenance: Due for Flu Shot, declines today despite counseling on benefits   Depression screen [facility] 2/9 04/13/2018 02/11/2018 01/28/2018  Decreased Interest 0 0 0  Down, Depressed, Hopeless 0 0 1  PHQ - 2 Score 0 0 1  Altered sleeping - - -  Tired, decreased energy - - -  Change in appetite - - -  Feeling bad or failure about yourself  - - -  Trouble concentrating -  - -  Moving slowly or fidgety/restless - - -  Suicidal thoughts - - -  PHQ-9 Score - - -  Difficult doing work/chores - - -    Social History   Tobacco Use  . Smoking status: Former Smoker    Types: Cigars    Last attempt to quit: 01/02/2018    Years since quitting: 0.2  . Smokeless tobacco: Former Neurosurgeon  . Tobacco comment: smokes ~10 black and milds per week  Substance Use Topics  . Alcohol use: Yes    Comment: 8-9 beers a month per pt  . Drug use: No    Review of Systems Per HPI unless specifically indicated above     Objective:    BP 138/80   Pulse (!) 48   Temp 99.4 F (37.4 C) (Oral)   Resp 16   Ht 6' (1.829 m)   Wt 294 lb (133.4 kg)   BMI 39.87 kg/m   Wt Readings from Last 3 Encounters:  04/13/18 294 lb (133.4 kg)  03/30/18 294 lb (133.4 kg)  03/02/18 (!) 306 lb 8 oz (139 kg)    Physical Exam  Constitutional: He is oriented to person, place, and time. He appears well-developed and well-nourished. No distress.  Well-appearing, comfortable, cooperative, obese with some weight loss  HENT:  Head: Normocephalic and atraumatic.  Mouth/Throat: Oropharynx is clear and moist.  Eyes: Conjunctivae are normal. Right eye exhibits no  discharge. Left eye exhibits no discharge.  Neck:  Neck Inspection palpation: mild hypertonicity of neck paraspinal muscles R>L, mild tender to palpation deeper ROM: limited R rotation Special Testing: Spurling's maneuver positive reproduced radiating pain in R arm Strength: distal intact Neurovascular: distal intact upper ext   Cardiovascular: Normal rate, regular rhythm, normal heart sounds and intact distal pulses.  No murmur heard. Pulmonary/Chest: Effort normal and breath sounds normal. No respiratory distress. He has no wheezes. He has no rales.  Musculoskeletal: Normal range of motion. He exhibits no edema.  Right Shoulder Inspection: Normal appearance bilateral symmetrical Palpation: Non-tender to palpation over anterior,  lateral, or posterior shoulder  ROM: Full intact active ROM forward flexion, abduction, internal mild discomfort internal rotation behind back Special Testing: Rotator cuff testing negative for weakness with supraspinatus full can and empty can test, Hawkin's AC impingement negative for pain Strength: Normal strength 5/5 flex/ext, ext rot / int rot, grip, rotator cuff str testing. Neurovascular: Distally intact pulses, sensation to light touch   Neurological: He is alert and oriented to person, place, and time.  Skin: Skin is warm and dry. No rash noted. He is not diaphoretic. No erythema.  Psychiatric: He has a normal mood and affect. His behavior is normal.  Well groomed, good eye contact, normal speech and thoughts  Nursing note and vitals reviewed.    I have personally reviewed the radiology report from 03/30/18  EXAM: CT HEAD WITHOUT CONTRAST  CT MAXILLOFACIAL WITHOUT CONTRAST  CT CERVICAL SPINE WITHOUT CONTRAST  TECHNIQUE: Multidetector CT imaging of the head, cervical spine, and maxillofacial structures were performed using the standard protocol without intravenous contrast. Multiplanar CT image reconstructions of the cervical spine and maxillofacial structures were also generated.  COMPARISON: 01/03/2018  FINDINGS: CT HEAD FINDINGS  Brain: Changes consistent with prior left cerebellar infarct superiorly are seen. The previously seen right cerebellar infarct is less prominent on today's exam. No findings to suggest acute hemorrhage, acute infarction or space-occupying mass lesion are seen.  Vascular: No hyperdense vessel or unexpected calcification.  Skull: Normal. Negative for fracture or focal lesion.  Other: None.  CT MAXILLOFACIAL FINDINGS  Osseous: No fracture or mandibular dislocation. No destructive process.  Orbits: Negative. No traumatic or inflammatory finding.  Sinuses: Clear.  Soft tissues: Minimal soft tissue swelling is noted in the left  face related to the recent injury. No focal hematoma is seen.  CT CERVICAL SPINE FINDINGS  Alignment: Loss of the normal cervical lordosis is noted.  Skull base and vertebrae: Seven cervical segments are well visualized. No acute fracture or acute facet abnormality is noted. The odontoid is within normal limits.  Soft tissues and spinal canal: No soft tissue abnormality is identified.  Upper chest: Within normal limits.  Other: None  IMPRESSION: CT of the head: Chronic changes similar to that noted on the prior exam. No acute abnormality is identified.  CT of the maxillofacial bones: Minimal soft tissue swelling. No acute bony abnormality is noted.  CT of the cervical spine: No acute bony abnormality is noted  Mild straightening of the normal cervical lordosis is noted which may be related to muscle spasm.   Electronically Signed By: Alcide Clever M.D. On: 03/30/2018 11:52  Recent Labs    01/02/18 0301 04/13/18 1606  HGBA1C 6.4* 6.1*    Results for orders placed or performed in visit on 04/13/18  POCT HgB A1C  Result Value Ref Range   Hemoglobin A1C 6.1 (A) 4.0 - 5.6 %  Assessment & Plan:   Problem List Items Addressed This Visit    Type 2 diabetes mellitus with nephropathy (HCC) - Primary    Further improved DM control, A1c down to 6.1 from 6.4, previously uncontrol Complications - CKD-II to III with microalbuminuria, diabetic neuropathy mild, other including hyperlipidemia, morbid obesity, OSA - increases risk of future cardiovascular complications OFF Amaryl, contraindicated metformin due to CHF  Plan:  1. Continue GLP1 Ozempic 0.5mg  weekly [place] injection, CV benefits, A1c wt loss - given another 1 pen sample since he has had medicare coverage issues, I am unsure if he has contacted Medicare Extra Help for financial assistance. May need to switch GLP1 agent if not covered. - New rx Ozempic sent to pharmacy - Remain off Amaryl Encourage improved lifestyle  - low carb, low sugar diet, reduce portion size, continue improving regular exercise Check CBG, bring log to next visit for review Continue anticoagulation, Entresto, Statin Again - Advised to schedule DM ophtho exam, send record - handout given Follow-up 4 mo DM A1c      Relevant Medications   Semaglutide,0.25 or 0.5MG /DOS, (OZEMPIC, 0.25 OR 0.5 MG/DOSE,) 2 MG/1.5ML SOPN   Other Relevant Orders   POCT HgB A1C (Completed)    Other Visit Diagnoses    Muscle spasms of neck       Relevant Medications   predniSONE (DELTASONE) 10 MG tablet   gabapentin (NEURONTIN) 100 MG capsule   cyclobenzaprine (FLEXERIL) 10 MG tablet   Pinched nerve in neck       Relevant Medications   predniSONE (DELTASONE) 10 MG tablet   gabapentin (NEURONTIN) 100 MG capsule   cyclobenzaprine (FLEXERIL) 10 MG tablet      Persistent R>L cervical paraspinal muscle spasm with whiplash injury from initial fall injury 2 weeks ago. Some radicular symptoms radiating to R upper arm it seems. - Last imaging, C-spine CT w/o acute injury some loss of lordosis suggestive - Improved on tramadol / flexeril  Plan Refill Flexeril PRN Start Prednisone taper short term,  Discussed risks with sugar, BP, fluid Start gabapentin titration Follow-up may need PT if not improving   Meds ordered this encounter  Medications  . predniSONE (DELTASONE) 10 MG tablet    Sig: Take 6 tabs with breakfast Day 1, 5 tabs Day 2, 4 tabs Day 3, 3 tabs Day 4, 2 tabs Day 5, 1 tab Day 6.    Dispense:  21 tablet    Refill:  0  . gabapentin (NEURONTIN) 100 MG capsule    Sig: Start 1 capsule daily, increase by 1 cap every 2-3 days as tolerated up to 3 times a day, or may take 3 at once in evening.    Dispense:  90 capsule    Refill:  1  . cyclobenzaprine (FLEXERIL) 10 MG tablet    Sig: Take 0.5-1 tablets (5-10 mg total) by mouth 3 (three) times daily as needed for muscle spasms.    Dispense:  30 tablet    Refill:  1  . Semaglutide,0.25 or  0.5MG /DOS, (OZEMPIC, 0.25 OR 0.5 MG/DOSE,) 2 MG/1.5ML SOPN    Sig: Inject 0.5 mg into the skin once a week. For first 4 weeks, inject dose 0.25mg  weekly    Dispense:  1 pen    Refill:  3  . DISCONTD: Semaglutide,0.25 or 0.5MG /DOS, (OZEMPIC, 0.25 OR 0.5 MG/DOSE,) 2 MG/1.5ML SOPN    Sig: Inject 0.5 mg into the skin once a week.    Dispense:  1 pen    Refill:  0    Follow up plan: Return in about 4 months (around 08/14/2018) for DM A1c, med adjust.  Saralyn Pilar, DO [facility] 04/13/2018, 7:19 PM

## 2018-04-14 NOTE — Assessment & Plan Note (Signed)
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 Continue GLP1 Ozempic 0.5mg  weekly [place] injection, CV benefits, A1c wt loss - given another 1 pen sample since he has had medicare coverage issues, I am unsure if he has contacted Medicare Extra Help for financial assistance. May need to switch GLP1 agent if not covered. - New rx Ozempic sent to pharmacy - Remain off Amaryl Encourage improved lifestyle - low carb, low sugar diet, reduce portion size, continue improving regular exercise Check CBG, bring log to next visit for review Continue anticoagulation, Entresto, Statin Again - Advised to schedule DM ophtho exam, send record - handout given Follow-up 4 mo DM A1c

## 2018-04-18 NOTE — Progress Notes (Signed)
Patient ID: Kent Stanley., male    DOB: 1970/10/12, 47 y.o.   MRN: [id number]  HPI  Kent Stanley is a 47 y/o male with a history of stroke, CKD stage 2, CAD, hyperlipidemia, HTN, morbid obesity, polysubstance abuse in the past, prior tobacco use, seizures, DM, ICD implantation and chronic heart failure.  Echo done 01/02/18 reviewed and showed an EF of 20-25% along with moderate Kent, trivial TR and PA pressure of 22 mm Hg. Echo done 10/24/16 which showed an EF of 15-20%. EF has declined from previous echo which was done 03/28/16 which showed an EF of 20-25% without regurgitation. Last catheterization was done March 2016.  Was in the ED 03/30/18 due to head injury after a fall. Head CT unchanged and he was released. Admitted 01/01/18 due to stroke. Received IV TPA. Discharged after 3 days. Was in the ED 01/01/18 due to a stroke where he was transferred to [facility] for further treatment.   He presents today for a follow-up visit with a chief complaint of chronic angina. He says that this has been present for many years and isn't any better or worse. He has associated neuropathy in his fingertips at times. He denies any difficulty sleeping, abdominal distention, palpitations, pedal edema, shortness of breath, cough, dizziness or weight gain.   Past Medical History:  Diagnosis Date  . Chronic systolic heart failure ([place])    a. 10/2012 Echo: EF <20%, mod to sev dil LV, diast dysfxn, mildly dil LA/RA, mod to severe Kent/TR.  [name] Coronary artery disease, non-occlusive    a. 09/2014 Cath: minor luminal irregularities with severely dilated left ventricle. LVEDP 30.  [name] H/O noncompliance with medical treatment, presenting hazards to health   . History of traumatic injury of head   . Hyperlipidemia   . Hypertension   . Morbid obesity ([place])   . NICM (nonischemic cardiomyopathy) ([place])    a. 10/2012 Echo: EF <20%.  . Polysubstance abuse ([place])    a. cocaine, tobacco, THC, ETOH  . Seizure disorder ([place])   . Stroke  [facility])    Past Surgical History:  Procedure Laterality Date  . CARDIAC CATHETERIZATION     [facility]  . ICD IMPLANT N/A 12/08/2016   Procedure: ICD Implant;  Surgeon: Deboraha Sprang, MD;  Location: [place] [facility];  Service: Cardiovascular;  Laterality: N/A;   Family History  Problem Relation Age of Onset  . Diabetes Mother   . Brain cancer Mother   . Heart attack Mother   . Sleep apnea Mother   . Heart failure Mother   . Hypertension Father   . Diabetes Father   . Heart disease Father   . Hyperlipidemia Father   . Heart attack Father   . Heart failure Father   . Sleep apnea Father   . Sleep apnea Sister   . Sleep apnea Brother   . Heart failure Maternal Aunt    Social History   Tobacco Use  . Smoking status: Former Smoker    Types: Cigars    Last attempt to quit: 01/02/2018    Years since quitting: 0.2  . Smokeless tobacco: Former Systems developer  . Tobacco comment: smokes ~10 black and milds per week  Substance Use Topics  . Alcohol use: Yes    Comment: 8-9 beers a month per pt   Allergies  Allergen Reactions  . Tomato Rash   Prior to Admission medications   Medication Sig Start Date End Date Taking? Authorizing Provider  ACCU-CHEK  AVIVA PLUS test strip Check blood sugar up to 1 x daily 01/18/18  Yes Karamalegos, [name], DO  ACCU-CHEK SOFTCLIX LANCETS lancets Check blood sugar up to 1 x daily 01/18/18  Yes Karamalegos, [name], DO  apixaban (ELIQUIS) 5 MG TABS tablet Take 1 tablet (5 mg total) by mouth 2 (two) times daily. 01/04/18  Yes Rosalin Hawking, MD  blood glucose meter kit and supplies KIT Dispense based on patient and insurance preference. Check blood glucose once daily. 08/21/15  Yes [name], Genevie Cheshire, NP  Blood Glucose Monitoring Suppl (ACCU-CHEK AVIVA PLUS) w/Device KIT Check blood sugar up to 1 x daily 01/18/18  Yes Karamalegos, [name], DO  cyclobenzaprine (FLEXERIL) 10 MG tablet Take 0.5-1 tablets (5-10 mg total) by mouth 3 (three) times daily as needed for  muscle spasms. 04/13/18  Yes Karamalegos, [name], DO  ENTRESTO 49-51 MG take 1 tablet by mouth twice a day 11/25/16  Yes [name], FNP  furosemide (LASIX) 40 MG tablet Take '40mg'$  in morning. Take half tab for dose 20 mg tablet every evening. 01/25/18  Yes [name], FNP  gabapentin (NEURONTIN) 100 MG capsule Start 1 capsule daily, increase by 1 cap every 2-3 days as tolerated up to 3 times a day, or may take 3 at once in evening. 04/13/18  Yes Karamalegos, [name], DO  metoprolol succinate (TOPROL-XL) 25 MG 24 hr tablet Take 1 tablet (25 mg total) by mouth daily. 03/11/18  Yes Karamalegos, [name], DO  pravastatin (PRAVACHOL) 40 MG tablet TAKE 2 TABLETS BY MOUTH ONCE DAILY 03/11/18  Yes Karamalegos, [name], DO  predniSONE (DELTASONE) 10 MG tablet Take 6 tabs with breakfast Day 1, 5 tabs Day 2, 4 tabs Day 3, 3 tabs Day 4, 2 tabs Day 5, 1 tab Day 6. 04/13/18  Yes Karamalegos, [name], DO  Semaglutide,0.25 or 0.'5MG'$ /DOS, (OZEMPIC, 0.25 OR 0.5 MG/DOSE,) 2 MG/1.5ML SOPN Inject 0.5 mg into the skin once a week. For first 4 weeks, inject dose 0.'25mg'$  weekly 04/13/18  Yes Karamalegos, [name], DO  sildenafil (REVATIO) 20 MG tablet Take 5 tablets (100 mg total) by mouth as needed. Take about 30 min prior to sex. Caution stop taking if chest pain. Do not take with Nitro. 11/23/17  Yes Karamalegos, [name], DO  spironolactone (ALDACTONE) 25 MG tablet Take 0.5 tablets (12.5 mg total) by mouth daily. 04/23/17  Yes Karamalegos, [name], DO    Review of Systems  Constitutional: Negative for appetite change and fatigue.  HENT: Negative for congestion, postnasal drip and sore throat.   Eyes: Negative.   Respiratory: Negative for cough, chest tightness, shortness of breath and wheezing.   Cardiovascular: Positive for chest pain ("chronic angina"). Negative for palpitations and leg swelling.  Gastrointestinal: Negative for abdominal distention and abdominal pain.  Endocrine:  Negative.   Genitourinary: Negative.   Musculoskeletal: Negative for back pain and neck pain.  Skin: Negative.   Allergic/Immunologic: Negative.   Neurological: Positive for numbness (tips of fingers at times). Negative for dizziness and light-headedness.  Hematological: Negative for adenopathy. Does not bruise/bleed easily.  Psychiatric/Behavioral: Negative for dysphoric mood and sleep disturbance (slept with CPAP last night for 9 hours). The patient is not nervous/anxious.    Vitals:   04/20/18 1040  BP: 111/79  Pulse: 87  Resp: 18  SpO2: 97%  Weight: 295 lb 8 oz (134 kg)  Height: 6' (1.829 m)   Wt Readings from Last 3 Encounters:  04/20/18 295 lb 8  oz (134 kg)  04/13/18 294 lb (133.4 kg)  03/30/18 294 lb (133.4 kg)   Lab Results  Component Value Date   CREATININE 1.58 (H) 03/30/2018   CREATININE 1.30 (H) 01/04/2018   CREATININE 1.35 (H) 01/03/2018    Physical Exam  Constitutional: He is oriented to person, place, and time. He appears well-developed and well-nourished.  HENT:  Head: Normocephalic and atraumatic.  Neck: Normal range of motion. Neck supple. No JVD present.  Cardiovascular: Normal rate and regular rhythm.  Pulmonary/Chest: Effort normal. He has no wheezes. He has no rales.  Abdominal: Soft. He exhibits no distension. There is no tenderness.  Musculoskeletal: He exhibits no edema or tenderness.  Neurological: He is alert and oriented to person, place, and time.  Skin: Skin is warm and dry.  Psychiatric: He has a normal mood and affect. His behavior is normal. Thought content normal.  Nursing note and vitals reviewed.   Assessment & Plan:  1: Chronic heart failure with reduced ejection fraction- - NYHA Class I - euvolemic today - continue weighing daily; To call for an overnight weight gain of >2 pounds or a weekly weight gain of >5 pounds. - weight down 9 pounds from last visit  - Using Mrs. Dash and trying to follow a '2000mg'$  sodium diet - riding  stationary bike  - Saw cardiologist Fletcher [name]) 09/23/16 - saw pulmonologist Ashby Dawes) 01/25/18 - saw EP [name]) 03/02/18 - BNP 02/12/16 was 403.8 - does not meet ReDS criteria due to BMI - not sure current BP could tolerate titration of entreso - could also titrate up metoprolol succinate in the future as well if able - does not receive the flu vaccine  2: HTN- - BP looks good today - saw PCP Jerene Pitch) 04/13/18 - BMP 03/30/18 reviewed and showed sodium 137, potassium 3.7, creatinine 1.58 and GFR 58  3: Diabetes- - glucose at home last night was 132 - A1c 04/13/18 was 6.1%  4: Tobacco use- - has smoked infrequently since last here - drinks ~ 3-4 beers every few days - complete cessation discussed for 3 minutes with him  Patient did not bring his medications nor a list. Each medication was verbally reviewed with the patient and he was encouraged to bring the bottles to every visit to confirm accuracy of list.  Return in 6 months or sooner for any questions/problems before then.

## 2018-04-20 ENCOUNTER — Ambulatory Visit: Payer: Medicare Other | Attending: Family | Admitting: Family

## 2018-04-20 ENCOUNTER — Encounter: Payer: Self-pay | Admitting: Family

## 2018-04-20 VITALS — BP 111/79 | HR 87 | Resp 18 | Ht 72.0 in | Wt 295.5 lb

## 2018-04-20 DIAGNOSIS — G40909 Epilepsy, unspecified, not intractable, without status epilepticus: Secondary | ICD-10-CM | POA: Insufficient documentation

## 2018-04-20 DIAGNOSIS — N182 Chronic kidney disease, stage 2 (mild): Secondary | ICD-10-CM | POA: Diagnosis not present

## 2018-04-20 DIAGNOSIS — Z8673 Personal history of transient ischemic attack (TIA), and cerebral infarction without residual deficits: Secondary | ICD-10-CM | POA: Insufficient documentation

## 2018-04-20 DIAGNOSIS — Z7901 Long term (current) use of anticoagulants: Secondary | ICD-10-CM | POA: Diagnosis not present

## 2018-04-20 DIAGNOSIS — F17290 Nicotine dependence, other tobacco product, uncomplicated: Secondary | ICD-10-CM | POA: Diagnosis not present

## 2018-04-20 DIAGNOSIS — E1122 Type 2 diabetes mellitus with diabetic chronic kidney disease: Secondary | ICD-10-CM | POA: Diagnosis not present

## 2018-04-20 DIAGNOSIS — Z6841 Body Mass Index (BMI) 40.0 and over, adult: Secondary | ICD-10-CM | POA: Insufficient documentation

## 2018-04-20 DIAGNOSIS — E1121 Type 2 diabetes mellitus with diabetic nephropathy: Secondary | ICD-10-CM

## 2018-04-20 DIAGNOSIS — E785 Hyperlipidemia, unspecified: Secondary | ICD-10-CM | POA: Diagnosis not present

## 2018-04-20 DIAGNOSIS — F17200 Nicotine dependence, unspecified, uncomplicated: Secondary | ICD-10-CM

## 2018-04-20 DIAGNOSIS — Z8249 Family history of ischemic heart disease and other diseases of the circulatory system: Secondary | ICD-10-CM | POA: Diagnosis not present

## 2018-04-20 DIAGNOSIS — I5022 Chronic systolic (congestive) heart failure: Secondary | ICD-10-CM | POA: Insufficient documentation

## 2018-04-20 DIAGNOSIS — Z79899 Other long term (current) drug therapy: Secondary | ICD-10-CM | POA: Diagnosis not present

## 2018-04-20 DIAGNOSIS — I130 Hypertensive heart and chronic kidney disease with heart failure and stage 1 through stage 4 chronic kidney disease, or unspecified chronic kidney disease: Secondary | ICD-10-CM | POA: Diagnosis not present

## 2018-04-20 DIAGNOSIS — I2510 Atherosclerotic heart disease of native coronary artery without angina pectoris: Secondary | ICD-10-CM | POA: Insufficient documentation

## 2018-04-20 DIAGNOSIS — I10 Essential (primary) hypertension: Secondary | ICD-10-CM

## 2018-04-20 NOTE — Patient Instructions (Signed)
Continue weighing daily and call for an overnight weight gain of > 2 pounds or a weekly weight gain of >5 pounds. 

## 2018-04-21 ENCOUNTER — Encounter: Payer: Self-pay | Admitting: Family

## 2018-04-26 ENCOUNTER — Ambulatory Visit: Payer: Medicare Other | Admitting: Family Medicine

## 2018-05-18 ENCOUNTER — Other Ambulatory Visit: Payer: Self-pay

## 2018-05-18 ENCOUNTER — Encounter: Payer: Self-pay | Admitting: Emergency Medicine

## 2018-05-18 ENCOUNTER — Observation Stay
Admission: EM | Admit: 2018-05-18 | Discharge: 2018-05-19 | Disposition: A | Discharge status: Home / Self Care | Payer: Medicare Other | Attending: Internal Medicine | Admitting: Internal Medicine

## 2018-05-18 ENCOUNTER — Emergency Department: Payer: Medicare Other

## 2018-05-18 ENCOUNTER — Inpatient Hospital Stay (HOSPITAL_COMMUNITY)
Admit: 2018-05-18 | Discharge: 2018-05-18 | Disposition: A | Discharge status: Home / Self Care | Payer: Medicare Other | Attending: Physician Assistant | Admitting: Physician Assistant

## 2018-05-18 DIAGNOSIS — I214 Non-ST elevation (NSTEMI) myocardial infarction: Secondary | ICD-10-CM | POA: Diagnosis not present

## 2018-05-18 DIAGNOSIS — R7989 Other specified abnormal findings of blood chemistry: Secondary | ICD-10-CM | POA: Diagnosis not present

## 2018-05-18 DIAGNOSIS — Z6841 Body Mass Index (BMI) 40.0 and over, adult: Secondary | ICD-10-CM | POA: Insufficient documentation

## 2018-05-18 DIAGNOSIS — G40909 Epilepsy, unspecified, not intractable, without status epilepticus: Secondary | ICD-10-CM | POA: Diagnosis not present

## 2018-05-18 DIAGNOSIS — I25110 Atherosclerotic heart disease of native coronary artery with unstable angina pectoris: Secondary | ICD-10-CM | POA: Diagnosis present

## 2018-05-18 DIAGNOSIS — E785 Hyperlipidemia, unspecified: Secondary | ICD-10-CM | POA: Diagnosis not present

## 2018-05-18 DIAGNOSIS — G4733 Obstructive sleep apnea (adult) (pediatric): Secondary | ICD-10-CM | POA: Insufficient documentation

## 2018-05-18 DIAGNOSIS — Z7982 Long term (current) use of aspirin: Secondary | ICD-10-CM | POA: Diagnosis not present

## 2018-05-18 DIAGNOSIS — Z87891 Personal history of nicotine dependence: Secondary | ICD-10-CM | POA: Insufficient documentation

## 2018-05-18 DIAGNOSIS — I5042 Chronic combined systolic (congestive) and diastolic (congestive) heart failure: Secondary | ICD-10-CM | POA: Insufficient documentation

## 2018-05-18 DIAGNOSIS — I472 Ventricular tachycardia: Secondary | ICD-10-CM | POA: Diagnosis not present

## 2018-05-18 DIAGNOSIS — E1165 Type 2 diabetes mellitus with hyperglycemia: Secondary | ICD-10-CM | POA: Insufficient documentation

## 2018-05-18 DIAGNOSIS — I5022 Chronic systolic (congestive) heart failure: Secondary | ICD-10-CM | POA: Diagnosis not present

## 2018-05-18 DIAGNOSIS — Z7901 Long term (current) use of anticoagulants: Secondary | ICD-10-CM

## 2018-05-18 DIAGNOSIS — Z8249 Family history of ischemic heart disease and other diseases of the circulatory system: Secondary | ICD-10-CM | POA: Diagnosis not present

## 2018-05-18 DIAGNOSIS — I2510 Atherosclerotic heart disease of native coronary artery without angina pectoris: Secondary | ICD-10-CM | POA: Diagnosis not present

## 2018-05-18 DIAGNOSIS — R42 Dizziness and giddiness: Secondary | ICD-10-CM | POA: Insufficient documentation

## 2018-05-18 DIAGNOSIS — Z79899 Other long term (current) drug therapy: Secondary | ICD-10-CM | POA: Insufficient documentation

## 2018-05-18 DIAGNOSIS — I130 Hypertensive heart and chronic kidney disease with heart failure and stage 1 through stage 4 chronic kidney disease, or unspecified chronic kidney disease: Secondary | ICD-10-CM | POA: Diagnosis not present

## 2018-05-18 DIAGNOSIS — R0789 Other chest pain: Secondary | ICD-10-CM | POA: Diagnosis not present

## 2018-05-18 DIAGNOSIS — I428 Other cardiomyopathies: Secondary | ICD-10-CM | POA: Insufficient documentation

## 2018-05-18 DIAGNOSIS — I200 Unstable angina: Secondary | ICD-10-CM | POA: Diagnosis present

## 2018-05-18 DIAGNOSIS — Z7984 Long term (current) use of oral hypoglycemic drugs: Secondary | ICD-10-CM | POA: Diagnosis not present

## 2018-05-18 DIAGNOSIS — N183 Chronic kidney disease, stage 3 (moderate): Secondary | ICD-10-CM | POA: Insufficient documentation

## 2018-05-18 DIAGNOSIS — I252 Old myocardial infarction: Secondary | ICD-10-CM | POA: Insufficient documentation

## 2018-05-18 DIAGNOSIS — R079 Chest pain, unspecified: Secondary | ICD-10-CM | POA: Diagnosis not present

## 2018-05-18 DIAGNOSIS — I480 Paroxysmal atrial fibrillation: Secondary | ICD-10-CM | POA: Diagnosis not present

## 2018-05-18 DIAGNOSIS — I69398 Other sequelae of cerebral infarction: Secondary | ICD-10-CM | POA: Insufficient documentation

## 2018-05-18 DIAGNOSIS — E1122 Type 2 diabetes mellitus with diabetic chronic kidney disease: Secondary | ICD-10-CM | POA: Diagnosis not present

## 2018-05-18 DIAGNOSIS — I248 Other forms of acute ischemic heart disease: Secondary | ICD-10-CM | POA: Diagnosis not present

## 2018-05-18 DIAGNOSIS — Z95810 Presence of automatic (implantable) cardiac defibrillator: Secondary | ICD-10-CM | POA: Insufficient documentation

## 2018-05-18 LAB — URINE DRUG SCREEN, QUALITATIVE (ARMC ONLY)
Amphetamines, Ur Screen: NOT DETECTED
Barbiturates, Ur Screen: NOT DETECTED
Benzodiazepine, Ur Scrn: NOT DETECTED
Cannabinoid 50 Ng, Ur ~~LOC~~: NOT DETECTED
Cocaine Metabolite,Ur ~~LOC~~: NOT DETECTED
MDMA (Ecstasy)Ur Screen: NOT DETECTED
Methadone Scn, Ur: NOT DETECTED
Opiate, Ur Screen: NOT DETECTED
PHENCYCLIDINE (PCP) UR S: NOT DETECTED
Tricyclic, Ur Screen: NOT DETECTED

## 2018-05-18 LAB — CBC WITH DIFFERENTIAL/PLATELET
Abs Immature Granulocytes: 0.04 10*3/uL (ref 0.00–0.07)
Basophils Absolute: 0.1 10*3/uL (ref 0.0–0.1)
Basophils Relative: 1 %
EOS ABS: 0.2 10*3/uL (ref 0.0–0.5)
Eosinophils Relative: 2 %
HEMATOCRIT: 42.2 % (ref 39.0–52.0)
Hemoglobin: 14.3 g/dL (ref 13.0–17.0)
IMMATURE GRANULOCYTES: 0 %
LYMPHS ABS: 2.5 10*3/uL (ref 0.7–4.0)
Lymphocytes Relative: 26 %
MCH: 32.4 pg (ref 26.0–34.0)
MCHC: 33.9 g/dL (ref 30.0–36.0)
MCV: 95.5 fL (ref 80.0–100.0)
Monocytes Absolute: 0.9 10*3/uL (ref 0.1–1.0)
Monocytes Relative: 10 %
NEUTROS PCT: 61 %
NRBC: 0 % (ref 0.0–0.2)
Neutro Abs: 5.8 10*3/uL (ref 1.7–7.7)
Platelets: 227 10*3/uL (ref 150–400)
RBC: 4.42 MIL/uL (ref 4.22–5.81)
RDW: 12.3 % (ref 11.5–15.5)
WBC: 9.4 10*3/uL (ref 4.0–10.5)

## 2018-05-18 LAB — COMPREHENSIVE METABOLIC PANEL
ALBUMIN: 3.9 g/dL (ref 3.5–5.0)
ALT: 23 U/L (ref 0–44)
AST: 25 U/L (ref 15–41)
Alkaline Phosphatase: 34 U/L — ABNORMAL LOW (ref 38–126)
Anion gap: 10 (ref 5–15)
BILIRUBIN TOTAL: 1 mg/dL (ref 0.3–1.2)
BUN: 25 mg/dL — AB (ref 6–20)
CO2: 25 mmol/L (ref 22–32)
Calcium: 8.7 mg/dL — ABNORMAL LOW (ref 8.9–10.3)
Chloride: 106 mmol/L (ref 98–111)
Creatinine, Ser: 1.56 mg/dL — ABNORMAL HIGH (ref 0.61–1.24)
GFR calc Af Amer: 59 mL/min — ABNORMAL LOW (ref >60)
GFR calc non Af Amer: 51 mL/min — ABNORMAL LOW (ref >60)
GLUCOSE: 104 mg/dL — AB (ref 70–99)
POTASSIUM: 3.8 mmol/L (ref 3.5–5.1)
SODIUM: 141 mmol/L (ref 135–145)
Total Protein: 7.5 g/dL (ref 6.5–8.1)

## 2018-05-18 LAB — GLUCOSE, CAPILLARY
GLUCOSE-CAPILLARY: 92 mg/dL (ref 70–99)
GLUCOSE-CAPILLARY: 92 mg/dL (ref 70–99)
Glucose-Capillary: 137 mg/dL — ABNORMAL HIGH (ref 70–99)
Glucose-Capillary: 82 mg/dL (ref 70–99)

## 2018-05-18 LAB — TROPONIN I
TROPONIN I: 0.06 ng/mL — AB (ref <0.03)
Troponin I: 0.05 ng/mL (ref <0.03)
Troponin I: 0.05 ng/mL (ref <0.03)
Troponin I: 0.05 ng/mL (ref <0.03)
Troponin I: 0.06 ng/mL (ref <0.03)

## 2018-05-18 LAB — APTT
APTT: 52 s — AB (ref 24–36)
aPTT: 33 seconds (ref 24–36)

## 2018-05-18 LAB — HEPARIN LEVEL (UNFRACTIONATED): HEPARIN UNFRACTIONATED: 1.37 [IU]/mL — AB (ref 0.30–0.70)

## 2018-05-18 LAB — PHOSPHORUS: Phosphorus: 3.5 mg/dL (ref 2.5–4.6)

## 2018-05-18 LAB — LIPASE, BLOOD: Lipase: 33 U/L (ref 11–51)

## 2018-05-18 LAB — ECHOCARDIOGRAM COMPLETE
HEIGHTINCHES: 72 in
WEIGHTICAEL: 4768 [oz_av]

## 2018-05-18 LAB — MAGNESIUM: Magnesium: 1.9 mg/dL (ref 1.7–2.4)

## 2018-05-18 LAB — PROTIME-INR
INR: 1.18
PROTHROMBIN TIME: 14.9 s (ref 11.4–15.2)

## 2018-05-18 MED ORDER — ASPIRIN 81 MG PO CHEW
81.0000 mg | CHEWABLE_TABLET | Freq: Every day | ORAL | Status: DC
  Filled 2018-05-18 (×2): qty 1

## 2018-05-18 MED ORDER — INSULIN ASPART 100 UNIT/ML ~~LOC~~ SOLN: 0.0000 [IU] | Freq: Every day | SUBCUTANEOUS | Status: DC

## 2018-05-18 MED ORDER — FUROSEMIDE 40 MG PO TABS
40.0000 mg | ORAL_TABLET | Freq: Every day | ORAL | Status: DC
  Administered 2018-05-19: 40 mg via ORAL
  Filled 2018-05-18: qty 1

## 2018-05-18 MED ORDER — PRAVASTATIN SODIUM 40 MG PO TABS
80.0000 mg | ORAL_TABLET | Freq: Every day | ORAL | Status: DC
  Administered 2018-05-18: 80 mg via ORAL
  Filled 2018-05-18 (×2): qty 2

## 2018-05-18 MED ORDER — HEPARIN (PORCINE) 25000 UT/250ML-% IV SOLN
1600.0000 [IU]/h | INTRAVENOUS | Status: DC
  Administered 2018-05-18: 1400 [IU]/h via INTRAVENOUS
  Administered 2018-05-19: 1600 [IU]/h via INTRAVENOUS
  Filled 2018-05-18 (×2): qty 250

## 2018-05-18 MED ORDER — NITROGLYCERIN 0.4 MG SL SUBL: 0.4000 mg | SUBLINGUAL_TABLET | SUBLINGUAL | Status: DC | PRN

## 2018-05-18 MED ORDER — SACUBITRIL-VALSARTAN 49-51 MG PO TABS: 1.0000 | ORAL_TABLET | Freq: Two times a day (BID) | ORAL | Status: DC

## 2018-05-18 MED ORDER — SENNOSIDES-DOCUSATE SODIUM 8.6-50 MG PO TABS: 1.0000 | ORAL_TABLET | Freq: Every evening | ORAL | Status: DC | PRN

## 2018-05-18 MED ORDER — BISACODYL 5 MG PO TBEC
5.0000 mg | DELAYED_RELEASE_TABLET | Freq: Every day | ORAL | Status: DC | PRN
  Filled 2018-05-18: qty 1

## 2018-05-18 MED ORDER — METOPROLOL SUCCINATE ER 25 MG PO TB24: 25.0000 mg | ORAL_TABLET | Freq: Every day | ORAL | Status: DC

## 2018-05-18 MED ORDER — ACETAMINOPHEN 325 MG PO TABS: 650.0000 mg | ORAL_TABLET | ORAL | Status: DC | PRN

## 2018-05-18 MED ORDER — SPIRONOLACTONE 25 MG PO TABS
12.5000 mg | ORAL_TABLET | Freq: Every day | ORAL | Status: DC
  Administered 2018-05-18: 12.5 mg via ORAL
  Filled 2018-05-18: qty 1
  Filled 2018-05-18 (×2): qty 0.5
  Filled 2018-05-18: qty 1

## 2018-05-18 MED ORDER — ONDANSETRON HCL 4 MG/2ML IJ SOLN: 4.0000 mg | Freq: Four times a day (QID) | INTRAMUSCULAR | Status: DC | PRN

## 2018-05-18 MED ORDER — MORPHINE SULFATE (PF) 2 MG/ML IV SOLN: 2.0000 mg | INTRAVENOUS | Status: DC | PRN

## 2018-05-18 MED ORDER — SACUBITRIL-VALSARTAN 49-51 MG PO TABS
1.0000 | ORAL_TABLET | Freq: Two times a day (BID) | ORAL | Status: DC
  Administered 2018-05-18 – 2018-05-19 (×2): 1 via ORAL
  Filled 2018-05-18 (×3): qty 1

## 2018-05-18 MED ORDER — INSULIN ASPART 100 UNIT/ML ~~LOC~~ SOLN
0.0000 [IU] | Freq: Three times a day (TID) | SUBCUTANEOUS | Status: DC
  Administered 2018-05-18: 2 [IU] via SUBCUTANEOUS

## 2018-05-18 MED ORDER — HYDRALAZINE HCL 20 MG/ML IJ SOLN
20.0000 mg | Freq: Once | INTRAMUSCULAR | Status: AC
  Administered 2018-05-18: 20 mg via INTRAVENOUS
  Filled 2018-05-18 (×2): qty 1

## 2018-05-18 MED ORDER — ASPIRIN 81 MG PO CHEW
324.0000 mg | CHEWABLE_TABLET | Freq: Once | ORAL | Status: AC
  Administered 2018-05-18: 324 mg via ORAL
  Filled 2018-05-18: qty 4

## 2018-05-18 MED ORDER — HEPARIN (PORCINE) 25000 UT/250ML-% IV SOLN: 1400.0000 [IU]/h | INTRAVENOUS | Status: DC

## 2018-05-18 MED ORDER — HEPARIN BOLUS VIA INFUSION
3000.0000 [IU] | Freq: Once | INTRAVENOUS | Status: AC
  Administered 2018-05-18: 3000 [IU] via INTRAVENOUS
  Filled 2018-05-18: qty 3000

## 2018-05-18 NOTE — Progress Notes (Addendum)
ANTICOAGULATION CONSULT NOTE - Initial Consult  Pharmacy Consult for heparin drip Indication: chest pain/ACS  Allergies  Allergen Reactions  . Tomato Rash    Patient Measurements: Height: 6' (182.9 cm) Weight: 298 lb (135.2 kg) IBW/kg (Calculated) : 77.6 Heparin Dosing Weight: 108 kg  Vital Signs: Temp: 98.4 F (36.9 C) (11/12 2045) Temp Source: Oral (11/12 2045) BP: 145/93 (11/12 2045) Pulse Rate: 76 (11/12 2045)  Labs: Recent Labs    05/18/18 0545 05/18/18 0751  05/18/18 1015 05/18/18 1545 05/18/18 2152  HGB 14.3  --   --   --   --   --   HCT 42.2  --   --   --   --   --   PLT 227  --   --   --   --   --   APTT  --  33  --   --   --  52*  LABPROT  --  14.9  --   --   --   --   INR  --  1.18  --   --   --   --   HEPARINUNFRC  --  1.37*  --   --   --   --   CREATININE 1.56*  --   --   --   --   --   TROPONINI 0.05*  --    < > 0.05* 0.05* 0.06*   < > = values in this interval not displayed.    Estimated Creatinine Clearance: 81.5 mL/min (A) (by C-G formula based on SCr of 1.56 mg/dL (H)).   Medical History: Past Medical History:  Diagnosis Date  . CHF (congestive heart failure) (HCC)   . Chronic systolic heart failure (HCC)    a. 10/2012 Echo: EF <20%, mod to sev dil LV, diast dysfxn, mildly dil LA/RA, mod to severe MR/TR.  [name] Coronary artery disease, non-occlusive    a. 09/2014 Cath: minor luminal irregularities with severely dilated left ventricle. LVEDP 30.  [name] H/O noncompliance with medical treatment, presenting hazards to health   . History of traumatic injury of head   . Hyperlipidemia   . Hypertension   . Morbid obesity (HCC)   . NICM (nonischemic cardiomyopathy) (HCC)    a. 10/2012 Echo: EF <20%.  . Polysubstance abuse (HCC)    a. cocaine, tobacco, THC, ETOH  . Seizure disorder (HCC)   . Stroke [facility])     Medications:  On Eliquis as outpatient  Assessment:  Goal of Therapy:  Heparin level 0.3-0.7 units/ml  APTT 66-102 Monitor platelets by  anticoagulation protocol: Yes   Plan:  No bolus per consult order. 1400 units per hour initial rate. If baseline heparin level elevated, will follow and adjust by aPTT until correlation with heparin level. First aPTT 6 hours after start of infusion.  11/12 PM aPTT 52. Increase to 1500 units/hr. Recheck aPTT, HL, and CBC with tomorrow AM labs.  11/13 AM aPTT 62, heparin level 0.62. Increase rate to 1600 units/hr and recheck aPTT in 6 hours.  Mahari Strahm S 05/18/2018,10:28 PM

## 2018-05-18 NOTE — Progress Notes (Signed)
Hydralazine 20mg  IV given x 1 for BP 136/102.  Will monitor.

## 2018-05-18 NOTE — Progress Notes (Signed)
*  PRELIMINARY RESULTS* Echocardiogram 2D Echocardiogram has been performed.  Cristela Blue 05/18/2018, 1:32 PM

## 2018-05-18 NOTE — Progress Notes (Signed)
47 yo bm admitted to room 231 via stretcher from ED with chest pain.  No distress on ra, denies chest pain at this time.  Cardiac monitor placed on pt and verified with Keda, CNA.  SL lt hand/ac flushes well.  Oriented to room and surroundings, POC reviewed with pt.  Skin intact.  Remains NPO until cardiology sees, pt verbalizes understanding.  CB in reach, SR up x 2.

## 2018-05-18 NOTE — ED Notes (Signed)
Heparin drip not yet arrived to floor yet.

## 2018-05-18 NOTE — ED Triage Notes (Signed)
Patient ambulatory to triage with steady gait, without difficulty or distress noted; pt reports last few days having intermittent left sided CP nonradiating accomp by some dizziness; has pacemaker/defib

## 2018-05-18 NOTE — Consult Note (Addendum)
Cardiology Consultation:   Patient ID: Kent Stanley. MRN: [id number]; DOB: 09/19/1970  Admit date: 05/18/2018 Date of Consult: 05/18/2018  Primary Care Provider: Olin Hauser, DO Primary Cardiologist: Iverson Alamin, Dr. Saunders Revel Primary Electrophysiologist:  Dr. [name]   Patient Profile:   Kent Herrod. is a 47 y.o. male with a hx of non-obstructive CAD s/p 09/2014 Cath HFrEF (EF 20-25% 6/19) and ICD implanted 5/18 for primary prevention, NICM, paroxysmal Afib on Eliquis, h/o prior strokes, h/o NSVT, HTN, HLD, DM2, morbid obesity, OSA on CPAP, self reported as recently quit smoking 12/2017, and remote h/o polysubstance abuse who is being seen today for the evaluation of angina at the request of Dr. Jodell Cipro.  History of Present Illness:   Kent Stanley is a 47 yo male with PMH as above and followed by Dr. Fletcher [name] and Dr. [name] of [place].   Per records:  --09/2014 Cath showed minor luminal irregularities with severely dilated LV and LVEDP 30.  --11/2016: Medronic ICD implantation by Dr. Bosie Helper. Last check 04/2018. --6/19: He was hospitalized with recurrent stroke noted in the cerebellum with device interrogation noting Atrial Fibrillation and Eliquis initiated while Plavix discontinued. Echo 6/19 showed improved EF 20-25% with mild LAE, severe LV enlargement with concentric hypertrophy. Diffuse hypokinesis and consistent with G1DD with LV filling pressure elevate, moderate MR, mild RV enlargement and pacer wire noted, increased PASP, and recommendation for further definity contrast study to r/o apical thrombus.   05/16/2018: Patient reportedly experienced his first episode of left sided (around 5th ICS) non-pleuritic, non-radiating, constant, and severe (rated 8/10) chest pain Sunday 11/10 at rest and lasted from 12PM-1AM the next AM.  He did report that this CP was similar to his 2016 chest pain, which resulted in his catheterization. He described it as squeezing and constant  pressure. Associated sx included SOB and lightheadedness, as well as palpitations. He denied alleviating or aggravating factors and reported sudden alleviation of CP for unknown reason at 1AM and on 11/11. He reportedly went to work 11/11 and was chest pain free until that same night and around 2-3am, He stated that at that time he woke up and realized he again had severe chest pain. He called his girlfriend, who is a Marine scientist and recommended that he present to the ED. He reported his CP stopped while in the ED.   Of note, patient reported long h/o angina and stated his angina is usually worse with rest and made better with exertion. He reported this was not the case with his most recent episode given the chest pain was so severe that he did not attempt to exert himself during the pain. Also of note, he reportedly quit smoking after his 12/2017 stroke and occasionally drinks 3-4 beers. He denied current or recent abuse of drugs with a remote h/o cocaine, tobacco, TCH, and ETOH abuse. He reported recent weight loss on new DM2 medications. He reported CPAP compliance and understanding that use of CPAP will help with Afib and cardiac health.   In the ED Vitals: BP 142/89, HR 84, RR 16, T 97.5, SpO2 94% Labs: Na 141, Cr 1.56 (baseline ~1.3-1.4), Ca 8.7, K 3.8, WBC 9.4, Hgb 14.3, Hct 42.2, plt 227, alk phos 34, Mg 1.9,  Troponin mildly elevated, flat trending 0.05  0.06  0.05 EKG: No STE, SR CXR: No acute changes, mild cardiomegaly Meds: Started on heparin  No current CP.  Past Medical History:  Diagnosis Date  . CHF (congestive heart failure) ([place])   .  Chronic systolic heart failure ([place])    a. 10/2012 Echo: EF <20%, mod to sev dil LV, diast dysfxn, mildly dil LA/RA, mod to severe MR/TR.  [name] Coronary artery disease, non-occlusive    a. 09/2014 Cath: minor luminal irregularities with severely dilated left ventricle. LVEDP 30.  [name] H/O noncompliance with medical treatment, presenting hazards to health   .  History of traumatic injury of head   . Hyperlipidemia   . Hypertension   . Morbid obesity ([place])   . NICM (nonischemic cardiomyopathy) ([place])    a. 10/2012 Echo: EF <20%.  . Polysubstance abuse ([place])    a. cocaine, tobacco, THC, ETOH  . Seizure disorder ([place])   . Stroke [facility])     Past Surgical History:  Procedure Laterality Date  . CARDIAC CATHETERIZATION     [facility]  . ICD IMPLANT N/A 12/08/2016   Procedure: ICD Implant;  Surgeon: Deboraha Sprang, MD;  Location: [place] [facility];  Service: Cardiovascular;  Laterality: N/A;     Home Medications:  Prior to Admission medications   Medication Sig Start Date End Date Taking? Authorizing Provider  apixaban (ELIQUIS) 5 MG TABS tablet Take 1 tablet (5 mg total) by mouth 2 (two) times daily. 01/04/18  Yes Rosalin Hawking, MD  ENTRESTO 49-51 MG take 1 tablet by mouth twice a day 11/25/16  Yes [name], FNP  furosemide (LASIX) 40 MG tablet Take 48m in morning. Take half tab for dose 20 mg tablet every evening. 01/25/18  Yes HDarylene PriceA, FNP  metoprolol succinate (TOPROL-XL) 25 MG 24 hr tablet Take 1 tablet (25 mg total) by mouth daily. 03/11/18  Yes Karamalegos, ADevonne Doughty DO  pravastatin (PRAVACHOL) 40 MG tablet TAKE 2 TABLETS BY MOUTH ONCE DAILY 03/11/18  Yes Karamalegos, ADevonne Doughty DO  Semaglutide,0.25 or 0.5MG/DOS, (OZEMPIC, 0.25 OR 0.5 MG/DOSE,) 2 MG/1.5ML SOPN Inject 0.5 mg into the skin once a week. For first 4 weeks, inject dose 0.221mweekly 04/13/18  Yes Karamalegos, AlDevonne DoughtyDO  spironolactone (ALDACTONE) 25 MG tablet Take 0.5 tablets (12.5 mg total) by mouth daily. 04/23/17  Yes Karamalegos, AlDevonne DoughtyDO  ACCU-CHEK AVIVA PLUS test strip Check blood sugar up to 1 x daily 01/18/18   Ka[place] RangerAlDevonne DoughtyDO  ACCU-CHEK SOFTCLIX LANCETS lancets Check blood sugar up to 1 x daily 01/18/18   KaOlin HauserDO  blood glucose meter kit and supplies KIT Dispense based on patient and insurance preference. Check blood glucose  once daily. 08/21/15   KrLuciana AxeNP  Blood Glucose Monitoring Suppl (ACCU-CHEK AVIVA PLUS) w/Device KIT Check blood sugar up to 1 x daily 01/18/18   Karamalegos, AlDevonne DoughtyDO  cyclobenzaprine (FLEXERIL) 10 MG tablet Take 0.5-1 tablets (5-10 mg total) by mouth 3 (three) times daily as needed for muscle spasms. Patient not taking: Reported on 05/18/2018 04/13/18   KaOlin HauserDO  gabapentin (NEURONTIN) 100 MG capsule Start 1 capsule daily, increase by 1 cap every 2-3 days as tolerated up to 3 times a day, or may take 3 at once in evening. Patient not taking: Reported on 05/18/2018 04/13/18   KaOlin HauserDO  predniSONE (DELTASONE) 10 MG tablet Take 6 tabs with breakfast Day 1, 5 tabs Day 2, 4 tabs Day 3, 3 tabs Day 4, 2 tabs Day 5, 1 tab Day 6. Patient not taking: Reported on 05/18/2018 04/13/18   KaOlin HauserDO  sildenafil (REVATIO) 20 MG tablet Take 5 tablets (100 mg  total) by mouth as needed. Take about 30 min prior to sex. Caution stop taking if chest pain. Do not take with Nitro. 11/23/17   Olin Hauser, DO    Inpatient Medications: Scheduled Meds: . aspirin  81 mg Oral Daily  . [START ON 05/19/2018] furosemide  40 mg Oral Daily  . insulin aspart  0-15 Units Subcutaneous TID WC  . insulin aspart  0-5 Units Subcutaneous QHS  . [START ON 05/19/2018] metoprolol succinate  25 mg Oral Daily  . pravastatin  80 mg Oral Daily  . sacubitril-valsartan  1 tablet Oral BID  . spironolactone  12.5 mg Oral Daily   Continuous Infusions: . heparin     PRN Meds: acetaminophen, bisacodyl, morphine injection, nitroGLYCERIN, ondansetron (ZOFRAN) IV, senna-docusate  Allergies:    Allergies  Allergen Reactions  . Tomato Rash    Social History:   Social History   Socioeconomic History  . Marital status: Single    Spouse name: Not on file  . Number of children: Not on file  . Years of education: Not on file  . Highest education level: Not  on file  Occupational History  . Not on file  Social Needs  . Financial resource strain: Not on file  . Food insecurity:    Worry: Not on file    Inability: Not on file  . Transportation needs:    Medical: Not on file    Non-medical: Not on file  Tobacco Use  . Smoking status: Former Smoker    Types: Cigars    Last attempt to quit: 01/02/2018    Years since quitting: 0.3  . Smokeless tobacco: Former Systems developer  . Tobacco comment: smokes ~10 black and milds per week  Substance and Sexual Activity  . Alcohol use: Yes    Comment: 8-9 beers a month per pt  . Drug use: No  . Sexual activity: Not Currently  Lifestyle  . Physical activity:    Days per week: Not on file    Minutes per session: Not on file  . Stress: Not on file  Relationships  . Social connections:    Talks on phone: Not on file    Gets together: Not on file    Attends religious service: Not on file    Active member of club or organization: Not on file    Attends meetings of clubs or organizations: Not on file    Relationship status: Not on file  . Intimate partner violence:    Fear of current or ex partner: Not on file    Emotionally abused: Not on file    Physically abused: Not on file    Forced sexual activity: Not on file  Other Topics Concern  . Not on file  Social History Narrative  . Not on file    Family History:    Family History  Problem Relation Age of Onset  . Diabetes Mother   . Brain cancer Mother   . Heart attack Mother   . Sleep apnea Mother   . Heart failure Mother   . Hypertension Father   . Diabetes Father   . Heart disease Father   . Hyperlipidemia Father   . Heart attack Father   . Heart failure Father   . Sleep apnea Father   . Sleep apnea Sister   . Sleep apnea Brother   . Heart failure Maternal Aunt      ROS:  Please see the history of present illness.   All  other ROS reviewed and negative.     Physical Exam/Data:   Vitals:   05/18/18 0528 05/18/18 0529 05/18/18 0751  05/18/18 0833  BP:  (!) 142/89 121/80 127/85  Pulse:  84 73 75  Resp:  16 (!) 22   Temp:  (!) 97.5 F (36.4 C)    TempSrc:  Oral    SpO2:  94% 95% 97%  Weight: 131.5 kg   135.2 kg  Height: 6' (1.829 m)   6' (1.829 m)   No intake or output data in the 24 hours ending 05/18/18 0906 Filed Weights   05/18/18 0528 05/18/18 0833  Weight: 131.5 kg 135.2 kg   Body mass index is 40.42 kg/m.  General:  Well nourished, well developed, in no acute distress HEENT: normal Lymph: no adenopathy Neck: positive HJR, JVP difficult to assess d/t body habitus  Endocrine:  No thryomegaly Vascular: No carotid bruits; FA pulses 2+ bilaterally without bruits  Cardiac:  normal S1, S2; RRR; no murmur  Lungs:  clear to auscultation bilaterally, no wheezing, rhonchi or rales  Abd: soft, nontender, no hepatomegaly  Ext: no bilateral LE edema Musculoskeletal:  No deformities, BUE and BLE strength normal and equal Skin: warm and dry  Neuro:  no focal abnormalities noted Psych:  Normal affect   EKG:  The EKG was personally reviewed and demonstrates:  SR as in HPI Telemetry:  Telemetry was personally reviewed and demonstrates:  SR  Relevant CV Studies: Pending updated echo  01/02/2018 TTE Study Conclusions - Left ventricle: The cavity size was severely dilated. There was   severe concentric hypertrophy. LV apical false tendon. Systolic   function was severely reduced. The estimated ejection fraction   was in the range of 20% to 25%. Diffuse hypokinesis. Doppler   parameters are consistent with abnormal left ventricular   relaxation (grade 1 diastolic dysfunction). LV filling pressure   is elevated. - Mitral valve: Mildly thickened leaflets . There was moderate   regurgitation. - Left atrium: The atrium was normal in size. - Right ventricle: The cavity size was mildly dilated. Pacer wire   or catheter noted in right ventricle. Systolic function was   normal. - Right atrium: The atrium was normal  in size. Pacer wire or   catheter noted in right atrium. - Tricuspid valve: There was trivial regurgitation. - Pulmonary arteries: Dilated. PA peak pressure: 22 mm Hg (S). - Inferior vena cava: The vessel was normal in size. The   respirophasic diameter changes were in the normal range (= 50%),   consistent with normal central venous pressure. Impressions: - Compared to a prior study in 2018, the LVEF may be slightly   higher at 20-25%. No definite apical thrombus, however, in the   setting of stroke, recommend limited Definity contrast study to   r/o apical thrombus.   *09/08/2014 Cardiac cath Scan attached under proceudres tab  Laboratory Data:  Chemistry Recent Labs  Lab 05/18/18 0545  NA 141  K 3.8  CL 106  CO2 25  GLUCOSE 104*  BUN 25*  CREATININE 1.56*  CALCIUM 8.7*  GFRNONAA 51*  GFRAA 59*  ANIONGAP 10    Recent Labs  Lab 05/18/18 0545  PROT 7.5  ALBUMIN 3.9  AST 25  ALT 23  ALKPHOS 34*  BILITOT 1.0   Hematology Recent Labs  Lab 05/18/18 0545  WBC 9.4  RBC 4.42  HGB 14.3  HCT 42.2  MCV 95.5  MCH 32.4  MCHC 33.9  RDW 12.3  PLT  227   Cardiac Enzymes Recent Labs  Lab 05/18/18 0545 05/18/18 0828  TROPONINI 0.05* 0.06*   No results for input(s): TROPIPOC in the last 168 hours.  BNPNo results for input(s): BNP, PROBNP in the last 168 hours.  DDimer No results for input(s): DDIMER in the last 168 hours.  Radiology/Studies:  Dg Chest Portable 1 View  Result Date: 05/18/2018 CLINICAL DATA:  Acute onset of generalized chest pain. EXAM: PORTABLE CHEST 1 VIEW COMPARISON:  Chest radiograph performed 12/09/2016 FINDINGS: The lungs are well-aerated and clear. There is no evidence of focal opacification, pleural effusion or pneumothorax. The cardiomediastinal silhouette is mildly enlarged. An AICD is noted overlying the left chest wall, with a single lead ending overlying the right ventricle. No acute osseous abnormalities are seen. IMPRESSION: No acute  cardiopulmonary process seen; mild cardiomegaly noted. Electronically Signed   By: Garald Balding M.D.   On: 05/18/2018 05:51    Assessment and Plan:   1. Atypical Angina with h/o nonobstructive CAD  - No current CP. Atypical in described as worse with rest and better with exertion - Mild troponin elevation as above and trending down. Continue to cycle. Previous 2016 cardiac cath as above shows non-obstructive CAD - Discontinue Eliquis as patient will need 48h washout in the event cardiac enzymes bump. If not, can restart Eliquis - will reassess tomorrow.  - Start heart healthy carb modified diet as no plan for cath today - Will interrogate device today given patient's report of palpitations on exam - Pending updated echo with last echo copied above in CV studies and showing EF 20-25% - Cannot completely r/o cardiac etiology given patient history and risk factors. Continue to cycle troponin. Consider also troponin elevation in the setting of reduced kidney function. Plan to interrogate ICD given patient report of palpitations as a paroxysmal arrhythmia /tachyarrhythmia might contribute to elevated troponin 2/2 supply demand ischemia. Continue heparin and medical management with asa, toprol, spironolactone (monitor K, renal function), SL nitro and morphine as needed for pain, pravastatin, and Entresto. Discontinued Eliquis in the event that troponin bumps and needs cardiac catheterization.   2. Paroxysmal Atrial fibrillation and h/o NSVT and stroke - CHA2DS2VASc score of at least 5 (CHF, HTN, DM, stroke x2) - On Eliquis with hold for 48h washout in the event that cath needed d/t troponin bump. Continue heparin with daily CBC. Continue toprol. Will need restart of Eliquis prior to discharge. - Pending ICD interrogation. Consider paroxysmal Afib as possible etiology of troponin elevation and chest pain as rapid rate could contribute to supply demand    3. HFrEF (EF 20-25% 12/2017) with NICM and ICD  Medronic for primary prevention  - ICD Medtronic for primary prevention - Interrogation and echo pending - Medical management as above - Continue home lasix 63m po qd. Recommend restart of Entresto with continued monitoring of renal function. BMET tomorrow AM   4. HTN - Monitor  5. HLD - Continue statin therapy  6. DM2 - SSI - Per IM  7. OSA - Reports CPAP complaince  8. CKDIII - BMET daily.  - Entresto held. Recommend restart with continued monitoring of renal function. - Monitor renal function and electrolytes - Per IM  9. H/o polysubstance abuse - Quit smoking      For questions or updates, please contact [place] ColonyPlease consult [email or web address] for contact info under     Signed, JArvil Chaco PA-C  05/18/2018 9:06 AM

## 2018-05-18 NOTE — Progress Notes (Addendum)
ANTICOAGULATION CONSULT NOTE - Initial Consult  Pharmacy Consult for heparin drip Indication: chest pain/ACS  Allergies  Allergen Reactions  . Tomato Rash    Patient Measurements: Height: 6' (182.9 cm) Weight: 298 lb (135.2 kg) IBW/kg (Calculated) : 77.6 Heparin Dosing Weight: 108.5 kg  Vital Signs: Temp: 97.5 F (36.4 C) (11/12 0529) Temp Source: Oral (11/12 0529) BP: 127/85 (11/12 0833) Pulse Rate: 75 (11/12 0833)  Labs: Recent Labs    05/18/18 0545 05/18/18 0751 05/18/18 0828  HGB 14.3  --   --   HCT 42.2  --   --   PLT 227  --   --   APTT  --  33  --   LABPROT  --  14.9  --   INR  --  1.18  --   HEPARINUNFRC  --  1.37*  --   CREATININE 1.56*  --   --   TROPONINI 0.05*  --  0.06*    Estimated Creatinine Clearance: 81.5 mL/min (A) (by C-G formula based on SCr of 1.56 mg/dL (H)).   Medical History: Past Medical History:  Diagnosis Date  . CHF (congestive heart failure) ([facility])   . Chronic systolic heart failure ([facility])    a. 10/2012 Echo: EF <20%, mod to sev dil LV, diast dysfxn, mildly dil LA/RA, mod to severe MR/TR.  [name] Coronary artery disease, non-occlusive    a. 09/2014 Cath: minor luminal irregularities with severely dilated left ventricle. LVEDP 30.  [name] H/O noncompliance with medical treatment, presenting hazards to health   . History of traumatic injury of head   . Hyperlipidemia   . Hypertension   . Morbid obesity ([facility])   . NICM (nonischemic cardiomyopathy) ([facility])    a. 10/2012 Echo: EF <20%.  . Polysubstance abuse ([facility])    a. cocaine, tobacco, THC, ETOH  . Seizure disorder ([facility])   . Stroke [facility])     Assessment: 47 year old male presented with chest pain. Troponin on admission 0.05, concern for NSTEMI vs unstable angina. Patient with h/o afib and prior CVA on Eliquis 5 mg BID PTA. Patient reports last dose of Eliquis at 0400 11/12 am. Pharmacy has been consulted for heparin drip.  Goal of Therapy:  Heparin level 0.3-0.7 units/ml  APTT  66-102 Monitor platelets by anticoagulation protocol: Yes   Plan:  Will start heparin today at 1600 which is approximately 12 hours after last dose of Eliquis.  Heparin 3000 unit bolus x 1 to be given at 1600, followed by heparin drip at 1400 units/hr. Will order APTT for 2200. CBC and heparin level ordered with morning labs.  Pricilla Riffle, PharmD Pharmacy Resident  05/18/2018 9:52 AM

## 2018-05-18 NOTE — Plan of Care (Signed)
  Problem: Clinical Measurements: Goal: Will remain free from infection Outcome: Progressing Note:  Remains afebrile Goal: Cardiovascular complication will be avoided Outcome: Progressing Note:  No arrhythmias this shift   Problem: Cardiac: Goal: Ability to achieve and maintain adequate cardiovascular perfusion will improve Outcome: Progressing Note:  Pt scheduled for 2Decho, ICD interrogated.   Problem: Clinical Measurements: Goal: Diagnostic test results will improve Outcome: Not Progressing Note:  Troponins .05,0.06,.05 BUN 25/1.56

## 2018-05-18 NOTE — Care Management Note (Addendum)
Case Management Note  Patient Details  Name: Kent Stanley. MRN: [id number] Date of Birth: [birth year]  Subjective/Objective:     From home alone.  Address [place] in [place] but travels to [place] for several days at a time to visit girlfriend.   Admitted with angina.  History of  CAD, Stage III kidney disease; polysub abuse. CPAP at home. Chronic eliquis/entresto; eliquis on hold.  EF 20-25%.  echo performed today.  Independent in all adls, denies issues accessing medical care, obtaining medications or with transportation.  Current with PCP.  No discharge needs identified at present by care manager or members of care team Has a CPAP machine at home.                 Action/Plan:   Expected Discharge Date:                  Expected Discharge Plan:  Home/Self Care  In-House Referral:     Discharge planning Services  CM Consult  Post Acute Care Choice:    Choice offered to:     DME Arranged:    DME Agency:     HH Arranged:    HH Agency:     Status of Service:  In process, will continue to follow  If discussed at Long Length of Stay Meetings, dates discussed:    Additional Comments:  Sherren Kerns, RN 05/18/2018, 4:05 PM

## 2018-05-18 NOTE — ED Provider Notes (Signed)
[facility] Emergency Department Provider Note  ____________________________________________   First MD Initiated Contact with Patient 05/18/18 [phone]     (approximate)  I have reviewed the triage vital signs and the nursing notes.   HISTORY  Chief Complaint Chest Pain    HPI Kent Stanley. is a 47 y.o. male with medical history notable for prior heart attack, some CHF, ICD, prior CVAs on Eliquis, morbid obesity, significantly decreased EF due to nonischemic cardiomyopathy, and history of polysubstance abuse.  He presents today for evaluation of several days of worsening frequency of chest pain.  He states that he is used to angina but this is been quite a bit worse and particularly over the last few hours.  He states that it feels like a tight pressure in the left upper part of his chest.  Normally he has angina with movement and exertion but it is happening at rest and it was going on all through the night so that he could not get any sleep.  His girlfriend finally made him come to the emergency department for evaluation.  He states the pain went away briefly upon arrival but that it is come back and he can still feel it in the left side of his chest.  Nothing in particular makes it better or worse.  He denies fever/chills, nausea, vomiting, shortness of breath, and abdominal pain.  Past Medical History:  Diagnosis Date  . CHF (congestive heart failure) ([place])   . Chronic systolic heart failure ([place])    a. 10/2012 Echo: EF <20%, mod to sev dil LV, diast dysfxn, mildly dil LA/RA, mod to severe MR/TR.  [name] Coronary artery disease, non-occlusive    a. 09/2014 Cath: minor luminal irregularities with severely dilated left ventricle. LVEDP 30.  [name] H/O noncompliance with medical treatment, presenting hazards to health   . History of traumatic injury of head   . Hyperlipidemia   . Hypertension   . Morbid obesity ([place])   . NICM (nonischemic cardiomyopathy) ([place])    a. 10/2012 Echo: EF <20%.  . Polysubstance abuse ([place])    a. cocaine, tobacco, THC, ETOH  . Seizure disorder ([place])   . Stroke [facility])     Patient Active Problem List   Diagnosis Date Noted  . Unstable angina ([place]) 05/18/2018  . Atrial fibrillation ([place]) 01/04/2018  . Tobacco use disorder 01/04/2018  . History of stroke involving cerebellum 01/01/2018  . Vertigo due to previous cerebellar infarction 06/10/2016  . CKD (chronic kidney disease), stage II 05/15/2016  . Type 2 diabetes mellitus with nephropathy ([place]) 04/18/2016  . Morbid obesity ([place])   . Seizure disorder ([place])   . NICM (nonischemic cardiomyopathy) ([place])   . Musculoskeletal pain 10/31/2014  . Erectile dysfunction 10/31/2014  . OSA (obstructive sleep apnea) 11/05/2013  . Chronic systolic heart failure ([place])   . Hyperlipidemia   . Hypertension   . History of CVA (cerebrovascular accident) 01/27/2013    Past Surgical History:  Procedure Laterality Date  . CARDIAC CATHETERIZATION     [facility]  . ICD IMPLANT N/A 12/08/2016   Procedure: ICD Implant;  Surgeon: Deboraha Sprang, MD;  Location: [facility];  Service: Cardiovascular;  Laterality: N/A;    Prior to Admission medications   Medication Sig Start Date End Date Taking? Authorizing Provider  apixaban (ELIQUIS) 5 MG TABS tablet Take 1 tablet (5 mg total) by mouth 2 (two) times daily. 01/04/18  Yes Rosalin Hawking, MD  ENTRESTO 49-51 MG  take 1 tablet by mouth twice a day 11/25/16  Yes Hackney, Otila Kluver A, FNP  furosemide (LASIX) 40 MG tablet Take 61m in morning. Take half tab for dose 20 mg tablet every evening. 01/25/18  Yes HDarylene PriceA, FNP  metoprolol succinate (TOPROL-XL) 25 MG 24 hr tablet Take 1 tablet (25 mg total) by mouth daily. 03/11/18  Yes Karamalegos, ADevonne Doughty DO  pravastatin (PRAVACHOL) 40 MG tablet TAKE 2 TABLETS BY MOUTH ONCE DAILY 03/11/18  Yes Karamalegos, ADevonne Doughty DO  Semaglutide,0.25 or 0.5MG/DOS, (OZEMPIC, 0.25 OR 0.5 MG/DOSE,) 2 MG/1.5ML SOPN Inject  0.5 mg into the skin once a week. For first 4 weeks, inject dose 0.265mweekly 04/13/18  Yes Karamalegos, AlDevonne DoughtyDO  spironolactone (ALDACTONE) 25 MG tablet Take 0.5 tablets (12.5 mg total) by mouth daily. 04/23/17  Yes Karamalegos, AlDevonne DoughtyDO  ACCU-CHEK AVIVA PLUS test strip Check blood sugar up to 1 x daily 01/18/18   KaPar[place] RangerAlDevonne DoughtyDO  ACCU-CHEK SOFTCLIX LANCETS lancets Check blood sugar up to 1 x daily 01/18/18   KaOlin HauserDO  blood glucose meter kit and supplies KIT Dispense based on patient and insurance preference. Check blood glucose once daily. 08/21/15   KrLuciana AxeNP  Blood Glucose Monitoring Suppl (ACCU-CHEK AVIVA PLUS) w/Device KIT Check blood sugar up to 1 x daily 01/18/18   Karamalegos, AlDevonne DoughtyDO  cyclobenzaprine (FLEXERIL) 10 MG tablet Take 0.5-1 tablets (5-10 mg total) by mouth 3 (three) times daily as needed for muscle spasms. Patient not taking: Reported on 05/18/2018 04/13/18   KaOlin HauserDO  gabapentin (NEURONTIN) 100 MG capsule Start 1 capsule daily, increase by 1 cap every 2-3 days as tolerated up to 3 times a day, or may take 3 at once in evening. Patient not taking: Reported on 05/18/2018 04/13/18   KaOlin HauserDO  predniSONE (DELTASONE) 10 MG tablet Take 6 tabs with breakfast Day 1, 5 tabs Day 2, 4 tabs Day 3, 3 tabs Day 4, 2 tabs Day 5, 1 tab Day 6. Patient not taking: Reported on 05/18/2018 04/13/18   KaOlin HauserDO  sildenafil (REVATIO) 20 MG tablet Take 5 tablets (100 mg total) by mouth as needed. Take about 30 min prior to sex. Caution stop taking if chest pain. Do not take with Nitro. 11/23/17   Karamalegos, AlDevonne DoughtyDO    Allergies Tomato  Family History  Problem Relation Age of Onset  . Diabetes Mother   . Brain cancer Mother   . Heart attack Mother   . Sleep apnea Mother   . Heart failure Mother   . Hypertension Father   . Diabetes Father   . Heart disease Father     . Hyperlipidemia Father   . Heart attack Father   . Heart failure Father   . Sleep apnea Father   . Sleep apnea Sister   . Sleep apnea Brother   . Heart failure Maternal Aunt     Social History Social History   Tobacco Use  . Smoking status: Former Smoker    Types: Cigars    Last attempt to quit: 01/02/2018    Years since quitting: 0.3  . Smokeless tobacco: Former [employer] developer. Tobacco comment: smokes ~10 black and milds per week  Substance Use Topics  . Alcohol use: Yes    Comment: 8-9 beers a month per pt  . Drug use: No    Review of Systems Constitutional: No fever/chills Eyes: No  visual changes. ENT: No sore throat. Cardiovascular: Chest pain and pressure as described above Respiratory: Denies shortness of breath. Gastrointestinal: No abdominal pain.  No nausea, no vomiting.  No diarrhea.  No constipation. Genitourinary: Negative for dysuria. Musculoskeletal: Negative for neck pain.  Negative for back pain. Integumentary: Negative for rash. Neurological: Negative for headaches, focal weakness or numbness.   ____________________________________________   PHYSICAL EXAM:  VITAL SIGNS: ED Triage Vitals  Enc Vitals Group     BP 05/18/18 0529 (!) 142/89     Pulse Rate 05/18/18 0529 84     Resp 05/18/18 0529 16     Temp 05/18/18 0529 (!) 97.5 F (36.4 C)     Temp Source 05/18/18 0529 Oral     SpO2 05/18/18 0529 94 %     Weight 05/18/18 0528 131.5 kg (290 lb)     Height 05/18/18 0528 1.829 m (6')     Head Circumference --      Peak Flow --      Pain Score 05/18/18 0528 6     Pain Loc --      Pain Edu? --      Excl. in [place]? --     Constitutional: Alert and oriented.  Generally well appearing and in no acute distress but does appear somewhat uncomfortable. Eyes: Conjunctivae are normal.  Head: Atraumatic. Nose: No congestion/rhinnorhea. Mouth/Throat: Mucous membranes are moist. Neck: No stridor.  No meningeal signs.   Cardiovascular: Normal rate, regular  rhythm. Good peripheral circulation. Grossly normal heart sounds.  Mild reproducible chest wall tenderness on the left side.  ICD. Respiratory: Normal respiratory effort.  No retractions. Lungs CTAB. Gastrointestinal: Soft and nontender. No distention.  Musculoskeletal: No lower extremity tenderness nor edema. No gross deformities of extremities. Neurologic:  Normal speech and language. No gross focal neurologic deficits are appreciated.  Skin:  Skin is warm, dry and intact. No rash noted. Psychiatric: Mood and affect are normal. Speech and behavior are normal.  ____________________________________________   LABS (all labs ordered are listed, but only abnormal results are displayed)  Labs Reviewed  COMPREHENSIVE METABOLIC PANEL - Abnormal; Notable for the following components:      Result Value   Glucose, Bld 104 (*)    BUN 25 (*)    Creatinine, Ser 1.56 (*)    Calcium 8.7 (*)    Alkaline Phosphatase 34 (*)    GFR calc non Af Amer 51 (*)    GFR calc Af Amer 59 (*)    All other components within normal limits  TROPONIN I - Abnormal; Notable for the following components:   Troponin I 0.05 (*)    All other components within normal limits  CBC WITH DIFFERENTIAL/PLATELET  MAGNESIUM  LIPASE, BLOOD  PROTIME-INR  APTT  HEPARIN LEVEL (UNFRACTIONATED)  TROPONIN I  TROPONIN I  PHOSPHORUS  CALCIUM, IONIZED   ____________________________________________  EKG  ED ECG REPORT I, Hinda Kehr, the attending physician, personally viewed and interpreted this ECG.  Date: 05/18/2018 EKG Time: 5:39 AM Rate: 80 Rhythm: normal sinus rhythm QRS Axis: normal Intervals: normal ST/T Wave abnormalities: normal Narrative Interpretation: no evidence of acute ischemia  ____________________________________________  RADIOLOGY I, Hinda Kehr, personally viewed and evaluated these images (plain radiographs) as part of my medical decision making, as well as reviewing the written report by the  radiologist.  ED MD interpretation: No acute intrathoracic process is visualized on chest x-ray  Official radiology report(s): Dg Chest Portable 1 View  Result Date: 05/18/2018 CLINICAL DATA:  Acute onset of generalized chest pain. EXAM: PORTABLE CHEST 1 VIEW COMPARISON:  Chest radiograph performed 12/09/2016 FINDINGS: The lungs are well-aerated and clear. There is no evidence of focal opacification, pleural effusion or pneumothorax. The cardiomediastinal silhouette is mildly enlarged. An AICD is noted overlying the left chest wall, with a single lead ending overlying the right ventricle. No acute osseous abnormalities are seen. IMPRESSION: No acute cardiopulmonary process seen; mild cardiomegaly noted. Electronically Signed   By: Garald Balding M.D.   On: 05/18/2018 05:51    ____________________________________________   PROCEDURES  Critical Care performed: Yes, see critical care procedure note(s)   Procedure(s) performed:   .Critical Care Performed by: Hinda Kehr, MD Authorized by: Hinda Kehr, MD   Critical care provider statement:    Critical care time (minutes):  30   Critical care time was exclusive of:  Separately billable procedures and treating other patients   Critical care was necessary to treat or prevent imminent or life-threatening deterioration of the following conditions: NSTEMI / unstable angina.   Critical care was time spent personally by me on the following activities:  Development of treatment plan with patient or surrogate, discussions with consultants, evaluation of patient's response to treatment, examination of patient, obtaining history from patient or surrogate, ordering and performing treatments and interventions, ordering and review of laboratory studies, ordering and review of radiographic studies, pulse oximetry, re-evaluation of patient's condition and review of old charts     ____________________________________________   INITIAL IMPRESSION /  [place] / ED COURSE  As part of my medical decision making, I reviewed the following data within the [place] notes reviewed and incorporated, Labs reviewed , EKG interpreted , Old chart reviewed, Radiograph reviewed , Discussed with admitting physician  and Notes from prior ED visits    Differential diagnosis includes, but is not limited to, ACS, unstable angina versus NSTEMI; musculoskeletal pain; pneumonia; pneumothorax; PE.  Patient is in very mild discomfort at this time but states the pain is been worse.  The symptoms he is describing sound consistent with unstable angina given that he is familiar with typical and predictable anginal chest pain but this is happening more frequently, is more severe, and it is happening at rest.  He does not use antiplatelet agents because he is on Eliquis but he would be appropriate for an aspirin but he does not want one at this time.  I will await the results of his troponin.  At a minimum he will need a repeat troponin but I think that the fact that he qualifies as unstable angina indicates he should stay in the hospital for further evaluation by cardiology.  His lab work is notable for a comprehensive metabolic panel indicating a creatinine of 1.56 which is his baseline, normal CBC, and a normal magnesium.  I will update him when his troponin is back.  Clinical Course as of May 18 801  Tue May 18, 2018  3846 Troponin 0 0.05 likely suggestive of NSTEMI, but at a minimum the patient is having unstable angina by definition given that he is having worsening anginal symptoms at rest and with greater frequency.  He is still feeling intermittent symptoms in his left chest since coming to the emergency department.  I have ordered aspirin 324 mg by mouth even though he is on Eliquis because it would be helpful if he is in fact having ACS.  I have also ordered heparin infusion but without bolus since he is on  Eliquis.  Pro time and  APTT have been added onto the lab work.  I discussed the case in person with the hospitalist who will admit.   [CF]    Clinical Course User Index [CF] Hinda Kehr, MD    ____________________________________________  FINAL CLINICAL IMPRESSION(S) / ED DIAGNOSES  Final diagnoses:  NSTEMI (non-ST elevated myocardial infarction) ([place])  On continuous oral anticoagulation     MEDICATIONS GIVEN DURING THIS VISIT:  Medications  aspirin chewable tablet 81 mg (has no administration in time range)  morphine 2 MG/ML injection 2-4 mg (has no administration in time range)  nitroGLYCERIN (NITROSTAT) SL tablet 0.4 mg (has no administration in time range)  heparin ADULT infusion 100 units/mL (25000 units/29m sodium chloride 0.45%) (has no administration in time range)  acetaminophen (TYLENOL) tablet 650 mg (has no administration in time range)  ondansetron (ZOFRAN) injection 4 mg (has no administration in time range)  insulin aspart (novoLOG) injection 0-15 Units (has no administration in time range)  insulin aspart (novoLOG) injection 0-5 Units (has no administration in time range)  senna-docusate (Senokot-S) tablet 1 tablet (has no administration in time range)  bisacodyl (DULCOLAX) EC tablet 5 mg (has no administration in time range)  sacubitril-valsartan (ENTRESTO) 49-51 mg per tablet (has no administration in time range)  furosemide (LASIX) tablet 40 mg (has no administration in time range)  metoprolol succinate (TOPROL-XL) 24 hr tablet 25 mg (has no administration in time range)  pravastatin (PRAVACHOL) tablet 80 mg (has no administration in time range)  spironolactone (ALDACTONE) tablet 12.5 mg (has no administration in time range)  aspirin chewable tablet 324 mg (324 mg Oral Given 05/18/18 0743)     ED Discharge Orders    None       Note:  This document was prepared using Dragon voice recognition software and may include unintentional dictation errors.    FHinda Kehr  MD 05/18/18 [phone]

## 2018-05-18 NOTE — Care Management Note (Signed)
Case Management Note  Patient Details  Name: Kent Stanley. MRN: [id number] Date of Birth: 09-05-1970  Subjective/Objective:                    Action/Plan:   Expected Discharge Date:                  Expected Discharge Plan:  Home/Self Care  In-House Referral:     Discharge planning Services  CM Consult  Post Acute Care Choice:    Choice offered to:     DME Arranged:    DME Agency:     HH Arranged:    HH Agency:     Status of Service:  In process, will continue to follow  If discussed at Long Length of Stay Meetings, dates discussed:    Additional Comments:  Sherren Kerns, RN 05/18/2018, 4:15 PM

## 2018-05-18 NOTE — H&P (Signed)
[facility] NAME: Kent Stanley    MR#:  [id number]  DATE OF BIRTH:  1971/05/15  DATE OF ADMISSION:  05/18/2018  PRIMARY CARE PHYSICIAN: Olin Hauser, DO   REQUESTING/REFERRING PHYSICIAN: Hinda Kehr, MD  CHIEF COMPLAINT:   Chief Complaint  Patient presents with  . Chest Pain    HISTORY OF PRESENT ILLNESS:  Kent Stanley  is a 47 y.o. male with a known history of obesity, T2NIDDM, HTN, HLD, minimal CAD (per 09/2014 cardiac cath), chronic systolic + diastolic CHF/NICM (EF 25-49% w/ grade I diastolic dysfxn as of 82/64/1583 Echo; s/p MedTronic [id number] Visia MRI AF AICD/PPM, 12/08/2016), CVA p/w CP. Pt endorses a long-standing Hx of angina. He tells me his Cardiologist is Dr. Fletcher [name]). He tells me he has an AICD/PPM. He states that he felt well throughout Saturday (11/09), but developed sudden-onset L-sided CP @~12 noon on Sunday 11/10 while he was at rest. He characterized the pain as squeezing/pressure sensation over the L breast. He states the pain was constant and severe immediately after onset, but became intermittent after a short time, and would come and go randomly. He states the pain stopped @~0100AM (Monday 11/11). He woke up at 0700AM (Mon 11/11) pain-free. He went to work, and felt well all throughout the day. @~2100PM that night (Mon 11/11), he states he was walking up the stairs, and became severely short of breath. He went to sleep. He was woken up out of his sleep @~0200AM (Tues 11/12) w/ severe CP, worse than he had experienced on Sunday. He called his fiance, who is an Therapist, sports. She instructed him to go to the ED. He drove himself. He says the pain stopped after arriving to the ED, and has not recurred since. He denies palpitations, diaphoresis, N/V, LH/LOC. He is comfortable, well-appearing and in no acute distress. He says he is hungry, but does not have any other complaints.  PAST MEDICAL HISTORY:   Past  Medical History:  Diagnosis Date  . CHF (congestive heart failure) ([place])   . Chronic systolic heart failure ([place])    a. 10/2012 Echo: EF <20%, mod to sev dil LV, diast dysfxn, mildly dil LA/RA, mod to severe MR/TR.  [name] Coronary artery disease, non-occlusive    a. 09/2014 Cath: minor luminal irregularities with severely dilated left ventricle. LVEDP 30.  [name] H/O noncompliance with medical treatment, presenting hazards to health   . History of traumatic injury of head   . Hyperlipidemia   . Hypertension   . Morbid obesity ([place])   . NICM (nonischemic cardiomyopathy) ([place])    a. 10/2012 Echo: EF <20%.  . Polysubstance abuse ([place])    a. cocaine, tobacco, THC, ETOH  . Seizure disorder ([place])   . Stroke [facility])     PAST SURGICAL HISTORY:   Past Surgical History:  Procedure Laterality Date  . CARDIAC CATHETERIZATION     [facility]  . ICD IMPLANT N/A 12/08/2016   Procedure: ICD Implant;  Surgeon: Deboraha Sprang, MD;  Location: [place] [facility];  Service: Cardiovascular;  Laterality: N/A;    SOCIAL HISTORY:   Social History   Tobacco Use  . Smoking status: Former Smoker    Types: Cigars    Last attempt to quit: 01/02/2018    Years since quitting: 0.3  . Smokeless tobacco: Former Systems developer  . Tobacco comment: smokes ~10 black and milds per week  Substance Use Topics  . Alcohol use: Yes  Comment: 8-9 beers a month per pt    FAMILY HISTORY:   Family History  Problem Relation Age of Onset  . Diabetes Mother   . Brain cancer Mother   . Heart attack Mother   . Sleep apnea Mother   . Heart failure Mother   . Hypertension Father   . Diabetes Father   . Heart disease Father   . Hyperlipidemia Father   . Heart attack Father   . Heart failure Father   . Sleep apnea Father   . Sleep apnea Sister   . Sleep apnea Brother   . Heart failure Maternal Aunt     DRUG ALLERGIES:   Allergies  Allergen Reactions  . Tomato Rash    REVIEW OF SYSTEMS:   Review of Systems  Constitutional:  Negative for chills, diaphoresis, fever, malaise/fatigue and weight loss.  HENT: Negative for congestion, ear pain, hearing loss, nosebleeds, sinus pain, sore throat and tinnitus.   Eyes: Negative for blurred vision, double vision and photophobia.  Respiratory: Positive for shortness of breath. Negative for cough, hemoptysis, sputum production and wheezing.   Cardiovascular: Positive for chest pain. Negative for palpitations, orthopnea, claudication, leg swelling and PND.  Gastrointestinal: Negative for abdominal pain, blood in stool, constipation, diarrhea, heartburn, nausea and vomiting.  Genitourinary: Negative for dysuria, frequency, hematuria and urgency.  Musculoskeletal: Negative for back pain, falls, joint pain, myalgias and neck pain.  Skin: Negative for itching and rash.  Neurological: Negative for dizziness, tingling, tremors, sensory change, speech change, focal weakness, seizures, loss of consciousness, weakness and headaches.  Psychiatric/Behavioral: Negative for memory loss. The patient does not have insomnia.    MEDICATIONS AT HOME:   Prior to Admission medications   Medication Sig Start Date End Date Taking? Authorizing Provider  apixaban (ELIQUIS) 5 MG TABS tablet Take 1 tablet (5 mg total) by mouth 2 (two) times daily. 01/04/18  Yes Rosalin Hawking, MD  ENTRESTO 49-51 MG take 1 tablet by mouth twice a day 11/25/16  Yes [name], FNP  furosemide (LASIX) 40 MG tablet Take '40mg'$  in morning. Take half tab for dose 20 mg tablet every evening. 01/25/18  Yes [name], FNP  metoprolol succinate (TOPROL-XL) 25 MG 24 hr tablet Take 1 tablet (25 mg total) by mouth daily. 03/11/18  Yes Karamalegos, [name], DO  pravastatin (PRAVACHOL) 40 MG tablet TAKE 2 TABLETS BY MOUTH ONCE DAILY 03/11/18  Yes Karamalegos, [name], DO  Semaglutide,0.25 or 0.'5MG'$ /DOS, (OZEMPIC, 0.25 OR 0.5 MG/DOSE,) 2 MG/1.5ML SOPN Inject 0.5 mg into the skin once a week. For first 4 weeks, inject dose 0.'25mg'$   weekly 04/13/18  Yes Karamalegos, [name], DO  spironolactone (ALDACTONE) 25 MG tablet Take 0.5 tablets (12.5 mg total) by mouth daily. 04/23/17  Yes Karamalegos, [name], DO  ACCU-CHEK AVIVA PLUS test strip Check blood sugar up to 1 x daily 01/18/18   Parks Ranger, [name], DO  ACCU-CHEK SOFTCLIX LANCETS lancets Check blood sugar up to 1 x daily 01/18/18   Olin Hauser, DO  blood glucose meter kit and supplies KIT Dispense based on patient and insurance preference. Check blood glucose once daily. 08/21/15   Luciana Axe, NP  Blood Glucose Monitoring Suppl (ACCU-CHEK AVIVA PLUS) w/Device KIT Check blood sugar up to 1 x daily 01/18/18   Karamalegos, [name], DO  cyclobenzaprine (FLEXERIL) 10 MG tablet Take 0.5-1 tablets (5-10 mg total) by mouth 3 (three) times daily as needed for muscle spasms. Patient not taking: Reported on  05/18/2018 04/13/18   Karamalegos, [name], DO  gabapentin (NEURONTIN) 100 MG capsule Start 1 capsule daily, increase by 1 cap every 2-3 days as tolerated up to 3 times a day, or may take 3 at once in evening. Patient not taking: Reported on 05/18/2018 04/13/18   Olin Hauser, DO  predniSONE (DELTASONE) 10 MG tablet Take 6 tabs with breakfast Day 1, 5 tabs Day 2, 4 tabs Day 3, 3 tabs Day 4, 2 tabs Day 5, 1 tab Day 6. Patient not taking: Reported on 05/18/2018 04/13/18   Olin Hauser, DO  sildenafil (REVATIO) 20 MG tablet Take 5 tablets (100 mg total) by mouth as needed. Take about 30 min prior to sex. Caution stop taking if chest pain. Do not take with Nitro. 11/23/17   Karamalegos, [name], DO      VITAL SIGNS:  Blood pressure (!) 142/89, pulse 84, temperature (!) 97.5 F (36.4 C), temperature source Oral, resp. rate 16, height 6' (1.829 m), weight 131.5 kg, SpO2 94 %.  PHYSICAL EXAMINATION:  Physical Exam  Constitutional: He is oriented to person, place, and time. He appears well-developed and well-nourished. He is  active and cooperative.  Non-toxic appearance. He does not have a sickly appearance. He does not appear ill. No distress. He is not intubated.  HENT:  Head: Normocephalic and atraumatic.  Mouth/Throat: Oropharynx is clear and moist. No oropharyngeal exudate.  Eyes: Conjunctivae, EOM and lids are normal. No scleral icterus.  Neck: Neck supple. No JVD present. No thyromegaly present.  Cardiovascular: Normal rate, regular rhythm, S1 normal, S2 normal and normal heart sounds.  No extrasystoles are present. Exam reveals no gallop, no S3, no S4, no distant heart sounds and no friction rub.  No murmur heard. Pulmonary/Chest: Effort normal. No accessory muscle usage or stridor. No apnea, no tachypnea and no bradypnea. He is not intubated. No respiratory distress. He has decreased breath sounds in the right upper field, the right middle field, the right lower field, the left upper field, the left middle field and the left lower field. He has no wheezes. He has no rhonchi. He has no rales.  Abdominal: Soft. He exhibits no distension. Bowel sounds are decreased. There is no tenderness. There is no rigidity, no rebound and no guarding.  Musculoskeletal: Normal range of motion. He exhibits no edema or tenderness.       Right lower leg: Normal. He exhibits no tenderness and no edema.       Left lower leg: Normal. He exhibits no tenderness and no edema.  Lymphadenopathy:    He has no cervical adenopathy.  Neurological: He is alert and oriented to person, place, and time. He is not disoriented.  Skin: Skin is warm, dry and intact. No rash noted. He is not diaphoretic. No erythema. No pallor.  Psychiatric: He has a normal mood and affect. His speech is normal and behavior is normal. Judgment and thought content normal. His mood appears not anxious. He is not agitated. Cognition and memory are normal.   LABORATORY PANEL:   CBC Recent Labs  Lab 05/18/18 0545  WBC 9.4  HGB 14.3  HCT 42.2  PLT 227    ------------------------------------------------------------------------------------------------------------------  Chemistries  Recent Labs  Lab 05/18/18 0545  NA 141  K 3.8  CL 106  CO2 25  GLUCOSE 104*  BUN 25*  CREATININE 1.56*  CALCIUM 8.7*  MG 1.9  AST 25  ALT 23  ALKPHOS 34*  BILITOT 1.0   ------------------------------------------------------------------------------------------------------------------  Cardiac Enzymes Recent Labs  Lab 05/18/18 0545  TROPONINI 0.05*   ------------------------------------------------------------------------------------------------------------------  RADIOLOGY:  Dg Chest Portable 1 View  Result Date: 05/18/2018 CLINICAL DATA:  Acute onset of generalized chest pain. EXAM: PORTABLE CHEST 1 VIEW COMPARISON:  Chest radiograph performed 12/09/2016 FINDINGS: The lungs are well-aerated and clear. There is no evidence of focal opacification, pleural effusion or pneumothorax. The cardiomediastinal silhouette is mildly enlarged. An AICD is noted overlying the left chest wall, with a single lead ending overlying the right ventricle. No acute osseous abnormalities are seen. IMPRESSION: No acute cardiopulmonary process seen; mild cardiomegaly noted. Electronically Signed   By: Garald Balding M.D.   On: 05/18/2018 05:51   IMPRESSION AND PLAN:   A/P: 44M p/w CP/unstable angina, Trop-I elevation, concerning for early ACS. Hyperglycemia (w/ T2NIDDM), Cr elevation/CKD II, hypocalcemia. -CP/unstable angina, Trop-I elevation, possible early ACS: Pt p/w ~2d Hx L-sided non-exertional CP, onset at rest, severe. Pain-free at the time of my assessment. EKG (-) STE. Trop-I 0.05, rpt pending. CXR (-) acute cardiopulmonary process. Known Hx chronic systolic + diastolic CHF/NICM, w/ EF 16-10% w/ grade I diastolic dysfxn as of 96/10/5407 Echo. Has MedTronic AICD/PPM. Tele, continuous cardiac monitoring. C/w ASA, Statin, beta blocker, Entresto, Aldactone, diuretic.  Heparin gtt, hold Eliquis. Morphine, NTG, O2 PRN. Cardiology consult. -Hyperglycemia, T2NIDDM: SSI. Hold home antihyperglycemics. -Cr elevation/CKD II: Cr 1.56 on present admission, at baseline. Pt likely w/ baseline CKD II (2/2 DM, HTN, Cardiorenal Syndrome Type 2). Monitor BMP, avoid nephrotoxins. -Hypocalcemia: Ionized calcium. -c/w other home meds/formulary subs. -FEN/GI: NPO for now. -DVT PPx: Heparin gtt. -Code status: Full code. -Disposition: Admission, > 2 midnights.   All the records are reviewed and case discussed with ED provider. Management plans discussed with the patient, family and they are in agreement.  CODE STATUS: Full code.  TOTAL TIME TAKING CARE OF THIS PATIENT: 75 minutes.    Arta Silence M.D on 05/18/2018 at 7:33 AM  Between 7am to 6pm - Pager - [phone]  After 6pm go to [email or web address] - Proofreader  [facility]  Office  [phone]  CC: Primary care physician; Olin Hauser, DO   Note: This dictation was prepared with Dragon dictation along with smaller phrase technology. Any transcriptional errors that result from this process are unintentional.

## 2018-05-18 NOTE — Progress Notes (Addendum)
ANTICOAGULATION CONSULT NOTE - Initial Consult  Pharmacy Consult for heparin drip Indication: chest pain/ACS  Allergies  Allergen Reactions  . Tomato Rash    Patient Measurements: Height: 6' (182.9 cm) Weight: 290 lb (131.5 kg) IBW/kg (Calculated) : 77.6 Heparin Dosing Weight: 108 kg  Vital Signs: Temp: 97.5 F (36.4 C) (11/12 0529) Temp Source: Oral (11/12 0529) BP: 142/89 (11/12 0529) Pulse Rate: 84 (11/12 0529)  Labs: Recent Labs    05/18/18 0545  HGB 14.3  HCT 42.2  PLT 227  CREATININE 1.56*  TROPONINI 0.05*    Estimated Creatinine Clearance: 80.4 mL/min (A) (by C-G formula based on SCr of 1.56 mg/dL (H)).   Medical History: Past Medical History:  Diagnosis Date  . CHF (congestive heart failure) ([facility])   . Chronic systolic heart failure ([facility])    a. 10/2012 Echo: EF <20%, mod to sev dil LV, diast dysfxn, mildly dil LA/RA, mod to severe MR/TR.  [name] Coronary artery disease, non-occlusive    a. 09/2014 Cath: minor luminal irregularities with severely dilated left ventricle. LVEDP 30.  [name] H/O noncompliance with medical treatment, presenting hazards to health   . History of traumatic injury of head   . Hyperlipidemia   . Hypertension   . Morbid obesity ([facility])   . NICM (nonischemic cardiomyopathy) ([facility])    a. 10/2012 Echo: EF <20%.  . Polysubstance abuse ([facility])    a. cocaine, tobacco, THC, ETOH  . Seizure disorder ([facility])   . Stroke [facility])     Medications:  On Eliquis as outpatient  Assessment:  Goal of Therapy:  Heparin level 0.3-0.7 units/ml  APTT 66-102 Monitor platelets by anticoagulation protocol: Yes   Plan:  No bolus per consult order. 1400 units per hour initial rate. If baseline heparin level elevated, will follow and adjust by aPTT until correlation with heparin level. First aPTT 6 hours after start of infusion.  Anisia Leija S 05/18/2018,6:54 AM

## 2018-05-18 NOTE — Plan of Care (Signed)

## 2018-05-18 NOTE — Progress Notes (Signed)
1.  Atypical angina, history of nonischemic cardiomyopathy: Discontinue Eliquis as per cardiology, pending echocardiogram, troponins are negative for 2 times.  ICD interrogation today. 2.  Chronic systolic heart failure with EF 20 to 25% status post ICD placement, ICD interrogation today. 3.  History of proximal atrial fibrillation, history of nonsustained V. tach: On Eliquis at home, Eliquis on hold for 48 hours .4.CKD stage 3; 'stable. Agree with current meds,lab work,.

## 2018-05-19 DIAGNOSIS — R7989 Other specified abnormal findings of blood chemistry: Secondary | ICD-10-CM | POA: Diagnosis not present

## 2018-05-19 DIAGNOSIS — I480 Paroxysmal atrial fibrillation: Secondary | ICD-10-CM

## 2018-05-19 DIAGNOSIS — I248 Other forms of acute ischemic heart disease: Secondary | ICD-10-CM

## 2018-05-19 DIAGNOSIS — E1165 Type 2 diabetes mellitus with hyperglycemia: Secondary | ICD-10-CM | POA: Diagnosis not present

## 2018-05-19 DIAGNOSIS — I200 Unstable angina: Secondary | ICD-10-CM | POA: Diagnosis not present

## 2018-05-19 DIAGNOSIS — I5022 Chronic systolic (congestive) heart failure: Secondary | ICD-10-CM | POA: Diagnosis not present

## 2018-05-19 DIAGNOSIS — R079 Chest pain, unspecified: Secondary | ICD-10-CM | POA: Diagnosis not present

## 2018-05-19 LAB — GLUCOSE, CAPILLARY
GLUCOSE-CAPILLARY: 81 mg/dL (ref 70–99)
Glucose-Capillary: 85 mg/dL (ref 70–99)

## 2018-05-19 LAB — APTT: APTT: 62 s — AB (ref 24–36)

## 2018-05-19 LAB — CBC
HEMATOCRIT: 42.9 % (ref 39.0–52.0)
HEMOGLOBIN: 14.2 g/dL (ref 13.0–17.0)
MCH: 32.3 pg (ref 26.0–34.0)
MCHC: 33.1 g/dL (ref 30.0–36.0)
MCV: 97.7 fL (ref 80.0–100.0)
Platelets: 196 10*3/uL (ref 150–400)
RBC: 4.39 MIL/uL (ref 4.22–5.81)
RDW: 12.2 % (ref 11.5–15.5)
WBC: 7.5 10*3/uL (ref 4.0–10.5)
nRBC: 0 % (ref 0.0–0.2)

## 2018-05-19 LAB — HEPARIN LEVEL (UNFRACTIONATED): HEPARIN UNFRACTIONATED: 0.62 [IU]/mL (ref 0.30–0.70)

## 2018-05-19 LAB — CALCIUM, IONIZED: CALCIUM, IONIZED, SERUM: 5 mg/dL (ref 4.5–5.6)

## 2018-05-19 MED ORDER — METOPROLOL SUCCINATE ER 25 MG PO TB24: 37.5000 mg | ORAL_TABLET | Freq: Every day | ORAL | 0 refills | Status: DC

## 2018-05-19 MED ORDER — METOPROLOL SUCCINATE ER 25 MG PO TB24
37.5000 mg | ORAL_TABLET | Freq: Every day | ORAL | Status: DC
  Administered 2018-05-19: 37.5 mg via ORAL
  Filled 2018-05-19: qty 2

## 2018-05-19 MED ORDER — APIXABAN 5 MG PO TABS
5.0000 mg | ORAL_TABLET | Freq: Two times a day (BID) | ORAL | Status: DC
  Administered 2018-05-19: 5 mg via ORAL
  Filled 2018-05-19: qty 1

## 2018-05-19 NOTE — Progress Notes (Signed)
Cardiovascular and Pulmonary Nurse Navigator Note:    47 year old with chronic systolic heart failure due to NICM, has ICD, minimal CAD in 2016, PAF, HTN, HLD, DM2, morbid obesity, and OSA on CPAP, admitted with atypical chest and minimal troponin elevation.    Rounded on patient.  Discussed CHF with patient.  Patient is euvolemic per Dr. Serita Kyle note today.  Echo performed this admission:    ------------------------------------------------------------------- Transthoracic Echocardiography  Patient:    Kent Stanley, Kent Stanley MR #:       [id number] Study Date: 05/18/2018 Gender:     M Age:        47 Height:     182.9 cm Weight:     135.2 kg BSA:        2.68 m^2 Pt. Status: Room:   SONOGRAPHER  [facility]  PERFORMING   [facility]  ATTENDING    Michaelle Birks, Jacquelyn D  Carie Caddy, Jacquelyn D  REFERRING    Marisue Ivan D  cc:  ------------------------------------------------------------------- LV EF: 10% -   15%  ------------------------------------------------------------------- History:   PMH:  Chronic systolic CHF, HLD, H/O non- compliance with medical treatment, NICM, AICD, polysubstance abuse.  Coronary artery disease.  Stroke.  Risk factors:  Hypertension.  ------------------------------------------------------------------- Study Conclusions  - Procedure narrative: Transthoracic echocardiography for left   ventricular function evaluation, for right ventricular function   evaluation, and for assessment of valvular function. Image   quality was suboptimal. - Left ventricle: The cavity size was severely dilated. Wall   thickness was increased in a pattern of mild LVH. The estimated   ejection fraction was in the range of 10% to 15%. Diffuse   hypokinesis. Regional wall motion abnormalities cannot be   excluded. The study is not technically sufficient to allow   evaluation of LV diastolic function. - Aortic root: The aortic root was mildly dilated. -  Right ventricle: The cavity size was normal. Systolic function   was normal.  Previous Echo performed on 01/02/2018 EF was 20-25%.    CHF EDUCATION REVIEWED:   Patient informed this RN he has working scales and weighs himself every morning.  Upon asking patient if he gains weight of 2 - 3 pounds overnight or 5 pounds in one week, what action does he take, patient responded, "I contact my PCP."  Patient stated he does not have any problems getting in to see his PCP when needed.  Patient stated he follows a low sodium diabetic diet.  Patient reports having lost 14 pounds, exercises by riding his bicycle, works 16 hours per week, is on partial disability, and feels better when he stays busy.  Patient reported that his chest pain occurs when he is actually at rest.  His chest pain is relieved when he exercises or is busy.  Patient reports wearing his CPAP machine and washes tubing at least twice a week or more often. Patient also talked about how staying busy helps him emotionally / mentally.    Discussed Cardiac Rehab with patient.  Explained to patient under Medicare guidelines he is a candidate for Cardiac Rehab.  Overview of the program provided.  Patient expressed interest, but stated he is in the process of moving to [facility] in the next three weeks, so he is not interested in Cardiac Rehab at this time.  Patient stated he would discuss with his Cardiologist.    Patient declined CHF educational materials.  Patient stated he has read a lot and has educational  materials at home.    Army Melia, RN, BSN, [facility]  [place]  [facility] Cardiac & Pulmonary Rehab  Cardiovascular & Pulmonary Nurse Navigator  Direct Line: [phone]  Department Phone #: [phone] Fax: [phone]  Email Address: [email or web address]

## 2018-05-19 NOTE — Progress Notes (Signed)
Kent Stanley is admitted on November 12 and discharged on November 13, can resume his routine duties from the home but 14th without limitation. Please call [facility] at [phone].

## 2018-05-19 NOTE — Discharge Instructions (Signed)

## 2018-05-19 NOTE — Care Management Obs Status (Signed)
MEDICARE OBSERVATION STATUS NOTIFICATION   Patient Details  Name: Kent Stanley. MRN: [id number] Date of Birth: 01-27-1971   Medicare Observation Status Notification Given:  Yes    Eber Hong, RN 05/19/2018, 1:37 PM

## 2018-05-19 NOTE — Progress Notes (Signed)
d  Progress Note  Patient Name: Kent Stanley. Date of Encounter: 05/19/2018  Primary Cardiologist: Lorine Bears, MD  Subjective   Patient feels well.  No further chest pain.  No shortness of breath or edema.  Slept well with BiPAP last night.  Inpatient Medications    Scheduled Meds: . aspirin  81 mg Oral Daily  . furosemide  40 mg Oral Daily  . insulin aspart  0-15 Units Subcutaneous TID WC  . insulin aspart  0-5 Units Subcutaneous QHS  . metoprolol succinate  37.5 mg Oral Daily  . pravastatin  80 mg Oral Daily  . sacubitril-valsartan  1 tablet Oral BID  . spironolactone  12.5 mg Oral Daily   Continuous Infusions:  PRN Meds: acetaminophen, bisacodyl, morphine injection, nitroGLYCERIN, ondansetron (ZOFRAN) IV, senna-docusate   Vital Signs    Vitals:   05/18/18 2045 05/19/18 0349 05/19/18 0637 05/19/18 0724  BP: (!) 145/93 118/85 133/90 114/90  Pulse: 76 83 84 74  Resp:      Temp: 98.4 F (36.9 C) 98.2 F (36.8 C)    TempSrc: Oral Oral    SpO2: 94% 93%  97%  Weight:  134.6 kg    Height:        Intake/Output Summary (Last 24 hours) at 05/19/2018 0901 Last data filed at 05/19/2018 0152 Gross per 24 hour  Intake 120.25 ml  Output 525 ml  Net -404.75 ml   Filed Weights   05/18/18 0528 05/18/18 0833 05/19/18 0349  Weight: 131.5 kg 135.2 kg 134.6 kg    Telemetry    NSR, sinus tachycardia and sinus bradycardia (while asleep) - Personally Reviewed  ECG    No new tracing.  Physical Exam   GEN: No acute distress.   Neck: No JVD, though body habitus limits evaluation. Cardiac: Distant heart sounds. RRR, no murmurs, rubs, or gallops.  Respiratory: Clear to auscultation bilaterally. GI: Soft, nontender, non-distended  MS: No edema; No deformity. Neuro:  Nonfocal  Psych: Normal affect   Labs    Chemistry Recent Labs  Lab 05/18/18 0545  NA 141  K 3.8  CL 106  CO2 25  GLUCOSE 104*  BUN 25*  CREATININE 1.56*  CALCIUM 8.7*  PROT 7.5    ALBUMIN 3.9  AST 25  ALT 23  ALKPHOS 34*  BILITOT 1.0  GFRNONAA 51*  GFRAA 59*  ANIONGAP 10     Hematology Recent Labs  Lab 05/18/18 0545 05/19/18 0422  WBC 9.4 7.5  RBC 4.42 4.39  HGB 14.3 14.2  HCT 42.2 42.9  MCV 95.5 97.7  MCH 32.4 32.3  MCHC 33.9 33.1  RDW 12.3 12.2  PLT 227 196    Cardiac Enzymes Recent Labs  Lab 05/18/18 0828 05/18/18 1015 05/18/18 1545 05/18/18 2152  TROPONINI 0.06* 0.05* 0.05* 0.06*   No results for input(s): TROPIPOC in the last 168 hours.   BNPNo results for input(s): BNP, PROBNP in the last 168 hours.   DDimer No results for input(s): DDIMER in the last 168 hours.   Radiology    Dg Chest Portable 1 View  Result Date: 05/18/2018 CLINICAL DATA:  Acute onset of generalized chest pain. EXAM: PORTABLE CHEST 1 VIEW COMPARISON:  Chest radiograph performed 12/09/2016 FINDINGS: The lungs are well-aerated and clear. There is no evidence of focal opacification, pleural effusion or pneumothorax. The cardiomediastinal silhouette is mildly enlarged. An AICD is noted overlying the left chest wall, with a single lead ending overlying the right ventricle. No acute osseous abnormalities  are seen. IMPRESSION: No acute cardiopulmonary process seen; mild cardiomegaly noted. Electronically Signed   By: Roanna Raider M.D.   On: 05/18/2018 05:51    Cardiac Studies   TTE (05/18/18): - Procedure narrative: Transthoracic echocardiography for left   ventricular function evaluation, for right ventricular function   evaluation, and for assessment of valvular function. Image   quality was suboptimal. - Left ventricle: The cavity size was severely dilated. Wall   thickness was increased in a pattern of mild LVH. The estimated   ejection fraction was in the range of 10% to 15%. Diffuse   hypokinesis. Regional wall motion abnormalities cannot be   excluded. The study is not technically sufficient to allow   evaluation of LV diastolic function. - Aortic root:  The aortic root was mildly dilated. - Right ventricle: The cavity size was normal. Systolic function   was normal.  Device interrogation (05/17/18): Episode of NSVT on 03/16/18.  No events to explain symptoms leading to admission.  Patient Profile     47 y.o. male chronic systolic heart failure due to NICM, minimal CAD by LHC in 2016, PAF, HTN, HLD, DM2, morbid obesity, and OSA on CPAP, admitted with atypical chest pain and minimal troponin elevation.  Assessment & Plan    Demand ischemia Chest pain is atypical, usually at rest.  Troponin has been flat and most likely represents supply-demand mismatch in the setting of severe systolic heart failure.  No symptoms since admission.  Given atypical nature of pain and minimal CAD on catheterization in 2016, I do not recommend further inpatient ischemia evaluation.  D/c heparin infusion and restart apixaban.  Increase metoprolol succinate to 37.5 mg daily.  If patient is able to ambulate without difficulty after receiving increased dose of metoprolol, he can be discharged home and have close outpatient follow-up in our office.  Chronic systolic heart failure due to non-ischemic cardiomyopathy Patient appears euvolemic and well-compensated with NYHA class II symptoms.  Increase metoprolol succinate to 37.5 mg daily to see if this helps improve atypical chest pain as well as to optimize evidence-based HF therapy.  Continue current doses of Entresto, spironolactone, and furosemide.  Paroxysmal atrial fibrillation No evidence of a-fib noted on ICD interrogation.  Increase metoprolol, as above.  D/c heparin and restart apixaban 5 mg BID.  NSVT Episode noted in 03/2018; no recent events on device interrogation.  Increase metoprolol, as above.  Maintain K and Mg greater than 4.0 and 2.0, respectively.  Optimize evidence-based HF therapy.  Continue f/u in device clinic.  [facility] will sign off.   Medication Recommendations:   Increase metoprolol succinate to 37.5 mg daily. Other recommendations (labs, testing, etc):  None Follow up as an outpatient:  1-2 weeks with Dr. Kirke Corin or APP.  For questions or updates, please contact [facility] Please consult [email or web address] for contact info under [facility].     Signed, Yvonne Kendall, MD  05/19/2018, 9:01 AM

## 2018-05-19 NOTE — Care Management (Signed)
EF 10-15%. ICD interrogation. Bump in troponins flet to be demand.

## 2018-05-19 NOTE — Care Management CC44 (Signed)
Condition Code 44 Documentation Completed  Patient Details  Name: Kent Stanley. MRN: [id number] Date of Birth: 1970/12/12   Condition Code 44 given:  Yes Patient signature on Condition Code 44 notice:  Yes Documentation of 2 MD's agreement:  Yes Code 44 added to claim:  Yes    Eber Hong, RN 05/19/2018, 1:37 PM

## 2018-05-20 ENCOUNTER — Telehealth: Payer: Self-pay | Admitting: Cardiovascular Disease

## 2018-05-20 NOTE — Telephone Encounter (Signed)
Left voicemail message to call back  

## 2018-05-20 NOTE — Telephone Encounter (Signed)
Patient scheduled for TCM on 06/02/18 with Eula Listen

## 2018-05-20 NOTE — Discharge Summary (Signed)
Kent Stanley., is a 47 y.o. male  DOB July 23, 1970  MRN [id number].  Admission date:  05/18/2018  Admitting Physician  Arta Silence, MD  Discharge Date:  05/19/2018   Primary MD  Olin Hauser, DO  Recommendations for primary care physician for things to follow:   Follow with PCP in 1 week    follow-up with Dr. Fletcher [name] in 1 to 2 weeks.   Admission Diagnosis  NSTEMI (non-ST elevated myocardial infarction) ([place]) [I21.4] On continuous oral anticoagulation [Z79.01]   Discharge Diagnosis  NSTEMI (non-ST elevated myocardial infarction) ([place]) [I21.4] On continuous oral anticoagulation [Z79.01]    Active Problems:   Unstable angina ([place])   Atypical chest pain      Past Medical History:  Diagnosis Date  . CHF (congestive heart failure) ([place])   . Chronic systolic heart failure ([name])    a. 10/2012 Echo: EF <20%, mod to sev dil LV, diast dysfxn, mildly dil LA/RA, mod to severe MR/TR.  [name] Coronary artery disease, non-occlusive    a. 09/2014 Cath: minor luminal irregularities with severely dilated left ventricle. LVEDP 30.  [name] H/O noncompliance with medical treatment, presenting hazards to health   . History of traumatic injury of head   . Hyperlipidemia   . Hypertension   . Morbid obesity ([name])   . NICM (nonischemic cardiomyopathy) ([place])    a. 10/2012 Echo: EF <20%.  . Polysubstance abuse ([name])    a. cocaine, tobacco, THC, ETOH  . Seizure disorder ([name])   . Stroke [facility])     Past Surgical History:  Procedure Laterality Date  . CARDIAC CATHETERIZATION     [facility]  . ICD IMPLANT N/A 12/08/2016   Procedure: ICD Implant;  Surgeon: Deboraha Sprang, MD;  Location: [place] [facility];  Service: Cardiovascular;  Laterality: N/A;       History of present illness and  Hospital Course:     Kindly see H&P for  history of present illness and admission details, please review complete Labs, Consult reports and Test reports for all details in brief  HPI  from the history and physical done on the day of admission 47 year old man with multiple medical problems of chronic systolic heart failure due to nonischemic cardiomyopathy followed by Dr. Fletcher [name], Dr. [name] comes in because of chest pain started 2 days ago, admitted for evaluation of chest pain, possible non-ST elevation MI.  Patient had severe 8 out of 10 severe chest pain in the middle of the chest, noted to have shortness of breath, palpitations, lightheadedness.  Patient had ICD but did not have an ICD shocks.   Hospital Course  Left sided chest pain, atypical likely musculoskeletal: Patient troponins have been in the range of 0.05, 0.06.  Seen by cardiology from [facility] health because of his history of nonischemic cardiomyopathy, status post ICD, chronic systolic heart failure.  Patient apixaban was held to initiate heparin drip for possible need for invasive work-up including cardiac cath. However patient did not have further elevation of troponins, his chest pain resolved.  So patient did not have any further cardiac work-up except echocardiogram. 2.  Chronic systolic heart failure, echocardiogram this time showed EF 20 to 25% with diffuse hypokinesia, patient chest pain thought to be demand ischemia rather than acute heart attack.  Cardiology did not recommend further work-up, advised to restart Eliquis, other heart failure medicines including Entresto. 3.  Paroxysmal atrial fibrillation, patient ICD interrogated, no evidence of A. fib on ICD interrogation, metoprolol increased to  37.5 mg daily to see if it helps with his blood pressure control, patient is advised to continue Entresto, spironolactone, Lasix at discharge. Patient discharged home in stable condition, essentially patient chest pain thought to be demand ischemia rather than acute heart attack.   Patient remained asymptomatic and eager to go home so discharged home, he wanted work note for his hospital stay which we provided.  Patient can follow-up with the doctor read as an outpatient for further titration of his heart failure medicines.    Discharge Condition: Stable   Follow UP      Discharge Instructions  and  Discharge Medications      Allergies as of 05/19/2018      Reactions   Tomato Rash      Medication List    TAKE these medications   ACCU-CHEK AVIVA PLUS test strip Generic drug:  glucose blood Check blood sugar up to 1 x daily   ACCU-CHEK AVIVA PLUS w/Device Kit Check blood sugar up to 1 x daily   ACCU-CHEK SOFTCLIX LANCETS lancets Check blood sugar up to 1 x daily   apixaban 5 MG Tabs tablet Commonly known as:  ELIQUIS Take 1 tablet (5 mg total) by mouth 2 (two) times daily.   blood glucose meter kit and supplies Kit Dispense based on patient and insurance preference. Check blood glucose once daily.   cyclobenzaprine 10 MG tablet Commonly known as:  FLEXERIL Take 0.5-1 tablets (5-10 mg total) by mouth 3 (three) times daily as needed for muscle spasms.   ENTRESTO 49-51 MG Generic drug:  sacubitril-valsartan take 1 tablet by mouth twice a day   furosemide 40 MG tablet Commonly known as:  LASIX Take '40mg'$  in morning. Take half tab for dose 20 mg tablet every evening.   gabapentin 100 MG capsule Commonly known as:  NEURONTIN Start 1 capsule daily, increase by 1 cap every 2-3 days as tolerated up to 3 times a day, or may take 3 at once in evening.   metoprolol succinate 25 MG 24 hr tablet Commonly known as:  TOPROL-XL Take 1.5 tablets (37.5 mg total) by mouth daily. What changed:  how much to take   pravastatin 40 MG tablet Commonly known as:  PRAVACHOL TAKE 2 TABLETS BY MOUTH ONCE DAILY   predniSONE 10 MG tablet Commonly known as:  DELTASONE Take 6 tabs with breakfast Day 1, 5 tabs Day 2, 4 tabs Day 3, 3 tabs Day 4, 2 tabs Day 5, 1  tab Day 6.   Semaglutide(0.25 or 0.'5MG'$ /DOS) 2 MG/1.5ML Sopn Inject 0.5 mg into the skin once a week. For first 4 weeks, inject dose 0.'25mg'$  weekly   sildenafil 20 MG tablet Commonly known as:  REVATIO Take 5 tablets (100 mg total) by mouth as needed. Take about 30 min prior to sex. Caution stop taking if chest pain. Do not take with Nitro.   spironolactone 25 MG tablet Commonly known as:  ALDACTONE Take 0.5 tablets (12.5 mg total) by mouth daily.         Diet and Activity recommendation: See Discharge Instructions above   Consults obtained -cardiology   Major procedures and Radiology Reports - PLEASE review detailed and final reports for all details, in brief -      Dg Chest Portable 1 View  Result Date: 05/18/2018 CLINICAL DATA:  Acute onset of generalized chest pain. EXAM: PORTABLE CHEST 1 VIEW COMPARISON:  Chest radiograph performed 12/09/2016 FINDINGS: The lungs are well-aerated and clear. There is no evidence of  focal opacification, pleural effusion or pneumothorax. The cardiomediastinal silhouette is mildly enlarged. An AICD is noted overlying the left chest wall, with a single lead ending overlying the right ventricle. No acute osseous abnormalities are seen. IMPRESSION: No acute cardiopulmonary process seen; mild cardiomegaly noted. Electronically Signed   By: Garald Balding M.D.   On: 05/18/2018 05:51    Micro Results     No results found for this or any previous visit (from the past 240 hour(s)).     Today   Subjective:   Curry Seefeldt today has no headache,no chest abdominal pain,no new weakness tingling or numbness, feels much better wants to go home today.   Objective:   Blood pressure 114/90, pulse 74, temperature 98.2 F (36.8 C), temperature source Oral, resp. rate 15, height 6' (1.829 m), weight 134.6 kg, SpO2 97 %.  No intake or output data in the 24 hours ending 05/20/18 1955  Exam Awake Alert, Oriented x 3, No new F.N deficits, Normal  affect .AT,PERRAL Supple Neck,No JVD, No cervical lymphadenopathy appriciated.  Symmetrical Chest wall movement, Good air movement bilaterally, CTAB RRR,No Gallops,Rubs or new Murmurs, No Parasternal Heave +ve B.Sounds, Abd Soft, Non tender, No organomegaly appriciated, No rebound -guarding or rigidity. No Cyanosis, Clubbing or edema, No new Rash or bruise  Data Review   CBC w Diff:  Lab Results  Component Value Date   WBC 7.5 05/19/2018   HGB 14.2 05/19/2018   HGB 14.8 12/02/2016   HCT 42.9 05/19/2018   HCT 42.8 12/02/2016   PLT 196 05/19/2018   PLT 221 12/02/2016   LYMPHOPCT 26 05/18/2018   LYMPHOPCT 14.9 06/21/2014   MONOPCT 10 05/18/2018   MONOPCT 9.3 06/21/2014   EOSPCT 2 05/18/2018   EOSPCT 3.1 06/21/2014   BASOPCT 1 05/18/2018   BASOPCT 0.5 06/21/2014    CMP:  Lab Results  Component Value Date   NA 141 05/18/2018   NA 141 12/02/2016   NA 138 06/21/2014   K 3.8 05/18/2018   K 4.2 06/21/2014   CL 106 05/18/2018   CL 103 06/21/2014   CO2 25 05/18/2018   CO2 32 06/21/2014   BUN 25 (H) 05/18/2018   BUN 23 12/02/2016   BUN 23 (H) 06/21/2014   CREATININE 1.56 (H) 05/18/2018   CREATININE 1.62 (H) 02/12/2016   PROT 7.5 05/18/2018   PROT 7.5 06/21/2014   ALBUMIN 3.9 05/18/2018   ALBUMIN 3.4 06/21/2014   BILITOT 1.0 05/18/2018   BILITOT 0.7 06/21/2014   ALKPHOS 34 (L) 05/18/2018   ALKPHOS 55 06/21/2014   AST 25 05/18/2018   AST 42 (H) 06/21/2014   ALT 23 05/18/2018   ALT 36 06/21/2014  .   Total Time in preparing paper work, data evaluation and todays exam - 35 minutes  Epifanio Lesches M.D on 05/19/2018 at 7:55 PM    Note: This dictation was prepared with Dragon dictation along with smaller phrase technology. Any transcriptional errors that result from this process are unintentional.

## 2018-05-20 NOTE — Telephone Encounter (Signed)
-----   Message from Jefferey Pica, RN sent at 05/20/2018  9:37 AM EST ----- Regarding: FW: TOC   ----- Message ----- From: Yvonne Kendall, MD Sent: 05/19/2018   9:11 AM EST To: Iran Ouch, MD, [facility] Scheduling, # Subject: TOC                                            Good morning,  Can you help arrange for a 1-2 week f/u appointment for Mr. Pirolli.  He will likely be d/c'ed from [facility] today.  His primary cardiologist is Dr. Kirke Corin.  Thanks.  Thayer Ohm

## 2018-05-21 DIAGNOSIS — S93602A Unspecified sprain of left foot, initial encounter: Secondary | ICD-10-CM | POA: Diagnosis not present

## 2018-05-21 DIAGNOSIS — X58XXXA Exposure to other specified factors, initial encounter: Secondary | ICD-10-CM | POA: Diagnosis not present

## 2018-05-21 DIAGNOSIS — Z87891 Personal history of nicotine dependence: Secondary | ICD-10-CM | POA: Diagnosis not present

## 2018-05-21 DIAGNOSIS — I509 Heart failure, unspecified: Secondary | ICD-10-CM | POA: Diagnosis not present

## 2018-05-21 DIAGNOSIS — Z7982 Long term (current) use of aspirin: Secondary | ICD-10-CM | POA: Diagnosis not present

## 2018-05-21 DIAGNOSIS — M7732 Calcaneal spur, left foot: Secondary | ICD-10-CM | POA: Diagnosis not present

## 2018-05-21 DIAGNOSIS — I130 Hypertensive heart and chronic kidney disease with heart failure and stage 1 through stage 4 chronic kidney disease, or unspecified chronic kidney disease: Secondary | ICD-10-CM | POA: Diagnosis not present

## 2018-05-21 DIAGNOSIS — N189 Chronic kidney disease, unspecified: Secondary | ICD-10-CM | POA: Diagnosis not present

## 2018-05-21 DIAGNOSIS — Z8673 Personal history of transient ischemic attack (TIA), and cerebral infarction without residual deficits: Secondary | ICD-10-CM | POA: Diagnosis not present

## 2018-05-24 NOTE — Telephone Encounter (Signed)
Patient contacted regarding discharge from [facility] on 05/19/2018.  Patient understands to follow up with provider Eula Listen on 06/02/18 at 1000 at [facility], [place].  Pt reports he is unable to make this appt d/t newly injured foot. He would like to reschedule to first week of December (either M, W, or F am).  I told him the office will be in contact w him about new appt time and date. Patient understands discharge instructions? yes Patient understands medications and regiment? yes Patient understands to bring all medications to this visit? yes  Request forwarded to scheduling: "Patient unable to make appt on 06/02/18 @ 1000 with Eula Listen. He asked if we could reschedule his follow up appt to the first week of December, either M/W/F in am."

## 2018-06-02 ENCOUNTER — Ambulatory Visit: Payer: Medicare Other | Admitting: Physician Assistant

## 2018-06-05 NOTE — Progress Notes (Deleted)
Cardiology Office Note Date:  06/05/2018  Patient ID:  Kent Hagarty., DOB [birth year], MRN [id number] PCP:  Smitty Cords, DO  Cardiologist:  Dr. Kirke Corin, MD Electrophysiologist: Dr. Graciela Husbands, MD   ***refresh   Chief Complaint: ***  History of Present Illness: Kent Curatola. is a 47 y.o. male with history of HFrEF secondary to NICM, seizure disorder since age 78, prior strokes previously on Plavix, Afib noted on device interrogation now on Eliquis, CKD stage III, prior marijuana and cocaine abuse, prior tobacco abuse, DM, HTN, morbid obesity, noncompliance, NSVT, and severe sleep apnea with symptom improvement with CPAP who presents for hospital follow up after recent admission to [facility] from 11/12 through 11/13 for ***.   He previously was noted to have an EF of < 20%. Over the years, he was noted to have been more compliant with medications. Cardiac cath in 09/2014 showed minor luminal irregularities, without evidence of obstructive CAD and an LVEDP of 30 mmHg. Echo in 03/2016 showed an EF of 20-25%. He underwent implantation of MDT MRI compatible ICD in 12/2016. He suffered a third stroke in 12/2017. Device interrogation showed Afib. His Plavix was stopped and he was started on Eliquis. Echo in 12/2017 showed an EF of 20-25%, severe concentric LVH, LV apical false tendon, diffuse HK, Gr1DD, LV filling pressure elevated, moderate MR, LA normal in size, RV cavity was mildly dilated, RVSF normal, trivial TR, PASP 22.  He was admitted to [facility] 11/12 through 11/13 for atypical chest pain with minimal troponin elevation with a peak of 0.06. Echo showed an EF of 10-15%, mild LVH, diffuse HK, mildly dilated aortic root, RVSF normal. No evidence of Afib was noted on device interrogation. Discharge weight noted to be 134.6 kg.  Labs: CBC unremarkable, magnesium 1.9, potassium 3.8, SCr 1.56  ***    Past Medical History:  Diagnosis Date  . CHF (congestive heart failure) (H[facility])   .  Chronic systolic heart failure ([facility])    a. 10/2012 Echo: EF <20%, mod to sev dil LV, diast dysfxn, mildly dil LA/RA, mod to severe MR/TR.  [name] Coronary artery disease, non-occlusive    a. 09/2014 Cath: minor luminal irregularities with severely dilated left ventricle. LVEDP 30.  [name] H/O noncompliance with medical treatment, presenting hazards to health   . History of traumatic injury of head   . Hyperlipidemia   . Hypertension   . Morbid obesity ([facility])   . NICM (nonischemic cardiomyopathy) ([facility])    a. 10/2012 Echo: EF <20%.  . Polysubstance abuse ([facility])    a. cocaine, tobacco, THC, ETOH  . Seizure disorder ([facility])   . Stroke [facility])     Past Surgical History:  Procedure Laterality Date  . CARDIAC CATHETERIZATION     [facility]  . ICD IMPLANT N/A 12/08/2016   Procedure: ICD Implant;  Surgeon: Duke Salvia, MD;  Location: [facility] INVASIVE CV LAB;  Service: Cardiovascular;  Laterality: N/A;    No outpatient medications have been marked as taking for the 06/08/18 encounter (Appointment) with Sondra Barges, PA-C.    Allergies:   Tomato   Social History:  The patient  reports that he quit smoking about 5 months ago. His smoking use included cigars. He has quit using smokeless tobacco. He reports that he drinks alcohol. He reports that he does not use drugs.   Family History:  The patient's family history includes Brain cancer in his mother; Diabetes in his father and mother; Heart attack in his father  and mother; Heart disease in his father; Heart failure in his father, maternal aunt, and mother; Hyperlipidemia in his father; Hypertension in his father; Sleep apnea in his brother, father, mother, and sister.  ROS:   ROS   PHYSICAL EXAM: *** VS:  There were no vitals taken for this visit. BMI: There is no height or weight on file to calculate BMI.  Physical Exam   EKG:  Was ordered and interpreted by me today. Shows ***  Recent Labs: 01/02/2018: TSH 2.696 05/18/2018: ALT 23; BUN 25; Creatinine, Ser  1.56; Magnesium 1.9; Potassium 3.8; Sodium 141 05/19/2018: Hemoglobin 14.2; Platelets 196  01/02/2018: Cholesterol 163; HDL 33; LDL Cholesterol 109; Total CHOL/HDL Ratio 4.9; Triglycerides 103; VLDL 21   CrCl cannot be calculated (Unknown ideal weight.).   Wt Readings from Last 3 Encounters:  05/19/18 296 lb 12.8 oz (134.6 kg)  04/20/18 295 lb 8 oz (134 kg)  04/13/18 294 lb (133.4 kg)     Other studies reviewed: Additional studies/records reviewed today include: summarized above  ASSESSMENT AND PLAN:  1. ***  Disposition: F/u with Dr. Kirke Corin or an APP in ***   Current medicines are reviewed at length with the patient today.  The patient did not have any concerns regarding medicines.  Signed, Eula Listen, PA-C 06/05/2018 9:17 AM     [facility] [place] [address] [place], [place] [address] [phone]

## 2018-06-08 ENCOUNTER — Ambulatory Visit: Payer: Medicare Other | Admitting: Physician Assistant

## 2018-06-09 ENCOUNTER — Ambulatory Visit: Payer: Medicare Other | Admitting: Nurse Practitioner

## 2018-06-15 ENCOUNTER — Ambulatory Visit: Payer: Medicare Other | Admitting: Cardiovascular Disease

## 2018-06-24 ENCOUNTER — Ambulatory Visit (INDEPENDENT_AMBULATORY_CARE_PROVIDER_SITE_OTHER): Payer: Medicare Other

## 2018-06-24 DIAGNOSIS — I5022 Chronic systolic (congestive) heart failure: Secondary | ICD-10-CM | POA: Diagnosis not present

## 2018-06-24 DIAGNOSIS — I428 Other cardiomyopathies: Secondary | ICD-10-CM

## 2018-06-24 NOTE — Progress Notes (Signed)
Remote ICD transmission.   

## 2018-07-02 ENCOUNTER — Other Ambulatory Visit: Payer: Self-pay | Admitting: Family Medicine

## 2018-07-02 DIAGNOSIS — N5201 Erectile dysfunction due to arterial insufficiency: Secondary | ICD-10-CM

## 2018-07-02 DIAGNOSIS — E782 Mixed hyperlipidemia: Secondary | ICD-10-CM

## 2018-07-02 MED ORDER — SILDENAFIL CITRATE 20 MG PO TABS: 100.0000 mg | ORAL_TABLET | ORAL | 5 refills | Status: DC | PRN

## 2018-07-02 NOTE — Telephone Encounter (Signed)
Pt needs a refill on sildenafil sent to [facility] [address]  His call back [phone]

## 2018-07-12 ENCOUNTER — Encounter: Payer: Self-pay | Admitting: Family Medicine

## 2018-07-12 ENCOUNTER — Ambulatory Visit (INDEPENDENT_AMBULATORY_CARE_PROVIDER_SITE_OTHER): Payer: Medicare Other | Admitting: Family Medicine

## 2018-07-12 VITALS — BP 126/84 | HR 88 | Temp 98.5°F | Resp 16 | Ht 72.0 in | Wt 299.6 lb

## 2018-07-12 DIAGNOSIS — N183 Chronic kidney disease, stage 3 unspecified: Secondary | ICD-10-CM

## 2018-07-12 DIAGNOSIS — E1169 Type 2 diabetes mellitus with other specified complication: Secondary | ICD-10-CM

## 2018-07-12 DIAGNOSIS — Z6841 Body Mass Index (BMI) 40.0 and over, adult: Secondary | ICD-10-CM

## 2018-07-12 DIAGNOSIS — E785 Hyperlipidemia, unspecified: Secondary | ICD-10-CM

## 2018-07-12 DIAGNOSIS — E1121 Type 2 diabetes mellitus with diabetic nephropathy: Secondary | ICD-10-CM

## 2018-07-12 DIAGNOSIS — E782 Mixed hyperlipidemia: Secondary | ICD-10-CM | POA: Diagnosis not present

## 2018-07-12 NOTE — Assessment & Plan Note (Signed)
Persistent CKD-III based on last lab Due for repeat Cr check, will draw today Secondary to HTN DM and other factors

## 2018-07-12 NOTE — Progress Notes (Signed)
Subjective:    Patient ID: Kent Geralds., male    DOB: [birth year], 48 y.o.   MRN: [id number]  Kent Collin. is a 48 y.o. male presenting on 07/12/2018 for Diabetes   HPI   CHRONIC DM, Type 2 with nephropathy CKD-III/ Morbid Obesity BMI >40 / Hyperlipidemia Doing well. Has continued with GLP1 with good results - Additionally he asks about Thyroid hormone testing, he states was told in past to have this checked. CBGs: Avg110-120s, Low>90, High<150. Checks CBGs4-5x weekly Meds:Ozempic 0.5mg  weekly [place] injection Reports good compliance. Tolerating well w/o side-effects Currently on ARB(Entresto) Lifestyle: - Diet (reducing portions, low carb, heart healthy)  - Exercise (walking more regularly and exercise - he has not scheduled DM Eye Exam at this point. Requesting locations to go. Denies hypoglycemia, polyuria, visual changes, numbness or tingling.   Depression screen [facility] 2/9 07/12/2018 04/20/2018 04/13/2018  Decreased Interest 0 0 0  Down, Depressed, Hopeless 0 0 0  PHQ - 2 Score 0 0 0  Altered sleeping - - -  Tired, decreased energy - - -  Change in appetite - - -  Feeling bad or failure about yourself  - - -  Trouble concentrating - - -  Moving slowly or fidgety/restless - - -  Suicidal thoughts - - -  PHQ-9 Score - - -  Difficult doing work/chores - - -    Social History   Tobacco Use  . Smoking status: Former Smoker    Types: Cigars    Last attempt to quit: 01/02/2018    Years since quitting: 0.5  . Smokeless tobacco: Former Neurosurgeon  . Tobacco comment: smokes ~10 black and milds per week  Substance Use Topics  . Alcohol use: Yes    Comment: 8-9 beers a month per pt  . Drug use: No    Review of Systems Per HPI unless specifically indicated above     Objective:    BP 126/84   Pulse 88   Temp 98.5 F (36.9 C) (Oral)   Resp 16   Ht 6' (1.829 m)   Wt 299 lb 9.6 oz (135.9 kg)   BMI 40.63 kg/m   Wt Readings from Last 3 Encounters:    07/12/18 299 lb 9.6 oz (135.9 kg)  05/19/18 296 lb 12.8 oz (134.6 kg)  04/20/18 295 lb 8 oz (134 kg)    Physical Exam Vitals signs and nursing note reviewed.  Constitutional:      General: He is not in acute distress.    Appearance: He is well-developed. He is not diaphoretic.     Comments: Well-appearing, comfortable, cooperative  HENT:     Head: Normocephalic and atraumatic.  Eyes:     General:        Right eye: No discharge.        Left eye: No discharge.     Conjunctiva/sclera: Conjunctivae normal.  Cardiovascular:     Rate and Rhythm: Normal rate and regular rhythm.     Pulses: Normal pulses.     Heart sounds: Normal heart sounds. No murmur.  Pulmonary:     Effort: Pulmonary effort is normal.     Breath sounds: Normal breath sounds.  Skin:    General: Skin is warm and dry.     Findings: No erythema or rash.  Neurological:     Mental Status: He is alert and oriented to person, place, and time.  Psychiatric:        Behavior: Behavior normal.  Comments: Well groomed, good eye contact, normal speech and thoughts        Assessment & Plan:   Problem List Items Addressed This Visit    CKD (chronic kidney disease), stage III (HCC)    Persistent CKD-III based on last lab Due for repeat Cr check, will draw today Secondary to HTN DM and other factors      Hyperlipidemia associated with type 2 diabetes mellitus (HCC)    Check Thyroid screening in setting of DM and HLD, obesity      Relevant Orders   TSH   T4, free   Morbid obesity with BMI of 40.0-44.9, adult (HCC)    Weight with slight gain in past 3-4 months Encourage keep improving diet, exercise lifestyle On GLP1 - may increase to 1mg  in future to help wt loss if indicated      Relevant Orders   TSH   T4, free   Type 2 diabetes mellitus with nephropathy (HCC) - Primary    Previously well controlled A1c - due for repeat check last 04/2018 No hyperglycemia or hypoglycemia Lab drawn today Complications  - CKD-III with microalbuminuria, diabetic neuropathy mild, other including hyperlipidemia, morbid obesity, OSA - increases risk of future cardiovascular complications OFF Amaryl, contraindicated metformin due to CHF  Plan:  1. Continue GLP1 Ozempic 0.5mg  weekly [place] injection - no dose increase at this time - pending A1c result, if result is abnormally elevated may reconsider in near future. Encourage improved lifestyle - low carb, low sugar diet, reduce portion size, continue improving regular exercise Check CBG, bring log to next visit for review Continue anticoagulation, Entresto, Statin Again - Advised to schedule DM ophtho exam, send record - handout given again Follow-up q 3-6 months DM      Relevant Orders   Hemoglobin A1c   BASIC METABOLIC PANEL WITH GFR      No orders of the defined types were placed in this encounter.    Follow up plan: Return in about 3 months (around 10/11/2018) for Re-scheduled - Welcome to Medicare.   Saralyn Pilar, DO [facility] 07/12/2018, 9:26 AM

## 2018-07-12 NOTE — Assessment & Plan Note (Signed)
Previously well controlled A1c - due for repeat check last 04/2018 No hyperglycemia or hypoglycemia Lab drawn today Complications - CKD-III with microalbuminuria, diabetic neuropathy mild, other including hyperlipidemia, morbid obesity, OSA - increases risk of future cardiovascular complications OFF Amaryl, contraindicated metformin due to CHF  Plan:  1. Continue GLP1 Ozempic 0.5mg  weekly [place] injection - no dose increase at this time - pending A1c result, if result is abnormally elevated may reconsider in near future. Encourage improved lifestyle - low carb, low sugar diet, reduce portion size, continue improving regular exercise Check CBG, bring log to next visit for review Continue anticoagulation, Entresto, Statin Again - Advised to schedule DM ophtho exam, send record - handout given again Follow-up q 3-6 months DM

## 2018-07-12 NOTE — Assessment & Plan Note (Signed)
Check Thyroid screening in setting of DM and HLD, obesity

## 2018-07-12 NOTE — Assessment & Plan Note (Signed)
Weight with slight gain in past 3-4 months Encourage keep improving diet, exercise lifestyle On GLP1 - may increase to 1mg  in future to help wt loss if indicated

## 2018-07-12 NOTE — Patient Instructions (Addendum)
Thank you for coming to the office today.  Today labs drawn - stay tuned for results, for sugar, A1c, kidney and thyroid function.  Recent Labs    01/02/18 0301 04/13/18 1606  HGBA1C 6.4* 6.1*    Your provider would like to you have your annual eye exam. Please contact your current eye doctor or here are some good options for you to contact.   [facility]   Address: [address] [place], [place] [address] Phone: [phone]  Website: [email or web address]   [facility] [address], [place], [place] [address] Phone: [phone] [email or web address]  [facility]  Address: [address] [place], [place], [place] [address] Phone: [phone]   [facility] [address] [place], [place] [address] Phone: [phone]  [facility] Address: [address] [place], [place], [place] [address]  Phone: [phone]   Please schedule a Follow-up Appointment to: Return in about 3 months (around 10/11/2018) for Re-scheduled - Welcome to Medicare.  If you have any other questions or concerns, please feel free to call the office or send a message through MyChart. You may also schedule an earlier appointment if necessary.  Additionally, you may be receiving a survey about your experience at our office within a few days to 1 week by e-mail or mail. We value your feedback.  Saralyn Pilar, DO [facility], [place]

## 2018-07-13 ENCOUNTER — Other Ambulatory Visit: Payer: Self-pay | Admitting: Family Medicine

## 2018-07-13 DIAGNOSIS — I5022 Chronic systolic (congestive) heart failure: Secondary | ICD-10-CM

## 2018-07-13 LAB — BASIC METABOLIC PANEL WITH GFR
BUN / CREAT RATIO: 11 (calc) (ref 6–22)
BUN: 17 mg/dL (ref 7–25)
CO2: 29 mmol/L (ref 20–32)
Calcium: 9.2 mg/dL (ref 8.6–10.3)
Chloride: 103 mmol/L (ref 98–110)
Creat: 1.49 mg/dL — ABNORMAL HIGH (ref 0.60–1.35)
GFR, EST AFRICAN AMERICAN: 63 mL/min/{1.73_m2} (ref >=60)
GFR, Est Non African American: 54 mL/min/{1.73_m2} — ABNORMAL LOW (ref >=60)
Glucose, Bld: 112 mg/dL — ABNORMAL HIGH (ref 65–99)
Potassium: 3.7 mmol/L (ref 3.5–5.3)
Sodium: 141 mmol/L (ref 135–146)

## 2018-07-13 LAB — T4, FREE: FREE T4: 1.3 ng/dL (ref 0.8–1.8)

## 2018-07-13 LAB — HEMOGLOBIN A1C
Hgb A1c MFr Bld: 6.3 % of total Hgb — ABNORMAL HIGH (ref <5.7)
Mean Plasma Glucose: 134 (calc)
eAG (mmol/L): 7.4 (calc)

## 2018-07-13 LAB — TSH: TSH: 2 mIU/L (ref 0.40–4.50)

## 2018-07-13 MED ORDER — SPIRONOLACTONE 25 MG PO TABS: 12.5000 mg | ORAL_TABLET | Freq: Every day | ORAL | 3 refills | Status: DC

## 2018-07-13 NOTE — Telephone Encounter (Signed)
Pt needs a refill on spironolactone sent to [facility]

## 2018-07-13 NOTE — Addendum Note (Signed)
Addended by: Smitty Cords on: 07/13/2018 10:54 AM   Modules accepted: Orders

## 2018-07-23 ENCOUNTER — Telehealth: Payer: Self-pay | Admitting: Family Medicine

## 2018-07-23 ENCOUNTER — Other Ambulatory Visit: Payer: Self-pay | Admitting: Cardiovascular Disease

## 2018-07-23 MED ORDER — APIXABAN 5 MG PO TABS: 5.0000 mg | ORAL_TABLET | Freq: Two times a day (BID) | ORAL | 6 refills | Status: DC

## 2018-07-23 NOTE — Telephone Encounter (Signed)
Pt is a 48 yr old male who last saw Dr. Graciela Husbands on 03/02/18. Last SCr on 07/15/18 was 1.49 and weight was 135.9Kg. Will refill Eliquis 5mg  BID.

## 2018-07-23 NOTE — Telephone Encounter (Signed)
Cardiology refilled medication today.

## 2018-07-23 NOTE — Telephone Encounter (Signed)
Pt needs a refill on eliquis sent to [facility] [phone]

## 2018-07-23 NOTE — Telephone Encounter (Signed)
Please advise 

## 2018-07-23 NOTE — Telephone Encounter (Signed)
°*  STAT* If patient is at the pharmacy, call can be transferred to refill team.   1. Which medications need to be refilled? (please list name of each medication and dose if known)  Eliquis 5 mg po BID   2. Which pharmacy/location (including street and city if local pharmacy) is medication to be sent to? [facility] [address] [place]    3. Do they need a 30 day or 90 day supply? 90  Patient has been out for 3 days and says it requires Prior auth .  Can he have samples ?

## 2018-07-25 LAB — CUP PACEART REMOTE DEVICE CHECK
Battery Remaining Longevity: 129 mo
Brady Statistic RV Percent Paced: 0.01 %
Date Time Interrogation Session: 20191219062404
HighPow Impedance: 75 Ohm
Implantable Lead Location: 753860
Implantable Lead Model: 293
Implantable Lead Serial Number: 433424
Implantable Pulse Generator Implant Date: 20180604
Lead Channel Impedance Value: 342 Ohm
Lead Channel Impedance Value: 361 Ohm
Lead Channel Pacing Threshold Amplitude: 1.75 V
Lead Channel Pacing Threshold Pulse Width: 0.4 ms
Lead Channel Sensing Intrinsic Amplitude: 11.25 mV
Lead Channel Sensing Intrinsic Amplitude: 11.25 mV
Lead Channel Setting Pacing Pulse Width: 0.8 ms
MDC IDC LEAD IMPLANT DT: 20180604
MDC IDC MSMT BATTERY VOLTAGE: 3.02 V
MDC IDC SET LEADCHNL RV PACING AMPLITUDE: 3.5 V
MDC IDC SET LEADCHNL RV SENSING SENSITIVITY: 0.3 mV

## 2018-08-12 ENCOUNTER — Ambulatory Visit: Payer: Medicare Other | Admitting: Physician Assistant

## 2018-08-13 ENCOUNTER — Ambulatory Visit: Payer: Medicare Other | Admitting: Adult Health

## 2018-08-17 NOTE — Progress Notes (Deleted)
Cardiology Office Note Date:  08/17/2018  Patient ID:  Kent GeraldsWallace L Petro Jr., DOB 05/14/1971, MRN [id number] PCP:  Smitty CordsKaramalegos, Alexander J, DO  Cardiologist:  Dr. Kirke CorinArida, MD Electrophysiologist: Dr. Graciela HusbandsKlein, MD  ***refresh   Chief Complaint: Follow-up  History of Present Illness: Kent GeraldsWallace L Alessio Jr. is a 48 y.o. male with history of chronic systolic CHF secondary to nonischemic cardiomyopathy status post ICD implantation in 11/2016 for primary prevention of, PAF diagnosed on device interrogation in 12/2017 now on Eliquis, NSVT, seizure disorder since age 48, prior stroke in 2017 with recurrent stroke in 12/2017 previously on Plavix now on Eliquis as above, CKD stage II, morbid obesity with severe sleep apnea on CPAP, prior polysubstance abuse with tobacco abuse quitting in 2014, marijuana and cocaine abuse, diabetes, hypertension, and hyperlipidemia who presents for follow-up of his nonischemic cardiomyopathy and A. fib.  He was diagnosed with systolic CHF in 2014 with an initial EF of less than 20%.  Cardiac cath from 09/2014 showed minor luminal irregularities without evidence of obstructive coronary artery disease.  LVEDP was 30 mmHg.  He was recently admitted to the hospital in 05/2018 with atypical chest pain, palpitations, shortness of breath, and lightheadedness.  He was noted to have a slightly elevated troponin that was felt to reflect supply demand mismatch in the setting of chronic systolic CHF and renal insufficiency.  Echo during that admission continued to show a cardiomyopathy with an EF of 10 to 15%, diffuse hypokinesis, mildly dilated aortic root, RV cavity size was normal with normal RV systolic function.  His ICD was interrogated with no evidence of further A. fib.  No inpatient ischemic evaluation was recommended and his medications were optimized.  Most recent device interrogation from 06/2018 was normal and reviewed by EP.  Labs: 07/2018 - TSH normal, A1c 6.3, serum creatinine  1.49, potassium 3.7 05/2018 - CBC unremarkable 12/2017 - LDL 109  ***   Past Medical History:  Diagnosis Date  . CHF (congestive heart failure) (HCC)   . Chronic systolic heart failure (HCC)    a. 10/2012 Echo: EF <20%, mod to sev dil LV, diast dysfxn, mildly dil LA/RA, mod to severe MR/TR.  [name]. Coronary artery disease, non-occlusive    a. 09/2014 Cath: minor luminal irregularities with severely dilated left ventricle. LVEDP 30.  [name]. H/O noncompliance with medical treatment, presenting hazards to health   . History of traumatic injury of head   . Hyperlipidemia   . Hypertension   . Morbid obesity (HCC)   . NICM (nonischemic cardiomyopathy) (HCC)    a. 10/2012 Echo: EF <20%.  . Polysubstance abuse (HCC)    a. cocaine, tobacco, THC, ETOH  . Seizure disorder (HCC)   . Stroke [facility](HCC)     Past Surgical History:  Procedure Laterality Date  . CARDIAC CATHETERIZATION     [facility]  . ICD IMPLANT N/A 12/08/2016   Procedure: ICD Implant;  Surgeon: Duke SalviaKlein, Steven C, MD;  [facility];  Service: Cardiovascular;  Laterality: N/A;    No outpatient medications have been marked as taking for the 08/19/18 encounter (Appointment) with Sondra Bargesunn, Tyrese Capriotti M, PA-C.    Allergies:   Tomato   Social History:  The patient  reports that he quit smoking about 7 months ago. His smoking use included cigars. He has quit using smokeless tobacco. He reports current alcohol use. He reports that he does not use drugs.   Family History:  The patient's family history includes Brain cancer in his mother;  Diabetes in his father and mother; Heart attack in his father and mother; Heart disease in his father; Heart failure in his father, maternal aunt, and mother; Hyperlipidemia in his father; Hypertension in his father; Sleep apnea in his brother, father, mother, and sister.  ROS:   ROS   PHYSICAL EXAM: *** VS:  There were no vitals taken for this visit. BMI: There is no height or weight on file to calculate  BMI.  Physical Exam   EKG:  Was ordered and interpreted by me today. Shows ***  Recent Labs: 05/18/2018: ALT 23; Magnesium 1.9 05/19/2018: Hemoglobin 14.2; Platelets 196 07/12/2018: BUN 17; Creat 1.49; Potassium 3.7; Sodium 141; TSH 2.00  01/02/2018: Cholesterol 163; HDL 33; LDL Cholesterol 109; Total CHOL/HDL Ratio 4.9; Triglycerides 103; VLDL 21   CrCl cannot be calculated (Patient's most recent lab result is older than the maximum 21 days allowed.).   Wt Readings from Last 3 Encounters:  07/12/18 299 lb 9.6 oz (135.9 kg)  05/19/18 296 lb 12.8 oz (134.6 kg)  04/20/18 295 lb 8 oz (134 kg)     Other studies reviewed: Additional studies/records reviewed today include: summarized above  ASSESSMENT AND PLAN:  1. ***  Disposition: F/u with Dr. Kirke Corin or an APP in *** and EP as directed.   Current medicines are reviewed at length with the patient today.  The patient did not have any concerns regarding medicines.  Signed, Eula Listen, PA-C 08/17/2018 7:15 AM     [facility] [address] [place], [place] [address] [phone]

## 2018-08-19 ENCOUNTER — Ambulatory Visit: Payer: Medicare Other | Admitting: Physician Assistant

## 2018-08-19 DIAGNOSIS — R0989 Other specified symptoms and signs involving the circulatory and respiratory systems: Secondary | ICD-10-CM

## 2018-08-20 ENCOUNTER — Encounter: Payer: Self-pay | Admitting: Physician Assistant

## 2018-08-25 IMAGING — CT CT HEAD W/O CM
4 series · 16 of 47 positions shown, 18 images · non-contrast
Comparison: Head CT from yesterday

CLINICAL DATA: Follow-up recent stroke.  No worsening symptoms

EXAM:
CT HEAD WITHOUT CONTRAST
TECHNIQUE: Contiguous axial images were obtained from the base of the skull
through the vertex without intravenous contrast.

[Series 3: head wo · axial · 0.48mm/px · z∈[-122,-6]mm · 7 of 31 slices shown, 9 images]
[im 4/31  brain]
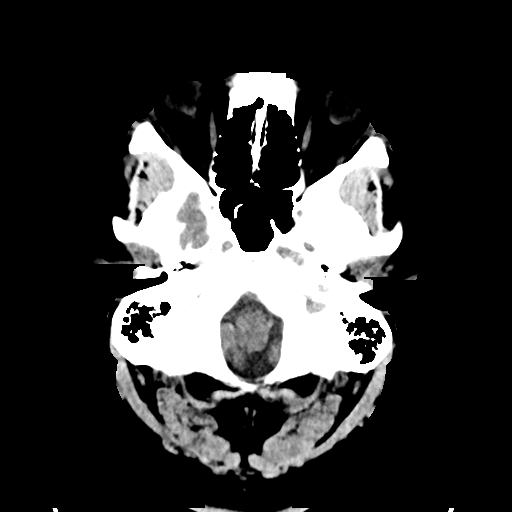
[im 4/31  bone]
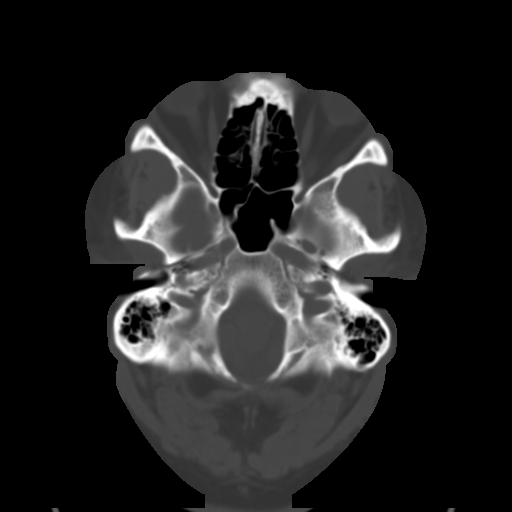
[im 8/31  brain]
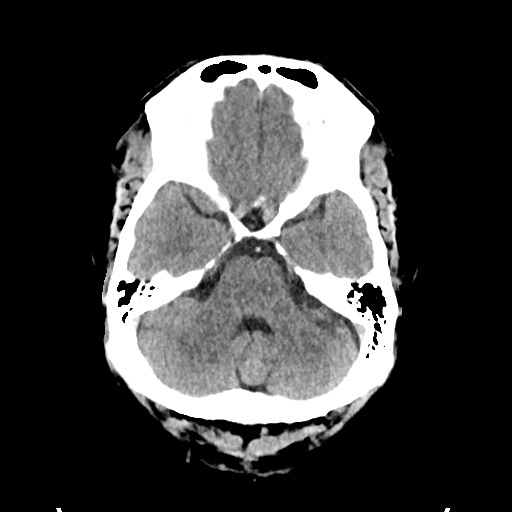
[im 12/31  brain]
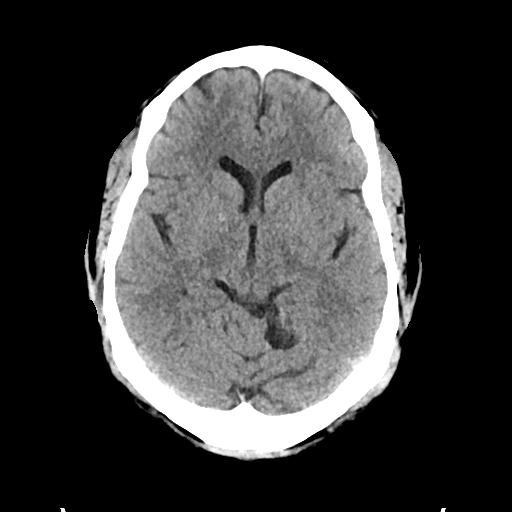
[im 16/31  brain]
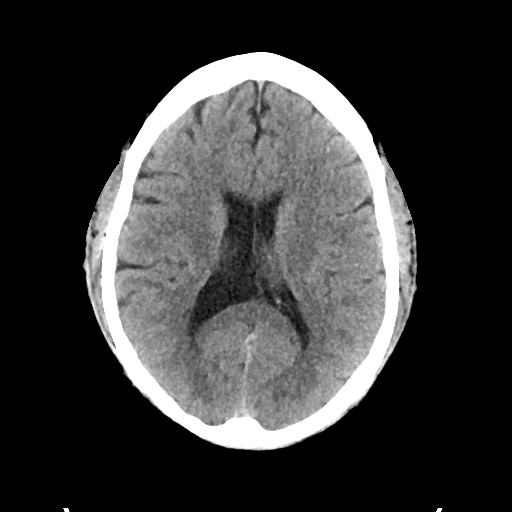
[im 19/31  brain]
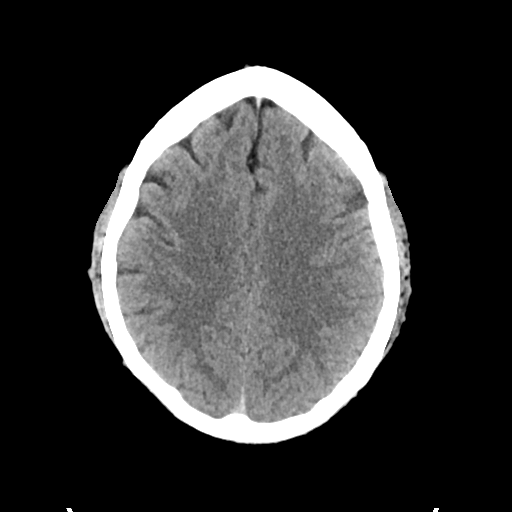
[im 19/31  bone]
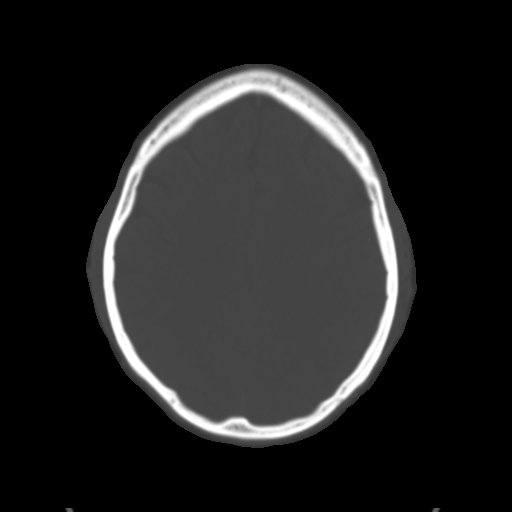
[im 23/31  brain]
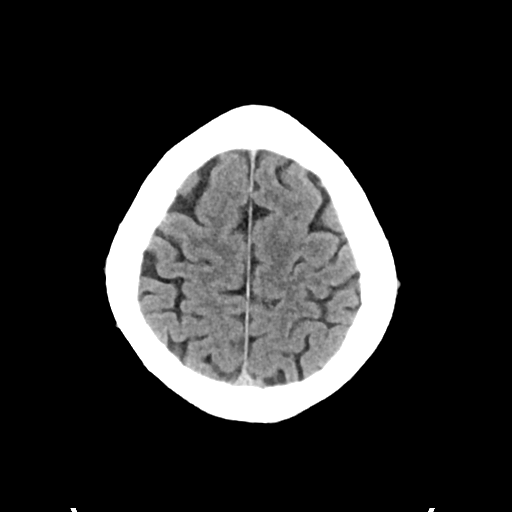
[im 27/31  brain]
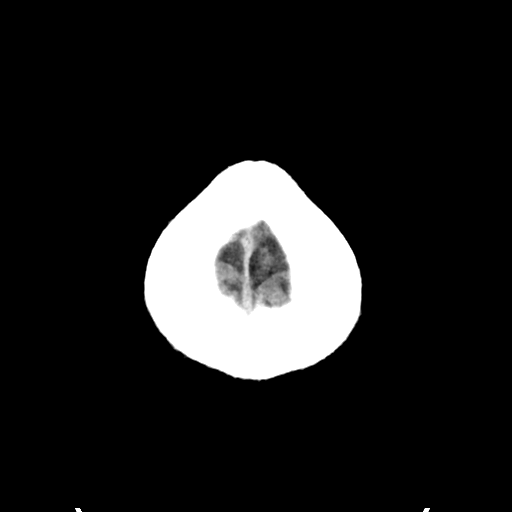

[Series 4: head bone · axial · 0.48mm/px · z∈[-122,-90]mm · 3 of 78 slices shown]
[im 8/78  bone]
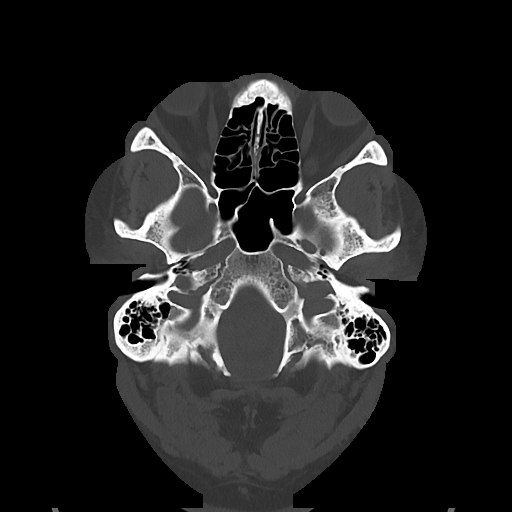
[im 16/78  bone]
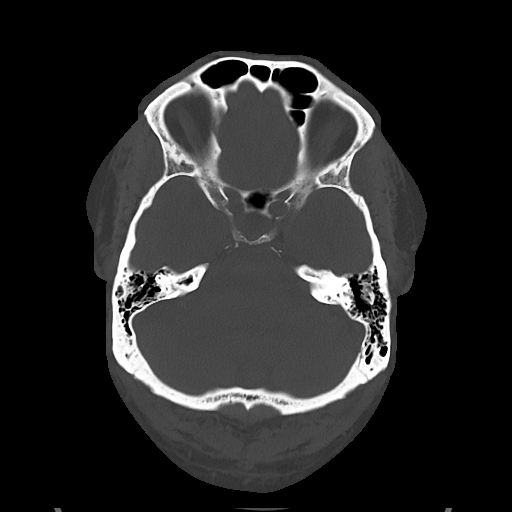
[im 24/78  bone]
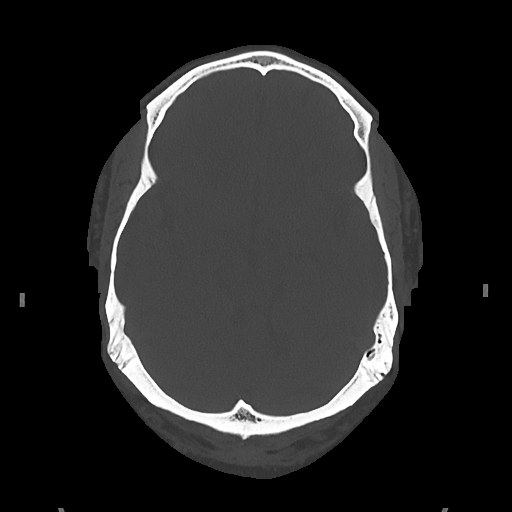

[Series 5: cor soft · coronal · 0.33mm/px · 3 of 68 slices shown]
[im 23/68  brain]
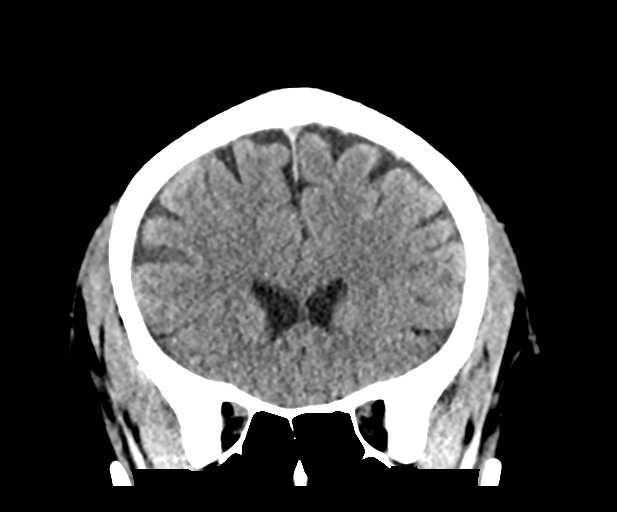
[im 30/68  brain]
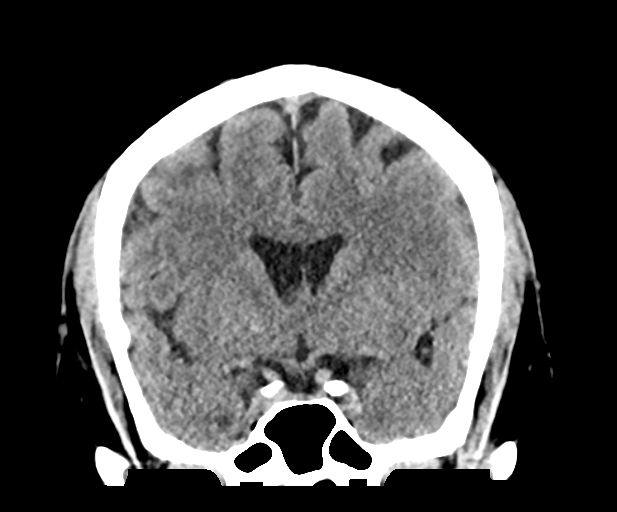
[im 38/68  brain]
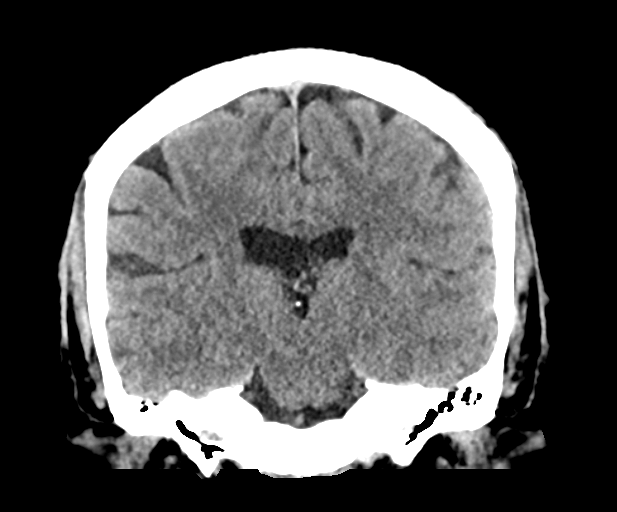

[Series 6: sag soft · sagittal · 0.33mm/px · 3 of 55 slices shown]
[im 19/55  brain]
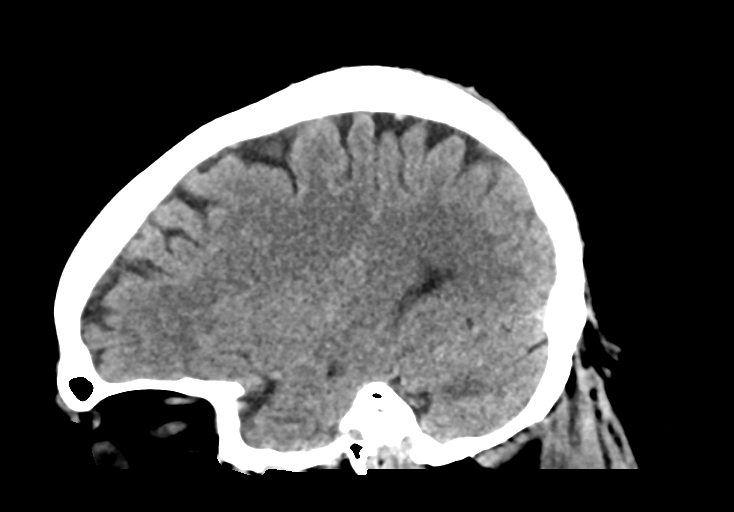
[im 28/55  brain]
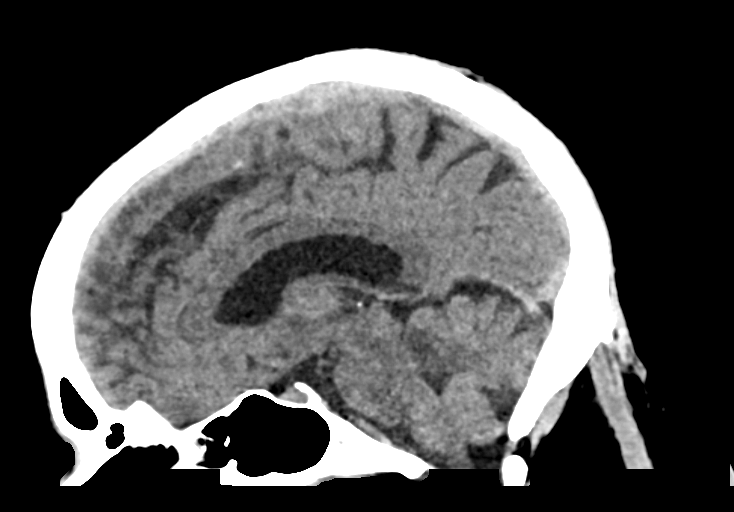
[im 37/55  brain]
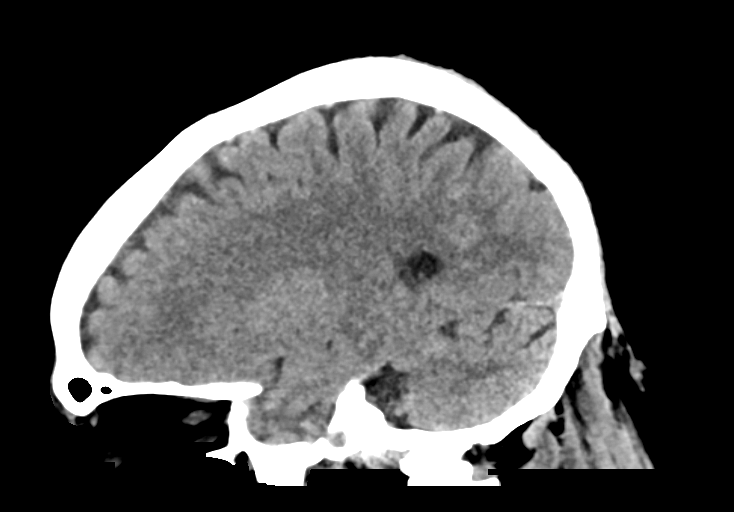

[16 of 47 positions shown; findings below may reference images not displayed]

FINDINGS: Brain: Stable low-density in the upper right cerebellum that has
developed since more remote imaging. Chronic left superior
cerebellar infarct. Negative cerebral cortex and deep gray nuclei.
No hemorrhage, hydrocephalus, or masslike finding

Vascular: No hyperdense vessel or unexpected calcification.

Skull: Normal. Negative for fracture or focal lesion.

Sinuses/Orbits: No acute finding.
IMPRESSION: 1. Stable compared yesterday.
2. Small recent right superior cerebellar infarct.
3. Remote left superior cerebellar infarct.

## 2018-09-20 ENCOUNTER — Ambulatory Visit: Payer: Medicare Other | Admitting: Family Medicine

## 2018-09-22 ENCOUNTER — Encounter: Payer: Self-pay | Admitting: Family Medicine

## 2018-09-22 ENCOUNTER — Ambulatory Visit (INDEPENDENT_AMBULATORY_CARE_PROVIDER_SITE_OTHER): Payer: Medicare Other | Admitting: Family Medicine

## 2018-09-22 ENCOUNTER — Other Ambulatory Visit: Payer: Self-pay

## 2018-09-22 VITALS — BP 119/84 | HR 85 | Temp 97.7°F | Resp 16 | Ht 72.0 in | Wt 290.0 lb

## 2018-09-22 DIAGNOSIS — Z8673 Personal history of transient ischemic attack (TIA), and cerebral infarction without residual deficits: Secondary | ICD-10-CM

## 2018-09-22 DIAGNOSIS — M6289 Other specified disorders of muscle: Secondary | ICD-10-CM | POA: Diagnosis not present

## 2018-09-22 NOTE — Progress Notes (Signed)
Subjective:    Patient ID: Kent Stanley., male    DOB: 1971-06-24, 48 y.o.   MRN: [id number]  Kent Stanley. is a 48 y.o. male presenting on 09/22/2018 for Muscle weakness, history of stroke  Re-scheduled from Welcome to medicare visit to an office visit due to medical complaint.  HPI   History of Cerebellar Stroke with residual deficit, muscle weakness vs fatigability Reviewed prior history of cerebellar stroke from 12/2017. See prior notes, he has significantly improved near baseline. 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 He has been able to work up to 8 hours without difficulty but worsening symptoms if working prolong shift 11 to 12 hours straight - He is on medical disability - Admits soreness and muscle weakness in lower extremity only after prolonged standing shift at work - Denies dizziness, shortness of breath, lightheadedness   Depression screen [facility] 2/9 09/22/2018 09/22/2018 07/12/2018  Decreased Interest 0 0 0  Down, Depressed, Hopeless 0 0 0  PHQ - 2 Score 0 0 0  Altered sleeping - - -  Tired, decreased energy - - -  Change in appetite - - -  Feeling bad or failure about yourself  - - -  Trouble concentrating - - -  Moving slowly or fidgety/restless - - -  Suicidal thoughts - - -  PHQ-9 Score - - -  Difficult doing work/chores - - -    Social History   Tobacco Use  . Smoking status: Former Smoker    Types: Cigars    Last attempt to quit: 01/02/2018    Years since quitting: 0.7  . Smokeless tobacco: Former Neurosurgeon  . Tobacco comment: smokes ~10 black and milds per week  Substance Use Topics  . Alcohol use: Yes    Comment:  8-9 beers a month per pt  . Drug use: No    Review of Systems Per HPI unless specifically indicated above     Objective:    BP 119/84   Pulse 85   Temp 97.7 F (36.5 C) (Oral)   Resp 16   Ht 6' (1.829 m)   Wt 290 lb (131.5 kg)   BMI 39.33 kg/m   Wt Readings from Last 3 Encounters:  09/22/18 290 lb (131.5 kg)  07/12/18 299 lb 9.6 oz (135.9 kg)  05/19/18 296 lb 12.8 oz (134.6 kg)    Physical Exam Vitals signs and nursing note reviewed.  Constitutional:      General: He is not in acute distress.    Appearance: He is well-developed. He is not diaphoretic.     Comments: Well-appearing, comfortable, cooperative, obese  HENT:     Head: Normocephalic and atraumatic.  Eyes:     General:        Right eye: No discharge.        Left eye: No discharge.     Conjunctiva/sclera: Conjunctivae normal.  Neck:     Musculoskeletal: Normal range of motion and neck supple.     Thyroid: No thyromegaly.  Cardiovascular:     Rate and Rhythm: Normal rate and regular rhythm.     Heart sounds: Normal heart sounds. No murmur.  Pulmonary:     Effort: Pulmonary effort is normal. No respiratory distress.     Breath sounds: Normal breath sounds. No wheezing or rales.  Musculoskeletal: Normal range of motion.  Lymphadenopathy:     Cervical: No cervical adenopathy.  Skin:    General: Skin is warm and dry.     Findings: No erythema or rash.  Neurological:     Mental Status: He is alert and oriented to person, place, and time.  Psychiatric:        Behavior: Behavior normal.     Comments: Well groomed, good eye contact, normal speech and thoughts    Results for orders placed or performed in visit on 07/12/18  TSH  Result Value Ref Range   TSH 2.00 0.40 - 4.50 mIU/L  T4, free  Result Value Ref Range   Free T4 1.3 0.8 - 1.8 ng/dL  Hemoglobin F0H  Result Value Ref Range   Hgb A1c MFr Bld 6.3 (H) <5.7 % of total Hgb   Mean Plasma Glucose 134 (calc)   eAG (mmol/L) 7.4 (calc)  BASIC  METABOLIC PANEL WITH GFR  Result Value Ref Range   Glucose, Bld 112 (H) 65 - 99 mg/dL   BUN 17 7 - 25 mg/dL   Creat 2.25 (H) 7.50 - 1.35 mg/dL   GFR, Est Non African American 54 (L) > OR = 60 mL/min/1.17m2   GFR, Est African American 63 > OR = 60 mL/min/1.60m2   BUN/Creatinine Ratio 11 6 - 22 (calc)   Sodium 141 135 - 146 mmol/L   Potassium 3.7 3.5 - 5.3 mmol/L   Chloride 103 98 - 110 mmol/L   CO2 29 20 - 32 mmol/L   Calcium 9.2 8.6 - 10.3 mg/dL      Assessment & Plan:   Problem List Items Addressed This Visit    History of stroke involving cerebellum - Primary    Other Visit Diagnoses    Muscle fatigue          Clinically with mostly resolved residual deficits from cerebellar stroke from 12/2017 - with some residual persistent complication of having increased muscle fatigability and weakness worsening with prolonged standing >8 hours limiting his function  Followed by Cardiology and Neurology, on med management  Plan Discussion of his chronic limitations from prior CVA Note written today for work restriction limitation, should only work up to 8 hour shift due to his physical limitation residual deficit after stroke with overuse   No orders of the defined types were placed in this encounter.   Follow up plan: Return in about 3 months (around 12/23/2018) for Yearly Medicare Checkup.  Future labs ordered for 12/2018  Saralyn Pilar, DO [facility] 09/22/2018, 8:41 AM

## 2018-09-22 NOTE — Patient Instructions (Addendum)
Thank you for coming to the office today.  Note written for work - to add some restrictions for now - let me know if needs to be advised or updated.  Your provider would like to you have your annual eye exam. Please contact your current eye doctor or here are some good options for you to contact.   [facility]   Address: [address] [place], [place] [address] Phone: [phone]  Website: [email or web address]   [facility] [address], [place], [place] [address] Phone: [phone] [email or web address]  [facility]  Address: [address] [place], [place], [place] [address] Phone: [phone]   [facility] [address] [place], [place] [address] Phone: [phone]  [facility] Address: [address] [place], [place], [place] [address]  Phone: [phone]  DUE for FASTING BLOOD WORK (no food or drink after midnight before the lab appointment, only water or coffee without cream/sugar on the morning of)  SCHEDULE "Lab Only" visit in the morning at the clinic for lab draw in 3-4 MONTHS   - Make sure Lab Only appointment is at about 1 week before your next appointment, so that results will be available  For Lab Results, once available within 2-3 days of blood draw, you can can log in to MyChart online to view your results and a brief explanation. Also, we can discuss results at next follow-up visit.   Please schedule a Follow-up Appointment to: Return in about 3 months (around 12/23/2018) for Yearly Medicare Checkup.  If you have any other questions or concerns, please feel free to call the office or send a message through MyChart. You may also schedule an earlier appointment if necessary.  Additionally, you may be receiving a survey about your experience at our office within a few days to 1 week by e-mail or mail. We value your feedback.  Saralyn Pilar, DO [facility], [place]

## 2018-09-23 ENCOUNTER — Ambulatory Visit (INDEPENDENT_AMBULATORY_CARE_PROVIDER_SITE_OTHER): Payer: Medicare Other | Admitting: *Deleted

## 2018-09-23 ENCOUNTER — Telehealth: Payer: Self-pay | Admitting: Family Medicine

## 2018-09-23 ENCOUNTER — Other Ambulatory Visit: Payer: Self-pay | Admitting: Family Medicine

## 2018-09-23 DIAGNOSIS — Z6841 Body Mass Index (BMI) 40.0 and over, adult: Secondary | ICD-10-CM

## 2018-09-23 DIAGNOSIS — E1121 Type 2 diabetes mellitus with diabetic nephropathy: Secondary | ICD-10-CM

## 2018-09-23 DIAGNOSIS — I480 Paroxysmal atrial fibrillation: Secondary | ICD-10-CM

## 2018-09-23 DIAGNOSIS — I5022 Chronic systolic (congestive) heart failure: Secondary | ICD-10-CM

## 2018-09-23 DIAGNOSIS — Z8673 Personal history of transient ischemic attack (TIA), and cerebral infarction without residual deficits: Secondary | ICD-10-CM

## 2018-09-23 DIAGNOSIS — I428 Other cardiomyopathies: Secondary | ICD-10-CM

## 2018-09-23 DIAGNOSIS — E1169 Type 2 diabetes mellitus with other specified complication: Secondary | ICD-10-CM

## 2018-09-23 DIAGNOSIS — E785 Hyperlipidemia, unspecified: Principal | ICD-10-CM

## 2018-09-23 DIAGNOSIS — R351 Nocturia: Secondary | ICD-10-CM

## 2018-09-23 LAB — CUP PACEART REMOTE DEVICE CHECK
Battery Remaining Longevity: 127 mo
Battery Voltage: 3.02 V
Brady Statistic RV Percent Paced: 0.01 %
Date Time Interrogation Session: 20200319052403
HighPow Impedance: 74 Ohm
Implantable Lead Implant Date: 20180604
Implantable Lead Location: 753860
Implantable Lead Model: 293
Implantable Lead Serial Number: 433424
Implantable Pulse Generator Implant Date: 20180604
Lead Channel Impedance Value: 342 Ohm
Lead Channel Impedance Value: 342 Ohm
Lead Channel Pacing Threshold Amplitude: 2 V
Lead Channel Pacing Threshold Pulse Width: 0.4 ms
Lead Channel Sensing Intrinsic Amplitude: 12.75 mV
Lead Channel Sensing Intrinsic Amplitude: 12.75 mV
Lead Channel Setting Pacing Amplitude: 3.5 V
Lead Channel Setting Sensing Sensitivity: 0.3 mV
MDC IDC SET LEADCHNL RV PACING PULSEWIDTH: 0.8 ms

## 2018-09-23 NOTE — Telephone Encounter (Signed)
-----   Message from Lavinia Sharps, CMA sent at 09/22/2018  3:43 PM EDT ----- Regarding: medicare wellness appointment Can you help [place] schedule medicare wellness for this patient. Does he need one ?

## 2018-09-23 NOTE — Telephone Encounter (Signed)
Called to schedule Medicare Annual Wellness Visit with Nurse Health Advisor.   If patient returns call, please schedule AWV with [facility] any date after July 1st 2020  For any questions please contact:  Manuela Schwartz  [phone] or skype at: [email or web address]

## 2018-09-28 ENCOUNTER — Ambulatory Visit: Payer: Medicare Other | Admitting: Family Medicine

## 2018-10-01 ENCOUNTER — Other Ambulatory Visit: Payer: Self-pay | Admitting: Family Medicine

## 2018-10-01 DIAGNOSIS — E1121 Type 2 diabetes mellitus with diabetic nephropathy: Secondary | ICD-10-CM

## 2018-10-01 MED ORDER — SEMAGLUTIDE(0.25 OR 0.5MG/DOS) 2 MG/1.5ML ~~LOC~~ SOPN: 0.5000 mg | PEN_INJECTOR | SUBCUTANEOUS | 5 refills | Status: DC

## 2018-10-01 NOTE — Progress Notes (Signed)
Remote ICD transmission.   

## 2018-10-04 ENCOUNTER — Other Ambulatory Visit: Payer: Self-pay | Admitting: Family Medicine

## 2018-10-04 NOTE — Telephone Encounter (Signed)
The pt was notified that he already had refills and he needs to contact his pharmacy when he need refills.

## 2018-10-04 NOTE — Telephone Encounter (Signed)
Pt  Called requesting refill on Ozempic

## 2018-10-12 ENCOUNTER — Other Ambulatory Visit: Payer: Self-pay | Admitting: Family Medicine

## 2018-10-12 DIAGNOSIS — I5022 Chronic systolic (congestive) heart failure: Secondary | ICD-10-CM

## 2018-10-12 DIAGNOSIS — I480 Paroxysmal atrial fibrillation: Secondary | ICD-10-CM

## 2018-10-12 MED ORDER — METOPROLOL SUCCINATE ER 25 MG PO TB24: 37.5000 mg | ORAL_TABLET | Freq: Every day | ORAL | 2 refills | Status: DC

## 2018-10-13 ENCOUNTER — Telehealth: Payer: Self-pay

## 2018-10-13 NOTE — Telephone Encounter (Signed)
TELEPHONE CALL NOTE  Kent Stanley. has been deemed a candidate for a follow-up tele-health visit to limit community exposure during the Covid-19 pandemic. I spoke with the patient via phone to ensure availability of phone/video source, confirm preferred email & phone number, discuss instructions and expectations, and review consent.   I reminded Kent Stanley. to be prepared with any vital sign and/or heart rhythm information that could potentially be obtained via home monitoring, at the time of his visit.  Finally, I reminded Kent Stanley. to expect an e-mail containing a link for their video-based visit approximately 15 minutes before his visit, or alternatively, a phone call at the time of his visit if his visit is planned to be a phone encounter.  Did the patient verbally consent to treatment as below? YES  ,  L, CMA 10/13/2018 3:01 PM  CONSENT FOR TELE-HEALTH VISIT - PLEASE REVIEW  I hereby voluntarily request, consent and authorize [facility] and its employed or contracted physicians, physician assistants, nurse practitioners or other licensed health care professionals (the Practitioner), to provide me with telemedicine health care services (the "Services") as deemed necessary by the treating Practitioner. I acknowledge and consent to receive the Services by the Practitioner via telemedicine. I understand that the telemedicine visit will involve communicating with the Practitioner through telephonic communication technology and the disclosure of certain medical information by electronic transmission. I acknowledge that I have been given the opportunity to request an in-person assessment or other available alternative prior to the telemedicine visit and am voluntarily participating in the telemedicine visit.  I understand that I have the right to withhold or withdraw my consent to the use of telemedicine in the course of my care at any time,  without affecting my right to future care or treatment, and that the Practitioner or I may terminate the telemedicine visit at any time. I understand that I have the right to inspect all information obtained and/or recorded in the course of the telemedicine visit and may receive copies of available information for a reasonable fee.  I understand that some of the potential risks of receiving the Services via telemedicine include:  [name] Delay or interruption in medical evaluation due to technological equipment failure or disruption; . Information transmitted may not be sufficient (e.g. poor resolution of images) to allow for appropriate medical decision making by the Practitioner; and/or  . In rare instances, security protocols could fail, causing a breach of personal health information.  Furthermore, I acknowledge that it is my responsibility to provide information about my medical history, conditions and care that is complete and accurate to the best of my ability. I acknowledge that Practitioner's advice, recommendations, and/or decision may be based on factors not within their control, such as incomplete or inaccurate data provided by me or lack of visual representation. I understand that the practice of medicine is not an exact science and that Practitioner makes no warranties or guarantees regarding treatment outcomes. I acknowledge that I will receive a copy of this consent concurrently upon execution via email to the email address I last provided but may also request a printed copy by calling the office of [facility].    I understand that my insurance may be billed for this visit.   I have read or had this consent read to me. . I understand the contents of this consent, which adequately explains the benefits and risks of the Services being provided via  telemedicine.  . I have been provided ample opportunity to ask questions regarding this consent and the Services and have had my questions  answered to my satisfaction. . I give my informed consent for the services to be provided through the use of telemedicine in my medical care  By participating in this telemedicine visit I agree to the above.

## 2018-10-13 NOTE — Telephone Encounter (Signed)
   TELEPHONE CALL NOTE  This patient has been deemed a candidate for follow-up tele-health visit to limit community exposure during the Covid-19 pandemic. I spoke with the patient via phone to discuss instructions. The patient was advised to review the section on consent for treatment as well. The patient will receive a phone call 2-3 days prior to their E-Visit at which time consent will be verbally confirmed. A Virtual Office Visit appointment type has been scheduled for 10/18/2018  with [name] FNP.  Eleftheria Taborn L, CMA 10/13/2018 3:00 PM

## 2018-10-18 ENCOUNTER — Encounter: Payer: Self-pay | Admitting: Family

## 2018-10-18 ENCOUNTER — Other Ambulatory Visit: Payer: Self-pay

## 2018-10-18 ENCOUNTER — Ambulatory Visit: Payer: Medicare Other | Attending: Family | Admitting: Family

## 2018-10-18 VITALS — BP 124/85 | HR 75 | Wt 289.0 lb

## 2018-10-18 DIAGNOSIS — I5022 Chronic systolic (congestive) heart failure: Secondary | ICD-10-CM

## 2018-10-18 DIAGNOSIS — I480 Paroxysmal atrial fibrillation: Secondary | ICD-10-CM

## 2018-10-18 DIAGNOSIS — E1121 Type 2 diabetes mellitus with diabetic nephropathy: Secondary | ICD-10-CM

## 2018-10-18 DIAGNOSIS — E782 Mixed hyperlipidemia: Secondary | ICD-10-CM

## 2018-10-18 DIAGNOSIS — I10 Essential (primary) hypertension: Secondary | ICD-10-CM

## 2018-10-18 MED ORDER — PRAVASTATIN SODIUM 40 MG PO TABS: 80.0000 mg | ORAL_TABLET | Freq: Every day | ORAL | 3 refills | Status: DC

## 2018-10-18 MED ORDER — METOPROLOL SUCCINATE ER 25 MG PO TB24: 25.0000 mg | ORAL_TABLET | Freq: Every day | ORAL | 3 refills | Status: DC

## 2018-10-18 NOTE — Patient Instructions (Signed)
Resume weighing daily and call for an overnight weight gain of > 2 pounds or a weekly weight gain of >5 pounds. 

## 2018-10-18 NOTE — Progress Notes (Signed)
Virtual Visit via Telephone Note    Evaluation Performed:  Follow-up visit  This visit type was conducted due to national recommendations for restrictions regarding the COVID-19 Pandemic (e.g. social distancing).  This format is felt to be most appropriate for this patient at this time.  All issues noted in this document were discussed and addressed.  No physical exam was performed (except for noted visual exam findings with Video Visits).  Please refer to the patient's chart (MyChart message for video visits and phone note for telephone visits) for the patient's consent to telehealth for [facility]  Date:  10/18/2018   ID:  Kent Kindred., DOB [birth year], MRN [id number]  Patient Location:  [address] [place] [address]   Provider location:   [facility] [place] [address], [place] [address]  PCP:  Olin Hauser, DO  Cardiologist:  Kathlyn Sacramento, MD Electrophysiologist:  None   Chief Complaint:  fatigue  History of Present Illness:    Kent Helm. is a 48 y.o. male who presents via audio/video conferencing for a telehealth visit today.  Patient verified DOB and address.  The patient does not have symptoms concerning for COVID-19 infection (fever, chills, cough, or new SHORTNESS OF BREATH).   He complains of minimal fatigue upon moderate exertion. He describes this as chronic in nature having been present for several years. He has a chronic cough related to allergies and stable angina. He does notice some right heel pain which has been present since his most recent stroke and occasionally has swelling around that heel as well. He currently denies difficulty sleeping, sore throat, head congestion, shortness of breath, palpitations, edema in legs/ abdomen, dizziness or weight gain.   Prior CV studies:   The following studies were reviewed today:  Echo report from 05/18/18 reviewed and showed an EF of  10-15%.  Past Medical History:  Diagnosis Date  . CHF (congestive heart failure) ([place])   . Chronic systolic heart failure ([place])    a. 10/2012 Echo: EF <20%, mod to sev dil LV, diast dysfxn, mildly dil LA/RA, mod to severe MR/TR.  [name] Coronary artery disease, non-occlusive    a. 09/2014 Cath: minor luminal irregularities with severely dilated left ventricle. LVEDP 30.  [name] H/O noncompliance with medical treatment, presenting hazards to health   . History of traumatic injury of head   . Hyperlipidemia   . Hypertension   . Morbid obesity ([place])   . NICM (nonischemic cardiomyopathy) ([place])    a. 10/2012 Echo: EF <20%.  . Polysubstance abuse ([place])    a. cocaine, tobacco, THC, ETOH  . Seizure disorder ([place])   . Stroke [facility])    Past Surgical History:  Procedure Laterality Date  . CARDIAC CATHETERIZATION     [facility]  . ICD IMPLANT N/A 12/08/2016   Procedure: ICD Implant;  Surgeon: Deboraha Sprang, MD;  Location: [facility];  Service: Cardiovascular;  Laterality: N/A;    Prior to Admission medications   Medication Sig Start Date End Date Taking? Authorizing Provider  ACCU-CHEK AVIVA PLUS test strip Check blood sugar up to 1 x daily 01/18/18  Yes Karamalegos, [name], DO  ACCU-CHEK SOFTCLIX LANCETS lancets Check blood sugar up to 1 x daily 01/18/18  Yes Karamalegos, [name], DO  apixaban (ELIQUIS) 5 MG TABS tablet Take 1 tablet (5 mg total) by mouth 2 (two) times daily. 07/23/18  Yes Deboraha Sprang, MD  ENTRESTO 49-51 MG take 1 tablet by mouth twice a day 11/25/16  Yes Hackney, [name], FNP  furosemide (LASIX) 40 MG tablet Take 10m in morning. Take half tab for dose 20 mg tablet every evening. 01/25/18  Yes HDarylene PriceA, FNP  metoprolol succinate (TOPROL-XL) 25 MG 24 hr tablet  Take 25 mg by mouth daily.  10/12/18  Yes Karamalegos, ADevonne Doughty DO  pravastatin (PRAVACHOL) 40 MG tablet TAKE 2 TABLETS BY MOUTH ONCE DAILY 07/02/18  Yes Karamalegos, ADevonne Doughty DO  Semaglutide,0.25 or  0.5MG/DOS, (OZEMPIC, 0.25 OR 0.5 MG/DOSE,) 2 MG/1.5ML SOPN Inject 0.5 mg into the skin once a week. 10/01/18  Yes Karamalegos, ADevonne Doughty DO  spironolactone (ALDACTONE) 25 MG tablet Take 0.5 tablets (12.5 mg total) by mouth daily. 07/13/18  Yes Karamalegos, ADevonne Doughty DO  blood glucose meter kit and supplies KIT Dispense based on patient and insurance preference. Check blood glucose once daily. 08/21/15   KLuciana Axe NP  Blood Glucose Monitoring Suppl (ACCU-CHEK AVIVA PLUS) w/Device KIT Check blood sugar up to 1 x daily 01/18/18   Karamalegos, ADevonne Doughty DO  cyclobenzaprine (FLEXERIL) 10 MG tablet Take 0.5-1 tablets (5-10 mg total) by mouth 3 (three) times daily as needed for muscle spasms. 04/13/18   KOlin Hauser DO      Allergies:   Tomato   Social History   Tobacco Use  . Smoking status: Former Smoker    Types: Cigars    Last attempt to quit: 01/02/2018    Years since quitting: 0.7  . Smokeless tobacco: Former [employer] . Tobacco comment: smokes ~10 black and milds per week  Substance Use Topics  . Alcohol use: Yes    Comment: 8-9 beers a month per pt  . Drug use: No     Family Hx: The patient's family history includes Brain cancer in his mother; Diabetes in his father and mother; Heart attack in his father and mother; Heart disease in his father; Heart failure in his father, maternal aunt, and mother; Hyperlipidemia in his father; Hypertension in his father; Sleep apnea in his brother, father, mother, and sister.  ROS:   Please see the history of present illness.     All other systems reviewed and are negative.   Labs/Other Tests and Data Reviewed:    Recent Labs: 05/18/2018: ALT 23; Magnesium 1.9 05/19/2018: Hemoglobin 14.2; Platelets 196 07/12/2018: BUN 17; Creat 1.49; Potassium 3.7; Sodium 141; TSH 2.00   Recent Lipid Panel Lab Results  Component Value Date/Time   CHOL 163 01/02/2018 03:01 AM   CHOL 183 10/14/2012 03:54 AM   TRIG 103 01/02/2018 03:01  AM   TRIG 79 10/14/2012 03:54 AM   HDL 33 (L) 01/02/2018 03:01 AM   HDL 44 10/14/2012 03:54 AM   CHOLHDL 4.9 01/02/2018 03:01 AM   LDLCALC 109 (H) 01/02/2018 03:01 AM   LDLCALC 123 (H) 10/14/2012 03:54 AM    Wt Readings from Last 3 Encounters:  10/18/18 289 lb (131.1 kg)  09/22/18 290 lb (131.5 kg)  07/12/18 299 lb 9.6 oz (135.9 kg)     Exam:    Vital Signs:  BP 124/85 Comment: self-reported  Pulse 75 Comment: self-reported  Wt 289 lb (131.1 kg) Comment: self-reported  BMI 39.20 kg/m    Well nourished, well developed male in no  acute distress.   ASSESSMENT & PLAN:    1. Chronic heart failure with reduced ejection fraction- - NYHA class II - euvolemic based on patient's symptoms -  not weighing daily but every other day and he says that his weight has been stable. 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 - consider titrating up entresto and/ or metoprolol at future visits  2: HTN- - BP at home was good this morning - saw PCP [facility]) 09/22/2018 - BMP from 07/12/2018 reviewed and showed sodium 141, potassium 3.7, creatinine 1.49 and GFR 63  3: Diabetes- - admits to not checking it often as he doesn't like to do it - most recent glucose was 110  COVID-19 Education: The signs and symptoms of COVID-19 were discussed with the patient and how to seek care for testing (follow up with PCP or arrange E-visit).  The importance of social distancing was discussed today.  Patient Risk:   After full review of this patients clinical status, I feel that they are at least moderate risk at this time.  Time:   Today, I have spent 12 minutes with the patient with telehealth technology discussing daily weights, medications, diet and symptoms to  call about.   Medication Adjustments/Labs and Tests Ordered: Current medicines are reviewed at length with the patient today.  Concerns regarding medicines are outlined above.   Tests Ordered: No orders of the defined types were placed in this encounter.  Medication Changes: RX for metoprolol and pravastatin sent to [facility] in [place] per patient request.   Disposition: Follow-up 3 months or sooner for any questions/problems before then.   Signed, Alisa Graff, FNP  10/18/2018 9:14 AM    [facility]

## 2018-11-24 ENCOUNTER — Telehealth: Payer: Self-pay | Admitting: Cardiovascular Disease

## 2018-11-24 NOTE — Telephone Encounter (Signed)
Virtual Visit Pre-Appointment Phone Call  "(Name), I am calling you today to discuss your upcoming appointment. We are currently trying to limit exposure to the virus that causes COVID-19 by seeing patients at home rather than in the office."  1. "What is the BEST phone number to call the day of the visit?" - include this in appointment notes  2. "Do you have or have access to (through a family member/friend) a smartphone with video capability that we can use for your visit?" a. If yes - list this number in appt notes as "cell" (if different from BEST phone #) and list the appointment type as a VIDEO visit in appointment notes b. If no - list the appointment type as a PHONE visit in appointment notes  3. Confirm consent - "In the setting of the current Covid19 crisis, you are scheduled for a (phone or video) visit with your provider on (date) at (time).  Just as we do with many in-office visits, in order for you to participate in this visit, we must obtain consent.  If you'd like, I can send this to your mychart (if signed up) or email for you to review.  Otherwise, I can obtain your verbal consent now.  All virtual visits are billed to your insurance company just like a normal visit would be.  By agreeing to a virtual visit, we'd like you to understand that the technology does not allow for your provider to perform an examination, and thus may limit your provider's ability to fully assess your condition. If your provider identifies any concerns that need to be evaluated in person, we will make arrangements to do so.  Finally, though the technology is pretty good, we cannot assure that it will always work on either your or our end, and in the setting of a video visit, we may have to convert it to a phone-only visit.  In either situation, we cannot ensure that we have a secure connection.  Are you willing to proceed?" STAFF: Did the patient verbally acknowledge consent to telehealth visit? Document  YES/NO here: YES  4. Advise patient to be prepared - "Two hours prior to your appointment, go ahead and check your blood pressure, pulse, oxygen saturation, and your weight (if you have the equipment to check those) and write them all down. When your visit starts, your provider will ask you for this information. If you have an Apple Watch or Kardia device, please plan to have heart rate information ready on the day of your appointment. Please have a pen and paper handy nearby the day of the visit as well."  5. Give patient instructions for MyChart download to smartphone OR Doximity/Doxy.me as below if video visit (depending on what platform provider is using)  6. Inform patient they will receive a phone call 15 minutes prior to their appointment time (may be from unknown caller ID) so they should be prepared to answer    TELEPHONE CALL NOTE  Kent Stanley. has been deemed a candidate for a follow-up tele-health visit to limit community exposure during the Covid-19 pandemic. I spoke with the patient via phone to ensure availability of phone/video source, confirm preferred email & phone number, and discuss instructions and expectations.  I reminded Kent Stanley. to be prepared with any vital sign and/or heart rhythm information that could potentially be obtained via home monitoring, at the time of his visit. I reminded Kent Stanley. to expect a phone  call prior to his visit.  Sandre Kitty 11/24/2018 2:11 PM   INSTRUCTIONS FOR DOWNLOADING THE MYCHART APP TO SMARTPHONE  - The patient must first make sure to have activated MyChart and know their login information - If Apple, go to [employer] and type in MyChart in the search bar and download the app. If Android, ask patient to go to [employer] and type in [place] in the search bar and download the app. The app is free but as with any other app downloads, their phone may require them to verify saved payment information  or Apple/Android password.  - The patient will need to then log into the app with their MyChart username and password, and select [facility] as their healthcare provider to link the account. When it is time for your visit, go to the MyChart app, find appointments, and click Begin Video Visit. Be sure to Select Allow for your device to access the Microphone and Camera for your visit. You will then be connected, and your provider will be with you shortly.  **If they have any issues connecting, or need assistance please contact MyChart service desk [phone])**  **If using a computer, in order to ensure the best quality for their visit they will need to use either of the following Internet Browsers: [employer] or Google Chrome**  IF USING DOXIMITY or DOXY.ME - The patient will receive a link just prior to their visit by text.     FULL LENGTH CONSENT FOR TELE-HEALTH VISIT   I hereby voluntarily request, consent and authorize [facility] and its employed or contracted physicians, physician assistants, nurse practitioners or other licensed health care professionals (the Practitioner), to provide me with telemedicine health care services (the "Services") as deemed necessary by the treating Practitioner. I acknowledge and consent to receive the Services by the Practitioner via telemedicine. I understand that the telemedicine visit will involve communicating with the Practitioner through live audiovisual communication technology and the disclosure of certain medical information by electronic transmission. I acknowledge that I have been given the opportunity to request an in-person assessment or other available alternative prior to the telemedicine visit and am voluntarily participating in the telemedicine visit.  I understand that I have the right to withhold or withdraw my consent to the use of telemedicine in the course of my care at any time, without affecting my right to future  care or treatment, and that the Practitioner or I may terminate the telemedicine visit at any time. I understand that I have the right to inspect all information obtained and/or recorded in the course of the telemedicine visit and may receive copies of available information for a reasonable fee.  I understand that some of the potential risks of receiving the Services via telemedicine include:  [name] Delay or interruption in medical evaluation due to technological equipment failure or disruption; . Information transmitted may not be sufficient (e.g. poor resolution of images) to allow for appropriate medical decision making by the Practitioner; and/or  . In rare instances, security protocols could fail, causing a breach of personal health information.  Furthermore, I acknowledge that it is my responsibility to provide information about my medical history, conditions and care that is complete and accurate to the best of my ability. I acknowledge that Practitioner's advice, recommendations, and/or decision may be based on factors not within their control, such as incomplete or inaccurate data provided by me or distortions of diagnostic images or specimens that may result from electronic  transmissions. I understand that the practice of medicine is not an exact science and that Practitioner makes no warranties or guarantees regarding treatment outcomes. I acknowledge that I will receive a copy of this consent concurrently upon execution via email to the email address I last provided but may also request a printed copy by calling the office of [facility].    I understand that my insurance will be billed for this visit.   I have read or had this consent read to me. . I understand the contents of this consent, which adequately explains the benefits and risks of the Services being provided via telemedicine.  . I have been provided ample opportunity to ask questions regarding this consent and the Services and have  had my questions answered to my satisfaction. . I give my informed consent for the services to be provided through the use of telemedicine in my medical care  By participating in this telemedicine visit I agree to the above.

## 2018-11-26 ENCOUNTER — Other Ambulatory Visit: Payer: Self-pay

## 2018-11-26 ENCOUNTER — Telehealth (INDEPENDENT_AMBULATORY_CARE_PROVIDER_SITE_OTHER): Payer: Medicare Other | Admitting: Cardiovascular Disease

## 2018-11-26 ENCOUNTER — Encounter: Payer: Self-pay | Admitting: Cardiovascular Disease

## 2018-11-26 VITALS — Ht 72.0 in | Wt 287.0 lb

## 2018-11-26 DIAGNOSIS — I5022 Chronic systolic (congestive) heart failure: Secondary | ICD-10-CM | POA: Diagnosis not present

## 2018-11-26 NOTE — Patient Instructions (Signed)
Medication Instructions:  No change in medications If you need a refill on your cardiac medications before your next appointment, please call your pharmacy.   Lab work: None If you have labs (blood work) drawn today and your tests are completely normal, you will receive your results only by: . MyChart Message (if you have MyChart) OR . A paper copy in the mail If you have any lab test that is abnormal or we need to change your treatment, we will call you to review the results.  Testing/Procedures: None  Follow-Up: At [facility], you and your health needs are our priority.  As part of our continuing mission to provide you with exceptional heart care, we have created designated Provider Care Teams.  These Care Teams include your primary Cardiologist (physician) and Advanced Practice Providers (APPs -  Physician Assistants and Nurse Practitioners) who all work together to provide you with the care you need, when you need it. You will need a follow up appointment in 3 months.  Please call our office 2 months in advance to schedule this appointment.  You may see Rosemary Mossbarger, MD or one of the following Advanced Practice Providers on your designated Care Team:   Christopher Berge, NP Ryan Dunn, PA-C . Jacquelyn Visser, PA-C  

## 2018-11-26 NOTE — Progress Notes (Signed)
Virtual Visit via Video Note   This visit type was conducted due to national recommendations for restrictions regarding the COVID-19 Pandemic (e.g. social distancing) in an effort to limit this patient's exposure and mitigate transmission in our community.  Due to his co-morbid illnesses, this patient is at least at moderate risk for complications without adequate follow up.  This format is felt to be most appropriate for this patient at this time.  All issues noted in this document were discussed and addressed.  A limited physical exam was performed with this format.  Please refer to the patient's chart for his consent to telehealth for [facility].   Date:  11/26/2018   ID:  Kent Kindred., DOB [birth year], MRN [id number]  Patient Location: Home Provider Location: Home  PCP:  Olin Hauser, DO  Cardiologist:  Kathlyn Sacramento, MD  Electrophysiologist:  None   Evaluation Performed:  Follow-Up Visit  Chief Complaint: No complaints today.  History of Present Illness:    Kent Stanley. is a 48 y.o. male who was seen via video visit for follow-up regarding chronic systolic heart failure due to nonischemic cardiomyopathy. He was diagnosed with systolic heart failure in 2014 with initial ejection fraction less than 20%.   He has a history of a seizure disorder since he was 3, hypertension, prior stroke in 2017 , paroxysmal atrial fibrillation on Eliquis, chronic kidney disease, morbid obesity and  severe sleep apnea. He quit smoking in 2014. He drinks red wine very rarely. He has used marijuana and cocaine in the past but none recently. Cardiac catheterization in March, 2016 showed minor luminal irregularities without evidence of obstructive coronary artery disease.Left ventricular end-diastolic pressure was 30 mmHg.   He had an ICD placement by Dr. [name] in 2018.  He was hospitalized in November 2019 with chest pain and minimally elevated troponin.  He underwent an  echocardiogram which showed an EF of 10 to 15% with diffuse hypokinesis.  No further ischemic work-up was performed.  ICD device interrogation showed no evidence of arrhythmia.  He has been doing well with no recent chest pain or significant dyspnea.  No orthopnea or leg edema.  He is waiting to go back to work.  He works as a Training and development officer at [employer].  He takes his medications regularly  The patient does not have symptoms concerning for COVID-19 infection (fever, chills, cough, or new shortness of breath).    Past Medical History:  Diagnosis Date  . CHF (congestive heart failure) ([place])   . Chronic systolic heart failure ([place])    a. 10/2012 Echo: EF <20%, mod to sev dil LV, diast dysfxn, mildly dil LA/RA, mod to severe MR/TR.  [name] Coronary artery disease, non-occlusive    a. 09/2014 Cath: minor luminal irregularities with severely dilated left ventricle. LVEDP 30.  [name] H/O noncompliance with medical treatment, presenting hazards to health   . History of traumatic injury of head   . Hyperlipidemia   . Hypertension   . Morbid obesity ([place])   . NICM (nonischemic cardiomyopathy) ([place])    a. 10/2012 Echo: EF <20%.  . Polysubstance abuse ([place])    a. cocaine, tobacco, THC, ETOH  . Seizure disorder ([place])   . Stroke [facility])    Past Surgical History:  Procedure Laterality Date  . CARDIAC CATHETERIZATION     [facility]  . ICD IMPLANT N/A 12/08/2016   Procedure: ICD Implant;  Surgeon: Deboraha Sprang, MD;  Location: [place] [facility];  Service: Cardiovascular;  Laterality: N/A;     Current Meds  Medication Sig  . ACCU-CHEK AVIVA PLUS test strip Check blood sugar up to 1 x daily  . ACCU-CHEK SOFTCLIX LANCETS lancets Check blood sugar up to 1 x daily  . apixaban (ELIQUIS) 5 MG TABS tablet Take 1 tablet (5 mg total) by mouth 2 (two) times daily.  . blood glucose meter kit and supplies KIT Dispense based on patient and insurance preference. Check blood glucose once daily.  . Blood Glucose Monitoring Suppl  (ACCU-CHEK AVIVA PLUS) w/Device KIT Check blood sugar up to 1 x daily  . ENTRESTO 49-51 MG take 1 tablet by mouth twice a day  . furosemide (LASIX) 40 MG tablet Take '40mg'$  in morning. Take half tab for dose 20 mg tablet every evening.  . metoprolol succinate (TOPROL-XL) 25 MG 24 hr tablet Take 1 tablet (25 mg total) by mouth daily.  . pravastatin (PRAVACHOL) 40 MG tablet Take 2 tablets (80 mg total) by mouth daily.  . Semaglutide,0.25 or 0.'5MG'$ /DOS, (OZEMPIC, 0.25 OR 0.5 MG/DOSE,) 2 MG/1.5ML SOPN Inject 0.5 mg into the skin once a week.  . spironolactone (ALDACTONE) 25 MG tablet Take 0.5 tablets (12.5 mg total) by mouth daily.     Allergies:   Tomato   Social History   Tobacco Use  . Smoking status: Former Smoker    Types: Cigars    Last attempt to quit: 01/02/2018    Years since quitting: 0.8  . Smokeless tobacco: Former Systems developer  . Tobacco comment: smokes ~10 black and milds per week  Substance Use Topics  . Alcohol use: Yes    Comment: 8-9 beers a month per pt  . Drug use: No     Family Hx: The patient's family history includes Brain cancer in his mother; Diabetes in his father and mother; Heart attack in his father and mother; Heart disease in his father; Heart failure in his father, maternal aunt, and mother; Hyperlipidemia in his father; Hypertension in his father; Sleep apnea in his brother, father, mother, and sister.  ROS:   Please see the history of present illness.     All other systems reviewed and are negative.   Prior CV studies:   The following studies were reviewed today:  Reviewed most recent echocardiogram done in November 2019  Labs/Other Tests and Data Reviewed:    EKG:  No ECG reviewed.  Recent Labs: 05/18/2018: ALT 23; Magnesium 1.9 05/19/2018: Hemoglobin 14.2; Platelets 196 07/12/2018: BUN 17; Creat 1.49; Potassium 3.7; Sodium 141; TSH 2.00   Recent Lipid Panel Lab Results  Component Value Date/Time   CHOL 163 01/02/2018 03:01 AM   CHOL 183  10/14/2012 03:54 AM   TRIG 103 01/02/2018 03:01 AM   TRIG 79 10/14/2012 03:54 AM   HDL 33 (L) 01/02/2018 03:01 AM   HDL 44 10/14/2012 03:54 AM   CHOLHDL 4.9 01/02/2018 03:01 AM   LDLCALC 109 (H) 01/02/2018 03:01 AM   LDLCALC 123 (H) 10/14/2012 03:54 AM    Wt Readings from Last 3 Encounters:  11/26/18 287 lb (130.2 kg)  10/18/18 289 lb (131.1 kg)  09/22/18 290 lb (131.5 kg)     Objective:    Vital Signs:  Ht 6' (1.829 m)   Wt 287 lb (130.2 kg)   BMI 38.92 kg/m    VITAL SIGNS:  reviewed GEN:  no acute distress EYES:  sclerae anicteric, EOMI - Extraocular Movements Intact RESPIRATORY:  normal respiratory effort, symmetric expansion SKIN:  no rash,  lesions or ulcers. MUSCULOSKELETAL:  no obvious deformities. NEURO:  alert and oriented x 3, no obvious focal deficit PSYCH:  normal affect  ASSESSMENT & PLAN:    1.  Chronic systolic heart failure due to nonischemic cardiomyopathy: He seems to be euvolemic based on weight and symptoms.  He is currently New York Heart Association class II in spite of significantly low ejection fraction in November.  He is currently on optimal medical therapy but I would consider switching Toprol to carvedilol in the near future.  2.  Essential hypertension: He could not check his blood pressure today but his blood pressure has been controlled.  3.  Paroxysmal atrial fibrillation: No recent arrhythmia.  He continues to take Eliquis for anticoagulation  4.  Severe sleep apnea: Currently on CPAP.  COVID-19 Education: The signs and symptoms of COVID-19 were discussed with the patient and how to seek care for testing (follow up with PCP or arrange E-visit).  The importance of social distancing was discussed today.  Time:   Today, I have spent 12 minutes with the patient with telehealth technology discussing the above problems.     Medication Adjustments/Labs and Tests Ordered: Current medicines are reviewed at length with the patient today.   Concerns regarding medicines are outlined above.   Tests Ordered: No orders of the defined types were placed in this encounter.   Medication Changes: No orders of the defined types were placed in this encounter.   Disposition:  Follow up in 3 month(s)  Signed, Kathlyn Sacramento, MD  11/26/2018 9:55 AM    [facility]

## 2018-12-23 ENCOUNTER — Ambulatory Visit (INDEPENDENT_AMBULATORY_CARE_PROVIDER_SITE_OTHER): Payer: Medicare Other | Admitting: *Deleted

## 2018-12-23 DIAGNOSIS — I428 Other cardiomyopathies: Secondary | ICD-10-CM

## 2018-12-23 DIAGNOSIS — I5022 Chronic systolic (congestive) heart failure: Secondary | ICD-10-CM

## 2018-12-23 LAB — CUP PACEART REMOTE DEVICE CHECK
Battery Remaining Longevity: 125 mo
Battery Voltage: 3.02 V
Brady Statistic RV Percent Paced: 0.01 %
Date Time Interrogation Session: 20200618052205
HighPow Impedance: 82 Ohm
Implantable Lead Implant Date: 20180604
Implantable Lead Location: 753860
Implantable Lead Model: 293
Implantable Lead Serial Number: 433424
Implantable Pulse Generator Implant Date: 20180604
Lead Channel Impedance Value: 361 Ohm
Lead Channel Impedance Value: 361 Ohm
Lead Channel Pacing Threshold Amplitude: 2 V
Lead Channel Pacing Threshold Pulse Width: 0.4 ms
Lead Channel Sensing Intrinsic Amplitude: 10.25 mV
Lead Channel Setting Pacing Amplitude: 3.5 V
Lead Channel Setting Pacing Pulse Width: 0.8 ms
Lead Channel Setting Sensing Sensitivity: 0.3 mV

## 2018-12-28 NOTE — Progress Notes (Signed)
Remote ICD transmission.   

## 2019-01-15 NOTE — Progress Notes (Deleted)
Patient ID: Kent Stanley., male    DOB: 08/10/1970, 48 y.o.   MRN: [id number]  HPI  Kent Stanley is a 48 y/o male with a history of stroke, CKD stage 2, CAD, hyperlipidemia, HTN, morbid obesity, polysubstance abuse in the past, prior tobacco use, seizures, DM, ICD implantation and chronic heart failure.  Echo report from 05/18/18 reviewed and showed an EF of 10-15%. Echo done 01/02/18 reviewed and showed an EF of 20-25% along with moderate Kent, trivial TR and PA pressure of 22 mm Hg. Echo done 10/24/16 which showed an EF of 15-20%. EF has declined from previous echo which was done 03/28/16 which showed an EF of 20-25% without regurgitation. Last catheterization was done March 2016.  Has not been admitted or been in the ED in the last 6 months.   He presents today for a follow-up visit with a chief complaint of    Past Medical History:  Diagnosis Date  . CHF (congestive heart failure) ([facility])   . Chronic systolic heart failure ([facility])    a. 10/2012 Echo: EF <20%, mod to sev dil LV, diast dysfxn, mildly dil LA/RA, mod to severe Kent/TR.  [name] Coronary artery disease, non-occlusive    a. 09/2014 Cath: minor luminal irregularities with severely dilated left ventricle. LVEDP 30.  [name] H/O noncompliance with medical treatment, presenting hazards to health   . History of traumatic injury of head   . Hyperlipidemia   . Hypertension   . Morbid obesity ([facility])   . NICM (nonischemic cardiomyopathy) ([facility])    a. 10/2012 Echo: EF <20%.  . Polysubstance abuse ([facility])    a. cocaine, tobacco, THC, ETOH  . Seizure disorder ([facility])   . Stroke [facility])    Past Surgical History:  Procedure Laterality Date  . CARDIAC CATHETERIZATION     [facility]  . ICD IMPLANT N/A 12/08/2016   Procedure: ICD Implant;  Surgeon: Duke Salvia, MD;  Location: [facility] INVASIVE CV LAB;  Service: Cardiovascular;  Laterality: N/A;   Family History  Problem Relation Age of Onset  . Diabetes Mother   . Brain cancer Mother   . Heart attack Mother   .  Sleep apnea Mother   . Heart failure Mother   . Hypertension Father   . Diabetes Father   . Heart disease Father   . Hyperlipidemia Father   . Heart attack Father   . Heart failure Father   . Sleep apnea Father   . Sleep apnea Sister   . Sleep apnea Brother   . Heart failure Maternal Aunt    Social History   Tobacco Use  . Smoking status: Former Smoker    Types: Cigars    Quit date: 01/02/2018    Years since quitting: 1.0  . Smokeless tobacco: Former Neurosurgeon  . Tobacco comment: smokes ~10 black and milds per week  Substance Use Topics  . Alcohol use: Yes    Comment: 8-9 beers a month per pt   Allergies  Allergen Reactions  . Tomato Rash     Review of Systems  Constitutional: Negative for appetite change and fatigue.  HENT: Negative for congestion, postnasal drip and sore throat.   Eyes: Negative.   Respiratory: Negative for cough, chest tightness, shortness of breath and wheezing.   Cardiovascular: Positive for chest pain ("chronic angina"). Negative for palpitations and leg swelling.  Gastrointestinal: Negative for abdominal distention and abdominal pain.  Endocrine: Negative.   Genitourinary: Negative.   Musculoskeletal: Negative for back pain and neck  pain.  Skin: Negative.   Allergic/Immunologic: Negative.   Neurological: Positive for numbness (tips of fingers at times). Negative for dizziness and light-headedness.  Hematological: Negative for adenopathy. Does not bruise/bleed easily.  Psychiatric/Behavioral: Negative for dysphoric mood and sleep disturbance (slept with CPAP last night for 9 hours). The patient is not nervous/anxious.      Physical Exam  Constitutional: He is oriented to person, place, and time. He appears well-developed and well-nourished.  HENT:  Head: Normocephalic and atraumatic.  Neck: Normal range of motion. Neck supple. No JVD present.  Cardiovascular: Normal rate and regular rhythm.  Pulmonary/Chest: Effort normal. He has no wheezes. He  has no rales.  Abdominal: Soft. He exhibits no distension. There is no abdominal tenderness.  Musculoskeletal:        General: No tenderness or edema.  Neurological: He is alert and oriented to person, place, and time.  Skin: Skin is warm and dry.  Psychiatric: He has a normal mood and affect. His behavior is normal. Thought content normal.  Nursing note and vitals reviewed.   Assessment & Plan:  1: Chronic heart failure with reduced ejection fraction- - NYHA Class I - euvolemic today - continue weighing daily; To call for an overnight weight gain of >2 pounds or a weekly weight gain of >5 pounds. - weight 295.8 from last visit 9 months ago - Using Mrs. Dash and trying to follow a 2000mg  sodium diet - riding stationary bike  - had telemedicine visit with cardiologist Fletcher [name]) 11/26/2018 - saw pulmonologist Ashby Dawes) 01/25/18 - saw EP [name]) 03/02/18 - BNP 02/12/16 was 403.8 - does not meet ReDS criteria due to BMI - not sure current BP could tolerate titration of entreso - could also titrate up metoprolol succinate in the future as well if able   2: HTN- - BP  - saw PCP Jerene Pitch) 09/22/2018 - BMP 07/12/2018 reviewed and showed sodium 141, potassium 3.7, creatinine 1.49 and GFR 63  3: Diabetes- - glucose at home last night was  - A1c 07/12/2018 was 6.3%  4: Tobacco use- - has smoked infrequently since last here - drinks ~ 3-4 beers every few days - complete cessation discussed for 3 minutes with him  Patient did not bring his medications nor a list. Each medication was verbally reviewed with the patient and he was encouraged to bring the bottles to every visit to confirm accuracy of list.

## 2019-01-18 ENCOUNTER — Ambulatory Visit: Payer: Medicare Other | Admitting: Family

## 2019-01-18 ENCOUNTER — Telehealth: Payer: Self-pay | Admitting: Family

## 2019-01-18 NOTE — Telephone Encounter (Signed)
Patient did not show for his [facility] appointment on 01/18/2019. Will attempt to reschedule.  

## 2019-01-25 ENCOUNTER — Other Ambulatory Visit: Payer: Self-pay | Admitting: Family Medicine

## 2019-01-25 DIAGNOSIS — I5022 Chronic systolic (congestive) heart failure: Secondary | ICD-10-CM

## 2019-02-03 ENCOUNTER — Other Ambulatory Visit: Payer: Self-pay | Admitting: Family Medicine

## 2019-02-03 DIAGNOSIS — N5201 Erectile dysfunction due to arterial insufficiency: Secondary | ICD-10-CM

## 2019-02-07 ENCOUNTER — Telehealth: Payer: Self-pay | Admitting: Cardiovascular Disease

## 2019-02-07 NOTE — Telephone Encounter (Signed)
Please review for refill on Sildenafil 20 mg

## 2019-02-07 NOTE — Telephone Encounter (Signed)
°*  STAT* If patient is at the pharmacy, call can be transferred to refill team.   1. Which medications need to be refilled? (please list name of each medication and dose if known) Sildenafil 20 mg   2. Which pharmacy/location (including street and city if local pharmacy) is medication to be sent to? [facility] on [address] ([place])  3. Do they need a 30 day or 90 day supply? 60 tablet Rx   Patient states the pharmacy might need a pre-auth, his prescribing doctor is not answering the phone and he was told to call his cardiologist office.Patietn is aware that insurance will not cover this, he is okay with paying oop. For any questions please call patient.

## 2019-02-09 NOTE — Progress Notes (Signed)
Patient ID: Kent Kindred., male    DOB: 18-Sep-1970, 48 y.o.   MRN: [id number]  HPI  Kent Stanley is a 48 y/o male with a history of stroke, CKD stage 2, CAD, hyperlipidemia, HTN, morbid obesity, polysubstance abuse in the past, prior tobacco use, seizures, DM, ICD implantation and chronic heart failure.  Echo report from 05/18/18 reviewed and showed an EF of 10-15%. Echo done 01/02/18 reviewed and showed an EF of 20-25% along with moderate Kent, trivial TR and PA pressure of 22 mm Hg. Echo done 10/24/16 which showed an EF of 15-20%. EF has declined from previous echo which was done 03/28/16 which showed an EF of 20-25% without regurgitation. Last catheterization was done March 2016.  Has not been admitted or been in the ED in the last 6 months.   He presents today for a follow-up visit with a chief complaint of stable, chronic angina. He describes this as chronic in nature having been present for several years and says that his angina pattern is unchanged. He has associated weight gain along with this but says his weight gain has been gradual due to COVID-19 and staying home. He denies any difficulty sleeping, abdominal distention, palpitations, pedal edema, shortness of breath, dizziness, cough or fatigue.   Past Medical History:  Diagnosis Date  . CHF (congestive heart failure) ([place])   . Chronic systolic heart failure ([place])    a. 10/2012 Echo: EF <20%, mod to sev dil LV, diast dysfxn, mildly dil LA/RA, mod to severe Kent/TR.  [name] Coronary artery disease, non-occlusive    a. 09/2014 Cath: minor luminal irregularities with severely dilated left ventricle. LVEDP 30.  [name] H/O noncompliance with medical treatment, presenting hazards to health   . History of traumatic injury of head   . Hyperlipidemia   . Hypertension   . Morbid obesity ([place])   . NICM (nonischemic cardiomyopathy) ([place])    a. 10/2012 Echo: EF <20%.  . Polysubstance abuse ([place])    a. cocaine, tobacco, THC, ETOH  . Seizure disorder ([place])    . Stroke [facility])    Past Surgical History:  Procedure Laterality Date  . CARDIAC CATHETERIZATION     [facility]  . ICD IMPLANT N/A 12/08/2016   Procedure: ICD Implant;  Surgeon: Deboraha Sprang, MD;  Location: [place] CV [facility];  Service: Cardiovascular;  Laterality: N/A;   Family History  Problem Relation Age of Onset  . Diabetes Mother   . Brain cancer Mother   . Heart attack Mother   . Sleep apnea Mother   . Heart failure Mother   . Hypertension Father   . Diabetes Father   . Heart disease Father   . Hyperlipidemia Father   . Heart attack Father   . Heart failure Father   . Sleep apnea Father   . Sleep apnea Sister   . Sleep apnea Brother   . Heart failure Maternal Aunt    Social History   Tobacco Use  . Smoking status: Former Smoker    Types: Cigars    Quit date: 01/02/2018    Years since quitting: 1.1  . Smokeless tobacco: Former Systems developer  . Tobacco comment: smokes ~10 black and milds per week  Substance Use Topics  . Alcohol use: Yes    Comment: 8-9 beers a month per pt   Allergies  Allergen Reactions  . Tomato Rash   Prior to Admission medications   Medication Sig Start Date End Date Taking? Authorizing Provider  ACCU-CHEK AVIVA  PLUS test strip Check blood sugar up to 1 x daily 01/18/18  Yes Karamalegos, [name], DO  ACCU-CHEK SOFTCLIX LANCETS lancets Check blood sugar up to 1 x daily 01/18/18  Yes Karamalegos, [name], DO  apixaban (ELIQUIS) 5 MG TABS tablet Take 1 tablet (5 mg total) by mouth 2 (two) times daily. 07/23/18  Yes Deboraha Sprang, MD  blood glucose meter kit and supplies KIT Dispense based on patient and insurance preference. Check blood glucose once daily. 08/21/15  Yes [name], [name], NP  Blood Glucose Monitoring Suppl (ACCU-CHEK AVIVA PLUS) w/Device KIT Check blood sugar up to 1 x daily 01/18/18  Yes Olin Hauser, DO  ENTRESTO 49-51 MG take 1 tablet by mouth twice a day 11/25/16  Yes , Otila Kluver A, FNP  furosemide (LASIX) 40 MG  tablet TAKE ONE TABLET BY MOUTH IN THE MORNING. TAKE ONE-HALF TABLET BY MOUTH IN THE EVENING 01/25/19  Yes Karamalegos, [name], DO  metoprolol succinate (TOPROL-XL) 25 MG 24 hr tablet Take 1 tablet (25 mg total) by mouth daily. 10/18/18  Yes [name], FNP  pravastatin (PRAVACHOL) 40 MG tablet Take 2 tablets (80 mg total) by mouth daily. 10/18/18  Yes , Otila Kluver A, FNP  Semaglutide,0.25 or 0.'5MG'$ /DOS, (OZEMPIC, 0.25 OR 0.5 MG/DOSE,) 2 MG/1.5ML SOPN Inject 0.5 mg into the skin once a week. 10/01/18  Yes Karamalegos, Alexander J, DO  sildenafil (REVATIO) 20 MG tablet TAKE FIVE TABLETS BY MOUTH AS NEEDED. TAKE ABOUT 30 MINUTES PRIOR TO SEX. STOP TAKING IF CHEST PAIN. DO NOT TAKE WITH NITRO 02/03/19  Yes Karamalegos, [name], DO  spironolactone (ALDACTONE) 25 MG tablet Take 0.5 tablets (12.5 mg total) by mouth daily. 07/13/18  Yes Karamalegos, [name], DO    Review of Systems  Constitutional: Negative for appetite change and fatigue.  HENT: Negative for congestion, postnasal drip and sore throat.   Eyes: Negative.   Respiratory: Negative for cough, chest tightness, shortness of breath and wheezing.   Cardiovascular: Positive for chest pain ("chronic angina"). Negative for palpitations and leg swelling.  Gastrointestinal: Negative for abdominal distention and abdominal pain.  Endocrine: Negative.   Genitourinary: Negative.   Musculoskeletal: Negative for back pain and neck pain.  Skin: Negative.   Allergic/Immunologic: Negative.   Neurological: Negative for dizziness, light-headedness and numbness.  Hematological: Negative for adenopathy. Does not bruise/bleed easily.  Psychiatric/Behavioral: Negative for dysphoric mood and sleep disturbance (slept with CPAP last night for 9 hours). The patient is not nervous/anxious.    Vitals:   02/10/19 1406  BP: 127/86  Pulse: 75  Resp: 18  SpO2: 100%  Weight: 297 lb 6 oz (134.9 kg)  Height: 6' (1.829 m)   Wt Readings from Last 3  Encounters:  02/10/19 297 lb 6 oz (134.9 kg)  11/26/18 287 lb (130.2 kg)  10/18/18 289 lb (131.1 kg)   Lab Results  Component Value Date   CREATININE 1.49 (H) 07/12/2018   CREATININE 1.56 (H) 05/18/2018   CREATININE 1.58 (H) 03/30/2018    Physical Exam  Constitutional: He is oriented to person, place, and time. He appears well-developed and well-nourished.  HENT:  Head: Normocephalic and atraumatic.  Neck: Normal range of motion. Neck supple. No JVD present.  Cardiovascular: Normal rate and regular rhythm.  Pulmonary/Chest: Effort normal. He has no wheezes. He has no rales.  Abdominal: Soft. He exhibits no distension. There is no abdominal tenderness.  Musculoskeletal:        General: No tenderness or edema.  Neurological: He is alert and oriented to person, place, and time.  Skin: Skin is warm and dry.  Psychiatric: He has a normal mood and affect. His behavior is normal. Thought content normal.  Nursing note and vitals reviewed.   Assessment & Plan:  1: Chronic heart failure with reduced ejection fraction- - NYHA Class I - euvolemic today - continue weighing daily; To call for an overnight weight gain of >2 pounds or a weekly weight gain of >5 pounds. - weight up 2 pounds from last visit 9 months ago - Using Mrs. Dash and trying to follow a '2000mg'$  sodium diet - riding stationary bike  - had telemedicine visit with cardiologist Fletcher [name]) 11/26/2018 - saw pulmonologist Ashby Dawes) 01/25/18 - saw EP [name]) 03/02/18 - BNP 02/12/16 was 403.8  2: HTN- - BP looks good today - saw PCP Jerene Pitch) 09/22/2018 - BMP 07/12/2018 reviewed and showed sodium 141, potassium 3.7, creatinine 1.49 and GFR 63  3: Diabetes- - checking glucose weekly and says that it's been "ok" - A1c 07/12/2018 was 6.3%  4: Tobacco use- - has smoked infrequently since last here - drinks ~ 3-4 beers every few days - complete cessation discussed for 3 minutes with him  Patient did not bring his medications  nor a list. Each medication was verbally reviewed with the patient and he was encouraged to bring the bottles to every visit to confirm accuracy of list.  Return in 6 weeks or sooner for any questions/problems before then.

## 2019-02-10 ENCOUNTER — Encounter: Payer: Self-pay | Admitting: Family

## 2019-02-10 ENCOUNTER — Ambulatory Visit: Payer: Medicare Other | Attending: Family | Admitting: Family

## 2019-02-10 ENCOUNTER — Other Ambulatory Visit: Payer: Self-pay

## 2019-02-10 VITALS — BP 127/86 | HR 75 | Resp 18 | Ht 72.0 in | Wt 297.4 lb

## 2019-02-10 DIAGNOSIS — E785 Hyperlipidemia, unspecified: Secondary | ICD-10-CM | POA: Insufficient documentation

## 2019-02-10 DIAGNOSIS — Z87891 Personal history of nicotine dependence: Secondary | ICD-10-CM | POA: Insufficient documentation

## 2019-02-10 DIAGNOSIS — N182 Chronic kidney disease, stage 2 (mild): Secondary | ICD-10-CM | POA: Diagnosis not present

## 2019-02-10 DIAGNOSIS — Z95810 Presence of automatic (implantable) cardiac defibrillator: Secondary | ICD-10-CM | POA: Diagnosis not present

## 2019-02-10 DIAGNOSIS — F17200 Nicotine dependence, unspecified, uncomplicated: Secondary | ICD-10-CM

## 2019-02-10 DIAGNOSIS — Z79899 Other long term (current) drug therapy: Secondary | ICD-10-CM | POA: Insufficient documentation

## 2019-02-10 DIAGNOSIS — Z7901 Long term (current) use of anticoagulants: Secondary | ICD-10-CM | POA: Insufficient documentation

## 2019-02-10 DIAGNOSIS — E1122 Type 2 diabetes mellitus with diabetic chronic kidney disease: Secondary | ICD-10-CM | POA: Insufficient documentation

## 2019-02-10 DIAGNOSIS — I5022 Chronic systolic (congestive) heart failure: Secondary | ICD-10-CM | POA: Diagnosis not present

## 2019-02-10 DIAGNOSIS — I509 Heart failure, unspecified: Secondary | ICD-10-CM | POA: Diagnosis present

## 2019-02-10 DIAGNOSIS — I130 Hypertensive heart and chronic kidney disease with heart failure and stage 1 through stage 4 chronic kidney disease, or unspecified chronic kidney disease: Secondary | ICD-10-CM | POA: Diagnosis not present

## 2019-02-10 DIAGNOSIS — Z8249 Family history of ischemic heart disease and other diseases of the circulatory system: Secondary | ICD-10-CM | POA: Diagnosis not present

## 2019-02-10 DIAGNOSIS — Z8673 Personal history of transient ischemic attack (TIA), and cerebral infarction without residual deficits: Secondary | ICD-10-CM | POA: Insufficient documentation

## 2019-02-10 DIAGNOSIS — Z833 Family history of diabetes mellitus: Secondary | ICD-10-CM | POA: Diagnosis not present

## 2019-02-10 DIAGNOSIS — I2510 Atherosclerotic heart disease of native coronary artery without angina pectoris: Secondary | ICD-10-CM | POA: Insufficient documentation

## 2019-02-10 DIAGNOSIS — Z6841 Body Mass Index (BMI) 40.0 and over, adult: Secondary | ICD-10-CM | POA: Insufficient documentation

## 2019-02-10 DIAGNOSIS — E1121 Type 2 diabetes mellitus with diabetic nephropathy: Secondary | ICD-10-CM

## 2019-02-10 DIAGNOSIS — G40909 Epilepsy, unspecified, not intractable, without status epilepticus: Secondary | ICD-10-CM | POA: Insufficient documentation

## 2019-02-10 DIAGNOSIS — I10 Essential (primary) hypertension: Secondary | ICD-10-CM

## 2019-02-10 NOTE — Patient Instructions (Signed)
Continue weighing daily and call for an overnight weight gain of > 2 pounds or a weekly weight gain of >5 pounds. 

## 2019-03-08 ENCOUNTER — Other Ambulatory Visit: Payer: Self-pay | Admitting: Internal Medicine

## 2019-03-08 NOTE — Telephone Encounter (Signed)
28m Scr 1.49 07/12/18 134.9kg Lovw/klein 03/02/18

## 2019-03-28 ENCOUNTER — Ambulatory Visit (INDEPENDENT_AMBULATORY_CARE_PROVIDER_SITE_OTHER): Payer: Medicare Other | Admitting: *Deleted

## 2019-03-28 DIAGNOSIS — I5022 Chronic systolic (congestive) heart failure: Secondary | ICD-10-CM | POA: Diagnosis not present

## 2019-03-28 DIAGNOSIS — I428 Other cardiomyopathies: Secondary | ICD-10-CM

## 2019-03-29 LAB — CUP PACEART REMOTE DEVICE CHECK
Battery Remaining Longevity: 123 mo
Battery Voltage: 3.01 V
Brady Statistic RV Percent Paced: 0.01 %
Date Time Interrogation Session: 20200921063325
HighPow Impedance: 84 Ohm
Implantable Lead Implant Date: 20180604
Implantable Lead Location: 753860
Implantable Lead Model: 293
Implantable Lead Serial Number: 433424
Implantable Pulse Generator Implant Date: 20180604
Lead Channel Impedance Value: 342 Ohm
Lead Channel Impedance Value: 342 Ohm
Lead Channel Pacing Threshold Amplitude: 2 V
Lead Channel Pacing Threshold Pulse Width: 0.4 ms
Lead Channel Sensing Intrinsic Amplitude: 9 mV
Lead Channel Sensing Intrinsic Amplitude: 9 mV
Lead Channel Setting Pacing Amplitude: 3.5 V
Lead Channel Setting Pacing Pulse Width: 0.8 ms
Lead Channel Setting Sensing Sensitivity: 0.3 mV

## 2019-04-04 NOTE — Progress Notes (Deleted)
Patient ID: Kent Stanley., male    DOB: 01-03-1971, 48 y.o.   MRN: [id number]  HPI  Kent Stanley is a 48 y/o male with a history of stroke, CKD stage 2, CAD, hyperlipidemia, HTN, morbid obesity, polysubstance abuse in the past, prior tobacco use, seizures, DM, ICD implantation and chronic heart failure.  Echo report from 05/18/18 reviewed and showed an EF of 10-15%. Echo done 01/02/18 reviewed and showed an EF of 20-25% along with moderate Kent, trivial TR and PA pressure of 22 mm Hg. Echo done 10/24/16 which showed an EF of 15-20%. EF has declined from previous echo which was done 03/28/16 which showed an EF of 20-25% without regurgitation. Last catheterization was done March 2016.  Has not been admitted or been in the ED in the last 6 months.   He presents today for a follow-up visit with a chief complaint of    Past Medical History:  Diagnosis Date  . CHF (congestive heart failure) (HCC)   . Chronic systolic heart failure (HCC)    a. 10/2012 Echo: EF <20%, mod to sev dil LV, diast dysfxn, mildly dil LA/RA, mod to severe Kent/TR.  [name] Coronary artery disease, non-occlusive    a. 09/2014 Cath: minor luminal irregularities with severely dilated left ventricle. LVEDP 30.  [name] H/O noncompliance with medical treatment, presenting hazards to health   . History of traumatic injury of head   . Hyperlipidemia   . Hypertension   . Morbid obesity (HCC)   . NICM (nonischemic cardiomyopathy) (HCC)    a. 10/2012 Echo: EF <20%.  . Polysubstance abuse (HCC)    a. cocaine, tobacco, THC, ETOH  . Seizure disorder (HCC)   . Stroke [facility])    Past Surgical History:  Procedure Laterality Date  . CARDIAC CATHETERIZATION     [facility]  . ICD IMPLANT N/A 12/08/2016   Procedure: ICD Implant;  Surgeon: Duke Salvia, MD;  Location: [facility] INVASIVE CV LAB;  Service: Cardiovascular;  Laterality: N/A;   Family History  Problem Relation Age of Onset  . Diabetes Mother   . Brain cancer Mother   . Heart attack Mother   .  Sleep apnea Mother   . Heart failure Mother   . Hypertension Father   . Diabetes Father   . Heart disease Father   . Hyperlipidemia Father   . Heart attack Father   . Heart failure Father   . Sleep apnea Father   . Sleep apnea Sister   . Sleep apnea Brother   . Heart failure Maternal Aunt    Social History   Tobacco Use  . Smoking status: Former Smoker    Types: Cigars    Quit date: 01/02/2018    Years since quitting: 1.2  . Smokeless tobacco: Former Neurosurgeon  . Tobacco comment: smokes ~10 black and milds per week  Substance Use Topics  . Alcohol use: Yes    Comment: 8-9 beers a month per pt   Allergies  Allergen Reactions  . Tomato Rash     Review of Systems  Constitutional: Negative for appetite change and fatigue.  HENT: Negative for congestion, postnasal drip and sore throat.   Eyes: Negative.   Respiratory: Negative for cough, chest tightness, shortness of breath and wheezing.   Cardiovascular: Positive for chest pain ("chronic angina"). Negative for palpitations and leg swelling.  Gastrointestinal: Negative for abdominal distention and abdominal pain.  Endocrine: Negative.   Genitourinary: Negative.   Musculoskeletal: Negative for back pain and neck  pain.  Skin: Negative.   Allergic/Immunologic: Negative.   Neurological: Negative for dizziness, light-headedness and numbness.  Hematological: Negative for adenopathy. Does not bruise/bleed easily.  Psychiatric/Behavioral: Negative for dysphoric mood and sleep disturbance (slept with CPAP last night for 9 hours). The patient is not nervous/anxious.     Physical Exam  Constitutional: He is oriented to person, place, and time. He appears well-developed and well-nourished.  HENT:  Head: Normocephalic and atraumatic.  Neck: Normal range of motion. Neck supple. No JVD present.  Cardiovascular: Normal rate and regular rhythm.  Pulmonary/Chest: Effort normal. He has no wheezes. He has no rales.  Abdominal: Soft. He  exhibits no distension. There is no abdominal tenderness.  Musculoskeletal:        General: No tenderness or edema.  Neurological: He is alert and oriented to person, place, and time.  Skin: Skin is warm and dry.  Psychiatric: He has a normal mood and affect. His behavior is normal. Thought content normal.  Nursing note and vitals reviewed.   Assessment & Plan:  1: Chronic heart failure with reduced ejection fraction- - NYHA Class I - euvolemic today - continue weighing daily; To call for an overnight weight gain of >2 pounds or a weekly weight gain of >5 pounds. - weight up 2 pounds from last visit 9 months ago - Using Mrs. Dash and trying to follow a 2000mg  sodium diet - riding stationary bike  - had telemedicine visit with cardiologist Fletcher [name]) 11/26/2018 - saw pulmonologist Ashby Dawes) 01/25/18 - saw EP [name]) 03/02/18 - BNP 02/12/16 was 403.8  2: HTN- - BP looks good today - saw PCP Jerene Pitch) 09/22/2018 - BMP 07/12/2018 reviewed and showed sodium 141, potassium 3.7, creatinine 1.49 and GFR 63  3: Diabetes- - checking glucose weekly and says that it's been "ok" - A1c 07/12/2018 was 6.3%  4: Tobacco use- - has smoked infrequently since last here - drinks ~ 3-4 beers every few days - complete cessation discussed for 3 minutes with him  Patient did not bring his medications nor a list. Each medication was verbally reviewed with the patient and he was encouraged to bring the bottles to every visit to confirm accuracy of list.  Return in 6 weeks or sooner for any questions/problems before then.

## 2019-04-05 ENCOUNTER — Encounter: Payer: Self-pay | Admitting: Cardiology

## 2019-04-05 NOTE — Progress Notes (Signed)
Remote ICD transmission.   

## 2019-04-06 ENCOUNTER — Ambulatory Visit: Payer: Medicare Other | Admitting: Family

## 2019-04-10 ENCOUNTER — Other Ambulatory Visit: Payer: Self-pay | Admitting: Family Medicine

## 2019-04-10 DIAGNOSIS — E1121 Type 2 diabetes mellitus with diabetic nephropathy: Secondary | ICD-10-CM

## 2019-04-13 ENCOUNTER — Ambulatory Visit: Payer: Medicare Other | Attending: Family | Admitting: Family

## 2019-04-13 ENCOUNTER — Other Ambulatory Visit: Payer: Self-pay

## 2019-04-13 ENCOUNTER — Encounter: Payer: Self-pay | Admitting: Family

## 2019-04-13 VITALS — BP 140/84 | HR 96 | Resp 18 | Ht 72.0 in | Wt 300.4 lb

## 2019-04-13 DIAGNOSIS — Z8249 Family history of ischemic heart disease and other diseases of the circulatory system: Secondary | ICD-10-CM | POA: Diagnosis not present

## 2019-04-13 DIAGNOSIS — Z7901 Long term (current) use of anticoagulants: Secondary | ICD-10-CM | POA: Insufficient documentation

## 2019-04-13 DIAGNOSIS — G40909 Epilepsy, unspecified, not intractable, without status epilepticus: Secondary | ICD-10-CM | POA: Diagnosis not present

## 2019-04-13 DIAGNOSIS — Z6841 Body Mass Index (BMI) 40.0 and over, adult: Secondary | ICD-10-CM | POA: Insufficient documentation

## 2019-04-13 DIAGNOSIS — N182 Chronic kidney disease, stage 2 (mild): Secondary | ICD-10-CM | POA: Insufficient documentation

## 2019-04-13 DIAGNOSIS — I130 Hypertensive heart and chronic kidney disease with heart failure and stage 1 through stage 4 chronic kidney disease, or unspecified chronic kidney disease: Secondary | ICD-10-CM | POA: Diagnosis not present

## 2019-04-13 DIAGNOSIS — Z95810 Presence of automatic (implantable) cardiac defibrillator: Secondary | ICD-10-CM | POA: Insufficient documentation

## 2019-04-13 DIAGNOSIS — E119 Type 2 diabetes mellitus without complications: Secondary | ICD-10-CM | POA: Diagnosis not present

## 2019-04-13 DIAGNOSIS — E785 Hyperlipidemia, unspecified: Secondary | ICD-10-CM | POA: Insufficient documentation

## 2019-04-13 DIAGNOSIS — Z8673 Personal history of transient ischemic attack (TIA), and cerebral infarction without residual deficits: Secondary | ICD-10-CM | POA: Insufficient documentation

## 2019-04-13 DIAGNOSIS — Z79899 Other long term (current) drug therapy: Secondary | ICD-10-CM | POA: Insufficient documentation

## 2019-04-13 DIAGNOSIS — Z87891 Personal history of nicotine dependence: Secondary | ICD-10-CM | POA: Diagnosis not present

## 2019-04-13 DIAGNOSIS — E1122 Type 2 diabetes mellitus with diabetic chronic kidney disease: Secondary | ICD-10-CM | POA: Diagnosis not present

## 2019-04-13 DIAGNOSIS — I5022 Chronic systolic (congestive) heart failure: Secondary | ICD-10-CM | POA: Diagnosis not present

## 2019-04-13 DIAGNOSIS — I2510 Atherosclerotic heart disease of native coronary artery without angina pectoris: Secondary | ICD-10-CM | POA: Diagnosis not present

## 2019-04-13 DIAGNOSIS — I10 Essential (primary) hypertension: Secondary | ICD-10-CM

## 2019-04-13 DIAGNOSIS — E1121 Type 2 diabetes mellitus with diabetic nephropathy: Secondary | ICD-10-CM

## 2019-04-13 DIAGNOSIS — Z9119 Patient's noncompliance with other medical treatment and regimen: Secondary | ICD-10-CM | POA: Diagnosis not present

## 2019-04-13 DIAGNOSIS — I110 Hypertensive heart disease with heart failure: Secondary | ICD-10-CM | POA: Diagnosis present

## 2019-04-13 NOTE — Patient Instructions (Addendum)
Continue weighing daily and call for an overnight weight gain of >2 pounds or a weekly weight gain of >5 pounds.  Increase entresto to 97/103mg  twice a day. You may take 2 pills of your current bottle twice a day until you finish this bottle. Then take 1 pill twice a day of new bottle.

## 2019-04-13 NOTE — Progress Notes (Signed)
Patient ID: Kent Stanley., male    DOB: May 29, 1971, 48 y.o.   MRN: [id number]   Mr Turnley is a 48 y/o male with a history of stroke, CKD stage 2, CAD, hyperlipidemia, HTN, morbid obesity, polysubstance abuse in the past, prior tobacco use, seizures, DM, ICD implantation and chronic heart failure.  Echo report from 05/18/18 reviewed and showed an EF of 10-15%. Echo done 01/02/18 reviewed and showed an EF of 20-25% along with moderate MR, trivial TR and PA pressure of 22 mm Hg. Echo done 10/24/16 which showed an EF of 15-20%. EF has declined from previous echo which was done 03/28/16 which showed an EF of 20-25% without regurgitation. Last catheterization was done March 2016.  Has not been admitted or been in the ED in the last 6 months.   He presents today for a follow-up visit with a chief complaint of intermittent "chronic angina." He denies current chest pain, fatigue, cough, abdominal distention, dizziness, light-headedness, and trouble sleeping. He weighs himself every day and he states he gained back all the weight he lost recently since the gyms were closed. He is hoping to start exercising regularly again. He does not add salt to foods.   Past Medical History:  Diagnosis Date  . CHF (congestive heart failure) ([place])   . Chronic systolic heart failure ([place])    a. 10/2012 Echo: EF <20%, mod to sev dil LV, diast dysfxn, mildly dil LA/RA, mod to severe MR/TR.  [name] Coronary artery disease, non-occlusive    a. 09/2014 Cath: minor luminal irregularities with severely dilated left ventricle. LVEDP 30.  [name] H/O noncompliance with medical treatment, presenting hazards to health   . History of traumatic injury of head   . Hyperlipidemia   . Hypertension   . Morbid obesity ([place])   . NICM (nonischemic cardiomyopathy) ([place])    a. 10/2012 Echo: EF <20%.  . Polysubstance abuse ([place])    a. cocaine, tobacco, THC, ETOH  . Seizure disorder ([place])   . Stroke [facility])    Past Surgical History:  Procedure  Laterality Date  . CARDIAC CATHETERIZATION     [facility]  . ICD IMPLANT N/A 12/08/2016   Procedure: ICD Implant;  Surgeon: Deboraha Sprang, MD;  Location: [place] CV LAB;  Service: Cardiovascular;  Laterality: N/A;   Family History  Problem Relation Age of Onset  . Diabetes Mother   . Brain cancer Mother   . Heart attack Mother   . Sleep apnea Mother   . Heart failure Mother   . Hypertension Father   . Diabetes Father   . Heart disease Father   . Hyperlipidemia Father   . Heart attack Father   . Heart failure Father   . Sleep apnea Father   . Sleep apnea Sister   . Sleep apnea Brother   . Heart failure Maternal Aunt    Social History   Tobacco Use  . Smoking status: Former Smoker    Types: Cigars    Quit date: 01/02/2018    Years since quitting: 1.2  . Smokeless tobacco: Former Systems developer  . Tobacco comment: smokes ~10 black and milds per week  Substance Use Topics  . Alcohol use: Yes    Comment: 8-9 beers a month per pt   Allergies  Allergen Reactions  . Tomato Rash   Prior to Admission medications   Medication Sig Start Date End Date Taking? Authorizing Provider  ACCU-CHEK AVIVA PLUS test strip Check blood sugar up to 1 x  daily 01/18/18  Yes Karamalegos, [name], DO  ACCU-CHEK SOFTCLIX LANCETS lancets Check blood sugar up to 1 x daily 01/18/18  Yes Karamalegos, [name], DO  blood glucose meter kit and supplies KIT Dispense based on patient and insurance preference. Check blood glucose once daily. 08/21/15  Yes [name], Amy Mirenda Baltazar, NP  Blood Glucose Monitoring Suppl (ACCU-CHEK AVIVA PLUS) w/Device KIT Check blood sugar up to 1 x daily 01/18/18  Yes Karamalegos, [name], DO  ELIQUIS 5 MG TABS tablet Take 1 tablet by mouth twice daily 03/08/19  Yes Deboraha Sprang, MD  ENTRESTO 49-51 MG take 1 tablet by mouth twice a day 11/25/16  Yes [name], FNP  furosemide (LASIX) 40 MG tablet TAKE ONE TABLET BY MOUTH IN THE MORNING. TAKE ONE-HALF TABLET BY MOUTH IN THE EVENING  01/25/19  Yes Karamalegos, [name], DO  metoprolol succinate (TOPROL-XL) 25 MG 24 hr tablet Take 1 tablet (25 mg total) by mouth daily. 10/18/18  Yes Hackney, [name], FNP  OZEMPIC, 0.25 OR 0.5 MG/DOSE, 2 MG/1.5ML SOPN INJECT 0.5 MG INTO THE SKIN ONCE A WEEK. 04/11/19  Yes Karamalegos, [name], DO  pravastatin (PRAVACHOL) 40 MG tablet Take 2 tablets (80 mg total) by mouth daily. 10/18/18  Yes Hackney, [name], FNP  sildenafil (REVATIO) 20 MG tablet TAKE FIVE TABLETS BY MOUTH AS NEEDED. TAKE ABOUT 30 MINUTES PRIOR TO SEX. STOP TAKING IF CHEST PAIN. DO NOT TAKE WITH NITRO 02/03/19  Yes Karamalegos, [name], DO  spironolactone (ALDACTONE) 25 MG tablet Take 0.5 tablets (12.5 mg total) by mouth daily. 07/13/18  Yes Karamalegos, [name], DO    Review of Systems  Constitutional: Negative for appetite change and fatigue.  HENT: Negative for congestion, postnasal drip and sore throat.   Eyes: Negative.   Respiratory: Negative for cough, chest tightness, shortness of breath and wheezing.   Cardiovascular: Positive for chest pain ("chronic angina"). Negative for palpitations and leg swelling.  Gastrointestinal: Negative for abdominal distention and abdominal pain.  Endocrine: Negative.   Genitourinary: Negative.   Musculoskeletal: Negative for back pain and neck pain.  Skin: Negative.   Allergic/Immunologic: Negative.   Neurological: Negative for dizziness, light-headedness and numbness.  Hematological: Negative for adenopathy. Does not bruise/bleed easily.  Psychiatric/Behavioral: Negative for dysphoric mood and sleep disturbance (uses CPAP). The patient is not nervous/anxious.    Vitals:   04/13/19 1351  BP: 140/84  Pulse: 96  Resp: 18  SpO2: 99%  Weight: (!) 300 lb 6 oz (136.2 kg)  Height: 6' (1.829 m)   Wt Readings from Last 3 Encounters:  04/13/19 (!) 300 lb 6 oz (136.2 kg)  02/10/19 297 lb 6 oz (134.9 kg)  11/26/18 287 lb (130.2 kg)   Lab Results  Component Value Date    CREATININE 1.49 (H) 07/12/2018   CREATININE 1.56 (H) 05/18/2018   CREATININE 1.58 (H) 03/30/2018    Physical Exam  Constitutional: He is oriented to person, place, and time. He appears well-developed and well-nourished.  HENT:  Head: Normocephalic and atraumatic.  Neck: Normal range of motion. Neck supple. No JVD present.  Cardiovascular: Normal rate and regular rhythm.  Pulmonary/Chest: Effort normal. He has no wheezes. He has no rales.  Abdominal: Soft. He exhibits no distension. There is no abdominal tenderness.  Musculoskeletal:        General: No tenderness or edema.  Neurological: He is alert and oriented to person, place, and time.  Skin: Skin is warm and dry.  Psychiatric: He  has a normal mood and affect. His behavior is normal. Thought content normal.  Nursing note and vitals reviewed.   Assessment & Plan:  1: Chronic heart failure with reduced ejection fraction- - NYHA Class I - euvolemic today - continue weighing daily; To call for an overnight weight gain of >2 pounds or a weekly weight gain of >5 pounds. - weight up 3 pounds from last visit 2 months ago - Using Mrs. Dash and trying to follow a '2000mg'$  sodium diet - increase entresto to 97-'103mg'$  twice a day. He will take 2 tablets twice a day until he finishes this bottle then go down to 1 tablet twice a day with his new bottle.  - entresto assistance form filled out today.  - will check a BMP next visit - had telemedicine visit with cardiologist Fletcher [name]) 11/26/2018 - saw pulmonologist Ashby Dawes) 01/25/18 - saw EP [name]) 03/02/18 - BNP 02/12/16 was 403.8  2: HTN- - BP looks good today - saw PCP Jerene Pitch) 09/22/2018 - BMP 07/12/2018 reviewed and showed sodium 141, potassium 3.7, creatinine 1.49 and GFR 63  3: Diabetes- - blood sugar has been running 110s at home - A1c 07/12/2018 was 6.3%   Patient did not bring his medications nor a list. Each medication was verbally reviewed with the patient and he was  encouraged to bring the bottles to every visit to confirm accuracy of list.  Return in 4 weeks or sooner for any questions/problems before then.

## 2019-05-04 DIAGNOSIS — G4733 Obstructive sleep apnea (adult) (pediatric): Secondary | ICD-10-CM

## 2019-05-09 NOTE — Progress Notes (Deleted)
Patient ID: Kent Stanley., male    DOB: 1970/10/29, 48 y.o.   MRN: [id number]   Mr Rogacki is a 48 y/o male with a history of stroke, CKD stage 2, CAD, hyperlipidemia, HTN, morbid obesity, polysubstance abuse in the past, prior tobacco use, seizures, DM, ICD implantation and chronic heart failure.  Echo report from 05/18/18 reviewed and showed an EF of 10-15%. Echo done 01/02/18 reviewed and showed an EF of 20-25% along with moderate MR, trivial TR and PA pressure of 22 mm Hg. Echo done 10/24/16 which showed an EF of 15-20%. EF has declined from previous echo which was done 03/28/16 which showed an EF of 20-25% without regurgitation. Last catheterization was done March 2016.  Has not been admitted or been in the ED in the last 6 months.   He presents today for a follow-up visit with a chief complaint of  Past Medical History:  Diagnosis Date  . CHF (congestive heart failure) ([place])   . Chronic systolic heart failure ([place])    a. 10/2012 Echo: EF <20%, mod to sev dil LV, diast dysfxn, mildly dil LA/RA, mod to severe MR/TR.  [name] Coronary artery disease, non-occlusive    a. 09/2014 Cath: minor luminal irregularities with severely dilated left ventricle. LVEDP 30.  [name] H/O noncompliance with medical treatment, presenting hazards to health   . History of traumatic injury of head   . Hyperlipidemia   . Hypertension   . Morbid obesity ([place])   . NICM (nonischemic cardiomyopathy) ([place])    a. 10/2012 Echo: EF <20%.  . Polysubstance abuse ([place])    a. cocaine, tobacco, THC, ETOH  . Seizure disorder ([place])   . Stroke [facility])    Past Surgical History:  Procedure Laterality Date  . CARDIAC CATHETERIZATION     [facility]  . ICD IMPLANT N/A 12/08/2016   Procedure: ICD Implant;  Surgeon: Deboraha Sprang, MD;  Location: [place] CV LAB;  Service: Cardiovascular;  Laterality: N/A;   Family History  Problem Relation Age of Onset  . Diabetes Mother   . Brain cancer Mother   . Heart attack Mother   . Sleep  apnea Mother   . Heart failure Mother   . Hypertension Father   . Diabetes Father   . Heart disease Father   . Hyperlipidemia Father   . Heart attack Father   . Heart failure Father   . Sleep apnea Father   . Sleep apnea Sister   . Sleep apnea Brother   . Heart failure Maternal Aunt    Social History   Tobacco Use  . Smoking status: Former Smoker    Types: Cigars    Quit date: 01/02/2018    Years since quitting: 1.3  . Smokeless tobacco: Former Systems developer  . Tobacco comment: smokes ~10 black and milds per week  Substance Use Topics  . Alcohol use: Yes    Comment: 8-9 beers a month per pt   Allergies  Allergen Reactions  . Tomato Rash     Review of Systems  Constitutional: Negative for appetite change and fatigue.  HENT: Negative for congestion, postnasal drip and sore throat.   Eyes: Negative.   Respiratory: Negative for cough, chest tightness, shortness of breath and wheezing.   Cardiovascular: Positive for chest pain ("chronic angina"). Negative for palpitations and leg swelling.  Gastrointestinal: Negative for abdominal distention and abdominal pain.  Endocrine: Negative.   Genitourinary: Negative.   Musculoskeletal: Negative for back pain and neck pain.  Skin:  Negative.   Allergic/Immunologic: Negative.   Neurological: Negative for dizziness, light-headedness and numbness.  Hematological: Negative for adenopathy. Does not bruise/bleed easily.  Psychiatric/Behavioral: Negative for dysphoric mood and sleep disturbance (uses CPAP). The patient is not nervous/anxious.     Physical Exam  Constitutional: He is oriented to person, place, and time. He appears well-developed and well-nourished.  HENT:  Head: Normocephalic and atraumatic.  Neck: Normal range of motion. Neck supple. No JVD present.  Cardiovascular: Normal rate and regular rhythm.  Pulmonary/Chest: Effort normal. He has no wheezes. He has no rales.  Abdominal: Soft. He exhibits no distension. There is no  abdominal tenderness.  Musculoskeletal:        General: No tenderness or edema.  Neurological: He is alert and oriented to person, place, and time.  Skin: Skin is warm and dry.  Psychiatric: He has a normal mood and affect. His behavior is normal. Thought content normal.  Nursing note and vitals reviewed.   Assessment & Plan:  1: Chronic heart failure with reduced ejection fraction- - NYHA Class I - euvolemic today - continue weighing daily; To call for an overnight weight gain of >2 pounds or a weekly weight gain of >5 pounds. - weight up 3 pounds from last visit 2 months ago - Using Mrs. Dash and trying to follow a 2000mg  sodium diet - check BMP today since entresto increased to 97-103mg  BID last visit. - had telemedicine visit with cardiologist ) 11/26/2018 - saw pulmonologist 11/28/2018) 01/25/18 - saw EP 01/27/18) 03/02/18 - BNP 02/12/16 was 403.8  2: HTN- - BP looks good today - saw PCP 04/13/16) 09/22/2018 - BMP 07/12/2018 reviewed and showed sodium 141, potassium 3.7, creatinine 1.49 and GFR 63  3: Diabetes- - blood sugar has been running 110s at home - A1c 07/12/2018 was 6.3%   Patient did not bring his medications nor a list. Each medication was verbally reviewed with the patient and he was encouraged to bring the bottles to every visit to confirm accuracy of list.  Return in 4 weeks or sooner for any questions/problems before then.

## 2019-05-11 ENCOUNTER — Ambulatory Visit: Payer: Medicare Other | Admitting: Family

## 2019-05-12 DIAGNOSIS — J069 Acute upper respiratory infection, unspecified: Secondary | ICD-10-CM | POA: Diagnosis not present

## 2019-05-12 DIAGNOSIS — J029 Acute pharyngitis, unspecified: Secondary | ICD-10-CM | POA: Diagnosis not present

## 2019-05-12 DIAGNOSIS — Z20828 Contact with and (suspected) exposure to other viral communicable diseases: Secondary | ICD-10-CM | POA: Diagnosis not present

## 2019-05-12 DIAGNOSIS — R6883 Chills (without fever): Secondary | ICD-10-CM | POA: Diagnosis not present

## 2019-05-16 ENCOUNTER — Other Ambulatory Visit: Payer: Self-pay

## 2019-05-16 DIAGNOSIS — R059 Cough, unspecified: Secondary | ICD-10-CM

## 2019-05-16 DIAGNOSIS — R05 Cough: Secondary | ICD-10-CM

## 2019-05-16 MED ORDER — BENZONATATE 100 MG PO CAPS: 100.0000 mg | ORAL_CAPSULE | Freq: Three times a day (TID) | ORAL | 0 refills | Status: DC | PRN

## 2019-05-16 NOTE — Telephone Encounter (Signed)
Error

## 2019-05-23 ENCOUNTER — Ambulatory Visit: Payer: Medicare Other | Admitting: Family

## 2019-05-23 ENCOUNTER — Other Ambulatory Visit: Payer: Self-pay | Admitting: Family Medicine

## 2019-05-23 DIAGNOSIS — N5201 Erectile dysfunction due to arterial insufficiency: Secondary | ICD-10-CM

## 2019-05-27 DIAGNOSIS — J209 Acute bronchitis, unspecified: Secondary | ICD-10-CM | POA: Diagnosis not present

## 2019-06-09 ENCOUNTER — Telehealth: Payer: Self-pay | Admitting: Family Medicine

## 2019-06-09 NOTE — Telephone Encounter (Signed)
I left a message asking the patient to call and schedule AWV-I with Tiffany. VDM (DD) °

## 2019-06-20 ENCOUNTER — Other Ambulatory Visit: Payer: Self-pay | Admitting: Internal Medicine

## 2019-06-21 NOTE — Telephone Encounter (Signed)
20m Scr 1.49 07/12/18 136.2kg Lovw/arida 11/26/18

## 2019-06-27 ENCOUNTER — Ambulatory Visit (INDEPENDENT_AMBULATORY_CARE_PROVIDER_SITE_OTHER): Payer: Medicare Other | Admitting: *Deleted

## 2019-06-27 DIAGNOSIS — I5022 Chronic systolic (congestive) heart failure: Secondary | ICD-10-CM

## 2019-06-27 LAB — CUP PACEART REMOTE DEVICE CHECK
Battery Remaining Longevity: 121 mo
Battery Voltage: 3 V
Brady Statistic RV Percent Paced: 0.01 %
Date Time Interrogation Session: 20201221001704
HighPow Impedance: 80 Ohm
Implantable Lead Implant Date: 20180604
Implantable Lead Location: 753860
Implantable Lead Model: 293
Implantable Lead Serial Number: 433424
Implantable Pulse Generator Implant Date: 20180604
Lead Channel Impedance Value: 342 Ohm
Lead Channel Impedance Value: 342 Ohm
Lead Channel Pacing Threshold Amplitude: 2.125 V
Lead Channel Pacing Threshold Pulse Width: 0.4 ms
Lead Channel Sensing Intrinsic Amplitude: 9.5 mV
Lead Channel Sensing Intrinsic Amplitude: 9.5 mV
Lead Channel Setting Pacing Amplitude: 3.5 V
Lead Channel Setting Pacing Pulse Width: 0.8 ms
Lead Channel Setting Sensing Sensitivity: 0.3 mV

## 2019-07-15 DIAGNOSIS — Z131 Encounter for screening for diabetes mellitus: Secondary | ICD-10-CM | POA: Diagnosis not present

## 2019-07-15 DIAGNOSIS — I208 Other forms of angina pectoris: Secondary | ICD-10-CM | POA: Diagnosis not present

## 2019-07-15 DIAGNOSIS — I480 Paroxysmal atrial fibrillation: Secondary | ICD-10-CM | POA: Diagnosis not present

## 2019-07-15 DIAGNOSIS — Z1159 Encounter for screening for other viral diseases: Secondary | ICD-10-CM | POA: Diagnosis not present

## 2019-07-15 DIAGNOSIS — Z79899 Other long term (current) drug therapy: Secondary | ICD-10-CM | POA: Diagnosis not present

## 2019-07-15 DIAGNOSIS — Z1211 Encounter for screening for malignant neoplasm of colon: Secondary | ICD-10-CM | POA: Diagnosis not present

## 2019-07-15 DIAGNOSIS — G4730 Sleep apnea, unspecified: Secondary | ICD-10-CM | POA: Diagnosis not present

## 2019-07-15 DIAGNOSIS — I428 Other cardiomyopathies: Secondary | ICD-10-CM | POA: Diagnosis not present

## 2019-07-15 DIAGNOSIS — E118 Type 2 diabetes mellitus with unspecified complications: Secondary | ICD-10-CM | POA: Diagnosis not present

## 2019-07-15 DIAGNOSIS — I5022 Chronic systolic (congestive) heart failure: Secondary | ICD-10-CM | POA: Diagnosis not present

## 2019-07-15 DIAGNOSIS — Z95810 Presence of automatic (implantable) cardiac defibrillator: Secondary | ICD-10-CM | POA: Diagnosis not present

## 2019-07-15 DIAGNOSIS — Z6841 Body Mass Index (BMI) 40.0 and over, adult: Secondary | ICD-10-CM | POA: Diagnosis not present

## 2019-07-15 DIAGNOSIS — E785 Hyperlipidemia, unspecified: Secondary | ICD-10-CM | POA: Diagnosis not present

## 2019-07-15 DIAGNOSIS — I422 Other hypertrophic cardiomyopathy: Secondary | ICD-10-CM | POA: Diagnosis not present

## 2019-07-15 DIAGNOSIS — I252 Old myocardial infarction: Secondary | ICD-10-CM | POA: Diagnosis not present

## 2019-07-15 DIAGNOSIS — I25118 Atherosclerotic heart disease of native coronary artery with other forms of angina pectoris: Secondary | ICD-10-CM | POA: Diagnosis not present

## 2019-07-15 DIAGNOSIS — I5020 Unspecified systolic (congestive) heart failure: Secondary | ICD-10-CM | POA: Diagnosis not present

## 2019-07-15 DIAGNOSIS — E1122 Type 2 diabetes mellitus with diabetic chronic kidney disease: Secondary | ICD-10-CM | POA: Diagnosis not present

## 2019-07-15 DIAGNOSIS — N182 Chronic kidney disease, stage 2 (mild): Secondary | ICD-10-CM | POA: Diagnosis not present

## 2019-07-15 DIAGNOSIS — Z7984 Long term (current) use of oral hypoglycemic drugs: Secondary | ICD-10-CM | POA: Diagnosis not present

## 2019-07-15 DIAGNOSIS — Z8673 Personal history of transient ischemic attack (TIA), and cerebral infarction without residual deficits: Secondary | ICD-10-CM | POA: Diagnosis not present

## 2019-07-15 DIAGNOSIS — Z125 Encounter for screening for malignant neoplasm of prostate: Secondary | ICD-10-CM | POA: Diagnosis not present

## 2019-07-15 DIAGNOSIS — I130 Hypertensive heart and chronic kidney disease with heart failure and stage 1 through stage 4 chronic kidney disease, or unspecified chronic kidney disease: Secondary | ICD-10-CM | POA: Diagnosis not present

## 2019-07-15 DIAGNOSIS — J387 Other diseases of larynx: Secondary | ICD-10-CM | POA: Diagnosis not present

## 2019-07-15 DIAGNOSIS — Z23 Encounter for immunization: Secondary | ICD-10-CM | POA: Diagnosis not present

## 2019-07-15 DIAGNOSIS — Z7901 Long term (current) use of anticoagulants: Secondary | ICD-10-CM | POA: Diagnosis not present

## 2019-07-15 LAB — HEMOGLOBIN A1C: Hemoglobin A1C: 6.2

## 2019-08-01 ENCOUNTER — Other Ambulatory Visit: Payer: Self-pay | Admitting: Family Medicine

## 2019-08-01 DIAGNOSIS — I5022 Chronic systolic (congestive) heart failure: Secondary | ICD-10-CM

## 2019-09-22 ENCOUNTER — Other Ambulatory Visit: Payer: Self-pay | Admitting: Family

## 2019-09-22 DIAGNOSIS — E782 Mixed hyperlipidemia: Secondary | ICD-10-CM

## 2019-09-22 MED ORDER — PRAVASTATIN SODIUM 80 MG PO TABS: 80.0000 mg | ORAL_TABLET | Freq: Every day | ORAL | 0 refills | Status: DC

## 2019-09-24 ENCOUNTER — Other Ambulatory Visit: Payer: Self-pay | Admitting: Family Medicine

## 2019-09-24 DIAGNOSIS — R05 Cough: Secondary | ICD-10-CM

## 2019-09-24 DIAGNOSIS — N5201 Erectile dysfunction due to arterial insufficiency: Secondary | ICD-10-CM

## 2019-09-24 DIAGNOSIS — R059 Cough, unspecified: Secondary | ICD-10-CM

## 2019-09-26 ENCOUNTER — Ambulatory Visit (INDEPENDENT_AMBULATORY_CARE_PROVIDER_SITE_OTHER): Payer: Medicare Other | Admitting: *Deleted

## 2019-09-26 DIAGNOSIS — I5022 Chronic systolic (congestive) heart failure: Secondary | ICD-10-CM | POA: Diagnosis not present

## 2019-09-26 LAB — CUP PACEART REMOTE DEVICE CHECK
Battery Remaining Longevity: 118 mo
Battery Voltage: 3 V
Brady Statistic RV Percent Paced: 0.01 %
Date Time Interrogation Session: 20210322031709
HighPow Impedance: 65 Ohm
Implantable Lead Implant Date: 20180604
Implantable Lead Location: 753860
Implantable Lead Model: 293
Implantable Lead Serial Number: 433424
Implantable Pulse Generator Implant Date: 20180604
Lead Channel Impedance Value: 342 Ohm
Lead Channel Impedance Value: 342 Ohm
Lead Channel Pacing Threshold Amplitude: 2.25 V
Lead Channel Pacing Threshold Pulse Width: 0.4 ms
Lead Channel Sensing Intrinsic Amplitude: 7.5 mV
Lead Channel Sensing Intrinsic Amplitude: 7.5 mV
Lead Channel Setting Pacing Amplitude: 3.5 V
Lead Channel Setting Pacing Pulse Width: 0.8 ms
Lead Channel Setting Sensing Sensitivity: 0.3 mV

## 2019-09-27 NOTE — Progress Notes (Signed)
ICD Remote  

## 2019-10-04 ENCOUNTER — Other Ambulatory Visit: Payer: Self-pay | Admitting: Cardiovascular Disease

## 2019-10-04 NOTE — Telephone Encounter (Signed)
Refill Request.  

## 2019-10-31 ENCOUNTER — Other Ambulatory Visit: Payer: Self-pay | Admitting: Family Medicine

## 2019-10-31 ENCOUNTER — Other Ambulatory Visit: Payer: Self-pay

## 2019-10-31 DIAGNOSIS — N5201 Erectile dysfunction due to arterial insufficiency: Secondary | ICD-10-CM

## 2019-10-31 NOTE — Telephone Encounter (Signed)
Please let patient know:  Patient has not been seen here in >1 year. He has actually already established with new PCP Dr Derinda Sis through [facility].  He should contact that office to get further refills on Sildenafil.  Saralyn Pilar, DO [facility] 10/31/2019, 12:59 PM

## 2019-10-31 NOTE — Telephone Encounter (Signed)
Left message regarding reply for refill request.

## 2019-10-31 NOTE — Telephone Encounter (Signed)
Requested medication (s) are due for refill today:     Requested medication (s) are on the active medication list:   Yes  Future visit scheduled:   No   Last ordered: Not sure  Clinic note:   This is being returned because it does not look like this pt sees Dr. Althea Charon.  Pt sent a MyChart note on 10/29/2019 asking for this Rx to be refilled.   It looks like he is seeing Dr. Azalia Bilis with [facility].    Plus there is not a refill protocol for this medication.   Requested Prescriptions  Pending Prescriptions Disp Refills   sildenafil (REVATIO) 20 MG tablet 60 tablet 0    Sig: TAKE 5 TABLETS BY MOUTH AS NEEDED **TAKE ABOUT 30 MINUTES PRIOR TO SEX, STOP TAKING IF YOU EXPERIENCE CHEST PAIN. DO NOT TAKE WITH NITRO.**      There is no refill protocol information for this order

## 2019-10-31 NOTE — Telephone Encounter (Signed)
Patient requesting sildenafil (REVATIO) 20 MG tablet , patient states he contacted pharmacy on Wednesday (chart doesn't reflect request from pharmacy). Patient would like request expedited.    [facility] [address] [facility] [place] - [address] Phone:  [phone]  Fax:  [phone]

## 2019-10-31 NOTE — Telephone Encounter (Signed)
Pt called back, requesting call back from clinic. Has questions regarding his refill request  Best contact: [phone]

## 2019-11-01 ENCOUNTER — Encounter: Payer: Self-pay | Admitting: Family Medicine

## 2019-11-01 ENCOUNTER — Ambulatory Visit (INDEPENDENT_AMBULATORY_CARE_PROVIDER_SITE_OTHER): Payer: Medicare Other | Admitting: Family Medicine

## 2019-11-01 ENCOUNTER — Other Ambulatory Visit: Payer: Self-pay

## 2019-11-01 VITALS — BP 115/77 | HR 95 | Temp 98.0°F | Ht 72.0 in | Wt 296.4 lb

## 2019-11-01 DIAGNOSIS — Z1211 Encounter for screening for malignant neoplasm of colon: Secondary | ICD-10-CM | POA: Diagnosis not present

## 2019-11-01 DIAGNOSIS — J019 Acute sinusitis, unspecified: Secondary | ICD-10-CM | POA: Diagnosis not present

## 2019-11-01 DIAGNOSIS — N5201 Erectile dysfunction due to arterial insufficiency: Secondary | ICD-10-CM | POA: Diagnosis not present

## 2019-11-01 MED ORDER — SILDENAFIL CITRATE 20 MG PO TABS: ORAL_TABLET | ORAL | 3 refills | Status: DC

## 2019-11-01 MED ORDER — IPRATROPIUM BROMIDE 0.06 % NA SOLN: 2.0000 | Freq: Four times a day (QID) | NASAL | 0 refills | Status: AC

## 2019-11-01 MED ORDER — FLUTICASONE PROPIONATE 50 MCG/ACT NA SUSP: 2.0000 | Freq: Every day | NASAL | 3 refills | Status: AC

## 2019-11-01 NOTE — Progress Notes (Signed)
Subjective:    Patient ID: Kent Geralds., male    DOB: 05-30-1971, 49 y.o.   MRN: [id number]  Kent Seehafer. is a 49 y.o. male presenting on 11/01/2019 for Sinusitis (nasal congestion, retain fluid and chest congestion but denies chills or fever) and Erectile Dysfunction (needs refill)  Last visit with me was 1 year ago 09/2018. He had since relocated to [facility], but tried to establish with [facility] providers for PCP / specialist, however he prefers to keep his [facility] Cardiology team, and [place] as PCP, he is returning to care now. He may move closer to [facility]  HPI   Subacute Rhinosinusitis History of previous COVID Infection back in 05/2019. He reports course with COVID infection 05/2019, he said he had a false negative test but had severe symptoms. His wife is a Engineer, civil (consulting) works with [employer]. Never caught the infection. He has had some issues with persistent sinus congestion, he was treated with Flonase, he tried rx recently, but ran out.  Currently off nasal spray, he was on azelastine in past as well. Today has some persistent sinus congestion and pressure. Also some allergy symptoms Admits mucus production worse in AM Denies any dyspnea, fever chills chest pain edema  Erectile Dysfunction Chronic problem. Improved on Sildenafil 3-5 pills per dose PRN. Needs new rx.  Type 2 Diabetes, updates Last lab from [facility] provider 07/2019, see below, 6.2 He is on Entresto (ARB component) Followed by Cardiology He is due for routine diabetic care again. Needs Diabetic Eye Exam.  Health Maintenance: Colon CA Screening - Age 46+, asymptomatic. Requesting testing with Cologuard  Depression screen [facility] 2/9 09/22/2018 09/22/2018 07/12/2018  Decreased Interest 0 0 0  Down, Depressed, Hopeless 0 0 0  PHQ - 2 Score 0 0 0  Altered sleeping - - -  Tired, decreased energy - - -  Change in appetite - - -  Feeling bad or failure about yourself  - - -  Trouble concentrating - - -  Moving  slowly or fidgety/restless - - -  Suicidal thoughts - - -  PHQ-9 Score - - -  Difficult doing work/chores - - -    Social History   Tobacco Use  . Smoking status: Former Smoker    Types: Cigars    Quit date: 01/02/2018    Years since quitting: 1.8  . Smokeless tobacco: Former Neurosurgeon  . Tobacco comment: smokes ~10 black and milds per week  Substance Use Topics  . Alcohol use: Yes    Comment: 8-9 beers a month per pt  . Drug use: No    Review of Systems Per HPI unless specifically indicated above     Objective:    BP 115/77   Pulse 95   Temp 98 F (36.7 C) (Oral)   Ht 6' (1.829 m)   Wt 296 lb 6.4 oz (134.4 kg)   SpO2 97%   BMI 40.20 kg/m   Wt Readings from Last 3 Encounters:  11/01/19 296 lb 6.4 oz (134.4 kg)  04/13/19 (!) 300 lb 6 oz (136.2 kg)  02/10/19 297 lb 6 oz (134.9 kg)    Physical Exam Vitals and nursing note reviewed.  Constitutional:      General: He is not in acute distress.    Appearance: He is well-developed. He is obese. He is not diaphoretic.     Comments: Well-appearing, comfortable, cooperative  HENT:     Head: Normocephalic and atraumatic.  Eyes:  General:        Right eye: No discharge.        Left eye: No discharge.     Conjunctiva/sclera: Conjunctivae normal.  Neck:     Thyroid: No thyromegaly.  Cardiovascular:     Rate and Rhythm: Normal rate and regular rhythm.     Heart sounds: Normal heart sounds. No murmur.  Pulmonary:     Effort: Pulmonary effort is normal. No respiratory distress.     Breath sounds: Normal breath sounds. No wheezing or rales.     Comments: Good air movement. Musculoskeletal:        General: Normal range of motion.     Cervical back: Normal range of motion and neck supple.  Lymphadenopathy:     Cervical: No cervical adenopathy.  Skin:    General: Skin is warm and dry.     Findings: No erythema or rash.  Neurological:     Mental Status: He is alert and oriented to person, place, and time.  Psychiatric:         Behavior: Behavior normal.     Comments: Well groomed, good eye contact, normal speech and thoughts      Diabetic Foot Exam - Simple   Simple Foot Form Diabetic Foot exam was performed with the following findings: Yes 11/01/2019 10:18 AM  Visual Inspection No deformities, no ulcerations, no other skin breakdown bilaterally: Yes Sensation Testing Intact to touch and monofilament testing bilaterally: Yes Pulse Check Posterior Tibialis and Dorsalis pulse intact bilaterally: Yes Comments     Results for orders placed or performed in visit on 11/01/19  Hemoglobin A1c  Result Value Ref Range   Hemoglobin A1C 6.2       Assessment & Plan:   Problem List Items Addressed This Visit    Erectile dysfunction    Consistent with likely multifactorial ED secondary to vascular/neurogenic etiology in setting of significant DM, prior CVA, HLD, HTN, obesity. Not on NTG, only Entrestro   Plan: 1. Refill Sildenafil 20mg  tabs - take 3-5 for dose 60-100mg  PRN - #90 +3 refills sent to [facility], use goodrx - Reviewed risks on this medication given his chronic medical conditions, and reinforced absolutely contraindicated with NTG. If concerning symptoms needs to stop and seek immediate medical attention 2. Follow-up as needed - after Cardiology      Relevant Medications   sildenafil (REVATIO) 20 MG tablet    Other Visit Diagnoses    Acute rhinosinusitis    -  Primary   Relevant Medications   fluticasone (FLONASE) 50 MCG/ACT nasal spray   ipratropium (ATROVENT) 0.06 % nasal spray   Screening for colon cancer       Relevant Orders   Cologuard      #Sinusitis Clinically with allergic rhinosinusitis history, prolonged symptoms, without focal sign of bacterial infection Afebrile Seems worse following COVID19 05/2019 Reassurance Trial back on Flonase, for longer term relief Resume OTC anti histamine For symptom relief, Start Atrovent nasal spray decongestant 2 sprays in each  nostril up to 4 times daily for 7 days If not improving contact us within 24-48 hours consider oral antibiotic if indicated  #Colon CA Screening  Due for routine colon cancer screening. Never had colonoscopy (not interested), no family history colon cancer. - Discussion today about recommendations for either Colonoscopy or Cologuard screening, benefits and risks of screening, interested in Cologuard, understands that if positive then recommendation is for diagnostic colonoscopy to follow-up. - Ordered Cologuard today   Meds ordered this  encounter  Medications  . fluticasone (FLONASE) 50 MCG/ACT nasal spray    Sig: Place 2 sprays into both nostrils daily. Use for 4-6 weeks then stop and use seasonally or as needed.    Dispense:  16 g    Refill:  3  . ipratropium (ATROVENT) 0.06 % nasal spray    Sig: Place 2 sprays into both nostrils 4 (four) times daily. For congestion. For up to 5-7 days then stop.    Dispense:  15 mL    Refill:  0  . sildenafil (REVATIO) 20 MG tablet    Sig: Take 3 to 5 tablets by mouth as needed about 30 min prior to sex, stop taking if chest pain, do not take with nitroglycerin.    Dispense:  90 tablet    Refill:  3      Follow up plan: Return in about 3 months (around 01/31/2020) for 3 month follow-up DM - add other labs or A1c?[name]  May add other labs at that time, will evaluate at time of visit or can do A1c POC only.  Saralyn Pilar, DO [facility] 11/01/2019, 10:21 AM

## 2019-11-01 NOTE — Patient Instructions (Addendum)
Thank you for coming to the office today.  Recent Labs    07/15/19 0000  HGBA1C 6.2    Your provider would like to you have your annual eye exam. Please contact your current eye doctor or here are some good options for you to contact.   [facility]   Address: [address] [place], [place] [address] Phone: [phone]  Website: [email or web address]   [facility] [address], [place], [place] [address] Phone: [phone] [email or web address]  [facility]  Address: [address] [place], [place], [place] [address] Phone: [phone]   [facility] [address] [place], [place] [address] Phone: [phone]  [facility] Address: [address] [place], [place], [place] [address]  Phone: [phone]  Colon Cancer Screening: - For all adults age 54+ routine colon cancer screening is highly recommended.     - Recent guidelines from American Cancer Society recommend starting age of 52 - Early detection of colon cancer is important, because often there are no warning signs or symptoms, also if found early usually it can be cured. Late stage is hard to treat.  - If you are not interested in Colonoscopy screening (if done and normal you could be cleared for 5 to 10 years until next due), then Cologuard is an excellent alternative for screening test for Colon Cancer. It is highly sensitive for detecting DNA of colon cancer from even the earliest stages. Also, there is NO bowel prep required. - If Cologuard is NEGATIVE, then it is good for 3 years before next due - If Cologuard is POSITIVE, then it is strongly advised to get a Colonoscopy, which allows the GI doctor to locate the source of the cancer or polyp (even very early stage) and treat it by removing it. ------------------------- ORDERED COLOGUARD  Follow instructions to collect sample, you may call the company for any help or questions, 24/7 telephone support at [phone].    Please schedule a  Follow-up Appointment to: Return in about 3 months (around 01/31/2020) for 3 month follow-up DM - add other labs or A1c?.  If you have any other questions or concerns, please feel free to call the office or send a message through MyChart. You may also schedule an earlier appointment if necessary.  Additionally, you may be receiving a survey about your experience at our office within a few days to 1 week by e-mail or mail. We value your feedback.  Saralyn Pilar, DO [facility], [place]

## 2019-11-01 NOTE — Assessment & Plan Note (Signed)
Consistent with likely multifactorial ED secondary to vascular/neurogenic etiology in setting of significant DM, prior CVA, HLD, HTN, obesity. Not on NTG, only Entrestro   Plan: 1. Refill Sildenafil 20mg  tabs - take 3-5 for dose 60-100mg  PRN - #90 +3 refills sent to [facility], use goodrx - Reviewed risks on this medication given his chronic medical conditions, and reinforced absolutely contraindicated with NTG. If concerning symptoms needs to stop and seek immediate medical attention 2. Follow-up as needed - after Cardiology

## 2019-11-06 ENCOUNTER — Other Ambulatory Visit: Payer: Self-pay | Admitting: Family

## 2019-11-06 DIAGNOSIS — E782 Mixed hyperlipidemia: Secondary | ICD-10-CM

## 2019-11-07 ENCOUNTER — Other Ambulatory Visit: Payer: Self-pay | Admitting: Family Medicine

## 2019-11-07 DIAGNOSIS — E1121 Type 2 diabetes mellitus with diabetic nephropathy: Secondary | ICD-10-CM

## 2019-11-14 ENCOUNTER — Other Ambulatory Visit: Payer: Self-pay | Admitting: Family

## 2019-11-14 DIAGNOSIS — E782 Mixed hyperlipidemia: Secondary | ICD-10-CM

## 2019-11-15 ENCOUNTER — Other Ambulatory Visit: Payer: Self-pay | Admitting: Family

## 2019-11-15 DIAGNOSIS — E782 Mixed hyperlipidemia: Secondary | ICD-10-CM

## 2019-11-15 MED ORDER — PRAVASTATIN SODIUM 80 MG PO TABS: 80.0000 mg | ORAL_TABLET | Freq: Every day | ORAL | 0 refills | Status: DC

## 2019-11-24 ENCOUNTER — Ambulatory Visit: Payer: Medicare Other | Admitting: Family

## 2019-12-08 ENCOUNTER — Encounter: Payer: Self-pay | Admitting: Family

## 2019-12-09 ENCOUNTER — Other Ambulatory Visit: Payer: Self-pay | Admitting: Family Medicine

## 2019-12-09 DIAGNOSIS — I5022 Chronic systolic (congestive) heart failure: Secondary | ICD-10-CM

## 2019-12-09 NOTE — Telephone Encounter (Signed)
Attempted to call patient for f/u appt  around January 31, 2020 per notes of Dr. Althea Charon. On 11/01/19. For diabetes and labs

## 2019-12-12 ENCOUNTER — Other Ambulatory Visit: Payer: Self-pay

## 2019-12-12 DIAGNOSIS — I5022 Chronic systolic (congestive) heart failure: Secondary | ICD-10-CM

## 2019-12-14 MED ORDER — FUROSEMIDE 40 MG PO TABS: ORAL_TABLET | ORAL | 2 refills | Status: DC

## 2019-12-14 NOTE — Progress Notes (Signed)
Patient ID: Kent Kindred., male    DOB: 1970/12/13, 49 y.o.   MRN: [id number]   Kent Stanley is a 49 y/o male with a history of stroke, CKD stage 2, CAD, hyperlipidemia, HTN, morbid obesity, polysubstance abuse in the past, prior tobacco use, seizures, DM, ICD implantation and chronic heart failure.  Echo report from 05/18/18 reviewed and showed an EF of 10-15%. Echo done 01/02/18 reviewed and showed an EF of 20-25% along with moderate Kent, trivial TR and PA pressure of 22 mm Hg. Echo done 10/24/16 which showed an EF of 15-20%. EF has declined from previous echo which was done 03/28/16 which showed an EF of 20-25% without regurgitation. Last catheterization was done March 2016.  Has not been admitted or been in the ED in the last 6 months.   He presents today for a follow-up visit with a chief complaint of stable angina. He describes this as chronic in nature having been present for several years. He has associated left ankle swelling (due to plantar fasciitis) and left knee pain along with this. He denies any difficulty sleeping, dizziness, abdominal distention, palpitations, shortness of breath, cough, fatigue or weight gain.   Has recovered twice from COVID-19, once late fall last year and then again earlier this year. Currently wearing a boot on his left foot due to plantar fasciitis.   Past Medical History:  Diagnosis Date  . CHF (congestive heart failure) ([place])   . Chronic systolic heart failure ([place])    a. 10/2012 Echo: EF <20%, mod to sev dil LV, diast dysfxn, mildly dil LA/RA, mod to severe Kent/TR.  [name] Coronary artery disease, non-occlusive    a. 09/2014 Cath: minor luminal irregularities with severely dilated left ventricle. LVEDP 30.  [name] H/O noncompliance with medical treatment, presenting hazards to health   . History of traumatic injury of head   . Hyperlipidemia   . Hypertension   . Morbid obesity ([place])   . NICM (nonischemic cardiomyopathy) ([place])    a. 10/2012 Echo: EF <20%.  .  Polysubstance abuse ([place])    a. cocaine, tobacco, THC, ETOH  . Seizure disorder ([place])   . Stroke [facility])    Past Surgical History:  Procedure Laterality Date  . CARDIAC CATHETERIZATION     [facility]  . ICD IMPLANT N/A 12/08/2016   Procedure: ICD Implant;  Surgeon: Deboraha Sprang, MD;  Location: [facility];  Service: Cardiovascular;  Laterality: N/A;   Family History  Problem Relation Age of Onset  . Diabetes Mother   . Brain cancer Mother   . Heart attack Mother   . Sleep apnea Mother   . Heart failure Mother   . Hypertension Father   . Diabetes Father   . Heart disease Father   . Hyperlipidemia Father   . Heart attack Father   . Heart failure Father   . Sleep apnea Father   . Sleep apnea Sister   . Sleep apnea Brother   . Heart failure Maternal Aunt    Social History   Tobacco Use  . Smoking status: Former Smoker    Types: Cigars    Quit date: 01/02/2018    Years since quitting: 1.9  . Smokeless tobacco: Former Systems developer  . Tobacco comment: smokes ~10 black and milds per week  Substance Use Topics  . Alcohol use: Yes    Comment: 8-9 beers a month per pt   Allergies  Allergen Reactions  . Tomato Rash   Prior to Admission  medications   Medication Sig Start Date End Date Taking? Authorizing Provider  ACCU-CHEK AVIVA PLUS test strip Check blood sugar up to 1 x daily 01/18/18  Yes Karamalegos, [name], DO  ACCU-CHEK SOFTCLIX LANCETS lancets Check blood sugar up to 1 x daily 01/18/18  Yes Karamalegos, [name], DO  blood glucose meter kit and supplies KIT Dispense based on patient and insurance preference. Check blood glucose once daily. 08/21/15  Yes [name], Genevie Cheshire, NP  Blood Glucose Monitoring Suppl (ACCU-CHEK AVIVA PLUS) w/Device KIT Check blood sugar up to 1 x daily 01/18/18  Yes Karamalegos, [name], DO  ELIQUIS 5 MG TABS tablet Take 1 tablet by mouth twice daily 10/04/19  Yes Arida, Mertie Clause, MD  ENTRESTO 49-51 MG take 1 tablet by mouth twice a day Patient  taking differently: Take 1 tablet by mouth 2 (two) times daily.  11/25/16  Yes Creta Dorame A, FNP  fluticasone (FLONASE) 50 MCG/ACT nasal spray Place 2 sprays into both nostrils daily. Use for 4-6 weeks then stop and use seasonally or as needed. 11/01/19  Yes Karamalegos, [name], DO  furosemide (LASIX) 40 MG tablet TAKE 1 TABLET BY MOUTH IN THE MORNING AND 1/2 (ONE-HALF) IN THE EVENING 12/14/19  Yes Karamalegos, Alexander J, DO  ipratropium (ATROVENT) 0.06 % nasal spray Place 2 sprays into both nostrils 4 (four) times daily. For congestion. For up to 5-7 days then stop. 11/01/19  Yes Karamalegos, [name], DO  metoprolol succinate (TOPROL-XL) 25 MG 24 hr tablet Take 1 tablet (25 mg total) by mouth daily. 10/18/18  Yes Ridley Dileo A, FNP  OZEMPIC, 0.25 OR 0.5 MG/DOSE, 2 MG/1.5ML SOPN INJECT 0.5 MG SUBCUTANEOUSLY ONCE A WEEK 11/07/19  Yes Karamalegos, [name], DO  sildenafil (REVATIO) 20 MG tablet Take 3 to 5 tablets by mouth as needed about 30 min prior to sex, stop taking if chest pain, do not take with nitroglycerin. 11/01/19  Yes Karamalegos, [name], DO  spironolactone (ALDACTONE) 25 MG tablet Take 1/2 (one-half) tablet by mouth once daily 08/01/19  Yes Karamalegos, [name], DO  pravastatin (PRAVACHOL) 80 MG tablet Take 1 tablet (80 mg total) by mouth daily. MUST KEEP APPOINTMENT FOR FURTHER REFILLS Patient not taking: Reported on 12/15/2019 11/15/19   Alisa Graff, FNP    Review of Systems  Constitutional: Negative for appetite change and fatigue.  HENT: Negative for congestion, postnasal drip and sore throat.   Eyes: Negative.   Respiratory: Negative for cough, chest tightness, shortness of breath and wheezing.   Cardiovascular: Positive for chest pain ("chronic angina") and leg swelling (left ankle). Negative for palpitations.  Gastrointestinal: Negative for abdominal distention and abdominal pain.  Endocrine: Negative.   Genitourinary: Negative.   Musculoskeletal: Positive  for arthralgias (left knee). Negative for back pain and neck pain.  Skin: Negative.   Allergic/Immunologic: Negative.   Neurological: Negative for dizziness, light-headedness and numbness.  Hematological: Negative for adenopathy. Does not bruise/bleed easily.  Psychiatric/Behavioral: Negative for dysphoric mood and sleep disturbance (uses CPAP). The patient is not nervous/anxious.    Vitals:   12/15/19 1059  BP: (!) 153/110  Pulse: 92  Resp: 20  SpO2: 97%  Weight: 269 lb (122 kg)  Height: 6' (1.829 m)   Wt Readings from Last 3 Encounters:  12/15/19 269 lb (122 kg)  11/01/19 296 lb 6.4 oz (134.4 kg)  04/13/19 (!) 300 lb 6 oz (136.2 kg)   Lab Results  Component Value Date   CREATININE 1.49 (H) 07/12/2018  CREATININE 1.56 (H) 05/18/2018   CREATININE 1.58 (H) 03/30/2018     Physical Exam Vitals and nursing note reviewed.  Constitutional:      Appearance: He is well-developed and well-nourished.  HENT:     Head: Normocephalic and atraumatic.  Neck:     Vascular: No JVD.  Cardiovascular:     Rate and Rhythm: Normal rate and regular rhythm.  Pulmonary:     Effort: Pulmonary effort is normal.     Breath sounds: No wheezing or rales.  Abdominal:     General: There is no distension.     Palpations: Abdomen is soft.     Tenderness: There is no abdominal tenderness.  Musculoskeletal:        General: No tenderness or edema.     Cervical back: Normal range of motion and neck supple.     Comments: Boot on left foot  Skin:    General: Skin is warm and dry.  Neurological:     Mental Status: He is alert and oriented to person, place, and time.  Psychiatric:        Mood and Affect: Mood and affect normal.        Behavior: Behavior normal.        Thought Content: Thought content normal.     Assessment & Plan:  1: Chronic heart failure with reduced ejection fraction- - NYHA Class I - euvolemic today - weighing daily; reminded to call for an overnight weight gain of >2  pounds or a weekly weight gain of >5 pounds. - weight down 31 pounds from last visit 8 months ago (has had COVID twice) - Using Mrs. Dash and trying to follow a '2000mg'$  sodium diet - echo scheduled for 01/19/20; depending on results, may need to titrate medications - had telemedicine visit with cardiologist Fletcher [name]) 11/26/2018; f/u scheduled on 01/31/20 - saw pulmonologist Ashby Dawes) 01/25/18 - saw EP [name]) 03/02/18 - BNP 02/12/16 was 403.8  2: HTN- - BP elevated although he hasn't taken his medications yet today; lives in [place] so doesn't want to take them prior due to having to stop to urinate; will take them upon arrival to home and then take his second doses later today - saw PCP Jerene Pitch) 11/01/19 - BMP 07/12/2018 reviewed and showed sodium 141, potassium 3.7, creatinine 1.49 and GFR 63 - recheck BMP today  3: Diabetes- - fasting glucose in clinic today was 106 - A1c 07/15/19 was 6.2% - had e-consult visit with nephrology Adonis Housekeeper) 07/16/19  4: Hyperlipidemia- - pravastatin refilled today    Patient did not bring his medications nor a list. Each medication was verbally reviewed with the patient and he was encouraged to bring the bottles to every visit to confirm accuracy of list.  Return in 6 months or sooner for any questions/problems before then.

## 2019-12-15 ENCOUNTER — Other Ambulatory Visit: Payer: Self-pay

## 2019-12-15 ENCOUNTER — Encounter: Payer: Self-pay | Admitting: Family

## 2019-12-15 ENCOUNTER — Ambulatory Visit: Payer: Medicare Other | Attending: Family | Admitting: Family

## 2019-12-15 VITALS — BP 153/110 | HR 92 | Resp 20 | Ht 72.0 in | Wt 269.0 lb

## 2019-12-15 DIAGNOSIS — I130 Hypertensive heart and chronic kidney disease with heart failure and stage 1 through stage 4 chronic kidney disease, or unspecified chronic kidney disease: Secondary | ICD-10-CM | POA: Insufficient documentation

## 2019-12-15 DIAGNOSIS — Z95810 Presence of automatic (implantable) cardiac defibrillator: Secondary | ICD-10-CM | POA: Diagnosis not present

## 2019-12-15 DIAGNOSIS — E1121 Type 2 diabetes mellitus with diabetic nephropathy: Secondary | ICD-10-CM

## 2019-12-15 DIAGNOSIS — I2510 Atherosclerotic heart disease of native coronary artery without angina pectoris: Secondary | ICD-10-CM | POA: Diagnosis not present

## 2019-12-15 DIAGNOSIS — Z8616 Personal history of COVID-19: Secondary | ICD-10-CM | POA: Diagnosis not present

## 2019-12-15 DIAGNOSIS — Z79899 Other long term (current) drug therapy: Secondary | ICD-10-CM | POA: Diagnosis not present

## 2019-12-15 DIAGNOSIS — G40909 Epilepsy, unspecified, not intractable, without status epilepticus: Secondary | ICD-10-CM | POA: Diagnosis not present

## 2019-12-15 DIAGNOSIS — E1122 Type 2 diabetes mellitus with diabetic chronic kidney disease: Secondary | ICD-10-CM | POA: Insufficient documentation

## 2019-12-15 DIAGNOSIS — N182 Chronic kidney disease, stage 2 (mild): Secondary | ICD-10-CM | POA: Diagnosis not present

## 2019-12-15 DIAGNOSIS — I5022 Chronic systolic (congestive) heart failure: Secondary | ICD-10-CM

## 2019-12-15 DIAGNOSIS — Z8673 Personal history of transient ischemic attack (TIA), and cerebral infarction without residual deficits: Secondary | ICD-10-CM | POA: Diagnosis not present

## 2019-12-15 DIAGNOSIS — Z8249 Family history of ischemic heart disease and other diseases of the circulatory system: Secondary | ICD-10-CM | POA: Diagnosis not present

## 2019-12-15 DIAGNOSIS — I428 Other cardiomyopathies: Secondary | ICD-10-CM | POA: Insufficient documentation

## 2019-12-15 DIAGNOSIS — Z7901 Long term (current) use of anticoagulants: Secondary | ICD-10-CM | POA: Insufficient documentation

## 2019-12-15 DIAGNOSIS — Z833 Family history of diabetes mellitus: Secondary | ICD-10-CM | POA: Insufficient documentation

## 2019-12-15 DIAGNOSIS — Z8349 Family history of other endocrine, nutritional and metabolic diseases: Secondary | ICD-10-CM | POA: Diagnosis not present

## 2019-12-15 DIAGNOSIS — E785 Hyperlipidemia, unspecified: Secondary | ICD-10-CM | POA: Insufficient documentation

## 2019-12-15 DIAGNOSIS — Z87891 Personal history of nicotine dependence: Secondary | ICD-10-CM | POA: Insufficient documentation

## 2019-12-15 DIAGNOSIS — I10 Essential (primary) hypertension: Secondary | ICD-10-CM

## 2019-12-15 DIAGNOSIS — E782 Mixed hyperlipidemia: Secondary | ICD-10-CM

## 2019-12-15 LAB — GLUCOSE, CAPILLARY: Glucose-Capillary: 106 mg/dL — ABNORMAL HIGH (ref 70–99)

## 2019-12-15 MED ORDER — PRAVASTATIN SODIUM 80 MG PO TABS: 80.0000 mg | ORAL_TABLET | Freq: Every day | ORAL | 5 refills | Status: DC

## 2019-12-15 NOTE — Patient Instructions (Signed)
Continue weighing daily and call for an overnight weight gain of > 2 pounds or a weekly weight gain of >5 pounds. 

## 2019-12-26 ENCOUNTER — Ambulatory Visit (INDEPENDENT_AMBULATORY_CARE_PROVIDER_SITE_OTHER): Payer: Medicare Other | Admitting: *Deleted

## 2019-12-26 DIAGNOSIS — I5022 Chronic systolic (congestive) heart failure: Secondary | ICD-10-CM | POA: Diagnosis not present

## 2019-12-26 DIAGNOSIS — I428 Other cardiomyopathies: Secondary | ICD-10-CM

## 2019-12-26 DIAGNOSIS — M25562 Pain in left knee: Secondary | ICD-10-CM | POA: Diagnosis not present

## 2019-12-26 LAB — CUP PACEART REMOTE DEVICE CHECK
Battery Remaining Longevity: 114 mo
Battery Voltage: 3 V
Brady Statistic RV Percent Paced: 0.01 %
Date Time Interrogation Session: 20210621073729
HighPow Impedance: 71 Ohm
Implantable Lead Implant Date: 20180604
Implantable Lead Location: 753860
Implantable Lead Model: 293
Implantable Lead Serial Number: 433424
Implantable Pulse Generator Implant Date: 20180604
Lead Channel Impedance Value: 342 Ohm
Lead Channel Impedance Value: 342 Ohm
Lead Channel Pacing Threshold Amplitude: 2 V
Lead Channel Pacing Threshold Pulse Width: 0.4 ms
Lead Channel Sensing Intrinsic Amplitude: 8.75 mV
Lead Channel Sensing Intrinsic Amplitude: 8.75 mV
Lead Channel Setting Pacing Amplitude: 3.5 V
Lead Channel Setting Pacing Pulse Width: 0.8 ms
Lead Channel Setting Sensing Sensitivity: 0.3 mV

## 2019-12-27 NOTE — Progress Notes (Signed)
Remote ICD transmission.   

## 2020-01-03 ENCOUNTER — Other Ambulatory Visit: Payer: Self-pay | Admitting: Family

## 2020-01-03 DIAGNOSIS — I480 Paroxysmal atrial fibrillation: Secondary | ICD-10-CM

## 2020-01-03 DIAGNOSIS — I5022 Chronic systolic (congestive) heart failure: Secondary | ICD-10-CM

## 2020-01-19 ENCOUNTER — Other Ambulatory Visit: Payer: Medicare Other

## 2020-01-26 ENCOUNTER — Other Ambulatory Visit: Payer: Self-pay

## 2020-01-27 MED ORDER — APIXABAN 5 MG PO TABS: 5.0000 mg | ORAL_TABLET | Freq: Two times a day (BID) | ORAL | 0 refills | Status: DC

## 2020-01-30 ENCOUNTER — Other Ambulatory Visit: Payer: Self-pay | Admitting: Internal Medicine

## 2020-01-30 NOTE — Telephone Encounter (Signed)
Pt's age 49, wt 122 kg, pt's last ov w/ MA 11/26/18, last labs 1/20. Pt overdue for ov & labs. Pt has appt w/ JV tomorrow (01/31/20). Will route to her to make sure pt gets labs and see if refill is appropriate at that time.

## 2020-01-30 NOTE — Telephone Encounter (Signed)
Refill request

## 2020-01-30 NOTE — Telephone Encounter (Signed)
*  STAT* If patient is at the pharmacy, call can be transferred to refill team.   1. Which medications need to be refilled? (please list name of each medication and dose if known) Eliquis  2. Which pharmacy/location (including street and city if local pharmacy) is medication to be sent to? [facility] [address]  3. Do they need a 30 day or 90 day supply? 30 \

## 2020-01-30 NOTE — Progress Notes (Deleted)
Cardiology Office Note    Date:  01/30/2020   ID:  Kent Geralds., DOB October 24, 1970, MRN [id number]  PCP:  Smitty Cords, DO  Cardiologist:  Lorine Bears, MD  Electrophysiologist:  Sherryl Manges, MD   Chief Complaint: Follow up  History of Present Illness:   Kent Liz. is a 49 y.o. male with history of HFrEF secondary to NICM status post ICD in 2018, seizure disorder since age 7, prior CVA in 2017, PAF on Eliquis, CKD stage I, HTN, prior tobacco use quitting in 2014, remote marijuana and cocaine use, morbid obesity, and severe sleep apnea who presents for follow-up of his cardiomyopathy and A. Fib.  He was diagnosed with HFrEF in 2014 with initial EF less than 20%.  Diagnostic cath in 09/2014 showed minor luminal irregularities without evidence of obstructive CAD.  LVEDP was 30 mmHg.  He subsequently has had an ICD implanted in 2018.  He was admitted to the hospital in 05/2017 with chest pain and minimally elevated troponin.  Echo showed an EF of 10 to 15 percent with diffuse hypokinesis.  No further ischemic work-up was performed.  ICD interrogation showed no evidence of arrhythmia.  He was last seen by his primary cardiologist virtually in 11/2018 and was doing well from a cardiac perspective.  More recently, he has been followed by the [facility], last seen them on 12/15/2019 with a documented weight being down 31 pounds from his visit 8 months prior with a weight trend of 300 pounds to 269 pounds.  The note from that visit indicates the patient had Covid-19 twice.  *** BMP  Labs independently reviewed: 07/2019 - A1c 6.2 07/2018 - BUN 17, serum creatinine 1.49, potassium 3.7, TSH normal 05/2018 - magnesium 1.9, albumin 3.9, AST/ALT normal, Hgb 14.3, PLT 227  Past Medical History:  Diagnosis Date  . CHF (congestive heart failure) (HCC)   . Chronic systolic heart failure (HCC)    a. 10/2012 Echo: EF <20%, mod to sev dil LV, diast dysfxn, mildly dil  LA/RA, mod to severe MR/TR.  [name] Coronary artery disease, non-occlusive    a. 09/2014 Cath: minor luminal irregularities with severely dilated left ventricle. LVEDP 30.  [name] H/O noncompliance with medical treatment, presenting hazards to health   . History of traumatic injury of head   . Hyperlipidemia   . Hypertension   . Morbid obesity (HCC)   . NICM (nonischemic cardiomyopathy) (HCC)    a. 10/2012 Echo: EF <20%.  . Polysubstance abuse (HCC)    a. cocaine, tobacco, THC, ETOH  . Seizure disorder (HCC)   . Stroke [facility])     Past Surgical History:  Procedure Laterality Date  . CARDIAC CATHETERIZATION     [facility]  . ICD IMPLANT N/A 12/08/2016   Procedure: ICD Implant;  Surgeon: Duke Salvia, MD;  Location: [facility] INVASIVE CV LAB;  Service: Cardiovascular;  Laterality: N/A;    Current Medications: No outpatient medications have been marked as taking for the 02/07/20 encounter (Appointment) with Sondra Barges, PA-C.    Allergies:   Tomato   Social History   Socioeconomic History  . Marital status: Married    Spouse name: Not on file  . Number of children: Not on file  . Years of education: Not on file  . Highest education level: Not on file  Occupational History  . Not on file  Tobacco Use  . Smoking status: Former Smoker    Types: Cigars  Quit date: 01/02/2018    Years since quitting: 2.0  . Smokeless tobacco: Former Neurosurgeon  . Tobacco comment: smokes ~10 black and milds per week  Vaping Use  . Vaping Use: Never used  Substance and Sexual Activity  . Alcohol use: Yes    Comment: 8-9 beers a month per pt  . Drug use: No  . Sexual activity: Not Currently  Other Topics Concern  . Not on file  Social History Narrative  . Not on file   Social Determinants of Health   Financial Resource Strain:   . Difficulty of Paying Living Expenses:   Food Insecurity:   . Worried About Programme researcher, broadcasting/film/video in the Last Year:   . Barista in the Last Year:   Transportation Needs:   .  Freight forwarder (Medical):   [name] Lack of Transportation (Non-Medical):   Physical Activity:   . Days of Exercise per Week:   . Minutes of Exercise per Session:   Stress:   . Feeling of Stress :   Social Connections:   . Frequency of Communication with Friends and Family:   . Frequency of Social Gatherings with Friends and Family:   . Attends Religious Services:   . Active Member of Clubs or Organizations:   . Attends Banker Meetings:   [name] Marital Status:      Family History:  The patient's family history includes Brain cancer in his mother; Diabetes in his father and mother; Heart attack in his father and mother; Heart disease in his father; Heart failure in his father, maternal aunt, and mother; Hyperlipidemia in his father; Hypertension in his father; Sleep apnea in his brother, father, mother, and sister.  ROS:   ROS   EKGs/Labs/Other Studies Reviewed:    Studies reviewed were summarized above. The additional studies were reviewed today:  2D echo 05/2018: - Procedure narrative: Transthoracic echocardiography for left  ventricular function evaluation, for right ventricular function  evaluation, and for assessment of valvular function. Image  quality was suboptimal.  - Left ventricle: The cavity size was severely dilated. Wall  thickness was increased in a pattern of mild LVH. The estimated  ejection fraction was in the range of 10% to 15%. Diffuse  hypokinesis. Regional wall motion abnormalities cannot be  excluded. The study is not technically sufficient to allow  evaluation of LV diastolic function.  - Aortic root: The aortic root was mildly dilated.  - Right ventricle: The cavity size was normal. Systolic function  was normal.   EKG:  EKG is ordered today.  The EKG ordered today demonstrates ***  Recent Labs: No results found for requested labs within last 8760 hours.  Recent Lipid Panel    Component Value Date/Time   CHOL 163  01/02/2018 0301   CHOL 183 10/14/2012 0354   TRIG 103 01/02/2018 0301   TRIG 79 10/14/2012 0354   HDL 33 (L) 01/02/2018 0301   HDL 44 10/14/2012 0354   CHOLHDL 4.9 01/02/2018 0301   VLDL 21 01/02/2018 0301   VLDL 16 10/14/2012 0354   LDLCALC 109 (H) 01/02/2018 0301   LDLCALC 123 (H) 10/14/2012 0354    PHYSICAL EXAM:    VS:  There were no vitals taken for this visit.  BMI: There is no height or weight on file to calculate BMI.  Physical Exam  Wt Readings from Last 3 Encounters:  12/15/19 269 lb (122 kg)  11/01/19 296 lb 6.4 oz (134.4 kg)  04/13/19 [name])  300 lb 6 oz (136.2 kg)     ASSESSMENT & PLAN:   1. ***  Disposition: F/u with Dr. Kirke Corin or an APP in ***.   Medication Adjustments/Labs and Tests Ordered: Current medicines are reviewed at length with the patient today.  Concerns regarding medicines are outlined above. Medication changes, Labs and Tests ordered today are summarized above and listed in the Patient Instructions accessible in Encounters.   Signed, Eula Listen, PA-C 01/30/2020 3:32 PM     [facility] -  [address] [place], [place] [address] [phone]

## 2020-01-31 ENCOUNTER — Ambulatory Visit: Payer: Medicare Other | Admitting: Physician Assistant

## 2020-02-02 NOTE — Telephone Encounter (Signed)
Noted.  Will address at time of office visit.

## 2020-02-07 ENCOUNTER — Ambulatory Visit: Payer: Medicare Other | Admitting: Physician Assistant

## 2020-02-15 ENCOUNTER — Other Ambulatory Visit: Payer: Self-pay | Admitting: Family Medicine

## 2020-02-15 DIAGNOSIS — I5022 Chronic systolic (congestive) heart failure: Secondary | ICD-10-CM

## 2020-02-15 NOTE — Telephone Encounter (Signed)
Requested medications are due for refill today?  Yes  Requested medications are on active medication list?  Yes  Last Refill:   08/01/2019  # 45 with one refill   Future visit scheduled?  No   Notes to Clinic:  Medication failed RX refill protocol due to no labs within past 360 days.  Last labs were performed on 07/12/2018.

## 2020-03-08 NOTE — Progress Notes (Signed)
Cardiology Office Note    Date:  03/14/2020   ID:  Kent Kindred., DOB 29-Apr-1971, MRN [id number]  PCP:  Kent Hauser, DO  Cardiologist:  Kathlyn Sacramento, MD  Electrophysiologist:  Kent Axe, MD   Chief Complaint: Follow up  History of Present Illness:   Kent Stanley. is a 49 y.o. male with history of HFrEF secondary to NICM status post ICD in 2018, seizure disorder since age 10, prior CVA in 2017, PAF on Eliquis, CKD stage I, HTN, prior tobacco use quitting in 2014, remote marijuana and cocaine use, morbid obesity, and severe sleep apnea who presents for follow-up of his cardiomyopathy and A. Fib.  He was diagnosed with HFrEF in 2014 with initial EF less than 20%.  Diagnostic cath in 09/2014 showed minor luminal irregularities without evidence of obstructive CAD.  LVEDP was 30 mmHg.  He subsequently has had an ICD implanted in 2018.  He was admitted to the hospital in 05/2017 with chest pain and minimally elevated troponin.  Echo showed an EF of 10 to 15 percent with diffuse hypokinesis.  No further ischemic work-up was performed.  ICD interrogation showed no evidence of arrhythmia.  He was last seen by his primary cardiologist virtually in 11/2018 and was doing well from a cardiac perspective.  More recently, he has been followed by the [facility], last seen them on 12/15/2019 with a documented weight being down 31 pounds from his visit 8 months prior with a weight trend of 300 pounds to 269 pounds.  The note from that visit indicates the patient had Covid-19 twice.  He comes in doing well from a cardiac perspective.  Since he was last seen, he denies any chest pain, dyspnea, palpitations, dizziness, presyncope, syncope, lower extremity swelling, orthopnea, PND, early satiety.  He indicates his weight when obtained at the [facility] in 12/2019 was inaccurate as he was lifting himself up on the handrails of the scale.  That said, he does report since  he was last seen he did lose a significant amount of weight dropping down to 269 pounds though more recently has gradually gained this back.  Weight today is stable compared to his last clinic weight with EP.  He has been out of Eliquis for 3 days.  He denies any falls, hematochezia, or melena.  Otherwise, he is tolerating Lasix, Toprol-XL, and Entresto.  He watches his salt and fluid intake.  He remains very active without cardiac limitation.  No device shocks.  He is having issues with sleep and requests trazodone.   Labs independently reviewed: 07/2019 - A1c 6.2 07/2018 - BUN 17, serum creatinine 1.49, potassium 3.7, TSH normal 05/2018 - magnesium 1.9, albumin 3.9, AST/ALT normal, Hgb 14.3, PLT 227   Past Medical History:  Diagnosis Date  . CHF (congestive heart failure) ([place])   . Chronic systolic heart failure ([place])    a. 10/2012 Echo: EF <20%, mod to sev dil LV, diast dysfxn, mildly dil LA/RA, mod to severe MR/TR.  [name] Coronary artery disease, non-occlusive    a. 09/2014 Cath: minor luminal irregularities with severely dilated left ventricle. LVEDP 30.  [name] H/O noncompliance with medical treatment, presenting hazards to health   . History of traumatic injury of head   . Hyperlipidemia   . Hypertension   . Morbid obesity ([place])   . NICM (nonischemic cardiomyopathy) ([place])    a. 10/2012 Echo: EF <20%.  . Polysubstance abuse ([place])    a. cocaine,  tobacco, THC, ETOH  . Seizure disorder ([place])   . Stroke [facility])     Past Surgical History:  Procedure Laterality Date  . CARDIAC CATHETERIZATION     [facility]  . ICD IMPLANT N/A 12/08/2016   Procedure: ICD Implant;  Surgeon: Kent Sprang, MD;  Location: [place] [facility];  Service: Cardiovascular;  Laterality: N/A;    Current Medications: Current Meds  Medication Sig  . ACCU-CHEK AVIVA PLUS test strip Check blood sugar up to 1 x daily  . ACCU-CHEK SOFTCLIX LANCETS lancets Check blood sugar up to 1 x daily  . apixaban (ELIQUIS) 5 MG TABS tablet  Take 1 tablet (5 mg total) by mouth 2 (two) times daily.  . blood glucose meter kit and supplies KIT Dispense based on patient and insurance preference. Check blood glucose once daily.  . Blood Glucose Monitoring Suppl (ACCU-CHEK AVIVA PLUS) w/Device KIT Check blood sugar up to 1 x daily  . ENTRESTO 49-51 MG take 1 tablet by mouth twice a day (Patient taking differently: Take 1 tablet by mouth 2 (two) times daily. )  . fluticasone (FLONASE) 50 MCG/ACT nasal spray Place 2 sprays into both nostrils daily. Use for 4-6 weeks then stop and use seasonally or as needed.  . furosemide (LASIX) 40 MG tablet TAKE 1 TABLET BY MOUTH IN THE MORNING AND 1/2 (ONE-HALF) IN THE EVENING  . ipratropium (ATROVENT) 0.06 % nasal spray Place 2 sprays into both nostrils 4 (four) times daily. For congestion. For up to 5-7 days then stop.  . metoprolol succinate (TOPROL-XL) 25 MG 24 hr tablet Take 1 tablet by mouth once daily  . OZEMPIC, 0.25 OR 0.5 MG/DOSE, 2 MG/1.5ML SOPN INJECT 0.5 MG SUBCUTANEOUSLY ONCE A WEEK  . pravastatin (PRAVACHOL) 80 MG tablet Take 1 tablet (80 mg total) by mouth daily.  . sildenafil (REVATIO) 20 MG tablet Take 3 to 5 tablets by mouth as needed about 30 min prior to sex, stop taking if chest pain, do not take with nitroglycerin.  [name] spironolactone (ALDACTONE) 25 MG tablet Take 1/2 (one-half) tablet by mouth once daily    Allergies:   Tomato   Social History   Socioeconomic History  . Marital status: Married    Spouse name: Not on file  . Number of children: Not on file  . Years of education: Not on file  . Highest education level: Not on file  Occupational History  . Not on file  Tobacco Use  . Smoking status: Former Smoker    Types: Cigars    Quit date: 01/02/2018    Years since quitting: 2.1  . Smokeless tobacco: Former Systems developer  . Tobacco comment: smokes ~10 black and milds per week  Vaping Use  . Vaping Use: Never used  Substance and Sexual Activity  . Alcohol use: Yes    Comment:  8-9 beers a month per pt  . Drug use: No  . Sexual activity: Not Currently  Other Topics Concern  . Not on file  Social History Narrative  . Not on file   Social Determinants of Health   Financial Resource Strain:   . Difficulty of Paying Living Expenses: Not on file  Food Insecurity:   . Worried About Charity fundraiser in the Last Year: Not on file  . Ran Out of Food in the Last Year: Not on file  Transportation Needs:   . Lack of Transportation (Medical): Not on file  . Lack of Transportation (Non-Medical): Not on file  Physical Activity:   . Days of Exercise per Week: Not on file  . Minutes of Exercise per Session: Not on file  Stress:   . Feeling of Stress : Not on file  Social Connections:   . Frequency of Communication with Friends and Family: Not on file  . Frequency of Social Gatherings with Friends and Family: Not on file  . Attends Religious Services: Not on file  . Active Member of Clubs or Organizations: Not on file  . Attends Archivist Meetings: Not on file  . Marital Status: Not on file     Family History:  The patient's family history includes Brain cancer in his mother; Diabetes in his father and mother; Heart attack in his father and mother; Heart disease in his father; Heart failure in his father, maternal aunt, and mother; Hyperlipidemia in his father; Hypertension in his father; Sleep apnea in his brother, father, mother, and sister.  ROS:   Review of Systems  Constitutional: Negative for chills, diaphoresis, fever, malaise/fatigue and weight loss.  HENT: Negative for congestion.   Eyes: Negative for discharge and redness.  Respiratory: Negative for cough, sputum production, shortness of breath and wheezing.   Cardiovascular: Negative for chest pain, palpitations, orthopnea, claudication, leg swelling and PND.  Gastrointestinal: Negative for abdominal pain, blood in stool, heartburn, melena, nausea and vomiting.  Musculoskeletal: Negative  for falls and myalgias.  Skin: Negative for rash.  Neurological: Negative for dizziness, tingling, tremors, sensory change, speech change, focal weakness, loss of consciousness and weakness.  Endo/Heme/Allergies: Does not bruise/bleed easily.  Psychiatric/Behavioral: Negative for substance abuse. The patient is not nervous/anxious.   All other systems reviewed and are negative.    EKGs/Labs/Other Studies Reviewed:    Studies reviewed were summarized above. The additional studies were reviewed today:  2D echo 05/2018: - Procedure narrative: Transthoracic echocardiography for left  ventricular function evaluation, for right ventricular function  evaluation, and for assessment of valvular function. Image  quality was suboptimal.  - Left ventricle: The cavity size was severely dilated. Wall  thickness was increased in a pattern of mild LVH. The estimated  ejection fraction was in the range of 10% to 15%. Diffuse  hypokinesis. Regional wall motion abnormalities cannot be  excluded. The study is not technically sufficient to allow  evaluation of LV diastolic function.  - Aortic root: The aortic root was mildly dilated.  - Right ventricle: The cavity size was normal. Systolic function  was normal.   EKG:  EKG is ordered today.  The EKG ordered today demonstrates NSR, 86 bpm, improved nonspecific lateral st/t changes, prolonged QT, no significant changes when compared to prior  Recent Labs: No results found for requested labs within last 8760 hours.  Recent Lipid Panel    Component Value Date/Time   CHOL 163 01/02/2018 0301   CHOL 183 10/14/2012 0354   TRIG 103 01/02/2018 0301   TRIG 79 10/14/2012 0354   HDL 33 (L) 01/02/2018 0301   HDL 44 10/14/2012 0354   CHOLHDL 4.9 01/02/2018 0301   VLDL 21 01/02/2018 0301   VLDL 16 10/14/2012 0354   LDLCALC 109 (H) 01/02/2018 0301   LDLCALC 123 (H) 10/14/2012 0354    PHYSICAL EXAM:    VS:  BP 120/90 (BP Location: Left  Arm, Patient Position: Sitting, Cuff Size: Large)   Pulse 86   Ht 6' (1.829 m)   Wt (!) 301 lb (136.5 kg)   SpO2 98%   BMI 40.82 kg/m   BMI:  Body mass index is 40.82 kg/m.  Physical Exam Constitutional:      Appearance: He is well-developed.  HENT:     Head: Normocephalic and atraumatic.  Eyes:     General:        Right eye: No discharge.        Left eye: No discharge.  Neck:     Vascular: No JVD.  Cardiovascular:     Rate and Rhythm: Normal rate and regular rhythm.     Pulses: No midsystolic click and no opening snap.          Posterior tibial pulses are 2+ on the right side and 2+ on the left side.     Heart sounds: Normal heart sounds, S1 normal and S2 normal. Heart sounds not distant. No murmur heard.  No friction rub.  Pulmonary:     Effort: Pulmonary effort is normal. No respiratory distress.     Breath sounds: Normal breath sounds. No decreased breath sounds, wheezing or rales.  Chest:     Chest wall: No tenderness.  Abdominal:     General: There is no distension.     Palpations: Abdomen is soft.     Tenderness: There is no abdominal tenderness.  Musculoskeletal:     Cervical back: Normal range of motion.  Skin:    General: Skin is warm and dry.     Nails: There is no clubbing.  Neurological:     Mental Status: He is alert and oriented to person, place, and time.  Psychiatric:        Speech: Speech normal.        Behavior: Behavior normal.        Thought Content: Thought content normal.        Judgment: Judgment normal.     Wt Readings from Last 3 Encounters:  03/14/20 (!) 301 lb (136.5 kg)  12/15/19 269 lb (122 kg)  11/01/19 296 lb 6.4 oz (134.4 kg)     ASSESSMENT & PLAN:   1. HFrEF secondary to NICM: Status post ICD in 2018.  He is euvolemic and well compensated.  He remains very active without cardiac limitation.  NYHA class I symptoms.  Continue GDMT including Toprol XL, Entresto, spironolactone, and Lasix.  Will defer addition of Farxiga in the  setting of him already on Ozempic for his DM2.  After updating echo, consider reaching out to his PCP regarding potential transition from [place] to [place].  No device discharges.  He was advised to schedule an appointment with EP.  CHF education.   2. PAF: He is maintaining sinus rhythm.  Continue Toprol XL.  Continue Eliquis (has been out for 3 days) with a CHADS2VASc at least 4 (CHF, HTN, DM, vascular disease).  Check CMP and CBC.  No symptoms concerning for bleeding.   3. HTN: Blood pressure is reasonably controlled today.  Continue current medical therapy as outlined above.   4. CKD stage II: Check renal function as above.   5. Obesity with OSA: Weight loss and continued adherence to CPAP is recommended.  6. Insomnia: Recommend he follow up with his PCP.   7. HLD: Remains on pravastatin.  Check CMP and lipid panel.   Disposition: F/u with Dr. Fletcher [name] or an APP in 2 months.   Medication Adjustments/Labs and Tests Ordered: Current medicines are reviewed at length with the patient today.  Concerns regarding medicines are outlined above. Medication changes, Labs and Tests ordered today are summarized above and listed in the Patient  Instructions accessible in Encounters.   Signed, Christell Faith, PA-C 03/14/2020 10:44 AM     [facility] [address] [place], [place] [address] [phone]

## 2020-03-14 ENCOUNTER — Ambulatory Visit: Payer: Medicare Other | Admitting: Physician Assistant

## 2020-03-14 ENCOUNTER — Encounter: Payer: Self-pay | Admitting: Physician Assistant

## 2020-03-14 ENCOUNTER — Other Ambulatory Visit: Payer: Self-pay

## 2020-03-14 VITALS — BP 120/90 | HR 86 | Ht 72.0 in | Wt 301.0 lb

## 2020-03-14 DIAGNOSIS — I480 Paroxysmal atrial fibrillation: Secondary | ICD-10-CM | POA: Diagnosis not present

## 2020-03-14 DIAGNOSIS — E785 Hyperlipidemia, unspecified: Secondary | ICD-10-CM | POA: Diagnosis not present

## 2020-03-14 DIAGNOSIS — I10 Essential (primary) hypertension: Secondary | ICD-10-CM | POA: Diagnosis not present

## 2020-03-14 DIAGNOSIS — N182 Chronic kidney disease, stage 2 (mild): Secondary | ICD-10-CM

## 2020-03-14 DIAGNOSIS — E1169 Type 2 diabetes mellitus with other specified complication: Secondary | ICD-10-CM | POA: Diagnosis not present

## 2020-03-14 DIAGNOSIS — G4733 Obstructive sleep apnea (adult) (pediatric): Secondary | ICD-10-CM

## 2020-03-14 DIAGNOSIS — I5020 Unspecified systolic (congestive) heart failure: Secondary | ICD-10-CM | POA: Diagnosis not present

## 2020-03-14 DIAGNOSIS — I428 Other cardiomyopathies: Secondary | ICD-10-CM

## 2020-03-14 DIAGNOSIS — G4700 Insomnia, unspecified: Secondary | ICD-10-CM

## 2020-03-14 MED ORDER — APIXABAN 5 MG PO TABS: 5.0000 mg | ORAL_TABLET | Freq: Two times a day (BID) | ORAL | 11 refills | Status: AC

## 2020-03-14 MED ORDER — FUROSEMIDE 40 MG PO TABS
ORAL_TABLET | ORAL | 2 refills | Status: AC
Start: 2020-03-14 — End: ?

## 2020-03-14 NOTE — Patient Instructions (Signed)
Medication Instructions:  Your physician recommends that you continue on your current medications as directed. Please refer to the Current Medication list given to you today.  *If you need a refill on your cardiac medications before your next appointment, please call your pharmacy*   Lab Work: Your physician recommends that you have lab work today(CMET, CBC, lipid)  If you have labs (blood work) drawn today and your tests are completely normal, you will receive your results only by: [name] MyChart Message (if you have MyChart) OR . A paper copy in the mail If you have any lab test that is abnormal or we need to change your treatment, we will call you to review the results.   Testing/Procedures: 1- Echo  Please return to [facility] on ______________ at _______________ AM/PM for an Echocardiogram. Your physician has requested that you have an echocardiogram. Echocardiography is a painless test that uses sound waves to create images of your heart. It provides your doctor with information about the size and shape of your heart and how well your heart's chambers and valves are working. This procedure takes approximately one hour. There are no restrictions for this procedure. Please note; depending on visual quality an IV may need to be placed.     Follow-Up: At [facility], you and your health needs are our priority.  As part of our continuing mission to provide you with exceptional heart care, we have created designated Provider Care Teams.  These Care Teams include your primary Cardiologist (physician) and Advanced Practice Providers (APPs -  Physician Assistants and Nurse Practitioners) who all work together to provide you with the care you need, when you need it.  We recommend signing up for the patient portal called "MyChart".  Sign up information is provided on this After Visit Summary.  MyChart is used to connect with patients for Virtual Visits (Telemedicine).  Patients are  able to view lab/test results, encounter notes, upcoming appointments, etc.  Non-urgent messages can be sent to your provider as well.   To learn more about what you can do with MyChart, go to [email or web address].    Your next appointment:   2 month(s)  The format for your next appointment:   In Person  Provider:    You may see Lorine Bears, MD or Eula Listen, PA-C  Other Instructions Please set up appt with Dr. Graciela Husbands for regular follow up

## 2020-03-15 ENCOUNTER — Telehealth: Payer: Self-pay

## 2020-03-15 DIAGNOSIS — I5020 Unspecified systolic (congestive) heart failure: Secondary | ICD-10-CM

## 2020-03-15 LAB — COMPREHENSIVE METABOLIC PANEL WITH GFR
ALT: 17 [IU]/L (ref 0–44)
AST: 23 [IU]/L (ref 0–40)
Albumin/Globulin Ratio: 1.4 (ref 1.2–2.2)
Albumin: 4.2 g/dL (ref 3.8–4.9)
Alkaline Phosphatase: 47 [IU]/L — ABNORMAL LOW (ref 48–121)
BUN/Creatinine Ratio: 10 (ref 9–20)
BUN: 14 mg/dL (ref 6–24)
Bilirubin Total: 0.5 mg/dL (ref 0.0–1.2)
CO2: 26 mmol/L (ref 20–29)
Calcium: 9.2 mg/dL (ref 8.7–10.2)
Chloride: 101 mmol/L (ref 96–106)
Creatinine, Ser: 1.37 mg/dL — ABNORMAL HIGH (ref 0.76–1.27)
GFR calc Af Amer: 69 mL/min/{1.73_m2}
GFR calc non Af Amer: 59 mL/min/{1.73_m2} — ABNORMAL LOW
Globulin, Total: 3 g/dL (ref 1.5–4.5)
Glucose: 136 mg/dL — ABNORMAL HIGH (ref 65–99)
Potassium: 4 mmol/L (ref 3.5–5.2)
Sodium: 141 mmol/L (ref 134–144)
Total Protein: 7.2 g/dL (ref 6.0–8.5)

## 2020-03-15 LAB — LIPID PANEL
Chol/HDL Ratio: 4.3 ratio (ref 0.0–5.0)
Cholesterol, Total: 219 mg/dL — ABNORMAL HIGH (ref 100–199)
HDL: 51 mg/dL
LDL Chol Calc (NIH): 149 mg/dL — ABNORMAL HIGH (ref 0–99)
Triglycerides: 107 mg/dL (ref 0–149)
VLDL Cholesterol Cal: 19 mg/dL (ref 5–40)

## 2020-03-15 LAB — CBC
Hematocrit: 39.5 % (ref 37.5–51.0)
Hemoglobin: 13.8 g/dL (ref 13.0–17.7)
MCH: 34.5 pg — ABNORMAL HIGH (ref 26.6–33.0)
MCHC: 34.9 g/dL (ref 31.5–35.7)
MCV: 99 fL — ABNORMAL HIGH (ref 79–97)
Platelets: 218 10*3/uL (ref 150–450)
RBC: 4 x10E6/uL — ABNORMAL LOW (ref 4.14–5.80)
RDW: 14.5 % (ref 11.6–15.4)
WBC: 6.8 10*3/uL (ref 3.4–10.8)

## 2020-03-15 MED ORDER — ATORVASTATIN CALCIUM 40 MG PO TABS: 40.0000 mg | ORAL_TABLET | Freq: Every day | ORAL | 3 refills | Status: AC

## 2020-03-15 NOTE — Telephone Encounter (Signed)
-----   Message from Sondra Barges, PA-C sent at 03/15/2020  7:29 AM EDT ----- Random glucose is mildly elevated with known diabetesRenal function is mildly elevated though at his approximate baseline and stablePotassium at goalLiver function normal Blood count normal LDL elevated  Recommendations:Stop pravastatinStart Lipitor 40 mg dailyRecheck fasting lipid and liver in approximately 8 weeks

## 2020-03-15 NOTE — Telephone Encounter (Signed)
Attempted to call patient. LMTCB 03/15/2020   

## 2020-03-15 NOTE — Telephone Encounter (Signed)
Call to patient to review labs.    Pt verbalized understanding and has no further questions at this time.    Advised pt to call for any further questions or concerns.  Orders placed as advised.   

## 2020-03-26 ENCOUNTER — Ambulatory Visit (INDEPENDENT_AMBULATORY_CARE_PROVIDER_SITE_OTHER): Payer: Medicare Other | Admitting: *Deleted

## 2020-03-26 DIAGNOSIS — I428 Other cardiomyopathies: Secondary | ICD-10-CM

## 2020-03-26 DIAGNOSIS — I5022 Chronic systolic (congestive) heart failure: Secondary | ICD-10-CM | POA: Diagnosis not present

## 2020-03-27 LAB — CUP PACEART REMOTE DEVICE CHECK
Battery Remaining Longevity: 108 mo
Battery Voltage: 3 V
Brady Statistic RV Percent Paced: 0.01 %
Date Time Interrogation Session: 20210920012504
HighPow Impedance: 71 Ohm
Implantable Lead Implant Date: 20180604
Implantable Lead Location: 753860
Implantable Lead Model: 293
Implantable Lead Serial Number: 433424
Implantable Pulse Generator Implant Date: 20180604
Lead Channel Impedance Value: 342 Ohm
Lead Channel Impedance Value: 342 Ohm
Lead Channel Pacing Threshold Amplitude: 1.875 V
Lead Channel Pacing Threshold Pulse Width: 0.4 ms
Lead Channel Sensing Intrinsic Amplitude: 8.625 mV
Lead Channel Sensing Intrinsic Amplitude: 8.625 mV
Lead Channel Setting Pacing Amplitude: 3.5 V
Lead Channel Setting Pacing Pulse Width: 0.8 ms
Lead Channel Setting Sensing Sensitivity: 0.3 mV

## 2020-03-28 NOTE — Progress Notes (Signed)
Remote ICD transmission.   

## 2020-04-06 ENCOUNTER — Other Ambulatory Visit: Payer: Self-pay

## 2020-04-06 ENCOUNTER — Ambulatory Visit: Payer: Medicare Other

## 2020-04-28 NOTE — Progress Notes (Deleted)
Cardiology Office Note    Date:  04/28/2020   ID:  Kent Stanley., DOB 1970-12-17, MRN [id number]  PCP:  Smitty Cords, DO  Cardiologist:  Lorine Bears, MD  Electrophysiologist:  Sherryl Manges, MD   Chief Complaint: Follow up  History of Present Illness:   Kent Stanley. is a 49 y.o. male with history of HFrEF secondary to NICM status post ICD in 2018, seizure disorder since age 1, prior CVA in 2017, PAF on Eliquis, CKD stage I, HTN, prior tobacco use quitting in 2014, remote marijuana and cocaine use, morbid obesity, and severe sleep apnea who presents for follow-up of his cardiomyopathy and A. Fib.  He was diagnosed with HFrEF in 2014 with initial EF less than 20%.  Diagnostic cath in 09/2014 showed minor luminal irregularities without evidence of obstructive CAD.  LVEDP was 30 mmHg.  He subsequently has had an ICD implanted in 2018.  He was admitted to the hospital in 05/2017 with chest pain and minimally elevated troponin.  Echo showed an EF of 10 to 15 percent with diffuse hypokinesis.  No further ischemic work-up was performed.  ICD interrogation showed no evidence of arrhythmia.  He was last seen by his primary cardiologist virtually in 11/2018 and was doing well from a cardiac perspective.  More recently, he has been followed by the [facility], last seen them on 12/15/2019 with a documented weight being down 31 pounds from his visit 8 months prior with a weight trend of 300 pounds to 269 pounds.  The note from that visit indicates the patient had Covid-19 twice.  He was last seen in the office on 03/14/2020 and doing well from a cardiac perspective. He remains very active. He has been out of Eliquis for 3 days. His weight was stable. Addition of Marcelline Deist was deferred given he was already on Ozempic with recommendation to consider transitioning to [place] pending updated echo which has not yet been completed.  ***   Labs independently  reviewed: 03/2020 - TC 219, TG 107, HDL 51, LDL 149, Hgb 13.8, PLT 218, BUN 14, serum creatinine 1.37, potassium 4.0, albumin 4.2, AST/ALT normal 07/2019 - A1c 6.2 07/2018 - TSH normal 05/2018 - magnesium 1.9   Past Medical History:  Diagnosis Date  . CHF (congestive heart failure) (HCC)   . Chronic systolic heart failure (HCC)    a. 10/2012 Echo: EF <20%, mod to sev dil LV, diast dysfxn, mildly dil LA/RA, mod to severe MR/TR.  [name] Coronary artery disease, non-occlusive    a. 09/2014 Cath: minor luminal irregularities with severely dilated left ventricle. LVEDP 30.  [name] H/O noncompliance with medical treatment, presenting hazards to health   . History of traumatic injury of head   . Hyperlipidemia   . Hypertension   . Morbid obesity (HCC)   . NICM (nonischemic cardiomyopathy) (HCC)    a. 10/2012 Echo: EF <20%.  . Polysubstance abuse (HCC)    a. cocaine, tobacco, THC, ETOH  . Seizure disorder (HCC)   . Stroke [facility])     Past Surgical History:  Procedure Laterality Date  . CARDIAC CATHETERIZATION     [facility]  . ICD IMPLANT N/A 12/08/2016   Procedure: ICD Implant;  Surgeon: Duke Salvia, MD;  Location: [facility] INVASIVE CV LAB;  Service: Cardiovascular;  Laterality: N/A;    Current Medications: No outpatient medications have been marked as taking for the 05/08/20 encounter (Appointment) with Sondra Barges, PA-C.  Allergies:   Tomato   Social History   Socioeconomic History  . Marital status: Married    Spouse name: Not on file  . Number of children: Not on file  . Years of education: Not on file  . Highest education level: Not on file  Occupational History  . Not on file  Tobacco Use  . Smoking status: Former Smoker    Types: Cigars    Quit date: 01/02/2018    Years since quitting: 2.3  . Smokeless tobacco: Former Neurosurgeon  . Tobacco comment: smokes ~10 black and milds per week  Vaping Use  . Vaping Use: Never used  Substance and Sexual Activity  . Alcohol use: Yes    Comment: 8-9  beers a month per pt  . Drug use: No  . Sexual activity: Not Currently  Other Topics Concern  . Not on file  Social History Narrative  . Not on file   Social Determinants of Health   Financial Resource Strain:   . Difficulty of Paying Living Expenses: Not on file  Food Insecurity:   . Worried About Programme researcher, broadcasting/film/video in the Last Year: Not on file  . Ran Out of Food in the Last Year: Not on file  Transportation Needs:   . Lack of Transportation (Medical): Not on file  . Lack of Transportation (Non-Medical): Not on file  Physical Activity:   . Days of Exercise per Week: Not on file  . Minutes of Exercise per Session: Not on file  Stress:   . Feeling of Stress : Not on file  Social Connections:   . Frequency of Communication with Friends and Family: Not on file  . Frequency of Social Gatherings with Friends and Family: Not on file  . Attends Religious Services: Not on file  . Active Member of Clubs or Organizations: Not on file  . Attends Banker Meetings: Not on file  . Marital Status: Not on file     Family History:  The patient's family history includes Brain cancer in his mother; Diabetes in his father and mother; Heart attack in his father and mother; Heart disease in his father; Heart failure in his father, maternal aunt, and mother; Hyperlipidemia in his father; Hypertension in his father; Sleep apnea in his brother, father, mother, and sister.  ROS:   ROS   EKGs/Labs/Other Studies Reviewed:    Studies reviewed were summarized above. The additional studies were reviewed today:  2D echo 05/2018: - Procedure narrative: Transthoracic echocardiography for left  ventricular function evaluation, for right ventricular function  evaluation, and for assessment of valvular function. Image  quality was suboptimal.  - Left ventricle: The cavity size was severely dilated. Wall  thickness was increased in a pattern of mild LVH. The estimated  ejection  fraction was in the range of 10% to 15%. Diffuse  hypokinesis. Regional wall motion abnormalities cannot be  excluded. The study is not technically sufficient to allow  evaluation of LV diastolic function.  - Aortic root: The aortic root was mildly dilated.  - Right ventricle: The cavity size was normal. Systolic function  was normal.   EKG:  EKG is ordered today.  The EKG ordered today demonstrates ***  Recent Labs: 03/14/2020: ALT 17; BUN 14; Creatinine, Ser 1.37; Hemoglobin 13.8; Platelets 218; Potassium 4.0; Sodium 141  Recent Lipid Panel    Component Value Date/Time   CHOL 219 (H) 03/14/2020 1107   CHOL 183 10/14/2012 0354   TRIG 107 03/14/2020  1107   TRIG 79 10/14/2012 0354   HDL 51 03/14/2020 1107   HDL 44 10/14/2012 0354   CHOLHDL 4.3 03/14/2020 1107   CHOLHDL 4.9 01/02/2018 0301   VLDL 21 01/02/2018 0301   VLDL 16 10/14/2012 0354   LDLCALC 149 (H) 03/14/2020 1107   LDLCALC 123 (H) 10/14/2012 0354    PHYSICAL EXAM:    VS:  There were no vitals taken for this visit.  BMI: There is no height or weight on file to calculate BMI.  Physical Exam  Wt Readings from Last 3 Encounters:  03/14/20 (!) 301 lb (136.5 kg)  12/15/19 269 lb (122 kg)  11/01/19 296 lb 6.4 oz (134.4 kg)     ASSESSMENT & PLAN:   1. ***  Disposition: F/u with Dr. Kirke Corin or an APP in ***, and EP as directed.   Medication Adjustments/Labs and Tests Ordered: Current medicines are reviewed at length with the patient today.  Concerns regarding medicines are outlined above. Medication changes, Labs and Tests ordered today are summarized above and listed in the Patient Instructions accessible in Encounters.   Signed, Eula Listen, PA-C 04/28/2020 9:59 AM     [facility] [place] [address] [place], [place] [address] [phone]

## 2020-04-29 NOTE — Progress Notes (Deleted)
Patient ID: Kent Stanley., male    DOB: [birth year], 49 y.o.   MRN: [id number]   Kent Stanley is a 49 y/o male with a history of stroke, CKD stage 2, CAD, hyperlipidemia, HTN, morbid obesity, polysubstance abuse in the past, prior tobacco use, seizures, DM, ICD implantation and chronic heart failure.  Echo report from 05/18/18 reviewed and showed an EF of 10-15%. Echo done 01/02/18 reviewed and showed an EF of 20-25% along with moderate Kent, trivial TR and PA pressure of 22 mm Hg. Echo done 10/24/16 which showed an EF of 15-20%. EF has declined from previous echo which was done 03/28/16 which showed an EF of 20-25% without regurgitation. Last catheterization was done March 2016.  Has not been admitted or been in the ED in the last 6 months.   He presents today for a follow-up visit with a chief complaint of   Past Medical History:  Diagnosis Date  . CHF (congestive heart failure) (HCC)   . Chronic systolic heart failure (HCC)    a. 10/2012 Echo: EF <20%, mod to sev dil LV, diast dysfxn, mildly dil LA/RA, mod to severe Kent/TR.  [name] Coronary artery disease, non-occlusive    a. 09/2014 Cath: minor luminal irregularities with severely dilated left ventricle. LVEDP 30.  [name] H/O noncompliance with medical treatment, presenting hazards to health   . History of traumatic injury of head   . Hyperlipidemia   . Hypertension   . Morbid obesity (HCC)   . NICM (nonischemic cardiomyopathy) (HCC)    a. 10/2012 Echo: EF <20%.  . Polysubstance abuse (HCC)    a. cocaine, tobacco, THC, ETOH  . Seizure disorder (HCC)   . Stroke [facility])    Past Surgical History:  Procedure Laterality Date  . CARDIAC CATHETERIZATION     [facility]  . ICD IMPLANT N/A 12/08/2016   Procedure: ICD Implant;  Surgeon: Duke Salvia, MD;  Location: [facility] INVASIVE CV LAB;  Service: Cardiovascular;  Laterality: N/A;   Family History  Problem Relation Age of Onset  . Diabetes Mother   . Brain cancer Mother   . Heart attack Mother   . Sleep  apnea Mother   . Heart failure Mother   . Hypertension Father   . Diabetes Father   . Heart disease Father   . Hyperlipidemia Father   . Heart attack Father   . Heart failure Father   . Sleep apnea Father   . Sleep apnea Sister   . Sleep apnea Brother   . Heart failure Maternal Aunt    Social History   Tobacco Use  . Smoking status: Former Smoker    Types: Cigars    Quit date: 01/02/2018    Years since quitting: 2.3  . Smokeless tobacco: Former Neurosurgeon  . Tobacco comment: smokes ~10 black and milds per week  Substance Use Topics  . Alcohol use: Yes    Comment: 8-9 beers a month per pt   Allergies  Allergen Reactions  . Tomato Rash     Review of Systems  Constitutional: Negative for appetite change and fatigue.  HENT: Negative for congestion, postnasal drip and sore throat.   Eyes: Negative.   Respiratory: Negative for cough, chest tightness, shortness of breath and wheezing.   Cardiovascular: Positive for chest pain ("chronic angina") and leg swelling (left ankle). Negative for palpitations.  Gastrointestinal: Negative for abdominal distention and abdominal pain.  Endocrine: Negative.   Genitourinary: Negative.   Musculoskeletal: Positive for arthralgias (left knee). Negative  for back pain and neck pain.  Skin: Negative.   Allergic/Immunologic: Negative.   Neurological: Negative for dizziness, light-headedness and numbness.  Hematological: Negative for adenopathy. Does not bruise/bleed easily.  Psychiatric/Behavioral: Negative for dysphoric mood and sleep disturbance (uses CPAP). The patient is not nervous/anxious.       Physical Exam Vitals and nursing note reviewed.  Constitutional:      Appearance: He is well-developed.  HENT:     Head: Normocephalic and atraumatic.  Neck:     Vascular: No JVD.  Cardiovascular:     Rate and Rhythm: Normal rate and regular rhythm.  Pulmonary:     Effort: Pulmonary effort is normal.     Breath sounds: No wheezing or rales.   Abdominal:     General: There is no distension.     Palpations: Abdomen is soft.     Tenderness: There is no abdominal tenderness.  Musculoskeletal:        General: No tenderness.     Cervical back: Normal range of motion and neck supple.     Comments: Boot on left foot  Skin:    General: Skin is warm and dry.  Neurological:     Mental Status: He is alert and oriented to person, place, and time.  Psychiatric:        Behavior: Behavior normal.        Thought Content: Thought content normal.     Assessment & Plan:  1: Chronic heart failure with reduced ejection fraction- - NYHA Class I - euvolemic today - weighing daily; reminded to call for an overnight weight gain of >2 pounds or a weekly weight gain of >5 pounds. - weight 269 pounds from last visit 4+ months ago (has had COVID twice) - Using Mrs. Dash and trying to follow a 2000mg  sodium diet - echo scheduled for 01/19/20; depending on results, may need to titrate medications - saw cardiology (Dunn) 03/14/20 - saw pulmonologist 05/14/20) 01/25/18 - saw EP 01/27/18) 03/02/18 - BNP 02/12/16 was 403.8  2: HTN- - BP  - saw PCP 04/13/16) 11/01/19 - BMP 03/14/20 reviewed and showed sodium 141, potassium 4.0, creatinine 1.37 and GFR 69  3: Diabetes- - fasting glucose in clinic today was  - A1c 07/15/19 was 6.2% - had e-consult visit with nephrology 09/12/19) 07/16/19    Patient did not bring his medications nor a list. Each medication was verbally reviewed with the patient and he was encouraged to bring the bottles to every visit to confirm accuracy of list.

## 2020-04-30 ENCOUNTER — Ambulatory Visit: Payer: Medicare Other | Admitting: Family

## 2020-05-04 ENCOUNTER — Other Ambulatory Visit: Payer: Self-pay | Admitting: Family Medicine

## 2020-05-04 DIAGNOSIS — N5201 Erectile dysfunction due to arterial insufficiency: Secondary | ICD-10-CM

## 2020-05-07 ENCOUNTER — Other Ambulatory Visit: Payer: Self-pay

## 2020-05-07 ENCOUNTER — Encounter: Payer: Self-pay | Admitting: Family

## 2020-05-07 ENCOUNTER — Ambulatory Visit: Payer: Medicare Other | Attending: Family | Admitting: Family

## 2020-05-07 VITALS — BP 117/91 | HR 48 | Resp 20 | Ht 72.0 in | Wt 306.4 lb

## 2020-05-07 DIAGNOSIS — Z79899 Other long term (current) drug therapy: Secondary | ICD-10-CM | POA: Diagnosis not present

## 2020-05-07 DIAGNOSIS — G40909 Epilepsy, unspecified, not intractable, without status epilepticus: Secondary | ICD-10-CM | POA: Diagnosis not present

## 2020-05-07 DIAGNOSIS — Z95810 Presence of automatic (implantable) cardiac defibrillator: Secondary | ICD-10-CM | POA: Diagnosis not present

## 2020-05-07 DIAGNOSIS — Z87891 Personal history of nicotine dependence: Secondary | ICD-10-CM | POA: Insufficient documentation

## 2020-05-07 DIAGNOSIS — E785 Hyperlipidemia, unspecified: Secondary | ICD-10-CM | POA: Insufficient documentation

## 2020-05-07 DIAGNOSIS — Z7901 Long term (current) use of anticoagulants: Secondary | ICD-10-CM | POA: Diagnosis not present

## 2020-05-07 DIAGNOSIS — I5022 Chronic systolic (congestive) heart failure: Secondary | ICD-10-CM

## 2020-05-07 DIAGNOSIS — Z8673 Personal history of transient ischemic attack (TIA), and cerebral infarction without residual deficits: Secondary | ICD-10-CM | POA: Insufficient documentation

## 2020-05-07 DIAGNOSIS — I130 Hypertensive heart and chronic kidney disease with heart failure and stage 1 through stage 4 chronic kidney disease, or unspecified chronic kidney disease: Secondary | ICD-10-CM | POA: Diagnosis not present

## 2020-05-07 DIAGNOSIS — F1911 Other psychoactive substance abuse, in remission: Secondary | ICD-10-CM | POA: Diagnosis not present

## 2020-05-07 DIAGNOSIS — Z6841 Body Mass Index (BMI) 40.0 and over, adult: Secondary | ICD-10-CM | POA: Diagnosis not present

## 2020-05-07 DIAGNOSIS — N182 Chronic kidney disease, stage 2 (mild): Secondary | ICD-10-CM | POA: Diagnosis not present

## 2020-05-07 DIAGNOSIS — I2510 Atherosclerotic heart disease of native coronary artery without angina pectoris: Secondary | ICD-10-CM | POA: Insufficient documentation

## 2020-05-07 DIAGNOSIS — E1169 Type 2 diabetes mellitus with other specified complication: Secondary | ICD-10-CM

## 2020-05-07 DIAGNOSIS — Z8249 Family history of ischemic heart disease and other diseases of the circulatory system: Secondary | ICD-10-CM | POA: Diagnosis not present

## 2020-05-07 DIAGNOSIS — E1122 Type 2 diabetes mellitus with diabetic chronic kidney disease: Secondary | ICD-10-CM | POA: Insufficient documentation

## 2020-05-07 DIAGNOSIS — I10 Essential (primary) hypertension: Secondary | ICD-10-CM

## 2020-05-07 LAB — GLUCOSE, CAPILLARY: Glucose-Capillary: 141 mg/dL — ABNORMAL HIGH (ref 70–99)

## 2020-05-07 NOTE — Patient Instructions (Signed)
Continue weighing daily and call for an overnight weight gain of > 2 pounds or a weekly weight gain of >5 pounds. 

## 2020-05-07 NOTE — Progress Notes (Signed)
Patient ID: Kent Kindred., male    DOB: 1971-01-19, 49 y.o.   MRN: [id number]   Kent Stanley is a 49 y/o male with a history of stroke, CKD stage 2, CAD, hyperlipidemia, HTN, morbid obesity, polysubstance abuse in the past, prior tobacco use, seizures, DM, ICD implantation and chronic heart failure.  Echo report from 05/18/18 reviewed and showed an EF of 10-15%. Echo done 01/02/18 reviewed and showed an EF of 20-25% along with moderate Kent, trivial TR and PA pressure of 22 mm Hg. Echo done 10/24/16 which showed an EF of 15-20%. EF has declined from previous echo which was done 03/28/16 which showed an EF of 20-25% without regurgitation. Last catheterization was done March 2016.  Has not been admitted or been in the ED in the last 6 months.   He presents today for a follow-up visit with a chief complaint of minimal swelling around the left ankle. Says that this has been present for several years. He has associated angina and right knee pain along with this. Denies any difficulty sleeping, dizziness, abdominal distention, palpitations, shortness of breath, cough or fatigue.   Noted gradual weight gain and difficulty in walking long due to right knee pain. Has been out of entresto for ~ 3 days. Wife has been diagnosed with covid so he has to wait to R/S his echo. No longer taking ozempic and says it was replaced with something else but he can't remember the name of the replacement medication.   Past Medical History:  Diagnosis Date  . CHF (congestive heart failure) ([place])   . Chronic systolic heart failure ([place])    a. 10/2012 Echo: EF <20%, mod to sev dil LV, diast dysfxn, mildly dil LA/RA, mod to severe Kent/TR.  [name] Coronary artery disease, non-occlusive    a. 09/2014 Cath: minor luminal irregularities with severely dilated left ventricle. LVEDP 30.  [name] H/O noncompliance with medical treatment, presenting hazards to health   . History of traumatic injury of head   . Hyperlipidemia   . Hypertension   .  Morbid obesity ([place])   . NICM (nonischemic cardiomyopathy) ([place])    a. 10/2012 Echo: EF <20%.  . Polysubstance abuse ([place])    a. cocaine, tobacco, THC, ETOH  . Seizure disorder ([place])   . Stroke [facility])    Past Surgical History:  Procedure Laterality Date  . CARDIAC CATHETERIZATION     [facility]  . ICD IMPLANT N/A 12/08/2016   Procedure: ICD Implant;  Surgeon: Deboraha Sprang, MD;  Location: [place] CV LAB;  Service: Cardiovascular;  Laterality: N/A;   Family History  Problem Relation Age of Onset  . Diabetes Mother   . Brain cancer Mother   . Heart attack Mother   . Sleep apnea Mother   . Heart failure Mother   . Hypertension Father   . Diabetes Father   . Heart disease Father   . Hyperlipidemia Father   . Heart attack Father   . Heart failure Father   . Sleep apnea Father   . Sleep apnea Sister   . Sleep apnea Brother   . Heart failure Maternal Aunt    Social History   Tobacco Use  . Smoking status: Former Smoker    Types: Cigars    Quit date: 01/02/2018    Years since quitting: 2.3  . Smokeless tobacco: Former Systems developer  . Tobacco comment: smokes ~10 black and milds per week  Substance Use Topics  . Alcohol use: Yes  Comment: 8-9 beers a month per pt   Allergies  Allergen Reactions  . Tomato Rash   Prior to Admission medications   Medication Sig Start Date End Date Taking? Authorizing Provider  ACCU-CHEK AVIVA PLUS test strip Check blood sugar up to 1 x daily 01/18/18  Yes Karamalegos, [name], DO  ACCU-CHEK SOFTCLIX LANCETS lancets Check blood sugar up to 1 x daily 01/18/18  Yes Karamalegos, [name], DO  apixaban (ELIQUIS) 5 MG TABS tablet Take 1 tablet (5 mg total) by mouth 2 (two) times daily. 03/14/20  Yes Dunn, Areta Haber, PA-C  atorvastatin (LIPITOR) 40 MG tablet Take 1 tablet (40 mg total) by mouth daily. 03/15/20 06/13/20 Yes Dunn, Areta Haber, PA-C  blood glucose meter kit and supplies KIT Dispense based on patient and insurance preference. Check blood glucose once  daily. 08/21/15  Yes [name], [name], NP  Blood Glucose Monitoring Suppl (ACCU-CHEK AVIVA PLUS) w/Device KIT Check blood sugar up to 1 x daily 01/18/18  Yes Karamalegos, Alexander J, DO  ENTRESTO 49-51 MG take 1 tablet by mouth twice a day Patient taking differently: Take 1 tablet by mouth 2 (two) times daily.  11/25/16  Yes Macsen Nuttall A, FNP  fluticasone (FLONASE) 50 MCG/ACT nasal spray Place 2 sprays into both nostrils daily. Use for 4-6 weeks then stop and use seasonally or as needed. 11/01/19  Yes Karamalegos, [name], DO  furosemide (LASIX) 40 MG tablet TAKE 1 TABLET BY MOUTH IN THE MORNING AND 1/2 (ONE-HALF) IN THE EVENING 03/14/20  Yes Dunn, Areta Haber, PA-C  ipratropium (ATROVENT) 0.06 % nasal spray Place 2 sprays into both nostrils 4 (four) times daily. For congestion. For up to 5-7 days then stop. 11/01/19  Yes Karamalegos, [name], DO  metoprolol succinate (TOPROL-XL) 25 MG 24 hr tablet Take 1 tablet by mouth once daily 01/03/20  Yes Delinda Malan A, FNP  sildenafil (REVATIO) 20 MG tablet TAKE 3 - 5 TABLETS BY MOUTH AS NEEDED ABOUT 30 MINUTES PRIOR TO SEX.  STOP TAKING IF CHEST PAIN.  DO NOT TAKE WITH NITROGLYCERIN 05/04/20  Yes Karamalegos, [name], DO  spironolactone (ALDACTONE) 25 MG tablet Take 1/2 (one-half) tablet by mouth once daily 02/16/20  Yes Cheikh Bramble A, FNP  OZEMPIC, 0.25 OR 0.5 MG/DOSE, 2 MG/1.5ML SOPN INJECT 0.5 MG SUBCUTANEOUSLY ONCE A WEEK Patient not taking: Reported on 05/07/2020 11/07/19   Olin Hauser, DO    Review of Systems  Constitutional: Negative for appetite change and fatigue.  HENT: Negative for congestion, postnasal drip and sore throat.   Eyes: Negative.   Respiratory: Negative for cough, chest tightness, shortness of breath and wheezing.   Cardiovascular: Positive for chest pain ("chronic angina") and leg swelling (left ankle). Negative for palpitations.  Gastrointestinal: Negative for abdominal distention and abdominal pain.   Endocrine: Negative.   Genitourinary: Negative.   Musculoskeletal: Positive for arthralgias (right knee). Negative for back pain and neck pain.  Skin: Negative.   Allergic/Immunologic: Negative.   Neurological: Negative for dizziness, light-headedness and numbness.  Hematological: Negative for adenopathy. Does not bruise/bleed easily.  Psychiatric/Behavioral: Negative for dysphoric mood and sleep disturbance (uses CPAP). The patient is not nervous/anxious.    Vitals:   05/07/20 0931  BP: (!) 117/91  Pulse: (!) 48  Resp: 20  SpO2: 97%  Weight: (!) 306 lb 6 oz (139 kg)  Height: 6' (1.829 m)   Wt Readings from Last 3 Encounters:  05/07/20 (!) 306 lb 6 oz (139 kg)  03/14/20 [name])  301 lb (136.5 kg)  12/15/19 269 lb (122 kg)   Lab Results  Component Value Date   CREATININE 1.37 (H) 03/14/2020   CREATININE 1.49 (H) 07/12/2018   CREATININE 1.56 (H) 05/18/2018    Physical Exam Vitals and nursing note reviewed.  Constitutional:      Appearance: He is well-developed.  HENT:     Head: Normocephalic and atraumatic.  Neck:     Vascular: No JVD.  Cardiovascular:     Rate and Rhythm: Regular rhythm. Bradycardia present.  Pulmonary:     Effort: Pulmonary effort is normal. No respiratory distress.     Breath sounds: No wheezing or rales.  Abdominal:     General: There is no distension.     Palpations: Abdomen is soft.     Tenderness: There is no abdominal tenderness.  Musculoskeletal:        General: No tenderness.     Cervical back: Normal range of motion and neck supple.     Right lower leg: No edema.     Left lower leg: No edema.  Skin:    General: Skin is warm and dry.  Neurological:     Mental Status: He is alert and oriented to person, place, and time.  Psychiatric:        Behavior: Behavior normal.        Thought Content: Thought content normal.     Assessment & Plan:  1: Chronic heart failure with reduced ejection fraction- - NYHA Class I - euvolemic today -  weighing daily; reminded to call for an overnight weight gain of >2 pounds or a weekly weight gain of >5 pounds. - weight up 5 pounds from cardiology visit 2 months ago; question accuracy of our last weight here - Using Mrs. Dash and trying to follow a $Remove'2000mg'lmzPnzN$  sodium diet - echo had been scheduled but patient had a cough and it was cancelled; wife recently diagnosed w/covid so he has to wait to r/s it - saw cardiology (Dunn) 03/14/20 - 1 month samples of entresto 49/$RemoveBefore'51mg'[id number]$  provided to patient and renewal for novartis patient assistance program filled out today - saw pulmonologist Ashby Dawes) 01/25/18 - saw EP [name]) 03/02/18 - BNP 02/12/16 was 403.8 - has not received flu vaccine or covid vaccines  2: HTN- - BP looks good today - saw PCP Jerene Pitch) 11/01/19 - BMP 03/14/20 reviewed and showed sodium 141, potassium 4.0, creatinine 1.37 and GFR 69  3: Diabetes- - fasting glucose in clinic today was 141 - A1c 07/15/19 was 6.2% - had e-consult visit with nephrology Adonis Housekeeper) 07/16/19    Patient did not bring his medications nor a list. Each medication was verbally reviewed with the patient and he was encouraged to bring the bottles to every visit to confirm accuracy of list.  Return in 6 months or sooner for any questions/problems before then.

## 2020-05-08 ENCOUNTER — Ambulatory Visit: Payer: Medicare Other | Admitting: Physician Assistant

## 2020-05-10 DIAGNOSIS — N5201 Erectile dysfunction due to arterial insufficiency: Secondary | ICD-10-CM

## 2020-06-04 MED ORDER — SILDENAFIL CITRATE 20 MG PO TABS: ORAL_TABLET | ORAL | 1 refills | Status: DC

## 2020-06-04 NOTE — Addendum Note (Signed)
Addended by: Smitty Cords on: 06/04/2020 09:05 AM   Modules accepted: Orders

## 2020-06-05 ENCOUNTER — Encounter: Payer: Medicare Other | Admitting: Internal Medicine

## 2020-06-13 ENCOUNTER — Other Ambulatory Visit: Payer: Medicare Other

## 2020-06-14 ENCOUNTER — Ambulatory Visit: Payer: Medicare Other | Admitting: Family

## 2020-06-18 ENCOUNTER — Telehealth: Payer: Self-pay | Admitting: Family

## 2020-06-19 ENCOUNTER — Other Ambulatory Visit: Payer: Self-pay | Admitting: Family

## 2020-06-19 MED ORDER — ENTRESTO 49-51 MG PO TABS: 1.0000 | ORAL_TABLET | Freq: Two times a day (BID) | ORAL | 5 refills | Status: AC

## 2020-06-19 NOTE — Progress Notes (Signed)
entresto refilled

## 2020-06-19 NOTE — Telephone Encounter (Signed)
Spoke to patient to notify him his novartis patient assistance application was denied due to prescription drug coverage that exist.    Joice Lofts, NT

## 2020-06-26 ENCOUNTER — Ambulatory Visit (INDEPENDENT_AMBULATORY_CARE_PROVIDER_SITE_OTHER): Payer: Medicare Other

## 2020-06-26 DIAGNOSIS — I428 Other cardiomyopathies: Secondary | ICD-10-CM

## 2020-06-26 DIAGNOSIS — I5022 Chronic systolic (congestive) heart failure: Secondary | ICD-10-CM

## 2020-06-26 LAB — CUP PACEART REMOTE DEVICE CHECK
Battery Remaining Longevity: 101 mo
Battery Voltage: 2.99 V
Brady Statistic RV Percent Paced: 0.01 %
Date Time Interrogation Session: 20211221033524
HighPow Impedance: 80 Ohm
Implantable Lead Implant Date: 20180604
Implantable Lead Location: 753860
Implantable Lead Model: 293
Implantable Lead Serial Number: 433424
Implantable Pulse Generator Implant Date: 20180604
Lead Channel Impedance Value: 342 Ohm
Lead Channel Impedance Value: 361 Ohm
Lead Channel Pacing Threshold Amplitude: 2 V
Lead Channel Pacing Threshold Pulse Width: 0.4 ms
Lead Channel Sensing Intrinsic Amplitude: 9.25 mV
Lead Channel Sensing Intrinsic Amplitude: 9.25 mV
Lead Channel Setting Pacing Amplitude: 3.5 V
Lead Channel Setting Pacing Pulse Width: 0.8 ms
Lead Channel Setting Sensing Sensitivity: 0.3 mV

## 2020-07-10 NOTE — Progress Notes (Signed)
Remote ICD transmission.   

## 2020-07-17 ENCOUNTER — Ambulatory Visit: Payer: Self-pay | Admitting: Family Medicine

## 2020-07-17 ENCOUNTER — Other Ambulatory Visit: Payer: Self-pay | Admitting: Family Medicine

## 2020-07-17 ENCOUNTER — Other Ambulatory Visit: Payer: Medicare Other

## 2020-07-17 DIAGNOSIS — N5201 Erectile dysfunction due to arterial insufficiency: Secondary | ICD-10-CM

## 2020-07-17 MED ORDER — SILDENAFIL CITRATE 20 MG PO TABS: ORAL_TABLET | ORAL | 0 refills | Status: AC

## 2020-07-17 NOTE — Telephone Encounter (Signed)
Medication Refill - Medication: sildenafil (REVATIO) 20 MG tablet    Has the patient contacted their pharmacy? Yes.    (Agent: If yes, when and what did the pharmacy advise?) Contact PCP stating Rx is in need of authorization  Preferred Pharmacy (with phone number or street name):  [facility] [facility] [place] - [address] Phone:  [phone]  Fax:  [phone]       Agent: Please be advised that RX refills may take up to 3 business days. We ask that you follow-up with your pharmacy.

## 2020-07-25 ENCOUNTER — Ambulatory Visit: Payer: Self-pay | Admitting: Family Medicine

## 2020-09-17 ENCOUNTER — Telehealth: Payer: Self-pay | Admitting: Family

## 2020-09-17 NOTE — Telephone Encounter (Signed)
Spoke with patient to confirm his status with novartis patient assistance application which was denied due to out of pocket cost and patient stated he was approved through his new insurance and is getting it directly from pharmacy.   Graylon Amory, NT

## 2020-09-24 ENCOUNTER — Telehealth: Payer: Self-pay | Admitting: Family Medicine

## 2020-09-24 NOTE — Telephone Encounter (Signed)
Copied from CRM [id number]. Topic: Medicare AWV >> Sep 24, 2020 10:38 AM Claudette Laws R wrote: Reason for CRM: Left message for patient to call back and schedule the Medicare Annual Wellness Visit (AWV) virtually or by telephone.  Last AWV 01/05/2019   Please schedule at anytime with [facility].  40 minute appointment  Any questions, please call me at [phone]

## 2020-09-25 ENCOUNTER — Ambulatory Visit (INDEPENDENT_AMBULATORY_CARE_PROVIDER_SITE_OTHER): Payer: Medicare Other

## 2020-09-25 DIAGNOSIS — I428 Other cardiomyopathies: Secondary | ICD-10-CM

## 2020-09-25 LAB — CUP PACEART REMOTE DEVICE CHECK
Battery Remaining Longevity: 95 mo
Battery Voltage: 2.99 V
Brady Statistic RV Percent Paced: 0.01 %
Date Time Interrogation Session: 20220322033325
HighPow Impedance: 83 Ohm
Implantable Lead Implant Date: 20180604
Implantable Lead Location: 753860
Implantable Lead Model: 293
Implantable Lead Serial Number: 433424
Implantable Pulse Generator Implant Date: 20180604
Lead Channel Impedance Value: 342 Ohm
Lead Channel Impedance Value: 342 Ohm
Lead Channel Pacing Threshold Amplitude: 1.625 V
Lead Channel Pacing Threshold Pulse Width: 0.4 ms
Lead Channel Sensing Intrinsic Amplitude: 13.375 mV
Lead Channel Sensing Intrinsic Amplitude: 13.375 mV
Lead Channel Setting Pacing Amplitude: 3.5 V
Lead Channel Setting Pacing Pulse Width: 0.8 ms
Lead Channel Setting Sensing Sensitivity: 0.3 mV

## 2020-10-04 NOTE — Progress Notes (Signed)
Remote ICD transmission.   

## 2020-11-05 ENCOUNTER — Ambulatory Visit: Payer: Medicare Other | Admitting: Family

## 2020-11-25 ENCOUNTER — Other Ambulatory Visit: Payer: Self-pay | Admitting: Family Medicine

## 2020-11-25 DIAGNOSIS — N5201 Erectile dysfunction due to arterial insufficiency: Secondary | ICD-10-CM

## 2020-11-25 NOTE — Telephone Encounter (Signed)
Pt has not been in office since 11/01/19. Sent pt message to call and make appointment for f/u. High cancellation rate. Last RF 07/17/20 #90 RF due: yes Active med list: yes Future appt: no  Routing to office to review.

## 2020-11-27 ENCOUNTER — Encounter: Payer: Medicare Other | Admitting: Internal Medicine

## 2020-11-27 ENCOUNTER — Other Ambulatory Visit: Payer: Self-pay | Admitting: Family Medicine

## 2020-11-27 DIAGNOSIS — N5201 Erectile dysfunction due to arterial insufficiency: Secondary | ICD-10-CM

## 2020-12-04 DIAGNOSIS — R944 Abnormal results of kidney function studies: Secondary | ICD-10-CM | POA: Diagnosis not present

## 2020-12-04 DIAGNOSIS — S39012A Strain of muscle, fascia and tendon of lower back, initial encounter: Secondary | ICD-10-CM | POA: Diagnosis not present

## 2020-12-04 DIAGNOSIS — Y999 Unspecified external cause status: Secondary | ICD-10-CM | POA: Diagnosis not present

## 2020-12-04 DIAGNOSIS — E1122 Type 2 diabetes mellitus with diabetic chronic kidney disease: Secondary | ICD-10-CM | POA: Diagnosis not present

## 2020-12-04 DIAGNOSIS — R0902 Hypoxemia: Secondary | ICD-10-CM | POA: Diagnosis not present

## 2020-12-04 DIAGNOSIS — N189 Chronic kidney disease, unspecified: Secondary | ICD-10-CM | POA: Diagnosis not present

## 2020-12-04 DIAGNOSIS — Z87891 Personal history of nicotine dependence: Secondary | ICD-10-CM | POA: Diagnosis not present

## 2020-12-04 DIAGNOSIS — M549 Dorsalgia, unspecified: Secondary | ICD-10-CM | POA: Diagnosis not present

## 2020-12-04 DIAGNOSIS — R109 Unspecified abdominal pain: Secondary | ICD-10-CM | POA: Diagnosis not present

## 2020-12-04 DIAGNOSIS — I129 Hypertensive chronic kidney disease with stage 1 through stage 4 chronic kidney disease, or unspecified chronic kidney disease: Secondary | ICD-10-CM | POA: Diagnosis not present

## 2020-12-04 DIAGNOSIS — I517 Cardiomegaly: Secondary | ICD-10-CM | POA: Diagnosis not present

## 2020-12-04 DIAGNOSIS — I2510 Atherosclerotic heart disease of native coronary artery without angina pectoris: Secondary | ICD-10-CM | POA: Diagnosis not present

## 2021-02-27 ENCOUNTER — Encounter: Payer: Self-pay | Admitting: Family Medicine

## 2021-03-26 ENCOUNTER — Ambulatory Visit (INDEPENDENT_AMBULATORY_CARE_PROVIDER_SITE_OTHER): Payer: Medicare Other

## 2021-03-26 DIAGNOSIS — I428 Other cardiomyopathies: Secondary | ICD-10-CM

## 2021-03-26 LAB — CUP PACEART REMOTE DEVICE CHECK
Battery Remaining Longevity: 81 mo
Battery Voltage: 2.99 V
Brady Statistic RV Percent Paced: 0.01 %
Date Time Interrogation Session: 20220920001806
HighPow Impedance: 76 Ohm
Implantable Lead Implant Date: 20180604
Implantable Lead Location: 753860
Implantable Lead Model: 293
Implantable Lead Serial Number: 433424
Implantable Pulse Generator Implant Date: 20180604
Lead Channel Impedance Value: 342 Ohm
Lead Channel Impedance Value: 342 Ohm
Lead Channel Pacing Threshold Amplitude: 1.5 V
Lead Channel Pacing Threshold Pulse Width: 0.4 ms
Lead Channel Sensing Intrinsic Amplitude: 9.875 mV
Lead Channel Sensing Intrinsic Amplitude: 9.875 mV
Lead Channel Setting Pacing Amplitude: 3.5 V
Lead Channel Setting Pacing Pulse Width: 0.8 ms
Lead Channel Setting Sensing Sensitivity: 0.3 mV

## 2021-04-02 NOTE — Progress Notes (Signed)
Remote ICD transmission.   

## 2021-08-27 ENCOUNTER — Other Ambulatory Visit: Payer: Self-pay | Admitting: Gastroenterology

## 2021-08-27 DIAGNOSIS — Z1211 Encounter for screening for malignant neoplasm of colon: Secondary | ICD-10-CM

## 2021-09-24 ENCOUNTER — Ambulatory Visit (INDEPENDENT_AMBULATORY_CARE_PROVIDER_SITE_OTHER): Payer: Medicare Other

## 2021-09-24 DIAGNOSIS — I428 Other cardiomyopathies: Secondary | ICD-10-CM | POA: Diagnosis not present

## 2021-09-25 LAB — CUP PACEART REMOTE DEVICE CHECK
Battery Remaining Longevity: 62 mo
Battery Voltage: 2.98 V
Brady Statistic RV Percent Paced: 0.01 %
Date Time Interrogation Session: 20230321033527
HighPow Impedance: 107 Ohm
Implantable Lead Implant Date: 20180604
Implantable Lead Location: 753860
Implantable Lead Model: 293
Implantable Lead Serial Number: 433424
Implantable Pulse Generator Implant Date: 20180604
Lead Channel Impedance Value: 342 Ohm
Lead Channel Impedance Value: 342 Ohm
Lead Channel Pacing Threshold Amplitude: 1.625 V
Lead Channel Pacing Threshold Pulse Width: 0.4 ms
Lead Channel Sensing Intrinsic Amplitude: 11.75 mV
Lead Channel Sensing Intrinsic Amplitude: 11.75 mV
Lead Channel Setting Pacing Amplitude: 3.5 V
Lead Channel Setting Pacing Pulse Width: 0.8 ms
Lead Channel Setting Sensing Sensitivity: 0.3 mV

## 2021-10-09 NOTE — Progress Notes (Signed)
Remote ICD transmission.   

## 2021-12-24 ENCOUNTER — Ambulatory Visit (INDEPENDENT_AMBULATORY_CARE_PROVIDER_SITE_OTHER): Payer: Medicare Other

## 2021-12-24 DIAGNOSIS — I428 Other cardiomyopathies: Secondary | ICD-10-CM | POA: Diagnosis not present

## 2021-12-24 LAB — CUP PACEART REMOTE DEVICE CHECK
Battery Remaining Longevity: 56 mo
Battery Voltage: 2.97 V
Brady Statistic RV Percent Paced: 0.01 %
Date Time Interrogation Session: 20230620082827
HighPow Impedance: 109 Ohm
Implantable Lead Implant Date: 20180604
Implantable Lead Location: 753860
Implantable Lead Model: 293
Implantable Lead Serial Number: 433424
Implantable Pulse Generator Implant Date: 20180604
Lead Channel Impedance Value: 342 Ohm
Lead Channel Impedance Value: 342 Ohm
Lead Channel Pacing Threshold Amplitude: 1.625 V
Lead Channel Pacing Threshold Pulse Width: 0.4 ms
Lead Channel Sensing Intrinsic Amplitude: 7.75 mV
Lead Channel Sensing Intrinsic Amplitude: 7.75 mV
Lead Channel Setting Pacing Amplitude: 3.5 V
Lead Channel Setting Pacing Pulse Width: 0.8 ms
Lead Channel Setting Sensing Sensitivity: 0.3 mV

## 2022-01-09 ENCOUNTER — Telehealth: Payer: Self-pay | Admitting: Internal Medicine

## 2022-01-09 NOTE — Progress Notes (Signed)
Remote ICD transmission.   

## 2022-01-09 NOTE — Telephone Encounter (Signed)
3 attempts to schedule fu appt from recall list.   Deleting recall.   

## 2022-03-25 ENCOUNTER — Ambulatory Visit (INDEPENDENT_AMBULATORY_CARE_PROVIDER_SITE_OTHER): Payer: Medicare Other

## 2022-03-25 DIAGNOSIS — I428 Other cardiomyopathies: Secondary | ICD-10-CM | POA: Diagnosis not present

## 2022-03-25 LAB — CUP PACEART REMOTE DEVICE CHECK
Battery Remaining Longevity: 48 mo
Battery Voltage: 2.98 V
Brady Statistic RV Percent Paced: 0.01 %
Date Time Interrogation Session: 20230919043823
HighPow Impedance: 120 Ohm
Implantable Lead Implant Date: 20180604
Implantable Lead Location: 753860
Implantable Lead Model: 293
Implantable Lead Serial Number: 433424
Implantable Pulse Generator Implant Date: 20180604
Lead Channel Impedance Value: 361 Ohm
Lead Channel Impedance Value: 361 Ohm
Lead Channel Pacing Threshold Amplitude: 2.375 V
Lead Channel Pacing Threshold Pulse Width: 0.4 ms
Lead Channel Sensing Intrinsic Amplitude: 9.125 mV
Lead Channel Sensing Intrinsic Amplitude: 9.125 mV
Lead Channel Setting Pacing Amplitude: 3.5 V
Lead Channel Setting Pacing Pulse Width: 0.8 ms
Lead Channel Setting Sensing Sensitivity: 0.3 mV

## 2022-04-08 NOTE — Progress Notes (Signed)
Remote ICD transmission.
